# Patient Record
Sex: Female | Born: 1940 | Race: White | Hispanic: No | Marital: Married | Smoking: Former smoker
Health system: Southern US, Community
[De-identification: ages and names within clinical notes are randomized; demographics above are authoritative.]

## PROBLEM LIST (undated history)

## (undated) DIAGNOSIS — S065XAA Traumatic subdural hemorrhage with loss of consciousness status unknown, initial encounter: Secondary | ICD-10-CM

## (undated) DIAGNOSIS — E785 Hyperlipidemia, unspecified: Secondary | ICD-10-CM

## (undated) DIAGNOSIS — I4821 Permanent atrial fibrillation: Secondary | ICD-10-CM

## (undated) DIAGNOSIS — A0472 Enterocolitis due to Clostridium difficile, not specified as recurrent: Secondary | ICD-10-CM

## (undated) DIAGNOSIS — C329 Malignant neoplasm of larynx, unspecified: Secondary | ICD-10-CM

## (undated) DIAGNOSIS — C069 Malignant neoplasm of mouth, unspecified: Secondary | ICD-10-CM

## (undated) DIAGNOSIS — J189 Pneumonia, unspecified organism: Secondary | ICD-10-CM

## (undated) DIAGNOSIS — I639 Cerebral infarction, unspecified: Secondary | ICD-10-CM

## (undated) DIAGNOSIS — F1011 Alcohol abuse, in remission: Secondary | ICD-10-CM

## (undated) DIAGNOSIS — Z87440 Personal history of urinary (tract) infections: Secondary | ICD-10-CM

## (undated) DIAGNOSIS — I10 Essential (primary) hypertension: Secondary | ICD-10-CM

## (undated) DIAGNOSIS — I679 Cerebrovascular disease, unspecified: Secondary | ICD-10-CM

## (undated) DIAGNOSIS — F0390 Unspecified dementia without behavioral disturbance: Secondary | ICD-10-CM

## (undated) DIAGNOSIS — N870 Mild cervical dysplasia: Secondary | ICD-10-CM

## (undated) DIAGNOSIS — M2550 Pain in unspecified joint: Secondary | ICD-10-CM

## (undated) DIAGNOSIS — C109 Malignant neoplasm of oropharynx, unspecified: Secondary | ICD-10-CM

## (undated) DIAGNOSIS — Q438 Other specified congenital malformations of intestine: Secondary | ICD-10-CM

## (undated) DIAGNOSIS — N390 Urinary tract infection, site not specified: Secondary | ICD-10-CM

## (undated) DIAGNOSIS — C50919 Malignant neoplasm of unspecified site of unspecified female breast: Secondary | ICD-10-CM

## (undated) DIAGNOSIS — L0390 Cellulitis, unspecified: Secondary | ICD-10-CM

## (undated) DIAGNOSIS — R269 Unspecified abnormalities of gait and mobility: Secondary | ICD-10-CM

## (undated) DIAGNOSIS — F419 Anxiety disorder, unspecified: Secondary | ICD-10-CM

## (undated) DIAGNOSIS — K5730 Diverticulosis of large intestine without perforation or abscess without bleeding: Secondary | ICD-10-CM

## (undated) DIAGNOSIS — E039 Hypothyroidism, unspecified: Secondary | ICD-10-CM

## (undated) DIAGNOSIS — Z860100 Personal history of colon polyps, unspecified: Secondary | ICD-10-CM

## (undated) DIAGNOSIS — S065X9A Traumatic subdural hemorrhage with loss of consciousness of unspecified duration, initial encounter: Secondary | ICD-10-CM

## (undated) DIAGNOSIS — R569 Unspecified convulsions: Secondary | ICD-10-CM

## (undated) DIAGNOSIS — M2540 Effusion, unspecified joint: Secondary | ICD-10-CM

## (undated) DIAGNOSIS — M1990 Unspecified osteoarthritis, unspecified site: Secondary | ICD-10-CM

## (undated) DIAGNOSIS — I739 Peripheral vascular disease, unspecified: Secondary | ICD-10-CM

## (undated) HISTORY — DX: Essential (primary) hypertension: I10

## (undated) HISTORY — DX: Personal history of colon polyps, unspecified: Z86.0100

## (undated) HISTORY — DX: Diverticulosis of large intestine without perforation or abscess without bleeding: K57.30

## (undated) HISTORY — PX: SUBDURAL HEMATOMA EVACUATION VIA CRANIOTOMY: SUR319

## (undated) HISTORY — PX: BREAST LUMPECTOMY: SHX2

## (undated) HISTORY — PX: OTHER SURGICAL HISTORY: SHX169

## (undated) HISTORY — PX: MULTIPLE TOOTH EXTRACTIONS: SHX2053

## (undated) HISTORY — DX: Unspecified abnormalities of gait and mobility: R26.9

## (undated) HISTORY — DX: Mild cervical dysplasia: N87.0

## (undated) HISTORY — DX: Urinary tract infection, site not specified: N39.0

## (undated) HISTORY — DX: Traumatic subdural hemorrhage with loss of consciousness of unspecified duration, initial encounter: S06.5X9A

## (undated) HISTORY — DX: Hyperlipidemia, unspecified: E78.5

## (undated) HISTORY — DX: Hypothyroidism, unspecified: E03.9

## (undated) HISTORY — DX: Alcohol abuse, in remission: F10.11

## (undated) HISTORY — DX: Malignant neoplasm of unspecified site of unspecified female breast: C50.919

## (undated) HISTORY — PX: COLONOSCOPY: SHX174

## (undated) HISTORY — DX: Personal history of colonic polyps: Z86.010

## (undated) HISTORY — DX: Cerebrovascular disease, unspecified: I67.9

## (undated) HISTORY — DX: Other specified congenital malformations of intestine: Q43.8

## (undated) HISTORY — PX: CAROTID ENDARTERECTOMY: SUR193

## (undated) HISTORY — DX: Permanent atrial fibrillation: I48.21

## (undated) HISTORY — DX: Cerebral infarction, unspecified: I63.9

## (undated) HISTORY — DX: Malignant neoplasm of oropharynx, unspecified: C10.9

## (undated) HISTORY — PX: TUBAL LIGATION: SHX77

## (undated) HISTORY — DX: Traumatic subdural hemorrhage with loss of consciousness status unknown, initial encounter: S06.5XAA

## (undated) HISTORY — DX: Cellulitis, unspecified: L03.90

## (undated) HISTORY — DX: Malignant neoplasm of mouth, unspecified: C06.9

## (undated) HISTORY — PX: MASTECTOMY: SHX3

## (undated) HISTORY — DX: Pneumonia, unspecified organism: J18.9

## (undated) HISTORY — DX: Enterocolitis due to Clostridium difficile, not specified as recurrent: A04.72

## (undated) HISTORY — PX: CATARACT EXTRACTION, BILATERAL: SHX1313

## (undated) HISTORY — DX: Unspecified convulsions: R56.9

## (undated) HISTORY — DX: Peripheral vascular disease, unspecified: I73.9

---

## 1998-02-05 ENCOUNTER — Other Ambulatory Visit: Admission: RE | Admit: 1998-02-05 | Discharge: 1998-02-05 | Payer: Self-pay | Admitting: General Surgery

## 1998-02-25 ENCOUNTER — Encounter: Payer: Self-pay | Admitting: General Surgery

## 1998-02-27 ENCOUNTER — Ambulatory Visit (HOSPITAL_COMMUNITY): Admission: RE | Admit: 1998-02-27 | Discharge: 1998-02-28 | Payer: Self-pay | Admitting: General Surgery

## 1998-04-08 ENCOUNTER — Inpatient Hospital Stay (HOSPITAL_COMMUNITY): Admission: RE | Admit: 1998-04-08 | Discharge: 1998-04-09 | Payer: Self-pay | Admitting: General Surgery

## 1998-07-04 DIAGNOSIS — I679 Cerebrovascular disease, unspecified: Secondary | ICD-10-CM

## 1998-07-04 DIAGNOSIS — I639 Cerebral infarction, unspecified: Secondary | ICD-10-CM

## 1998-07-04 HISTORY — DX: Cerebrovascular disease, unspecified: I67.9

## 1998-07-04 HISTORY — DX: Cerebral infarction, unspecified: I63.9

## 1998-12-09 ENCOUNTER — Other Ambulatory Visit: Admission: RE | Admit: 1998-12-09 | Discharge: 1998-12-09 | Payer: Self-pay | Admitting: Specialist

## 1999-01-09 ENCOUNTER — Inpatient Hospital Stay (HOSPITAL_COMMUNITY): Admission: EM | Admit: 1999-01-09 | Discharge: 1999-01-25 | Payer: Self-pay | Admitting: Emergency Medicine

## 1999-01-09 ENCOUNTER — Encounter: Payer: Self-pay | Admitting: Emergency Medicine

## 1999-01-10 ENCOUNTER — Encounter: Payer: Self-pay | Admitting: Neurology

## 1999-01-19 ENCOUNTER — Encounter: Payer: Self-pay | Admitting: *Deleted

## 1999-05-21 ENCOUNTER — Encounter: Admission: RE | Admit: 1999-05-21 | Discharge: 1999-05-21 | Payer: Self-pay | Admitting: Hematology and Oncology

## 1999-05-21 ENCOUNTER — Encounter: Payer: Self-pay | Admitting: Hematology and Oncology

## 1999-07-06 ENCOUNTER — Other Ambulatory Visit: Admission: RE | Admit: 1999-07-06 | Discharge: 1999-07-06 | Payer: Self-pay | Admitting: *Deleted

## 1999-08-06 ENCOUNTER — Other Ambulatory Visit: Admission: RE | Admit: 1999-08-06 | Discharge: 1999-08-06 | Payer: Self-pay | Admitting: *Deleted

## 2000-07-13 ENCOUNTER — Other Ambulatory Visit: Admission: RE | Admit: 2000-07-13 | Discharge: 2000-07-13 | Payer: Self-pay | Admitting: *Deleted

## 2001-07-18 ENCOUNTER — Other Ambulatory Visit: Admission: RE | Admit: 2001-07-18 | Discharge: 2001-07-18 | Payer: Self-pay | Admitting: *Deleted

## 2001-12-19 ENCOUNTER — Encounter: Admission: RE | Admit: 2001-12-19 | Discharge: 2002-02-04 | Payer: Self-pay | Admitting: *Deleted

## 2002-07-24 ENCOUNTER — Other Ambulatory Visit: Admission: RE | Admit: 2002-07-24 | Discharge: 2002-07-24 | Payer: Self-pay | Admitting: *Deleted

## 2002-09-09 ENCOUNTER — Other Ambulatory Visit: Admission: RE | Admit: 2002-09-09 | Discharge: 2002-09-09 | Payer: Self-pay | Admitting: *Deleted

## 2003-01-20 ENCOUNTER — Other Ambulatory Visit: Admission: RE | Admit: 2003-01-20 | Discharge: 2003-01-20 | Payer: Self-pay | Admitting: *Deleted

## 2003-06-11 ENCOUNTER — Other Ambulatory Visit: Admission: RE | Admit: 2003-06-11 | Discharge: 2003-06-11 | Payer: Self-pay | Admitting: *Deleted

## 2003-08-20 ENCOUNTER — Other Ambulatory Visit: Admission: RE | Admit: 2003-08-20 | Discharge: 2003-08-20 | Payer: Self-pay | Admitting: *Deleted

## 2003-11-26 ENCOUNTER — Other Ambulatory Visit: Admission: RE | Admit: 2003-11-26 | Discharge: 2003-11-26 | Payer: Self-pay | Admitting: *Deleted

## 2004-03-10 ENCOUNTER — Other Ambulatory Visit: Admission: RE | Admit: 2004-03-10 | Discharge: 2004-03-10 | Payer: Self-pay | Admitting: *Deleted

## 2004-06-30 ENCOUNTER — Other Ambulatory Visit: Admission: RE | Admit: 2004-06-30 | Discharge: 2004-06-30 | Payer: Self-pay | Admitting: *Deleted

## 2004-07-07 ENCOUNTER — Encounter: Admission: RE | Admit: 2004-07-07 | Discharge: 2004-07-07 | Payer: Self-pay | Admitting: Internal Medicine

## 2004-11-09 ENCOUNTER — Ambulatory Visit: Admission: RE | Admit: 2004-11-09 | Discharge: 2005-02-07 | Payer: Self-pay | Admitting: Radiation Oncology

## 2004-11-17 ENCOUNTER — Encounter: Admission: AD | Admit: 2004-11-17 | Discharge: 2004-11-17 | Payer: Self-pay | Admitting: Dentistry

## 2004-11-17 ENCOUNTER — Ambulatory Visit: Payer: Self-pay | Admitting: Dentistry

## 2004-11-22 ENCOUNTER — Ambulatory Visit: Payer: Self-pay | Admitting: Dentistry

## 2004-12-03 ENCOUNTER — Ambulatory Visit: Payer: Self-pay | Admitting: Dentistry

## 2005-01-05 ENCOUNTER — Other Ambulatory Visit: Admission: RE | Admit: 2005-01-05 | Discharge: 2005-01-05 | Payer: Self-pay | Admitting: *Deleted

## 2005-01-06 ENCOUNTER — Ambulatory Visit: Payer: Self-pay | Admitting: Dentistry

## 2005-03-02 ENCOUNTER — Ambulatory Visit: Payer: Self-pay | Admitting: Dentistry

## 2005-03-02 ENCOUNTER — Ambulatory Visit: Admission: RE | Admit: 2005-03-02 | Discharge: 2005-04-05 | Payer: Self-pay | Admitting: Radiation Oncology

## 2005-03-02 ENCOUNTER — Ambulatory Visit: Admission: RE | Admit: 2005-03-02 | Discharge: 2005-03-08 | Payer: Self-pay | Admitting: Radiation Oncology

## 2005-07-06 ENCOUNTER — Encounter: Admission: RE | Admit: 2005-07-06 | Discharge: 2005-07-06 | Payer: Self-pay | Admitting: General Surgery

## 2005-07-26 ENCOUNTER — Other Ambulatory Visit: Admission: RE | Admit: 2005-07-26 | Discharge: 2005-07-26 | Payer: Self-pay | Admitting: *Deleted

## 2005-07-29 ENCOUNTER — Ambulatory Visit: Payer: Self-pay | Admitting: Oncology

## 2005-09-06 ENCOUNTER — Ambulatory Visit (HOSPITAL_COMMUNITY): Admission: RE | Admit: 2005-09-06 | Discharge: 2005-09-06 | Payer: Self-pay | Admitting: General Surgery

## 2005-09-06 ENCOUNTER — Encounter (INDEPENDENT_AMBULATORY_CARE_PROVIDER_SITE_OTHER): Payer: Self-pay | Admitting: Specialist

## 2005-09-14 ENCOUNTER — Ambulatory Visit: Payer: Self-pay | Admitting: Oncology

## 2005-09-22 ENCOUNTER — Ambulatory Visit: Admission: RE | Admit: 2005-09-22 | Discharge: 2005-12-08 | Payer: Self-pay | Admitting: Radiation Oncology

## 2005-11-02 ENCOUNTER — Encounter: Payer: Self-pay | Admitting: General Surgery

## 2005-12-15 ENCOUNTER — Ambulatory Visit: Payer: Self-pay | Admitting: Oncology

## 2006-06-19 ENCOUNTER — Ambulatory Visit: Payer: Self-pay | Admitting: Oncology

## 2006-07-31 ENCOUNTER — Other Ambulatory Visit: Admission: RE | Admit: 2006-07-31 | Discharge: 2006-07-31 | Payer: Self-pay | Admitting: *Deleted

## 2006-09-27 ENCOUNTER — Ambulatory Visit: Payer: Self-pay | Admitting: Internal Medicine

## 2006-10-16 ENCOUNTER — Ambulatory Visit: Payer: Self-pay | Admitting: Internal Medicine

## 2006-10-16 LAB — CONVERTED CEMR LAB
Free T4: 1.1 ng/dL (ref 0.6–1.6)
Phenytoin Lvl: 9.2 ug/mL — ABNORMAL LOW (ref 10.0–20.0)
TSH: 1.42 microintl units/mL (ref 0.35–5.50)

## 2006-10-23 ENCOUNTER — Ambulatory Visit: Payer: Self-pay | Admitting: Gastroenterology

## 2006-10-30 ENCOUNTER — Ambulatory Visit: Payer: Self-pay | Admitting: Gastroenterology

## 2006-10-30 ENCOUNTER — Encounter (INDEPENDENT_AMBULATORY_CARE_PROVIDER_SITE_OTHER): Payer: Self-pay | Admitting: *Deleted

## 2006-11-07 ENCOUNTER — Ambulatory Visit: Payer: Self-pay | Admitting: Vascular Surgery

## 2007-01-31 ENCOUNTER — Other Ambulatory Visit: Admission: RE | Admit: 2007-01-31 | Discharge: 2007-01-31 | Payer: Self-pay | Admitting: *Deleted

## 2007-03-20 ENCOUNTER — Ambulatory Visit: Payer: Self-pay | Admitting: Oncology

## 2007-04-04 ENCOUNTER — Ambulatory Visit: Payer: Self-pay | Admitting: Internal Medicine

## 2007-05-15 ENCOUNTER — Encounter: Payer: Self-pay | Admitting: Internal Medicine

## 2007-06-23 ENCOUNTER — Encounter: Payer: Self-pay | Admitting: *Deleted

## 2007-06-23 DIAGNOSIS — Z8679 Personal history of other diseases of the circulatory system: Secondary | ICD-10-CM | POA: Insufficient documentation

## 2007-06-23 DIAGNOSIS — I10 Essential (primary) hypertension: Secondary | ICD-10-CM

## 2007-06-23 DIAGNOSIS — E785 Hyperlipidemia, unspecified: Secondary | ICD-10-CM

## 2007-06-23 DIAGNOSIS — Z8669 Personal history of other diseases of the nervous system and sense organs: Secondary | ICD-10-CM | POA: Insufficient documentation

## 2007-06-23 DIAGNOSIS — F1021 Alcohol dependence, in remission: Secondary | ICD-10-CM | POA: Insufficient documentation

## 2007-06-23 DIAGNOSIS — Z9849 Cataract extraction status, unspecified eye: Secondary | ICD-10-CM

## 2007-06-23 DIAGNOSIS — R279 Unspecified lack of coordination: Secondary | ICD-10-CM | POA: Insufficient documentation

## 2007-06-23 DIAGNOSIS — E039 Hypothyroidism, unspecified: Secondary | ICD-10-CM

## 2007-06-23 DIAGNOSIS — Z9889 Other specified postprocedural states: Secondary | ICD-10-CM | POA: Insufficient documentation

## 2007-06-23 DIAGNOSIS — R32 Unspecified urinary incontinence: Secondary | ICD-10-CM

## 2007-06-23 DIAGNOSIS — Z860100 Personal history of colon polyps, unspecified: Secondary | ICD-10-CM | POA: Insufficient documentation

## 2007-06-23 DIAGNOSIS — Z853 Personal history of malignant neoplasm of breast: Secondary | ICD-10-CM

## 2007-06-23 DIAGNOSIS — N870 Mild cervical dysplasia: Secondary | ICD-10-CM | POA: Insufficient documentation

## 2007-06-23 DIAGNOSIS — I739 Peripheral vascular disease, unspecified: Secondary | ICD-10-CM

## 2007-06-23 DIAGNOSIS — Z87448 Personal history of other diseases of urinary system: Secondary | ICD-10-CM | POA: Insufficient documentation

## 2007-07-11 ENCOUNTER — Telehealth: Payer: Self-pay | Admitting: Internal Medicine

## 2007-07-18 ENCOUNTER — Encounter: Payer: Self-pay | Admitting: Internal Medicine

## 2007-07-19 ENCOUNTER — Encounter: Payer: Self-pay | Admitting: Internal Medicine

## 2007-07-23 ENCOUNTER — Encounter: Payer: Self-pay | Admitting: Internal Medicine

## 2007-07-24 ENCOUNTER — Telehealth: Payer: Self-pay | Admitting: Internal Medicine

## 2007-08-01 ENCOUNTER — Other Ambulatory Visit: Admission: RE | Admit: 2007-08-01 | Discharge: 2007-08-01 | Payer: Self-pay | Admitting: *Deleted

## 2007-09-12 ENCOUNTER — Encounter: Payer: Self-pay | Admitting: Internal Medicine

## 2007-10-19 ENCOUNTER — Telehealth: Payer: Self-pay | Admitting: Internal Medicine

## 2007-10-19 ENCOUNTER — Ambulatory Visit: Payer: Self-pay | Admitting: Internal Medicine

## 2007-10-19 DIAGNOSIS — Z8719 Personal history of other diseases of the digestive system: Secondary | ICD-10-CM

## 2007-10-19 LAB — CONVERTED CEMR LAB
Basophils Absolute: 0 10*3/uL (ref 0.0–0.1)
Basophils Relative: 0.1 % (ref 0.0–1.0)
CO2: 33 meq/L — ABNORMAL HIGH (ref 19–32)
Chloride: 96 meq/L (ref 96–112)
Cholesterol: 156 mg/dL (ref 0–200)
Creatinine, Ser: 0.7 mg/dL (ref 0.4–1.2)
Glucose, Bld: 98 mg/dL (ref 70–99)
Hemoglobin: 14.2 g/dL (ref 12.0–15.0)
LDL Cholesterol: 56 mg/dL (ref 0–99)
Lymphocytes Relative: 12.3 % (ref 12.0–46.0)
MCHC: 33.2 g/dL (ref 30.0–36.0)
Monocytes Relative: 8.7 % (ref 3.0–12.0)
Neutro Abs: 6.7 10*3/uL (ref 1.4–7.7)
Neutrophils Relative %: 78.1 % — ABNORMAL HIGH (ref 43.0–77.0)
Phenytoin Lvl: 25.5 ug/mL — ABNORMAL HIGH (ref 10.0–20.0)
RBC: 4.41 M/uL (ref 3.87–5.11)
Sodium: 134 meq/L — ABNORMAL LOW (ref 135–145)
Total CHOL/HDL Ratio: 1.7
VLDL: 8 mg/dL (ref 0–40)
Valproic Acid Lvl: 6.7 ug/mL — ABNORMAL LOW (ref 50.0–100.0)
WBC: 8.5 10*3/uL (ref 4.5–10.5)

## 2007-10-20 ENCOUNTER — Encounter: Payer: Self-pay | Admitting: Internal Medicine

## 2007-10-20 DIAGNOSIS — Z85819 Personal history of malignant neoplasm of unspecified site of lip, oral cavity, and pharynx: Secondary | ICD-10-CM | POA: Insufficient documentation

## 2007-11-14 ENCOUNTER — Encounter: Payer: Self-pay | Admitting: Internal Medicine

## 2007-11-27 ENCOUNTER — Ambulatory Visit: Payer: Self-pay | Admitting: Oncology

## 2008-01-21 ENCOUNTER — Encounter: Payer: Self-pay | Admitting: Internal Medicine

## 2008-03-28 ENCOUNTER — Ambulatory Visit: Payer: Self-pay | Admitting: Internal Medicine

## 2008-04-02 ENCOUNTER — Other Ambulatory Visit: Admission: RE | Admit: 2008-04-02 | Discharge: 2008-04-02 | Payer: Self-pay | Admitting: Gynecology

## 2008-04-30 ENCOUNTER — Encounter: Payer: Self-pay | Admitting: Internal Medicine

## 2008-06-18 ENCOUNTER — Telehealth: Payer: Self-pay | Admitting: Internal Medicine

## 2008-06-26 ENCOUNTER — Encounter: Payer: Self-pay | Admitting: Internal Medicine

## 2008-07-21 ENCOUNTER — Encounter: Payer: Self-pay | Admitting: Internal Medicine

## 2008-07-25 ENCOUNTER — Ambulatory Visit: Payer: Self-pay | Admitting: Oncology

## 2008-09-05 ENCOUNTER — Encounter: Payer: Self-pay | Admitting: Internal Medicine

## 2008-09-12 ENCOUNTER — Encounter: Payer: Self-pay | Admitting: Internal Medicine

## 2008-10-27 ENCOUNTER — Ambulatory Visit: Payer: Self-pay | Admitting: Internal Medicine

## 2008-10-27 LAB — CONVERTED CEMR LAB
ALT: 20 units/L (ref 0–35)
Albumin: 4.2 g/dL (ref 3.5–5.2)
Alkaline Phosphatase: 84 units/L (ref 39–117)
BUN: 6 mg/dL (ref 6–23)
Bilirubin, Direct: 0.1 mg/dL (ref 0.0–0.3)
Calcium: 9.6 mg/dL (ref 8.4–10.5)
Cholesterol: 198 mg/dL (ref 0–200)
Creatinine, Ser: 0.7 mg/dL (ref 0.4–1.2)
GFR calc non Af Amer: 87.85 mL/min (ref >60)
Phenobarbital: 44.9 ug/mL — ABNORMAL HIGH (ref 15.0–40.0)
TSH: 1.18 microintl units/mL (ref 0.35–5.50)
Total Protein: 7.8 g/dL (ref 6.0–8.3)
Triglycerides: 63 mg/dL (ref 0.0–149.0)

## 2008-11-12 ENCOUNTER — Encounter: Payer: Self-pay | Admitting: Internal Medicine

## 2009-02-27 ENCOUNTER — Telehealth: Payer: Self-pay | Admitting: Internal Medicine

## 2009-03-02 ENCOUNTER — Telehealth: Payer: Self-pay | Admitting: Internal Medicine

## 2009-04-14 ENCOUNTER — Ambulatory Visit: Payer: Self-pay | Admitting: Internal Medicine

## 2009-04-16 ENCOUNTER — Ambulatory Visit: Payer: Self-pay | Admitting: Internal Medicine

## 2009-04-23 ENCOUNTER — Ambulatory Visit: Payer: Self-pay | Admitting: Oncology

## 2009-04-30 ENCOUNTER — Telehealth: Payer: Self-pay | Admitting: Internal Medicine

## 2009-05-13 ENCOUNTER — Encounter: Payer: Self-pay | Admitting: Internal Medicine

## 2009-06-22 ENCOUNTER — Telehealth: Payer: Self-pay | Admitting: Internal Medicine

## 2009-06-22 DIAGNOSIS — J309 Allergic rhinitis, unspecified: Secondary | ICD-10-CM | POA: Insufficient documentation

## 2009-07-22 ENCOUNTER — Encounter: Payer: Self-pay | Admitting: Internal Medicine

## 2009-09-10 ENCOUNTER — Encounter (INDEPENDENT_AMBULATORY_CARE_PROVIDER_SITE_OTHER): Payer: Self-pay | Admitting: *Deleted

## 2009-09-14 ENCOUNTER — Telehealth: Payer: Self-pay | Admitting: Internal Medicine

## 2009-09-22 ENCOUNTER — Telehealth: Payer: Self-pay | Admitting: Internal Medicine

## 2009-09-29 ENCOUNTER — Telehealth: Payer: Self-pay | Admitting: Internal Medicine

## 2009-10-26 ENCOUNTER — Ambulatory Visit: Payer: Self-pay | Admitting: Oncology

## 2009-10-28 ENCOUNTER — Encounter (INDEPENDENT_AMBULATORY_CARE_PROVIDER_SITE_OTHER): Payer: Self-pay | Admitting: *Deleted

## 2009-10-30 ENCOUNTER — Ambulatory Visit: Payer: Self-pay | Admitting: Gastroenterology

## 2009-10-30 ENCOUNTER — Ambulatory Visit: Payer: Self-pay | Admitting: Internal Medicine

## 2009-11-09 ENCOUNTER — Ambulatory Visit: Payer: Self-pay | Admitting: Gastroenterology

## 2009-11-10 ENCOUNTER — Telehealth: Payer: Self-pay | Admitting: Internal Medicine

## 2009-11-13 ENCOUNTER — Telehealth: Payer: Self-pay | Admitting: Internal Medicine

## 2009-11-14 ENCOUNTER — Encounter: Payer: Self-pay | Admitting: Internal Medicine

## 2009-11-16 ENCOUNTER — Encounter: Payer: Self-pay | Admitting: Internal Medicine

## 2009-12-02 ENCOUNTER — Encounter: Payer: Self-pay | Admitting: Internal Medicine

## 2009-12-23 ENCOUNTER — Encounter: Payer: Self-pay | Admitting: Internal Medicine

## 2010-03-26 ENCOUNTER — Ambulatory Visit: Payer: Self-pay | Admitting: Internal Medicine

## 2010-04-05 ENCOUNTER — Encounter: Payer: Self-pay | Admitting: Internal Medicine

## 2010-04-07 ENCOUNTER — Telehealth: Payer: Self-pay | Admitting: Internal Medicine

## 2010-04-07 ENCOUNTER — Encounter: Payer: Self-pay | Admitting: Internal Medicine

## 2010-04-22 ENCOUNTER — Ambulatory Visit: Payer: Self-pay | Admitting: Oncology

## 2010-06-15 ENCOUNTER — Telehealth: Payer: Self-pay | Admitting: Internal Medicine

## 2010-06-23 ENCOUNTER — Encounter: Payer: Self-pay | Admitting: Internal Medicine

## 2010-07-20 ENCOUNTER — Telehealth: Payer: Self-pay | Admitting: Internal Medicine

## 2010-07-22 ENCOUNTER — Telehealth: Payer: Self-pay | Admitting: Internal Medicine

## 2010-07-28 ENCOUNTER — Encounter: Payer: Self-pay | Admitting: Internal Medicine

## 2010-07-30 ENCOUNTER — Telehealth: Payer: Self-pay | Admitting: Internal Medicine

## 2010-08-03 NOTE — Progress Notes (Signed)
Summary: Phenobarb refill  Phone Note Refill Request Message from:  Fax from Pharmacy on Nov 13, 2009 4:43 PM  Refills Requested: Medication #1:  PHENOBARBITAL 97.2 MG  TABS Take 1 tablet by mouth two times a day   Notes: [facility] Initial call taken by: Lucious Groves,  Nov 13, 2009 4:44 PM  Follow-up for Phone Call        ok for refill - 90 day 3 refills Follow-up by: Jacques Navy MD,  Nov 16, 2009 5:02 AM    Prescriptions: PHENOBARBITAL 97.2 MG  TABS (PHENOBARBITAL) Take 1 tablet by mouth two times a day  #180 x 1   Entered by:   Lamar Sprinkles, CMA   Authorized by:   Jacques Navy MD   Signed by:   Lamar Sprinkles, CMA on 11/16/2009   Method used:   Printed then faxed to ...       [facility] (mail-order)             , [place]         Ph: [phone]       Fax: [phone]   RxID:   [id number]

## 2010-08-03 NOTE — Letter (Signed)
Summary: Otolaryngology/[facility]  Otolaryngology/[facility]   Imported By: Sherian Rein 04/09/2010 08:15:19  _____________________________________________________________________  External Attachment:    Type:   Image     Comment:   External Document

## 2010-08-03 NOTE — Medication Information (Signed)
Summary: Refill for Phenobarb/[facility]  Refill for Phenobarb/[facility]   Imported By: Sherian Rein 04/13/2010 11:14:15  _____________________________________________________________________  External Attachment:    Type:   Image     Comment:   External Document

## 2010-08-03 NOTE — Procedures (Signed)
Summary: Colonoscopy  Patient: [name] Note: All result statuses are Final unless otherwise noted.  Tests: (1) Colonoscopy (COL)   COL Colonoscopy           DONE     [facility]     [address]     [place], [place]  [address]           COLONOSCOPY PROCEDURE REPORT           PATIENT:  [name], [name]  MR#:  [id number]     BIRTHDATE:  1940-12-10, 69 yrs. old  GENDER:  female     ENDOSCOPIST:  Vania Rea. Jarold Motto, MD, [facility]     REF. BY:     PROCEDURE DATE:  11/09/2009     PROCEDURE:  Surveillance Colonoscopy     ASA CLASS:  Class III     INDICATIONS:  history of pre-cancerous (adenomatous) colon polyps           MEDICATIONS:   Fentanyl 50 mcg IV, Versed 5 mg IV, Atropine 0.5 mg     IV           DESCRIPTION OF PROCEDURE:   After the risks benefits and     alternatives of the procedure were thoroughly explained, informed     consent was obtained.  Digital rectal exam was performed and     revealed no abnormalities.   The LB PCF-H180AL [id number] endoscope     was introduced through the anus and advanced to the cecum, which     was identified by both the appendix and ileocecal valve, limited     by a redundant colon.  INITIAL EXAM.BRADYCARDIA AND TRANSIENT     HYPOTENSION RESPONDED TO.5MG  IV ATROPINE.IMMEDIATE RETURN TO     NORMAL BP AND PULSE.EXTUBATED AND RESCOPPED WITH PEDIATRIC SCOPE     WITHOUT DIFFICULTY TO THE CECUM.  The quality of the prep was     excellent, using MoviPrep.  The instrument was then slowly     withdrawn as the colon was fully examined.     <<PROCEDUREIMAGES>>           FINDINGS:  No polyps or cancers were seen.  This was otherwise a     normal examination of the colon.   Retroflexed views in the rectum     revealed no abnormalities.    The scope was then withdrawn from     the patient and the procedure completed.           COMPLICATIONS:  None     ENDOSCOPIC IMPRESSION:     1) No polyps or cancers     2) Otherwise normal examination     RECOMMENDATIONS:     1) Follow up colonoscopy in 5 years     REPEAT EXAM:  No           ______________________________     Vania Rea. Jarold Motto, MD, [facility]           CC:  Jacques Navy, MD           n.     Rosalie DoctorMarland Kitchen   Vania Rea. Kida Digiulio at 11/09/2009 11:00 AM           Johnnye Lana, [phone]  Note: An exclamation mark (!) indicates a result that was not dispersed into the flowsheet. Document Creation Date: 11/09/2009 11:01 AM _______________________________________________________________________  (1) Order result status: Final Collection or observation date-time: 11/09/2009 10:54 Requested date-time:  Receipt date-time:  Reported date-time:  Referring Physician:   Ordering Physician: Sheryn Bison [phone]) Specimen Source:  Source: Launa Grill Order Number: [id number] Lab site:   Appended Document: Colonoscopy    Clinical Lists Changes  Observations: Added new observation of COLONNXTDUE: 11/2014 (11/09/2009 13:44)

## 2010-08-03 NOTE — Progress Notes (Signed)
  Phone Note Refill Request Message from:  Fax from Pharmacy on April 07, 2010 1:30 PM  Refills Requested: Medication #1:  PHENOBARBITAL 97.2 MG  TABS Take 1 tablet by mouth two times a day Please Advise refill, [facility]   Initial call taken by: Ami Bullins CMA,  April 07, 2010 1:31 PM  Follow-up for Phone Call        ok for refill prn Follow-up by: Jacques Navy MD,  April 07, 2010 2:43 PM  Additional Follow-up for Phone Call Additional follow up Details #1::        prescription signed and [facility]ed [facility] Additional Follow-up by: Ami Bullins CMA,  April 08, 2010 4:18 PM    Prescriptions: PHENOBARBITAL 97.2 MG  TABS (PHENOBARBITAL) Take 1 tablet by mouth two times a day  #180 x 1   Entered by:   Ami Bullins CMA   Authorized by:   Jacques Navy MD   Signed by:   Bill Salinas CMA on 04/07/2010   Method used:   Printed then faxed to ...       [facility] (mail-order)             , [place]         Ph: [phone]       Fax: [phone]   RxID:   [id number]

## 2010-08-03 NOTE — Progress Notes (Signed)
  Phone Note Refill Request Message from:  Fax from Pharmacy on September 22, 2009 10:14 AM  Refills Requested: Medication #1:  ALENDRONATE SODIUM 70 MG  TABS 1 weekly Initial call taken by: Ami Bullins CMA,  September 22, 2009 10:14 AM    Prescriptions: ALENDRONATE SODIUM 70 MG  TABS (ALENDRONATE SODIUM) 1 weekly  #12 x 3   Entered by:   Ami Bullins CMA   Authorized by:   Jacques Navy MD   Signed by:   Bill Salinas CMA on 09/22/2009   Method used:   Electronically to        [facility]* (retail)       [address] [place], [place]  [address]       Ph: [phone]       Fax: [phone]   RxID:   [id number]

## 2010-08-03 NOTE — Assessment & Plan Note (Signed)
Summary: YEARLY FU/ MAY NEED RX TO DO LABS ELSEWHERE/NWS  #   Vital Signs:  Patient profile:   70 year old female Height:      63 inches Weight:      127 pounds BMI:     22.58 O2 Sat:      95 % on Room air Temp:     97.7 degrees F oral Pulse rate:   73 / minute BP sitting:   136 / 68  (left arm) Cuff size:   large  Vitals Entered By: Bill Salinas CMA (October 30, 2009 1:41 PM)  O2 Flow:  Room air CC: pt here for cpx/ ab  Vision Screening:      Vision Comments: Pt had eye exam April 2011 with Dr Jethro Bolus, no change in her eyes   Primary Care Provider:  [facility]  CC:  pt here for cpx/ ab.  History of Present Illness: Patient presents for rouitne medical care. She has just had an eye exam- normal exam (Dr. Nile Riggs). She has had a mammogram. In the interval no new illness, no surgeries no injuries. She is due for routine lab and prefers to use an off-site lab due to insurance.   Current Medications (verified): 1)  Phenobarbital 97.2 Mg  Tabs (Phenobarbital) .... Take 1 Tablet By Mouth Two Times A Day 2)  Tamoxifen Citrate 20 Mg  Tabs (Tamoxifen Citrate) .... Take One Tablet Once Daily 3)  Levothroid 50 Mcg  Tabs (Levothyroxine Sodium) .... Take One Tablet Once Daily 4)  Folic Acid 1 Mg  Tabs (Folic Acid) .... Take One Tablet Once Daily 5)  Simvastatin 80 Mg  Tabs (Simvastatin) .... Take One Tablet Once Daily 6)  Phenytoin Sodium Extended 100 Mg  Caps (Phenytoin Sodium Extended) .... Take 3 Tablets At Bedtime 7)  Alendronate Sodium 70 Mg  Tabs (Alendronate Sodium) .Marland Kitchen.. 1 Weekly 8)  Darvocet-N 100 100-650 Mg Tabs (Propoxyphene N-Apap) .Marland Kitchen.. 1 Q 6 As Needed Pain 9)  Moviprep 100 Gm  Solr (Peg-Kcl-Nacl-Nasulf-Na Asc-C) .... As Per Prep Instructions.  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) ALCOHOL ABUSE, HX OF (ICD-V11.3) SCC LEFT FLOOR OF THE MOUTH  Past Surgical History: Last updated: Nov 17, 2007 * CRANIOTOMY FOR EVACUATION OF SUBDURAL HEMATOMA * 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 TEETH EXTRACTION, HX OF FULL FOR ORAL CANCER (ICD-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) DILATION AND CURETTAGE, HX OF (ICD-V45.89) CATARACT EXTRACTIONS, BILATERAL, HX OF (ICD-V45.61) * LEFT BREAST LUMPECTOMY WITH RADIATION THERAPY CAROTID ENDARTERECTOMY, LEFT, HX OF (ICD-V15.1) MASTECTOMY, RIGHT, HX OF WITH NODE DISSECTION (ICD-V45.71)   p2g2    Family History: Last updated: 11/17/07 father-deceased @78  -  mother - deceased @ 46 CAD, SSS w/ pacemaker  aunt - breast cancer sister - deceased CVA, DM Neg - colon cancer  Social History: Last updated: 10/27/2008 HSG Married x 7 years divorced; married 2067/12/12 1 son - died as neonate, 1 son - 12-12-58 retired-worked as a Sales executive end-of-life: has a living will - does not want futile/heroic care; no CPR Marriage - reports marriage is in good health (4/10)  Will be working out at [employer].  Risk Factors: Alcohol Use: 1 (10/27/2008) Caffeine Use: 3 (10/27/2008) Exercise: no (10/19/2007)  Risk Factors: Smoking Status: quit (10/27/2008)  Review of Systems       The patient complains of weight loss.  The patient denies anorexia, fever, vision loss, decreased hearing, hoarseness, chest pain, syncope, dyspnea on exertion, peripheral edema, prolonged cough, abdominal pain, melena, hematochezia, severe indigestion/heartburn, incontinence, muscle weakness,  suspicious skin lesions, difficulty walking, depression, abnormal bleeding, and breast masses.    Physical Exam  General:  WNWD white female in no distress Head:  normocephalic and atraumatic.   Eyes:  vision grossly intact, pupils equal, pupils round, and corneas and lenses clear.  Fundi exam deferred to opthal Ears:  Cerumen bilaterally without obstruction Nose:  no external deformity.   Mouth:  lower denture, upper partial, no buccal lesions, posterior pharynx clear Neck:  supple.  Well healed surgical scar right-anterior neck. Radiation changes on the left- with skin changes Chest Wall:  no deformities and no tenderness.   Breasts:  deferred to Dr. Alcide Evener Lungs:  normal respiratory effort, no intercostal retractions, no accessory muscle use, normal breath sounds, no crackles, and no wheezes.   Heart:  normal rate, regular rhythm, no murmur, and no JVD.   Abdomen:  soft, non-tender, normal bowel sounds, no distention, no guarding, no rigidity, and no hepatomegaly.   Genitalia:  deferred due to age Msk:  normal ROM, no joint tenderness, no joint swelling, no joint warmth, and no redness over joints.  Subluxation of the left thumb noted; minimal subluxation right thumb Pulses:  2+ radial, 1+ DP  Extremities:  1+ pedal edema right foot.  Neurologic:  alert & oriented X3, cranial nerves II-XII intact, and DTRs symmetrical and normal.  Broad based stance a gait, short-stepped gait.  Skin:  turgor normal, color normal, and no suspicious lesions.  Spider veins noted at both ankles.  Cervical Nodes:  no anterior cervical adenopathy and no posterior cervical adenopathy.  Skin changes left neck inhibit exam.  Psych:  Oriented X3, good eye contact, and not anxious appearing.     Impression & Recommendations:  Problem # 1:  ALLERGIC RHINITIS CAUSE UNSPECIFIED (ICD-477.9) stable with symptoms under control  Problem # 2:  ATAXIA (ICD-781.3) noted to have a stable gait at today's  visit.  Problem # 3:  SEIZURES, HX OF (ICD-V12.49) No events reported. She is continuing to take anti-seizure medications without ill effects.  Problem # 4:  HYPOTHYROIDISM (ICD-244.9) For routine lab follow-up with recommendations to follow. She prefers to use an outside lab - results will be delayed.   Her updated medication list for this problem includes:    Levothroid 50 Mcg Tabs (Levothyroxine sodium) .Marland Kitchen... Take one tablet once daily  Problem # 5:  HYPERLIPIDEMIA (ICD-272.4) Due for follow-up labs with recommendations to follow. Outside lab is being used at her request.  Her updated medication list for this problem includes:    Simvastatin 80 Mg Tabs (Simvastatin) .Marland Kitchen... Take one tablet once daily  Problem # 6:  HYPERTENSION (ICD-401.9)  BP today: 136/68 Prior BP: 160/100 (10/27/2008)  Labs Reviewed: K+: 4.4 (10/27/2008) Creat: : 0.7 (10/27/2008)   Chol: 198 (10/27/2008)   HDL: 116.00 (10/27/2008)   LDL: 69 (10/27/2008)   TG: 63.0 (10/27/2008)  Adequate control on present regimen.  Problem # 7:  Preventive Health Care (ICD-V70.0) patinet's exam is unremarkable. She seems to being doing well. She by report has some paranoid ideation but seems  very cogent at today exam. Physical exam is stable without abnormality. Labs are ordered and pending. She is current with mammography and colorectal cancer screening vi colonoscopy. She is current with tetnus and pneumonia immunizaton.   In summary -a pleasant woman who appears medically stable descite a very complex medical history. She will be notified of lab results when they are received. She will return as needed or 1 year.   Complete Medication List: 1)  Phenobarbital 97.2 Mg Tabs (Phenobarbital) .... Take 1 tablet by mouth two times a day 2)  Tamoxifen Citrate 20 Mg Tabs (Tamoxifen citrate) .... Take one tablet once daily 3)  Levothroid 50 Mcg Tabs (Levothyroxine sodium) .... Take one tablet once daily 4)  Folic Acid 1 Mg Tabs  (Folic acid) .... Take one tablet once daily 5)  Simvastatin 80 Mg Tabs (Simvastatin) .... Take one tablet once daily 6)  Phenytoin Sodium Extended 100 Mg Caps (Phenytoin sodium extended) .... Take 3 tablets at bedtime 7)  Alendronate Sodium 70 Mg Tabs (Alendronate sodium) .Marland KitchenMarland KitchenMarland Kitchen 1 weekly 8)  Darvocet-n 100 100-650 Mg Tabs (Propoxyphene n-apap) .Marland Kitchen.. 1 q 6 as needed pain 9)  Moviprep 100 Gm Solr (Peg-kcl-nacl-nasulf-na asc-c) .... As per prep instructions.  Other Orders: Tdap => 51yrs IM (62952) Pneumococcal Vaccine (84132) Admin 1st Vaccine (44010) Admin of Any Addtl Vaccine (27253)   Preventive Care Screening  Mammogram:    Date:  07/22/2009    Results:  normal     Immunizations Administered:  Tetanus Vaccine:    Vaccine Type: Tdap    Site: left deltoid    Mfr: Sanofi Pasteur    Dose: 0.5 ml    Route: IM    Given by: Ami Bullins CMA    Exp. Date: 05/19/2011    Lot #: [id number]    VIS given: 05/22/07 version given October 30, 2009.  Pneumonia Vaccine:    Vaccine Type: Pneumovax    Site: left deltoid    Mfr: Merck    Dose: 0.5 ml    Route: IM    Given by: Ami Bullins CMA    Exp. Date: 02/13/2011    Lot #: [id number]    VIS given: 01/30/96 version given October 30, 2009.

## 2010-08-03 NOTE — Letter (Signed)
Summary: [facility]   Imported By: Lester [place] 11/26/2009 09:02:48  _____________________________________________________________________  External Attachment:    Type:   Image     Comment:   External Document

## 2010-08-03 NOTE — Progress Notes (Signed)
  Phone Note Refill Request   Refills Requested: Medication #1:  FOLIC ACID 1 MG  TABS Take one tablet once daily Initial call taken by: Rock Nephew CMA,  Nov 10, 2009 11:31 AM    Prescriptions: FOLIC ACID 1 MG  TABS (FOLIC ACID) Take one tablet once daily  #90 x 3   Entered by:   Rock Nephew CMA   Authorized by:   Jacques Navy MD   Signed by:   Rock Nephew CMA on 11/10/2009   Method used:   Faxed to ...       [facility] (mail-order)             , [place]         Ph: [phone]       Fax: [phone]   RxID:   [id number]

## 2010-08-03 NOTE — Assessment & Plan Note (Signed)
Summary: FLU / MEN /NWS   Nurse Visit   Allergies: No Known Drug Allergies  Orders Added: 1)  Flu Vaccine 68yrs + MEDICARE PATIENTS [Q2039] 2)  Administration Flu vaccine - MCR [G0008]      Flu Vaccine Consent Questions     Do you have a history of severe allergic reactions to this vaccine? no    Any prior history of allergic reactions to egg and/or gelatin? no    Do you have a sensitivity to the preservative Thimersol? no    Do you have a past history of Guillan-Barre Syndrome? no    Do you currently have an acute febrile illness? no    Have you ever had a severe reaction to latex? no    Vaccine information given and explained to patient? yes    Are you currently pregnant? no    Lot Number:[id number]   Exp Date:01/01/2011   Site Given  Left Deltoid IMu

## 2010-08-03 NOTE — Letter (Signed)
Summary: [facility] Instructions  [facility]  [address] [place], [place] [address]   Phone: [phone]  Fax: [phone]       [name]    72-13-1942    MRN: [id number]        Procedure Day /Date:  Monday 11/09/2009     Arrival Time: 9:30 am      Procedure Time: 10:30 am     Location of Procedure:                    _x _   Endoscopy Center (4th Floor)                        PREPARATION FOR COLONOSCOPY WITH MOVIPREP   Starting 5 days prior to your procedure Wednesday 5/4 do not eat nuts, seeds, popcorn, corn, beans, peas,  salads, or any raw vegetables.  Do not take any fiber supplements (e.g. Metamucil, Citrucel, and Benefiber).  THE DAY BEFORE YOUR PROCEDURE         DATE: Sunday 5/8 1.  Drink clear liquids the entire day-NO SOLID FOOD  2.  Do not drink anything colored red or purple.  Avoid juices with pulp.  No orange juice.  3.  Drink at least 64 oz. (8 glasses) of fluid/clear liquids during the day to prevent dehydration and help the prep work efficiently.  CLEAR LIQUIDS INCLUDE: Water Jello Ice Popsicles Tea (sugar ok, no milk/cream) Powdered fruit flavored drinks Coffee (sugar ok, no milk/cream) Gatorade Juice: apple, white grape, white cranberry  Lemonade Clear bullion, consomm, broth Carbonated beverages (any kind) Strained chicken noodle soup Hard Candy                             4.  In the morning, mix first dose of MoviPrep solution:    Empty 1 Pouch A and 1 Pouch B into the disposable container    Add lukewarm drinking water to the top line of the container. Mix to dissolve    Refrigerate (mixed solution should be used within 24 hrs)  5.  Begin drinking the prep at 5:00 p.m. The MoviPrep container is divided by 4 marks.   Every 15 minutes drink the solution down to the next mark (approximately 8 oz) until the full liter is complete.   6.  Follow completed prep with 16 oz of clear liquid of your choice (Nothing  red or purple).  Continue to drink clear liquids until bedtime.  7.  Before going to bed, mix second dose of MoviPrep solution:    Empty 1 Pouch A and 1 Pouch B into the disposable container    Add lukewarm drinking water to the top line of the container. Mix to dissolve    Refrigerate  THE DAY OF YOUR PROCEDURE      DATE: Monday 5/9  Beginning at 5:30 a.m. (5 hours before procedure):         1. Every 15 minutes, drink the solution down to the next mark (approx 8 oz) until the full liter is complete.  2. Follow completed prep with 16 oz. of clear liquid of your choice.    3. You may drink clear liquids until 8:30 am  (2 HOURS BEFORE PROCEDURE).   MEDICATION INSTRUCTIONS  Unless otherwise instructed, you should take regular prescription medications with a small sip of water   as early as possible the morning of  your procedure.          OTHER INSTRUCTIONS  You will need a responsible adult at least 72 years of age to accompany you and drive you home.   This person must remain in the waiting room during your procedure.  Wear loose fitting clothing that is easily removed.  Leave jewelry and other valuables at home.  However, you may wish to bring a book to read or  an iPod/MP3 player to listen to music as you wait for your procedure to start.  Remove all body piercing jewelry and leave at home.  Total time from sign-in until discharge is approximately 2-3 hours.  You should go home directly after your procedure and rest.  You can resume normal activities the  day after your procedure.  The day of your procedure you should not:   Drive   Make legal decisions   Operate machinery   Drink alcohol   Return to work  You will receive specific instructions about eating, activities and medications before you leave.    The above instructions have been reviewed and explained to me by   Ezra Sites RN  October 30, 2009 1:13 PM    I fully understand and can verbalize  these instructions _____________________________ Date _________

## 2010-08-03 NOTE — Letter (Signed)
Summary: Colonoscopy Letter  [facility]  [address] [place], [place] [address]   Phone: [phone]  Fax: [phone]      September 10, 2009 MRN: [id number]   [name] [address] [place], [place]  [address]   Dear [name],   According to your medical record, it is time for you to schedule a Colonoscopy. The American Cancer Society recommends this procedure as a method to detect early colon cancer. Patients with a family history of colon cancer, or a personal history of colon polyps or inflammatory bowel disease are at increased risk.  This letter has beeen generated based on the recommendations made at the time of your procedure. If you feel that in your particular situation this may no longer apply, please contact our office.  Please call our office at [phone] to schedule this appointment or to update your records at your earliest convenience.  Thank you for cooperating with [place] to provide you with the very best care possible.   Sincerely,   Vania Rea. Jarold Motto, M.D.  [facility] [phone]

## 2010-08-03 NOTE — Progress Notes (Signed)
Summary: rx correction  Phone Note From Pharmacy   Caller: [facility] Summary of Call: Need Phenobarb prescription with correct qty. Initial call taken by: Lucious Groves,  September 29, 2009 3:21 PM  Follow-up for Phone Call        signed and faxed. Follow-up by: Daphane Shepherd,  September 30, 2009 9:32 AM    Prescriptions: PHENOBARBITAL 97.2 MG  TABS (PHENOBARBITAL) Take 1 tablet by mouth two times a day  #180 x 0   Entered by:   Lucious Groves   Authorized by:   Jacques Navy MD   Signed by:   Lucious Groves on 09/29/2009   Method used:   Printed then faxed to ...       [facility] [address] [phone]* (retail)       [address]       [place], [place]  [address]       Ph: [phone] or [phone]       Fax: [phone]   RxID:   [id number]

## 2010-08-03 NOTE — Miscellaneous (Signed)
Summary: [facility]  Clinical Lists Changes  Medications: Added new medication of MOVIPREP 100 GM  SOLR (PEG-KCL-NACL-NASULF-NA ASC-C) As per prep instructions. - Signed Rx of MOVIPREP 100 GM  SOLR (PEG-KCL-NACL-NASULF-NA ASC-C) As per prep instructions.;  #1 x 0;  Signed;  Entered by: Ezra Sites RN;  Authorized by: Mardella Layman MD [facility];  Method used: Electronically to [facility] # [id number]*, [address], [place], [place]  [address], Ph: [phone], Fax: [phone] Observations: Added new observation of NKA: T (10/30/2009 12:41)    Prescriptions: MOVIPREP 100 GM  SOLR (PEG-KCL-NACL-NASULF-NA ASC-C) As per prep instructions.  #1 x 0   Entered by:   Ezra Sites RN   Authorized by:   Mardella Layman MD [facility]   Signed by:   Ezra Sites RN on 10/30/2009   Method used:   Electronically to        [facility] # [phone]* (retail)       [address]       [place], [place]  [address]       Ph: [phone]       Fax: [phone]   RxID:   [id number]

## 2010-08-03 NOTE — Progress Notes (Signed)
  Phone Note Refill Request Message from:  Fax from Pharmacy on September 14, 2009 1:49 PM  Refills Requested: Medication #1:  PHENOBARBITAL 97.2 MG  TABS Take 1 tablet by mouth two times a day received refill request from [facility], please advise refill   Follow-up for Phone Call        done hardcopy to LIM side B - dahlia   please ask pt to make yearly OV april 2011 with dr Debby Bud Follow-up by: Corwin Levins MD,  September 14, 2009 3:35 PM  Additional Follow-up for Phone Call Additional follow up Details #1::        mailed prescription to pt per her request Additional Follow-up by: Ami Bullins CMA,  September 14, 2009 4:06 PM    Prescriptions: PHENOBARBITAL 97.2 MG  TABS (PHENOBARBITAL) Take 1 tablet by mouth two times a day  #1180 x 0   Entered and Authorized by:   Corwin Levins MD   Signed by:   Corwin Levins MD on 09/14/2009   Method used:   Print then Give to Patient   RxID:   [id number]

## 2010-08-03 NOTE — Letter (Signed)
Summary: [facility]   Imported By: Lester [place] 12/15/2009 12:37:56  _____________________________________________________________________  External Attachment:    Type:   Image     Comment:   External Document

## 2010-08-03 NOTE — Letter (Signed)
Summary: Otolaryngology/[facility]  Otolaryngology/[facility]   Imported By: Sherian Rein 12/30/2009 09:39:35  _____________________________________________________________________  External Attachment:    Type:   Image     Comment:   External Document

## 2010-08-04 HISTORY — PX: LARYNX SURGERY: SHX692

## 2010-08-05 NOTE — Letter (Signed)
Summary: [facility]  [facility]   Imported By: Lennie Odor 07/06/2010 14:10:45  _____________________________________________________________________  External Attachment:    Type:   Image     Comment:   External Document

## 2010-08-05 NOTE — Progress Notes (Signed)
    Preventive Care Screening  Mammogram:    Date:  07/28/2010    Results:  normal bilateral  

## 2010-08-05 NOTE — Progress Notes (Signed)
  Phone Note Refill Request Message from:  Fax from Pharmacy on June 15, 2010 9:08 AM  Refills Requested: Medication #1:  ALENDRONATE SODIUM 70 MG  TABS 1 weekly fax from [facility]     Prescriptions: ALENDRONATE SODIUM 70 MG  TABS (ALENDRONATE SODIUM) 1 weekly  #12 x 3   Entered by:   Ami Bullins CMA   Authorized by:   Jacques Navy MD   Signed by:   Bill Salinas CMA on 06/15/2010   Method used:   Faxed to ...       [facility] (mail-order)             , [place]         Ph: [phone]       Fax: [phone]   RxID:   [id number]

## 2010-08-05 NOTE — Progress Notes (Signed)
  Phone Note Refill Request Message from:  Fax from Pharmacy     Prescriptions: SIMVASTATIN 80 MG  TABS (SIMVASTATIN) Take one tablet once daily  #90 x 3   Entered by:   Ami Bullins CMA   Authorized by:   Jacques Navy MD   Signed by:   Bill Salinas CMA on 07/22/2010   Method used:   Electronically to        [facility] (retail)       [address] [place], [place]  [address]       Ph: [phone]       Fax: [phone]   RxID:   [id number]

## 2010-08-05 NOTE — Progress Notes (Signed)
  Phone Note Refill Request Message from:  Fax from Pharmacy  Refills Requested: Medication #1:  LEVOTHROID 50 MCG  TABS Take one tablet once daily Initial call taken by: Ami Bullins CMA,  July 20, 2010 8:56 AM    Prescriptions: LEVOTHROID 50 MCG  TABS (LEVOTHYROXINE SODIUM) Take one tablet once daily  #90 x 3   Entered by:   Ami Bullins CMA   Authorized by:   Jacques Navy MD   Signed by:   Bill Salinas CMA on 07/20/2010   Method used:   Faxed to ...       [facility] (retail)       [address] [place], [place]  [address]       Ph: [phone]       Fax: [phone]   RxID:   [id number]

## 2010-08-11 NOTE — Letter (Signed)
Summary: [facility] [employer]  [facility] [employer]   Imported By: Sherian Rein 08/02/2010 11:55:36  _____________________________________________________________________  External Attachment:    Type:   Image     Comment:   External Document

## 2010-08-19 ENCOUNTER — Telehealth: Payer: Self-pay | Admitting: Internal Medicine

## 2010-08-25 NOTE — Progress Notes (Signed)
Summary: Rx Refill request   Phone Note From Pharmacy   Caller: [facility] Fax [phone] Call For: Phenytoin EX Cap 100mg   Reason for Call: Needs renewal Summary of Call: [facility] requesting Rx refill for Phenytoin EX Cap 100mg   Disp: 270 Initial call taken by: Burnard Leigh [facility]),  August 19, 2010 9:13 AM  Follow-up for Phone Call        ok for as needed refills.  Follow-up by: Jacques Navy MD,  August 19, 2010 1:23 PM    Prescriptions: PHENYTOIN SODIUM EXTENDED 100 MG  CAPS (PHENYTOIN SODIUM EXTENDED) Take 3 tablets at bedtime  #270 x 11   Entered by:   Rock Nephew CMA   Authorized by:   Jacques Navy MD   Signed by:   Rock Nephew CMA on 08/19/2010   Method used:   Faxed to ...       [facility] (mail-order)             , [place]         Ph: [phone]       Fax: [phone]   RxID:   [id number]

## 2010-08-25 NOTE — Progress Notes (Signed)
Summary: RX refill request  Phone Note From Pharmacy   Caller: [facility] Fax [phone] Reason for Call: Needs renewal Summary of Call: [facility] requesting Rx refill for Phenobarbital TAB 97.2mg  Disp:180 Initial call taken by: Burnard Leigh [facility]),  August 19, 2010 8:50 AM  Follow-up for Phone Call        ok for refill as needed  Follow-up by: Jacques Navy MD,  August 19, 2010 1:22 PM    Prescriptions: PHENOBARBITAL 97.2 MG  TABS (PHENOBARBITAL) Take 1 tablet by mouth two times a day  #180 x 3   Entered by:   Rock Nephew CMA   Authorized by:   Jacques Navy MD   Signed by:   Rock Nephew CMA on 08/19/2010   Method used:   Printed then faxed to ...       [facility] (mail-order)             , [place]         Ph: [phone]       Fax: [phone]   RxID:   [id number]

## 2010-08-30 ENCOUNTER — Other Ambulatory Visit: Payer: Self-pay | Admitting: Dermatology

## 2010-09-13 ENCOUNTER — Telehealth: Payer: Self-pay | Admitting: Internal Medicine

## 2010-09-16 ENCOUNTER — Inpatient Hospital Stay (HOSPITAL_COMMUNITY)
Admission: EM | Admit: 2010-09-16 | Discharge: 2010-09-21 | DRG: 536 | Disposition: A | Discharge status: Skilled Nursing Facility | Payer: Medicare HMO | Attending: Internal Medicine | Admitting: Internal Medicine

## 2010-09-16 ENCOUNTER — Emergency Department (HOSPITAL_COMMUNITY): Payer: Medicare HMO

## 2010-09-16 DIAGNOSIS — E785 Hyperlipidemia, unspecified: Secondary | ICD-10-CM | POA: Diagnosis present

## 2010-09-16 DIAGNOSIS — S32509A Unspecified fracture of unspecified pubis, initial encounter for closed fracture: Principal | ICD-10-CM | POA: Diagnosis present

## 2010-09-16 DIAGNOSIS — E039 Hypothyroidism, unspecified: Secondary | ICD-10-CM | POA: Diagnosis present

## 2010-09-16 DIAGNOSIS — A498 Other bacterial infections of unspecified site: Secondary | ICD-10-CM | POA: Diagnosis present

## 2010-09-16 DIAGNOSIS — N390 Urinary tract infection, site not specified: Secondary | ICD-10-CM | POA: Diagnosis present

## 2010-09-16 DIAGNOSIS — W010XXA Fall on same level from slipping, tripping and stumbling without subsequent striking against object, initial encounter: Secondary | ICD-10-CM | POA: Diagnosis present

## 2010-09-16 DIAGNOSIS — I4891 Unspecified atrial fibrillation: Secondary | ICD-10-CM | POA: Diagnosis present

## 2010-09-16 DIAGNOSIS — I10 Essential (primary) hypertension: Secondary | ICD-10-CM | POA: Diagnosis present

## 2010-09-16 DIAGNOSIS — Y92009 Unspecified place in unspecified non-institutional (private) residence as the place of occurrence of the external cause: Secondary | ICD-10-CM

## 2010-09-16 LAB — URINE MICROSCOPIC-ADD ON

## 2010-09-16 LAB — URINALYSIS, ROUTINE W REFLEX MICROSCOPIC
Bilirubin Urine: NEGATIVE
Glucose, UA: NEGATIVE mg/dL
Ketones, ur: NEGATIVE mg/dL
Nitrite: NEGATIVE
Protein, ur: NEGATIVE mg/dL
Specific Gravity, Urine: 1.008 (ref 1.005–1.030)
Urobilinogen, UA: 0.2 mg/dL (ref 0.0–1.0)

## 2010-09-16 LAB — CBC
Hemoglobin: 12.8 g/dL (ref 12.0–15.0)
MCH: 32.2 pg (ref 26.0–34.0)
Platelets: 135 10*3/uL — ABNORMAL LOW (ref 150–400)
RBC: 3.98 MIL/uL (ref 3.87–5.11)
WBC: 14.5 10*3/uL — ABNORMAL HIGH (ref 4.0–10.5)

## 2010-09-16 LAB — COMPREHENSIVE METABOLIC PANEL
ALT: 18 U/L (ref 0–35)
AST: 23 U/L (ref 0–37)
Albumin: 3.6 g/dL (ref 3.5–5.2)
Alkaline Phosphatase: 69 U/L (ref 39–117)
CO2: 30 mEq/L (ref 19–32)
Chloride: 100 mEq/L (ref 96–112)
GFR calc Af Amer: >60 mL/min (ref >60)
GFR calc non Af Amer: >60 mL/min (ref >60)
Potassium: 3.8 mEq/L (ref 3.5–5.1)
Sodium: 138 mEq/L (ref 135–145)
Total Bilirubin: 0.6 mg/dL (ref 0.3–1.2)

## 2010-09-16 LAB — MAGNESIUM: Magnesium: 2.2 mg/dL (ref 1.5–2.5)

## 2010-09-16 LAB — DIFFERENTIAL
Basophils Absolute: 0 10*3/uL (ref 0.0–0.1)
Basophils Relative: 0 % (ref 0–1)
Eosinophils Relative: 0 % (ref 0–5)
Monocytes Absolute: 1.2 10*3/uL — ABNORMAL HIGH (ref 0.1–1.0)
Monocytes Relative: 9 % (ref 3–12)

## 2010-09-16 LAB — PROTIME-INR
INR: 1.18 (ref 0.00–1.49)
Prothrombin Time: 15.2 seconds (ref 11.6–15.2)

## 2010-09-16 LAB — PHENYTOIN LEVEL, TOTAL: Phenytoin Lvl: 23.5 ug/mL — ABNORMAL HIGH (ref 10.0–20.0)

## 2010-09-16 LAB — PHENOBARBITAL LEVEL: Phenobarbital: 38.5 ug/mL (ref 15.0–40.0)

## 2010-09-17 ENCOUNTER — Inpatient Hospital Stay (HOSPITAL_COMMUNITY)
Admit: 2010-09-17 | Discharge: 2010-09-17 | Disposition: A | Discharge status: Home / Self Care | Payer: Medicare HMO | Attending: Internal Medicine | Admitting: Internal Medicine

## 2010-09-17 LAB — LIPID PANEL
LDL Cholesterol: 39 mg/dL (ref 0–99)
Triglycerides: 50 mg/dL (ref <150)

## 2010-09-17 LAB — RAPID URINE DRUG SCREEN, HOSP PERFORMED
Amphetamines: NOT DETECTED
Benzodiazepines: NOT DETECTED
Opiates: NOT DETECTED
Tetrahydrocannabinol: NOT DETECTED

## 2010-09-17 NOTE — H&P (Signed)
[name], [name]            ACCOUNT NO.:  [id number]  MEDICAL RECORD NO.:  [id number]           PATIENT TYPE:  E  LOCATION:  WLED                         FACILITY:  [facility]  PHYSICIAN:  Kathlen Mody, MD       DATE OF BIRTH:  Oct 22, 1940  DATE OF ADMISSION:  09/16/2010 DATE OF DISCHARGE:                             HISTORY & PHYSICAL   PRIMARY CARE PHYSICIAN:  Almedia Balls. Ranell Patrick, M.D.  CHIEF COMPLAINT:  Left hip pain.  HISTORY OF PRESENT ILLNESS:  This is a 70 year old lady with past medical history of CVA, seizures, peripheral vascular disease, hypertension, hypothyroidism, came into ER complaining of left-sided hip pain.  On Tuesday, 2 days ago, patient went to mailbox to get mails, she tripped and fell on the left side, since then she has been complaining of left hip pain and pain on ambulating.  There is no history of dizziness prior to the fall.  Patient denies any loss of consciousness. Patient denies hitting her head.  She reports falling on the left side and on her bottom.  History of low back pain since Tuesday, not associated with any urinary complaints or bowel incontinence.  No history of fevers or chills.  No chest pain, shortness of breath, cough, syncope, palpitations.  No nausea, vomiting, abdominal pain.  No diarrhea.  No urinary complaints.  No history of any sensory abnormalities like tingling or numbness.  No history of any weakness.  REVIEW OF SYSTEMS:  See HPI, otherwise negative.  PAST MEDICAL HISTORY: 1. Prior history of CVA. 2. Hypertension. 3. Hyperlipidemia. 4. Peripheral vascular disease. 5. Hypothyroidism. 6. 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 7. Right breast cancer. 8. Oral squamous cell cancer.  PAST SURGICAL HISTORY: 1. Craniotomy from evacuation of a subdural hematoma in 1992. 2. Left breast lumpectomy followed by radiation therapy in 2007. 3. Right breast mastectomy with node dissection in 1999. 4. Left  carotid endarterectomy in 2000. 5. 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 6. Bilateral cataract extraction.  SOCIAL HISTORY:  Lives at home with husband, occasional EtOH, ex-smoker, no indication of drug abuse.  Patient is retired.  FAMILY HISTORY:  Breast cancer and diabetes, sisters and heart disease in mother.  ALLERGIES:  No known drug allergies.  HOME MEDICATIONS: 1. Fosamax 70 mg 1 tablet weekly. 2. Tylenol 325 one to two tablets every 6 hours as needed. 3. Multivitamin 1 tablet daily. 4. Calcium carbonate/vitamin D one tab daily. 5. Magnesium 1 tablet daily. 6. Simvastatin 80 mg 1 tablet daily. 7. Folic acid 1 mg 1 tablet daily. 8. Levothyroxine 50 mcg 1 tablet daily. 9. Phenobarbital 97.2 one tablets twice daily. 10.Dilantin 100 mg p.o. 3 tablets daily at bedtime.  PHYSICAL EXAMINATION:  VITAL SIGNS:  Latest vitals, temperature of 97.3, blood pressure of 118/50, pulse rate of 80, respiratory rate of 16, saturating about 93%-94% on room air. GENERAL:  On exam, she is alert, afebrile.  She is comfortable lying in bed, in no acute distress. HEENT EXAMINATION:  Pupils equal and  reacting to light.  Appears slightly dehydrated.  No JVD. CARDIOVASCULAR EXAMINATION:  S1, S2 heard.  No rubs, murmurs or gallops. Regular rate and rhythm. RESPIRATORY EXAMINATION:  Poor inspiratory effort.  Decreased at bases. No wheezes or rhonchi. ABDOMEN:  Soft, nontender, nondistended.  Good bowel sounds. EXTREMITIES:  No pedal edema.  Right hip tenderness over the front.  DIAGNOSTIC STUDIES:  EKG, normal sinus rhythm at 80 per minute.  PERTINENT LABORATORY DATA:  CBC significant for WBC count of 14.5, hemoglobin of 12.8, hematocrit of 37.7, platelets of 135.  Comprehensive metabolic panel within normal limits.  Dilantin level 23.5. Phenobarbital level 38.5.  Urinalysis shows large leukocytes,  negative for nitrites, small blood.  RADIOLOGY:  Has x-ray of the spine, no acute compression deformity.  X- ray of the hip, hip is intact, but there is a nondisplaced fracture of the left inferior pelvic ramus, otherwise negative.  ASSESSMENT AND PLAN:  This 70 year old lady admitted status post fall with a nondisplaced inferior pelvic ramus fracture and with elevated Dilantin level.  As per the history, the fall is secondary to trip, but I will also rule out any seizure activity.  We will continue the home doses of Dilantin and phenobarbital at this time.  We will get an electroencephalogram done while she is being in the hospital.  Inferior pelvic ramus fracture:  Bedrest for now.  Ortho consult called. Physical therapy/occupational therapy evaluation with social worker and consult for possible short term skilled nursing facility on discharge.  Leukocytosis:  Afebrile at this time, most likely reactive.  Urinalysis shows large leukocytes.  We will get a chest x-ray to see if there is any focus of infection.  Hypertension:  Controlled, not on any medications at this time.  Hyperlipidemia:  We will get a lipid profile, fasting lipid profile, continue with her home dose of simvastatin daily.  Hypothyroidism:  Get a TSH level.  We will continue with home medication of levothyroxine at 50 mcg daily.  Deep venous thrombosis prophylaxis:  Subcutaneous Lovenox.  Patient is full code.          ______________________________ Kathlen Mody, MD     VA/MEDQ  D:  09/16/2010  T:  09/16/2010  Job:  [id number]  Electronically Signed by Kathlen Mody MD on 09/17/2010 06:00:39 PM

## 2010-09-18 DIAGNOSIS — I369 Nonrheumatic tricuspid valve disorder, unspecified: Secondary | ICD-10-CM

## 2010-09-18 LAB — CARDIAC PANEL(CRET KIN+CKTOT+MB+TROPI)
CK, MB: 2.5 ng/mL (ref 0.3–4.0)
CK, MB: 2 ng/mL (ref 0.3–4.0)
Relative Index: INVALID (ref 0.0–2.5)
Total CK: 97 U/L (ref 7–177)
Total CK: 50 U/L (ref 7–177)
Troponin I: 0.01 ng/mL (ref 0.00–0.06)
Troponin I: 0.01 ng/mL (ref 0.00–0.06)

## 2010-09-18 LAB — BASIC METABOLIC PANEL
BUN: 7 mg/dL (ref 6–23)
Calcium: 8 mg/dL — ABNORMAL LOW (ref 8.4–10.5)
Creatinine, Ser: 0.63 mg/dL (ref 0.4–1.2)
GFR calc non Af Amer: >60 mL/min (ref >60)
Glucose, Bld: 121 mg/dL — ABNORMAL HIGH (ref 70–99)
Potassium: 4.1 mEq/L (ref 3.5–5.1)

## 2010-09-18 LAB — URINE CULTURE
Colony Count: 25000
Culture  Setup Time: 201203152225

## 2010-09-18 LAB — CBC
HCT: 34.2 % — ABNORMAL LOW (ref 36.0–46.0)
MCHC: 33.3 g/dL (ref 30.0–36.0)
MCV: 94.2 fL (ref 78.0–100.0)
Platelets: 123 10*3/uL — ABNORMAL LOW (ref 150–400)
RDW: 13.6 % (ref 11.5–15.5)
WBC: 9 10*3/uL (ref 4.0–10.5)

## 2010-09-20 DIAGNOSIS — E785 Hyperlipidemia, unspecified: Secondary | ICD-10-CM

## 2010-09-20 DIAGNOSIS — A498 Other bacterial infections of unspecified site: Secondary | ICD-10-CM

## 2010-09-20 DIAGNOSIS — I4891 Unspecified atrial fibrillation: Secondary | ICD-10-CM

## 2010-09-20 DIAGNOSIS — N390 Urinary tract infection, site not specified: Secondary | ICD-10-CM

## 2010-09-20 DIAGNOSIS — S32509A Unspecified fracture of unspecified pubis, initial encounter for closed fracture: Secondary | ICD-10-CM

## 2010-09-20 DIAGNOSIS — I10 Essential (primary) hypertension: Secondary | ICD-10-CM

## 2010-09-20 NOTE — Consult Note (Signed)
NAMECAMBRE, [name]            ACCOUNT NO.:  [id number]  MEDICAL RECORD NO.:  [id number]           PATIENT TYPE:  I  LOCATION:  1412                         FACILITY:  [facility]  PHYSICIAN:  Noralyn Pick. Eden Emms, MD, FACCDATE OF BIRTH:  1940/08/12  DATE OF CONSULTATION: DATE OF DISCHARGE:                                CONSULTATION   A 70 year old patient admitted to the hospital by primary service and Dr. Debby Bud for left hip pain and a fall.  The patient has a nondisplaced fracture of the left inferior pelvic ramus.  We are asked to see her for paroxysmal atrial fibrillation.  Review of her EKGs revealed that she appeared to be in sinus rhythm on the 15th.  EKG on the 16th showed atrial fibrillation and Dr. Debby Bud ordered an echo and changed her metoprolol to Cardizem CD 180 mg a day.  I have reviewed her 2-D echocardiogram from the 17th.  It was normal with an EF of 60-65% and no significant LVH.  The patient currently has no cardiac symptoms.  She denies chest pain, dyspnea, palpitations, or syncope.  She appears to have tripped on a porch and tried to break her fall.  There did not appear to be any significant cardiac etiology to her fall.  She has gotten up and out of the bed today and ambulated down the hall with a walker.  Plans are for skilled nursing facility and rehab.  She does not need surgery.  Her 10-point review of systems otherwise negative.  Her past medical history is remarkable for history of CVA with left carotid endarterectomy, hypertension, hyperlipidemia, peripheral vascular disease, hypothyroidism, history of seizures with previous subdural hematoma and craniotomy, history of breast cancer with right mastectomy.  The patient lives at home with her husband.  She drinks occasionally. She is a previous smoker.  She is retired.  She was ambulatory and living independently prior to her fall.  Family history is remarkable for breast cancer and diabetes  in her sisters and heart disease in her mother.  She has no known allergies.  Medications prior to admission included Fosamax 70 mg a week, Tylenol, multivitamins, calcium, magnesium, simvastatin 80 mg a day, folic acid 1 mg a day, Synthroid 50 mcg a day, phenobarbital 97.2 mg 1 tablet twice a day, Dilantin 100 mg 3 tablets daily at bedtime.  PHYSICAL EXAMINATION:  GENERAL:  Her exam is remarkable for somewhat disheveled female who is in a jovial mood. VITAL SIGNS:  She is in A fib at a rate of 81, blood pressure is 128/74, respirations 14, afebrile.  Room air sats 91%. HEENT:  Unremarkable.  Left carotid bruit, previous left CEA scar. LUNGS:  Clear.  Good diaphragmatic motion. HEART:  S1, S2, normal heart sounds.  PMI normal. ABDOMEN:  Benign.  Bowel sounds positive.  No AAA.  No tenderness.  No bruit.  No hepatosplenomegaly or hepatojugular reflux tenderness.  Her pulse is intact.  No edema. NEUROLOGIC:  Nonfocal. SKIN:  Warm and dry, status post right mastectomy.  LABORATORY DATA:  Lab work is remarkable for a negative cardiac panel x3, potassium 4.1, creatinine 0.63, hematocrit 34, platelet  123.  TSH 1.7.  Alcohol less than 5 on admission.  IMPRESSION: 1. PAF in the setting of a fall with previous history of subdural.     She does not strike me as an ideal Coumadin candidate.  I will     leave decisions regarding anticoagulation up to Dr. Debby Bud who     knows her much better.  Cardizem CD is reasonable for rate control.     She is asymptomatic.  Echo was normal without significant LV     dysfunction, although she has vascular disease with a previous left     CEA.  There is no chest pain or ischemic EKG changes and her CPKs     were negative.  I do not think an ischemic workup is needed at this     time.  Continued rate control and anticoagulation as per primary     would be an order. 2. Left inferior pubic ramus fracture.  Weightbearing with walker at     this time.  Plan  is to transfer to skilled nursing facility. 3. History of left carotid endarterectomy, no recent transient     ischemic attack.  I am assuming she gets surveillance carotid     duplex for [place] protocol at our office. 4. Hypertension, currently well controlled. 5. Hyperlipidemia.  Continue statin.  She is on high-dose simvastatin     at 80 mg.  Consider switching to generic Lipitor at 40 mg.  Given     FDA warnings regarding high-dose simvastatin and also interaction     with Cardizem.     Noralyn Pick. Eden Emms, MD, [facility]     PCN/MEDQ  D:  09/20/2010  T:  09/20/2010  Job:  [id number]  Electronically Signed by Charlton Haws MD [facility] on 09/20/2010 03:10:22 PM

## 2010-09-21 NOTE — Progress Notes (Signed)
  Phone Note Refill Request   Refills Requested: Medication #1:  TAMOXIFEN CITRATE 20 MG  TABS Take one tablet once daily Initial call taken by: Ami Bullins CMA,  September 13, 2010 2:32 PM    Prescriptions: TAMOXIFEN CITRATE 20 MG  TABS (TAMOXIFEN CITRATE) Take one tablet once daily  #90 x 3   Entered by:   Ami Bullins CMA   Authorized by:   Jacques Navy MD   Signed by:   Bill Salinas CMA on 09/13/2010   Method used:   Electronically to        [facility] (retail)       [address] [place], [place]  [address]       Ph: [phone]       Fax: [phone]   RxID:   [id number]

## 2010-09-24 NOTE — Procedures (Signed)
HISTORY:  This is a 70 year old female with a history of seizures from a left craniotomy, now status post fall.  MEDICATIONS:  Multivitamin, Mag-Ox, Crestor, folic acid, levothyroxine, phenobarbital, and Dilantin.  CONDITION OF RECORDING:  This is a 16-channel EEG carried out with the patient in the awake and drowsy states.  DESCRIPTION:  The waking background activity consisted with low-voltage, symmetrical fairly well-organized 6-7 Hz theta activity seen from the parietooccipital and posterior temporal regions.  Low-voltage fast activity poorly organized and seen anteriorly and at times superimposed on more posterior rhythms.  A mixture of theta and alpha rhythms seen from the central and temporal regions.  Over the left hemisphere was noted higher voltage activity that had an underlying slow rhythm at times and embedded sharp activity.  This was persistent throughout the tracing.  Hyperventilation and intermittent photic stimulation were not performed.  The patient has some further background slowing with drowsy, but does not exhibit stage II sleep.  IMPRESSION:  This is an abnormal EEG secondary to irritative activity over the left hemisphere with some embedded sharp activity which is consistent with the patient's history of craniotomy.  This may be seen in the clinical setting of seizures associated with craniotomy as well.          ______________________________ Thana Farr, MD    EA:VWUJ D:  09/17/2010 19:38:59  T:  09/18/2010 00:49:09  Job #:  [id number]

## 2010-10-04 NOTE — Discharge Summary (Signed)
[name], [name]            ACCOUNT NO.:  [id number]  MEDICAL RECORD NO.:  [id number]           PATIENT TYPE:  I  LOCATION:  1412                         FACILITY:  [facility]  PHYSICIAN:  Rosalyn Gess. Norins, MD  DATE OF BIRTH:  Dec 04, 1940  DATE OF ADMISSION:  09/16/2010 DATE OF DISCHARGE:                              DISCHARGE SUMMARY   Anticipated transfer to skilled care facility September 21, 2010 or September 22, 2010.  ADMITTING DIAGNOSES: 1. Fracture of inferior pubic ramus, right. 2. Leukocytosis. 3. Hypertension. 4. Hyperlipidemia. 5. Hypothyroid disease.  DISCHARGE DIAGNOSES: 1. Fracture of inferior pelvic ramus, needs for skilled care. 2. Leukocytosis, resolved. 3. Hypertension. 4. Hyperlipidemia. 5. Hypothyroid disease. 6. Atrial fibrillation, rate controlled. 7. Urinary tract infection with Escherichia coli, on Cipro.  CONSULTANTS:  Dr. Charlton Haws in Dr. Patty Sermons for Cardiology.  PROCEDURES: 1. L-spine complete on the day of admission which showed normal     alignment with no acute compression deformity. 2. Diagnostic hip left which showed nondisplaced fracture of the left     inferior pelvic ramus, negative left hip. 3. 2D echo which showed an EF of 60%-65% with no LVH and no dilation     of the left atrium and otherwise a normal study.  HISTORY OF PRESENT ILLNESS:  Ms. Poinsett is a 70 year old woman with history of CVA, seizures in the past, peripheral vascular disease, hypertension, hypothyroid disease, who had a fall 2 days prior to admission.  Her mailbox which is on the side of the house and while there she tripped and fell on her left side grabbing step rails but she went down.  Since the time of her fall, she has been complaining of left hip pain and difficulty with ambulating due to pain.  The patient had no dizziness or loss of consciousness with her fall.  It was merely a slip. She denied hitting her head.  Because of her persistent pain, she  came to the emergency department for evaluation.  Please see the H and P for detailed past medical history, past surgical history, social history and family history.  PHYSICAL EXAMINATION ON ADMISSION:  Physical exam at admission was remarkable only for the patient's inability to bear weight.  The rest for her exam was unremarkable.  LABORATORY DATA:  Pertinent laboratory revealed an EKG with sinus rhythm at 80.  CBC with a white count of 14,500, hemoglobin 12.8 grams. Metabolic panel was normal.  Dilantin level was supratherapeutic at 23.5.  Phenobarbital level was 38.5.  UA was positive for leukocytes and small blood.  X-rays as noted.  HOSPITAL COURSE: 1. Orthopedics.  The patient was seen in consultation by Dr. Norlene Campbell and his PA.  It was felt she did not have any need for     surgical intervention.  She had long-term skilled care for gradual     increase in mobility and weightbearing as her pubic ramus fracture     healed.  The patient was managed with pain medication.  She was     started on PT while in hospital.  She is a good candidate for short-  term skilled care to regain her mobility as her pain decreases as     her fractured ramus heals. 2. Atrial fibrillation.  The patient had new-onset atrial fibrillation     on telemetry.  She did have cardiac enzymes cycled that were     negative.  She was initially started on a beta blocker.  The     patient was switched on the 19th from a beta-blocker to the calcium     channel blocker with diltiazem CD 180.  Cardiology consult was     obtained.  They felt this was a good plan and had no additional     suggestions.  The patient will be continued on aspirin for stroke     prevention given her risk for falls and therefore contraindication     for Coumadin. 3. UTI.  The patient had UTI at admission.  She was started on Cipro     which she tolerated well..  She has been afebrile.  Final CBC from     the 17th showed a  white count that was normal at 9000, hemoglobin     11.4 grams, hematocrit 34.2%, platelet count 123,000.  The patient being medically stable with her UTI treated with atrial fibrillation rate controlled with diltiazem.  She is ready for transfer to skilled care for short-term rehab.  DISCHARGE EXAMINATION:  VITAL SIGNS:  Temperature was 98.9, blood pressure 129/71, pulse was 86, respirations 18, O2 sats 91% on room air. GENERAL APPEARANCE:  A well-nourished, well-developed woman in no acute distress. HEENT:  Unremarkable with no signs of trauma. NECK:  Supple. CHEST:  Patient is moving air well with no rales, wheezes, or rhonchi. CARDIOVASCULAR:  The patient had 2+ peripheral pulse, 2+ dorsalis pedis pulse.  Heart rate was regular on exam. ABDOMEN:  Soft. EXTREMITIES:  Were not examined at this time. SKIN:  Clear with no signs of skin breakdown.  FINAL LABORATORY REPORT:  CBC as noted above.  Final metabolic panel March 17, sodium of 136, potassium 4.1, chloride of 101, CO2 of 29, BUN 7, creatinine 0.63, glucose was 121, calcium was 8.  The patient's cardiac enzymes were cycled and negative x3.  Lipid profile from 16th with a cholesterol of 137, HDL was 88, LDL was 39, triglycerides were 50.  TSH on admission was 1.714.  DISPOSITION:  The patient is ready for transfer to skilled care.  MEDICATIONS:  Please see the discharge medication manager which she will continue on her home medications with the addition of diltiazem CD 180. She will continue ciprofloxacin for additional 3 days.  FOLLOWUP:  The patient will be seen in the office in followup on an as- needed basis.  The patient's condition at time of discharge dictation is stable and ready for transfer to skilled care.     Rosalyn Gess Norins, MD     MEN/MEDQ  D:  09/21/2010  T:  09/21/2010  Job:  [id number]  Electronically Signed by Illene Regulus MD on 10/04/2010 09:02:01 AM

## 2010-10-05 ENCOUNTER — Telehealth: Payer: Self-pay | Admitting: *Deleted

## 2010-10-05 MED ORDER — DILTIAZEM HCL ER COATED BEADS 180 MG PO CP24: 180.0000 mg | ORAL_CAPSULE | Freq: Every day | ORAL | Status: DC

## 2010-10-05 NOTE — Telephone Encounter (Signed)
She should continue until seen for follow-up a which time all meds will be reviewed.

## 2010-10-05 NOTE — Telephone Encounter (Signed)
Pt's husband informed, rf sent in

## 2010-10-05 NOTE — Telephone Encounter (Signed)
Pt was discharged from rehab on diltiazem, Should she continue this new med?

## 2010-10-13 ENCOUNTER — Telehealth: Payer: Self-pay | Admitting: *Deleted

## 2010-10-13 NOTE — Telephone Encounter (Signed)
Ok for rx for rolling walker 4" wheels

## 2010-10-13 NOTE — Telephone Encounter (Signed)
Therapist is req rx for rolling walker. Need to call pt when ready for pick up.

## 2010-10-14 NOTE — Telephone Encounter (Signed)
Pending signature

## 2010-10-14 NOTE — Telephone Encounter (Signed)
Rx ready, pt's husband informed to pick up

## 2010-10-15 ENCOUNTER — Telehealth: Payer: Self-pay | Admitting: *Deleted

## 2010-10-15 NOTE — Telephone Encounter (Signed)
Pt's husband left vm - We need to call insurance regarding getting walker covered. Need to call [phone]. Husband would also like a call back to discuss first.

## 2010-10-18 ENCOUNTER — Other Ambulatory Visit (INDEPENDENT_AMBULATORY_CARE_PROVIDER_SITE_OTHER): Payer: Medicare HMO

## 2010-10-18 ENCOUNTER — Ambulatory Visit (INDEPENDENT_AMBULATORY_CARE_PROVIDER_SITE_OTHER): Payer: Medicare HMO | Admitting: Internal Medicine

## 2010-10-18 VITALS — BP 138/60 | HR 78 | Temp 97.8°F | Wt 131.0 lb

## 2010-10-18 DIAGNOSIS — R300 Dysuria: Secondary | ICD-10-CM

## 2010-10-18 DIAGNOSIS — S32509A Unspecified fracture of unspecified pubis, initial encounter for closed fracture: Secondary | ICD-10-CM

## 2010-10-18 DIAGNOSIS — I10 Essential (primary) hypertension: Secondary | ICD-10-CM

## 2010-10-18 DIAGNOSIS — S32599A Other specified fracture of unspecified pubis, initial encounter for closed fracture: Secondary | ICD-10-CM

## 2010-10-18 LAB — URINALYSIS, ROUTINE W REFLEX MICROSCOPIC
Bilirubin Urine: NEGATIVE
Nitrite: NEGATIVE
Total Protein, Urine: NEGATIVE
Urine Glucose: NEGATIVE
pH: 7.5 (ref 5.0–8.0)

## 2010-10-18 MED ORDER — ASPIRIN EC 325 MG PO TBEC: 325.0000 mg | DELAYED_RELEASE_TABLET | Freq: Every day | ORAL | Status: DC

## 2010-10-18 NOTE — Patient Instructions (Signed)
Fracture of inferior pubic ramus - continue Physical Therapy. Once you have been recovered with healing it will be ok to resume yoga.   Atrial fibrillation - heart rate seems normal today - at least rate controlled.  Plan - continue diltiazem, start full-strength aspirin 325mg  once a day.  Continue all your other medication.  Will check a urinalysis today for infection with recommendations to follow.   Return office visit in 1 moth.

## 2010-10-18 NOTE — Progress Notes (Signed)
Subjective:    Patient ID: [name], female    DOB: 1941/05/05, 70 y.o.   MRN: [id number]  HPI Mrs. Mulvaney had a fall and sustained a fracture of the left inferior pubic ramus March 13th and was transferred to [facility] March 20th She was discharged from [facility] April 3rd. Since being home she is getting PT 3 x week. She has been having burning pain with urination. She has also had some loose stools. She is otherwise doing OK and seems to be getting back to full mobility. This visit does confirm her need for on-going home health PT to complete her recovery from her injury.   Past Medical History  Diagnosis Date  . Peripheral vascular disease   . Subdural hematoma   . Ataxia   . Mild dysplasia of cervix   . UTI (lower urinary tract infection)   . Urinary incontinence   . Seizures   . Cerebrovascular disease   . Hypothyroidism   . Hx of colonic polyp   . Hyperlipidemia   . Adenocarcinoma, breast     bilateral  . H/O alcohol abuse    Past Surgical History  Procedure Date  . Subdural hematoma evacuation via craniotomy   . 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 Multiple tooth extractions     due to oral cancer  . Dilation and curettage of uterus   . Cataract extraction, bilateral   . Breast lumpectomy     left breast with radiation therapy  . Carotid endarterectomy     left  . Mastectomy     Right, history with nodule dissection   Family History  Problem Relation Age of Onset  . Coronary artery disease Mother   . Diabetes Sister    History   Social History  . Marital Status: Married    Spouse Name: N/A    Number of Children: N/A  . Years of Education: N/A   Occupational History  . Not on file.   Social History Main Topics  . Smoking status: Not on file  . Smokeless tobacco: Not on file  . Alcohol Use:   . Drug Use:   . Sexually Active:    Other Topics Concern  . Not on file   Social History Narrative   HSGMarried x 7 years  divorced; married '691 son- died as a neonate, 1son- '60Retired- worked as a Public relations account executive of life: has a living will- does not want futile/ heroic care; no cprMarriage- reports marriage is in good health (4/10)Will be working out at [employer].      Review of Systems  Constitutional: Negative for fever, chills, activity change and fatigue.  HENT: Negative.   Eyes: Negative.   Respiratory: Negative.   Cardiovascular: Negative for chest pain and leg swelling.  Gastrointestinal: Negative.   Genitourinary: Positive for dysuria and frequency.  Musculoskeletal:       Pain in the left groin/hip area due to fracture  Neurological: Positive for weakness.       Slightly affect speech from old stroke.  Hematological: Negative.   Psychiatric/Behavioral: Negative.        Objective:   Physical Exam  Vitals reviewed. Constitutional: She is oriented to person, place, and time. She appears well-developed and well-nourished. No distress.  HENT:  Head: Normocephalic and atraumatic.  Eyes: Conjunctivae and EOM are normal.  Neck: Neck supple.  Cardiovascular: Normal rate and regular rhythm.   Pulmonary/Chest: Effort normal and breath sounds normal.  Musculoskeletal:       Moves pretty well using rolling walker. Weight bearing is less than 100%  Neurological: She is alert and oriented to person, place, and time.  Skin: Skin is warm and dry.  Psychiatric: She has a normal mood and affect. Her behavior is normal.          Assessment & Plan:  1. Fractured inferior pubic ramus - making good progress and is now at home with HOME PT which is very helpful. Transportation is a trial.  2. Hypertension - good control \ 3. H/o CVA - stable with no change  4. Dysuria - for U/a with recommendations to follow

## 2010-10-19 ENCOUNTER — Ambulatory Visit: Payer: Medicare HMO | Admitting: Internal Medicine

## 2010-10-19 DIAGNOSIS — S32599A Other specified fracture of unspecified pubis, initial encounter for closed fracture: Secondary | ICD-10-CM | POA: Insufficient documentation

## 2010-10-19 NOTE — Telephone Encounter (Signed)
Ami spend 45 min on the phone with insurance company and was advised that "someone" would be calling her back.

## 2010-10-20 ENCOUNTER — Telehealth: Payer: Self-pay | Admitting: *Deleted

## 2010-10-20 NOTE — Telephone Encounter (Signed)
Patient requesting results of u/a.

## 2010-10-21 MED ORDER — SULFAMETHOXAZOLE-TRIMETHOPRIM 800-160 MG PO TABS: 1.0000 | ORAL_TABLET | Freq: Two times a day (BID) | ORAL | Status: AC

## 2010-10-21 NOTE — Telephone Encounter (Signed)
Pt's husband will call ins and see if he can get info needed

## 2010-10-21 NOTE — Telephone Encounter (Signed)
Very postive U/A - plan generic septra DS bid x 5 days. # 10

## 2010-10-21 NOTE — Telephone Encounter (Signed)
Patient informed, Rx sent in

## 2010-10-25 ENCOUNTER — Encounter (HOSPITAL_BASED_OUTPATIENT_CLINIC_OR_DEPARTMENT_OTHER): Payer: Medicare HMO | Admitting: Oncology

## 2010-10-25 DIAGNOSIS — R197 Diarrhea, unspecified: Secondary | ICD-10-CM

## 2010-10-25 DIAGNOSIS — C049 Malignant neoplasm of floor of mouth, unspecified: Secondary | ICD-10-CM

## 2010-10-25 DIAGNOSIS — N390 Urinary tract infection, site not specified: Secondary | ICD-10-CM

## 2010-10-25 DIAGNOSIS — Z853 Personal history of malignant neoplasm of breast: Secondary | ICD-10-CM

## 2010-10-27 ENCOUNTER — Telehealth: Payer: Self-pay | Admitting: *Deleted

## 2010-10-27 DIAGNOSIS — Z8679 Personal history of other diseases of the circulatory system: Secondary | ICD-10-CM

## 2010-10-27 NOTE — Telephone Encounter (Signed)
Rehab ordered

## 2010-10-27 NOTE — Telephone Encounter (Signed)
Spoke with pts husband and he had mentioned that pt will finish in home therapy tomorrow. The therapist that is working with [name] advised ongoing therapy for pt in an out pt setting. Can we put in order for pt to have ongoing therapy at an out pt setting. Please Advise

## 2010-10-29 ENCOUNTER — Telehealth: Payer: Self-pay | Admitting: *Deleted

## 2010-10-29 DIAGNOSIS — S32599A Other specified fracture of unspecified pubis, initial encounter for closed fracture: Secondary | ICD-10-CM

## 2010-10-29 NOTE — Telephone Encounter (Signed)
Home PT called - pt is being d/c'd today but they recommend outpt PT to continue to work on balance issues. Please put in new referral for this if you agree.

## 2010-10-29 NOTE — Telephone Encounter (Signed)
Pt c/o diarrhea since starting abx on 4/19. Says she has been taking med bid since it was picked up. I checked w/pharm and they dispensed #10 (5days, should have been out of med on 24th at latest) Pt says she has 7 pills left. I advised her that if she had taken med bid x 5 days she could stop. Also advised for her to call office if diarrhea did not get better or became worse, fever or any other symptoms.  No c/o fever, abd pain, blood or mucus. Urinary symptoms are gone now also. SHe will stop abx and call office w/any further problems.

## 2010-10-29 NOTE — Telephone Encounter (Signed)
KI. Order entered

## 2010-11-01 ENCOUNTER — Telehealth: Payer: Self-pay | Admitting: *Deleted

## 2010-11-01 ENCOUNTER — Ambulatory Visit: Payer: Self-pay | Admitting: Internal Medicine

## 2010-11-01 NOTE — Telephone Encounter (Signed)
Pt's continues to c/o loose stools every time she needs to urinate or after eating any food. Imodium has not helped. No blood or mucus seen by pt - symptoms x 2 wks and she was on abx recently. Advised to continue bland diet and drink clear liquids.

## 2010-11-02 MED ORDER — DILTIAZEM HCL ER COATED BEADS 180 MG PO CP24: 180.0000 mg | ORAL_CAPSULE | Freq: Every day | ORAL | Status: DC

## 2010-11-02 NOTE — Telephone Encounter (Signed)
Scheduled for tomorrow.

## 2010-11-02 NOTE — Telephone Encounter (Signed)
For repeat episodes of hematochezia with BM can see in the office for anoscopy.

## 2010-11-02 NOTE — Telephone Encounter (Signed)
Pt called again today - She has now has bright red rectal bleeding during bm's. She thinks she may have had some hemorrhoids in the past. Please advise.

## 2010-11-03 ENCOUNTER — Other Ambulatory Visit: Payer: Self-pay | Admitting: Endocrinology

## 2010-11-03 ENCOUNTER — Ambulatory Visit (INDEPENDENT_AMBULATORY_CARE_PROVIDER_SITE_OTHER): Payer: Medicare HMO | Admitting: Internal Medicine

## 2010-11-03 VITALS — BP 118/60 | HR 66 | Temp 97.0°F | Wt 131.0 lb

## 2010-11-03 DIAGNOSIS — R197 Diarrhea, unspecified: Secondary | ICD-10-CM

## 2010-11-03 MED ORDER — METRONIDAZOLE 500 MG PO TABS: 500.0000 mg | ORAL_TABLET | Freq: Three times a day (TID) | ORAL | Status: AC

## 2010-11-03 NOTE — Patient Instructions (Signed)
1. Diarrhea - worried about antibiotic related diarrhea vs. Infectious diarrhea. Plan - stool for culture to help rule out infection. Empiric trial of flagyl 500mg  tid for 10 days to treat a potential infectious diarrhea. Hydration - 1 6-8 oz glass of fluid for each loose stool.

## 2010-11-04 ENCOUNTER — Other Ambulatory Visit: Payer: Medicare HMO

## 2010-11-04 ENCOUNTER — Ambulatory Visit: Payer: Medicare HMO | Admitting: Internal Medicine

## 2010-11-04 DIAGNOSIS — R197 Diarrhea, unspecified: Secondary | ICD-10-CM

## 2010-11-04 NOTE — Progress Notes (Signed)
  Subjective:    Patient ID: [name], female    DOB: [birth year], 70 y.o.   MRN: [id number]  HPI [name] present for evaluation of persistent diarrhea for approx 3 weeks. She did have fracture pubic ramus and was hospitalized and the transferred to SNF. She did have a UTI in the SNF and was subsequently seen for recurrent/persistent UTI such that she had two courses of antibtiotics. Her symptoms are temporally related. By report the stools are liquid, without blood or mucus. She has not had fever, abdominal pain, rigors, N/V. She has had a good appetite. She has been drinking some fluids.  PMH, FamHx and SocHx reviewed for any changes and relevance.    Review of Systems    Review of Systems  Constitutional:  Negative for fever, chills, activity change and unexpected weight change.  HENT:  Negative for hearing loss, ear pain, congestion, neck stiffness and postnasal drip.   Eyes: Negative for pain, discharge and visual disturbance.  Respiratory: Negative for chest tightness and wheezing.   Cardiovascular: Negative for chest pain and palpitations.       [No decreased exercise tolerance Gastrointestinal: per the HPI Genitourinary: Negative for urgency, frequency, flank pain and difficulty urinating.  Musculoskeletal: Negative for myalgias, back pain, arthralgias. She conrtinues to use a walker and does have some discomfort left pelvis with full weight bearing.   Neurological: Negative for dizziness, tremors, weakness and headaches. Mild residual dysarthria from CVA Hematological: Negative for adenopathy.  Psychiatric/Behavioral: Negative for behavioral problems and dysphoric mood.    Objective:   Physical Exam WNWD white woman in no distress HEENT- nl Chest - clear Abd- soft, BS +       Assessment & Plan:  1. Diarrhea - patient with persistent diarrhea: her symptoms are not classic for c.diff but she has had exposure to antibiotics. She was also in a SNF raising the  possibility of infectioius diarrhea - norwalk virus or bacterial diarrhea  Plan - stool for c.diff toxin, shigella, salmonella, campylobacter, WBCs/leukoferrin            Flagyl 500mg  tid x 10 days            Hydration - 6-8 oz after each loose stool

## 2010-11-05 LAB — FECAL LACTOFERRIN, QUANT: Lactoferrin: POSITIVE

## 2010-11-09 ENCOUNTER — Telehealth: Payer: Self-pay | Admitting: *Deleted

## 2010-11-09 NOTE — Telephone Encounter (Signed)
Called patient: having mucus and blood with BM, small volume. She is keeping down fluids. She has mild to moderate LQ pain, no documented fever. She denies feeling light-headed. Stool + for lactoferrin, neg routine bacteria. C. Diff toxin test was cancelled.  Plan - continue with flagyl. Report back. Low threshold to admit if dehydration symptoms occur.

## 2010-11-09 NOTE — Telephone Encounter (Signed)
Patient req advisement, she now has bleeding and puss from rectum.

## 2010-11-12 LAB — CLOSTRIDIUM DIFFICILE TOXIN: C diff Toxin A: NOT DETECTED

## 2010-11-14 DIAGNOSIS — I10 Essential (primary) hypertension: Secondary | ICD-10-CM

## 2010-11-14 DIAGNOSIS — T148XXA Other injury of unspecified body region, initial encounter: Secondary | ICD-10-CM

## 2010-11-14 DIAGNOSIS — R269 Unspecified abnormalities of gait and mobility: Secondary | ICD-10-CM

## 2010-11-15 ENCOUNTER — Telehealth: Payer: Self-pay | Admitting: *Deleted

## 2010-11-15 DIAGNOSIS — R197 Diarrhea, unspecified: Secondary | ICD-10-CM

## 2010-11-15 NOTE — Telephone Encounter (Signed)
NO answer, NO VM x 3 attempts.

## 2010-11-15 NOTE — Telephone Encounter (Signed)
1. Needs repeat stool for c.diff 2. If no slowing of diarrhea with flagyl will not prescribe more antibiotic until culture back 3. May at this time start using metamucil twice a day to slow diarrhea 4. Keep up fluid replacement

## 2010-11-15 NOTE — Telephone Encounter (Signed)
Still c/o watery diarrhea and abd cramping. Pt denies blood or puss in stool and fever. She completed Rx Friday am.  He states Dr. Debby Bud mentioned collecting stool studies if symptoms persisted. Also, he is asking whether she needs another round of meds. Please advise.

## 2010-11-16 NOTE — Telephone Encounter (Signed)
Patient informed, Stool order placed

## 2010-11-17 ENCOUNTER — Other Ambulatory Visit: Payer: Medicare HMO

## 2010-11-17 DIAGNOSIS — R197 Diarrhea, unspecified: Secondary | ICD-10-CM

## 2010-11-18 ENCOUNTER — Encounter: Payer: Self-pay | Admitting: *Deleted

## 2010-11-18 ENCOUNTER — Telehealth: Payer: Self-pay | Admitting: *Deleted

## 2010-11-18 ENCOUNTER — Telehealth: Payer: Self-pay | Admitting: Gastroenterology

## 2010-11-18 NOTE — Telephone Encounter (Signed)
Spoke w/Ms Paschal Dopp to inform her that we are waiting on results of C. Diff labs and that she should continue w/Dr Norins instructions until then. Continue: metamucil bid, fluid intake [low in sugar], clear liquids/broth

## 2010-11-18 NOTE — Telephone Encounter (Signed)
Pt saw Dr Debby Bud on 11/05/10 for diarrhea x 3 weeks. Cdiff, Stool cultures neg, Lactoferrin pos.  Pt hospitalized in March, 2012 for FX Pubic Ramos and has been in Nursing Home or OP rehab since. Hx of adenomatous colon polyps; last COLON 11/09/2009 by Dr Jarold Motto, normal. Pt instructed to use Metamucil by Dr Debby Bud, but she thinks her diarrhea is worse. Now pt reports she's passing blood and pus, Pt instructed to follow BRAT diet, given NP appt tomorrow at 0900 with Dr Jarold Motto.

## 2010-11-19 ENCOUNTER — Ambulatory Visit: Payer: Medicare HMO | Admitting: Gastroenterology

## 2010-11-19 ENCOUNTER — Telehealth: Payer: Self-pay | Admitting: *Deleted

## 2010-11-19 NOTE — Telephone Encounter (Signed)
Spoke with pt to ask why she missed her appt. Pt misunderstood and thought she was to call me if she wanted the appt. Explained to pt she would be charged a late fee of $50; pt verbalized understanding.

## 2010-11-19 NOTE — Telephone Encounter (Addendum)
Pt's husband Gala Romney called to report pt has Dementia and he makes all her appointments. Yesterday she was at her wit's end with 3 weeks of diarrhea, and reacted and called [place]. Pt thought I wanted her to call if she wanted the appt. Husband doesn't think the late fee is necessary.   Spoke with Dr Jarold Motto and informed Gala Romney he will waive the late fee. Pt may call is she needs f/u with [place]. Gala Romney stated understanding.

## 2010-11-19 NOTE — Assessment & Plan Note (Signed)
[facility]                           PRIMARY CARE OFFICE NOTE   NAME:[name], [name] CURRIN                   MRN:          [id number]  DATE:10/16/2006                            DOB:          [birth year]    Ms. Lindenberger was seen as a new patient on September 27, 2006, please see that  complete detailed dictation.  This was reviewed with the patient.  Two  corrections; One, her GYN doctor is Dr. Randell Patient, her GI doctor is Dr.  Jarold Motto.  No other additions, corrections were needed or made.   In the interval since the patient was last seen, she seems to have been  doing well.  I am concerned in regards to reports from her husband that  she does seem to have some emotional changes since her stroke, including  some paranoid thinking in regards to his fidelity, as well as being  somewhat irritable.   CURRENT MEDICATIONS:  Unchanged from her previous visit.  Please see  that list.   REVIEW OF SYSTEMS:  Negative for any constitutional, cardiovascular,  respiratory, GI, or GU complaints.   PHYSICAL EXAMINATION:  Temperature was 97.2, blood pressure 155/84,  pulse 91, weight 137.  GENERAL APPEARANCE:  A well-nourished, well-groomed woman, she is in no  acute distress.  She is able to get to the exam table without  difficulty, although she does have some mild ataxia.  HEENT EXAM:  Normocephalic, atraumatic.  EACs and TMs were normal.  Oropharynx revealed the patient to have a full lower denture which was  in place.  Native dentition on the maxilla.  There were no buccal  lesions.  Posterior pharynx was clear.  I saw no obvious radiation  changes, although denture was not removed.  Conjunctivae and sclerae  were clear.  Pupils were equal, round, and reactive to light and  accommodation.  Funduscopic exam with a handheld instrument revealed  normal disc margins with no abnormalities noted.  NECK:  Patient had significant scarring on the left side of her neck,  and a  loss of tissue mass from a radical neck dissection.  Patient has a  well-healed carotid endarterectomy scar on the right.  NODES:  No adenopathy was noted in the submandibular, cervical,  supraclavicular, or axillary regions.  CHEST:  No CVA tenderness.  LUNGS:  Clear to auscultation and percussion.  BREAST EXAM:  Deferred to her oncologist and surgeon.  CARDIOVASCULAR:  Patient had a 2/6 bruit over the left carotid artery.  She had 2+ radial pulses.  She had a quiet precordium.  Her heart rate  was regular with no murmurs, rubs, or gallops.  ABDOMEN:  Soft, no guarding, no rebound.  No organosplenomegaly was  noted.  PELVIC/RECTAL EXAM:  Deferred.  EXTREMITIES:  Patient has subluxation of the left thumb.  No other  significant joint enlargement or deformity is noted.  There is no  synovial thickening, no erythema.  She has good range of motion about  all of her minor and major joints.  NEURO:  Patient has very slightly slurred speech.  She has a mild  ataxia, favoring her  right leg.  SKIN:  Clear with some radiation changes to the left.   ASSESSMENT AND PLAN:  1. Neuro.  Patient with history of cerebrovascular disease with a CVA.      She has minimal sequelae with some mild thickening of her speech      and ataxia.  She does have a known seizure disorder following her      craniectomy for subdural hematoma evacuation.  She has had no      seizure activity in the recent past.  Plan is to send the patient for phenobarbital and dilantin level.  1. Peripheral vascular disease.  Patient with a bruit on the left.      She cannot recall when she last had a carotid Doppler, but thinks      it is over a year.  Plan is to refer the patient back to [facility] who      did her surgery for followup carotid Dopplers.  I did contact the      office and they will contact the patient with an appointment time.  2. Hypothyroid disease.  The patient's last laboratory was      approximately four months ago.   Plan is to have the thyroid      function rechecked, TSH and free T4, adjust her medications as      indicated.  The patient's other medical problems as outlined in my      initial note are stable including, hypercholesterolemia,      osteoporosis, and urinary incontinence.  The patient has been asked      to return to see me in 4 months, or on a p.r.n. basis.     Rosalyn Gess Norins, MD  Electronically Signed    MEN/MedQ  DD: 10/17/2006  DT: 10/17/2006  Job #: [id number]   cc:   Ms. Johnnye Lana

## 2010-11-19 NOTE — Op Note (Signed)
[name], [name]            ACCOUNT NO.:  [id number]   MEDICAL RECORD NO.:  [id number]          PATIENT TYPE:  AMB   LOCATION:  SDS                          FACILITY:  [facility]   PHYSICIAN:  Anselm Pancoast. Weatherly, M.D.DATE OF BIRTH:  31-Mar-1941   DATE OF PROCEDURE:  09/06/2005  DATE OF DISCHARGE:  09/06/2005                                 OPERATIVE REPORT   PREOPERATIVE DIAGNOSES:  1.  Carcinoma of the left breast upper outer quadrant.  2.  History of recent carcinoma of the tongue, with bilateral neck      dissection.  3.  Distant history of cancer of the right breast.   POSTOPERATIVE DIAGNOSES:  1.  Carcinoma of the left breast upper outer quadrant.  2.  History of recent carcinoma of the tongue, with bilateral neck      dissection.  3.  Distant history of cancer of the right breast.   OPERATION:  Left partial mastectomy and sentinel node needle localization  partial mastectomy.   ANESTHESIA:  General.   HISTORY:  [name] is a 70 year old female, who is an old patient of  mine, who approximately in 1999 had a partial mastectomy for papillary  carcinoma, originally a lumpectomy, but because of the path in question, she  ultimately underwent a total mastectomy on the right and axillary  dissection. Dr. Catha Gosselin was her oncologist and she received Arimidex for  approximately 5 years, and this was stopped approximately a year and a half  ago. The patient did have a reconstruction, but because of complications,  the reconstruction was taken down and she has had no evidence of any  recurrence on the right. She recently has had a carcinoma of the tongue and  underwent a partial glossectomy and a left radical neck dissection. She had  a reoccurrence and was reoperated on, I think, Rowan Blase in December. She  had had a screening mammogram by Dr. Isaiah Serge, I think, in November and this  showed a questionable area that was needle localized to confirm that this  was a  carcinoma, and at that time she was proceeded on with a right neck  dissection and re-excision of the primary in her tongue, and we recommended  that she proceed on with that, which she did and has done well. She has seen  Dr. Mancel Bale. Originally, she was kind of oscillating on whether to go  ahead and have a mastectomy, but because of this tumor being small and  breast conservation, she has elected, if possible, to have a lumpectomy,  sentinel node and radiation therapy. As far as the present tumor, it is an  invasive carcinoma that is estrogen, I think, and progesterone sensitive. 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 The patient has had really no  change on the breast exam in this approximately 2 months, while she was  completing her carcinoma of the neck surgery, etc. The tumor itself is still  really not palpable on exam. It is fairly superficial in the left breast  upper outer quadrant, but as far as actually feeling a definite obvious  mass, it has never been an obvious palpable mass. This morning, she had a  needle localization by Dr. Isaiah Serge first. The lesion is approximately at the  1:30 to 2 o'clock position, fairly superficial, and then the injection of  the nuclear meds for the sentinel node study.   DESCRIPTION OF PROCEDURE:  Surgery was about 30 minutes late getting  started, and she was positioned on the OR table. Induction of general  anesthesia. The endotracheal tube was placed under endoscopic guidance and  worked very well. She did nicely from the general surgery, from the  anesthesia standpoint. In the area first, I had used the [employer] and  kind of identified where the sentinel node should be. It was right under the  lateral aspect of the pectoralis major. After induction of general  anesthesia and prepping the breast, I did inject the methylene blue,  approximately 2 cc, right under the areola. We then draped her in a sterile  manner,  prepping up under the axilla. First, I elected to go ahead and do  the sentinel node. I made a little transverse incision, dissected down,  getting into the lower aspect of the axilla. The Clarisa Kindred counter was very  specific that the blue node, that was not an extremely large node, was the  hot area. In these 2 little areas - one was picked up and then there was  another little hot area in the surrounding tissue, and I removed or pulled  all the various areas up, and removed probably about a pecan size of  axillary contents, that actually had 2 and possibly a third node too that  were radiologically hot. One was a little bluer than the other, but both  picked up the blue dye. The surrounding background count now was 0 in the  axilla. The hemostasis was with 4-0 Vicryl's and Hemoclips. I placed a  little wet sponge in the operative area. I did not close it, while we  proceeded on with the partial mastectomy. The Guidewire had been placed  pretty close to the areolar edge and I made a little incision obliquely  across  where Dr. Isaiah Serge had been marked with an X the central tumor  location. I then elevated the flaps superiorly and inferiorly, going down so  the little Guidewire could be brought into the wound. I then used  predominantly cautery and also scissors dissection, freeing up the  surrounding areas so about a walnut size mass of breast tissue was removed.  The little wire had gone through the mass and the hook was up towards the  axilla. I actually snipped off just the end of the wire and removed it from  the field. Hemostasis was obtained with either sutures of 4-0 Vicryl or  clips. Really, we removed most of the breast tissue right on down to the  pectoral fascia, because she has very small breast on this area. Next, the  defect after good hemostasis was closed in 2 layers of 4-0 Vicryl, and then  I elected to close the skin with 4-0 nylon simple sutures, because really the  subareolar tissue right under the skin had been removed. Next, the  axilla was thoroughly irrigated with good hemostasis. I closed this also in  2 layers with 4-0 Vicryl's, and then 4-0 nylon simple sutures.   The patient stated that she desired to be discharged if she was not having  any problems with her breathing, because of the head and neck previous  tumors, and we will make that decision in an hour or so after she is in the  recovery room. Dr. Clelia Croft looked at the sentinel nodes and said that there  were some histiocytes, but she thought that these were all negative from  metastatic tumor. Dr. Isaiah Serge  looked at the specimen and said it certainly  was in the specimen and appeared with good margins, but the pathologist will  ultimately determine that.           ______________________________  Anselm Pancoast. Zachery Dakins, M.D.     WJW/MEDQ  D:  09/06/2005  T:  09/07/2005  Job:  [id number]   cc:   Jasmine Pang  Fax: [phone]   Leighton Roach. Truett Perna, M.D.  Fax: [phone]

## 2010-11-25 ENCOUNTER — Encounter: Payer: Self-pay | Admitting: Internal Medicine

## 2010-11-26 ENCOUNTER — Encounter: Payer: Self-pay | Admitting: Gastroenterology

## 2010-11-26 ENCOUNTER — Other Ambulatory Visit (INDEPENDENT_AMBULATORY_CARE_PROVIDER_SITE_OTHER): Payer: Medicare HMO

## 2010-11-26 ENCOUNTER — Ambulatory Visit (INDEPENDENT_AMBULATORY_CARE_PROVIDER_SITE_OTHER): Payer: Medicare HMO | Admitting: Internal Medicine

## 2010-11-26 ENCOUNTER — Telehealth: Payer: Self-pay | Admitting: Gastroenterology

## 2010-11-26 VITALS — BP 146/78 | HR 75 | Temp 97.9°F | Wt 130.0 lb

## 2010-11-26 DIAGNOSIS — Z8679 Personal history of other diseases of the circulatory system: Secondary | ICD-10-CM

## 2010-11-26 DIAGNOSIS — R5383 Other fatigue: Secondary | ICD-10-CM

## 2010-11-26 DIAGNOSIS — Z0000 Encounter for general adult medical examination without abnormal findings: Secondary | ICD-10-CM

## 2010-11-26 DIAGNOSIS — R197 Diarrhea, unspecified: Secondary | ICD-10-CM

## 2010-11-26 DIAGNOSIS — E039 Hypothyroidism, unspecified: Secondary | ICD-10-CM

## 2010-11-26 DIAGNOSIS — R5381 Other malaise: Secondary | ICD-10-CM

## 2010-11-26 LAB — CBC WITH DIFFERENTIAL/PLATELET
Basophils Absolute: 0 10*3/uL (ref 0.0–0.1)
Eosinophils Absolute: 0.3 10*3/uL (ref 0.0–0.7)
Lymphocytes Relative: 18.1 % (ref 12.0–46.0)
MCHC: 34.6 g/dL (ref 30.0–36.0)
MCV: 95.8 fl (ref 78.0–100.0)
Monocytes Absolute: 0.9 10*3/uL (ref 0.1–1.0)
Neutrophils Relative %: 63.9 % (ref 43.0–77.0)
Platelets: 225 10*3/uL (ref 150.0–400.0)
RDW: 14.1 % (ref 11.5–14.6)

## 2010-11-26 LAB — PHENOBARBITAL LEVEL: Phenobarbital: 32.3 ug/mL (ref 15.0–40.0)

## 2010-11-26 LAB — PHENYTOIN LEVEL, TOTAL: Phenytoin Lvl: 14.8 ug/mL (ref 10.0–20.0)

## 2010-11-26 MED ORDER — ROSUVASTATIN CALCIUM 20 MG PO TABS: 20.0000 mg | ORAL_TABLET | Freq: Every day | ORAL | Status: DC

## 2010-11-26 NOTE — Progress Notes (Signed)
Subjective:    Patient ID: [name], female    DOB: 1941/05/27, 70 y.o.   MRN: [id number]  HPI  The patient is here for annual Medicare wellness examination and management of other chronic and acute problems. She continues to have diarrhea with blood. She has had c. Diff specimens that were negative x 3, routine stool cultures were negative, lactoferrin was positive. She continues to take metamucil to no avail. She has seen oral-oncologist at [facility] and she is doing well, no signs of recurrent cancer.    The risk factors are reflected in the social history.  The roster of all physicians providing medical care to patient - is listed in the Snapshot section of the chart.  Activities of daily living:  The patient is 100% inedpendent in all ADLs: dressing, toileting, feeding as well as independent mobility. She does not drive. She has not had any fall but is at increased risk and she does use a rolling walker while she recovers from a pelvic ramus fracture.   Home safety : The patient has smoke detectors in the home. They wear seatbelts. firearms are present in the home, kept in a safe fashion. There is no violence in the home.   There is no risks for hepatitis, STDs or HIV. There is no   history of blood transfusion. They have no travel history to infectious disease endemic areas of the world.  The patient has not seen their dentist in the last six month. They have seen their eye doctor in the last year. They deny any hearing difficulty and have not had audiologic testing in the last year.  They do not  have excessive sun exposure. Discussed the need for sun protection: hats, long sleeves and use of sunscreen if there is significant sun exposure.   Diet: the importance of a healthy diet is discussed. They do have a healthy (unhealthy-high fat/fast food) diet.  Exercise - she is doing PT for recovery.   Counseling provided re: sun exposure, fall risk, healthy diet.   No signs of  depression: no sadness, no anhedonia., she has been sleeping more, good appetite.    Past Medical History  Diagnosis Date  . Peripheral vascular disease   . Subdural hematoma   . Ataxia   . Mild dysplasia of cervix   . UTI (lower urinary tract infection)   . Urinary incontinence   . Seizures   . Cerebrovascular disease   . Hypothyroidism   . Personal history of colonic polyps     adenomatous 1997 & tubular adenoma and hyplastic  2008  . Hyperlipidemia   . Adenocarcinoma, breast     bilateral  . H/O alcohol abuse   . Diverticulosis of colon (without mention of hemorrhage)   . Redundant colon   . Squamous cell carcinoma of mouth    Past Surgical History  Procedure Date  . Subdural hematoma evacuation via craniotomy   . 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 Multiple tooth extractions     due to oral cancer  . Dilation and curettage of uterus   . Cataract extraction, bilateral   . Breast lumpectomy     left breast with radiation therapy  . Carotid endarterectomy     left  . Mastectomy     Right, history with nodule dissection   Family History  Problem Relation Age of Onset  . Coronary artery disease Mother     w/ pacemaker  . Diabetes  Sister   . Cancer Other     breast   History   Social History  . Marital Status: Married    Spouse Name: N/A    Number of Children: 2  . Years of Education: N/A   Occupational History  . retired     Sales executive   Social History Main Topics  . Smoking status: Not on file  . Smokeless tobacco: Not on file  . Alcohol Use:   . Drug Use:   . Sexually Active:    Other Topics Concern  . 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          Review of Systems Review of Systems  Constitutional:  Negative for fever, chills, activity change and unexpected weight change.  HENT:  Negative for hearing loss, ear pain, congestion, neck stiffness and postnasal drip.   Eyes: Negative for pain, discharge and visual disturbance.  Respiratory: Negative for chest tightness and wheezing.   Cardiovascular: Negative for chest pain and palpitations.       [No decreased exercise tolerance Gastrointestinal: [No change in bowel habit. No bloating or gas. No reflux or indigestion Genitourinary: Negative for urgency, frequency, flank pain and difficulty urinating.  Musculoskeletal: Negative for myalgias, back pain, arthralgias and gait problem.  Neurological: Negative for dizziness, tremors, weakness and headaches.  Hematological: Negative for adenopathy.  Psychiatric/Behavioral: Negative for behavioral problems and dysphoric mood.       Objective:   Physical Exam        Assessment & Plan:

## 2010-11-26 NOTE — Progress Notes (Signed)
Subjective:    Patient ID: [name], female    DOB: [birth year], 70 y.o.   MRN: [id number]  HPI The patient is here for annual Medicare wellness examination and management of other chronic and acute problems. She is making a good recovery from a fractured public rami. She has no other medical complaints.   The risk factors are reflected in the social history.  The roster of all physicians providing medical care to patient - is listed in the Snapshot section of the chart.  Activities of daily living:  The patient is 100% inedpendent in all ADLs: dressing, toileting, feeding. For mobility she uses a roShe has had alling walker but hopes to be cleared for independent ambulation once her fracture heals.   Home safety : The patient has smoke detectors in the home. They wear seatbelts.No firearms at home. There is no violence in the home.   There is no risks for hepatitis, STDs or HIV. There is no   history of blood transfusion. They have no travel history to infectious disease endemic areas of the world.  The patient has seen their dentist in the last six month. They have seen their eye doctor in the last year. They deny any hearing difficulty and have not had audiologic testing in the last year.  They do not  have excessive sun exposure. Discussed the need for sun protection: hats, long sleeves and use of sunscreen if there is significant sun exposure.   Diet: the importance of a healthy diet is discussed. They do have a healthy  diet.  The patient has a no regular exercise program except for PT related to her fracture.  The benefits of regular aerobic exercise were discussed.  Depression screen: there are no signs or vegative symptoms of depression- irritability, change in appetite, anhedonia, sadness/tearfullness.  Cognitive assessment: the patient manages all their financial and personal affairs and is actively engaged. They could relate day,date,year and events; recalled 3/3 objects  at 3 minutes; performed clock-face test normally.  The following portions of the patient's history were reviewed and updated as appropriate: allergies, current medications, past family history, past medical history,  past surgical history, past social history  and problem list.  Vision, hearing, body mass index were assessed and reviewed.   During the course of the visit the patient was educated and counseled about appropriate screening and preventive services including : fall prevention , diabetes screening, nutrition counseling, colorectal cancer screening, and recommended immunizations.  Past Medical History  Diagnosis Date  . Peripheral vascular disease   . Subdural hematoma   . Ataxia   . Mild dysplasia of cervix   . UTI (lower urinary tract infection)   . Urinary incontinence   . Seizures   . Cerebrovascular disease   . Hypothyroidism   . Personal history of colonic polyps     adenomatous 1997 & tubular adenoma and hyplastic  2008  . Hyperlipidemia   . Adenocarcinoma, breast     bilateral  . H/O alcohol abuse   . Diverticulosis of colon (without mention of hemorrhage)   . Redundant colon   . Squamous cell carcinoma of mouth    Past Surgical History  Procedure Date  . Subdural hematoma evacuation via craniotomy   . 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 Multiple tooth extractions     due to oral cancer  . Dilation and curettage of uterus   . Cataract extraction, bilateral   . Breast  lumpectomy     left breast with radiation therapy  . Carotid endarterectomy     left  . Mastectomy     Right, history with nodule dissection   Family History  Problem Relation Age of Onset  . Coronary artery disease Mother     w/ pacemaker  . Diabetes Sister   . Cancer Other     breast   History   Social History  . Marital Status: Married    Spouse Name: N/A    Number of Children: 2  . Years of Education: N/A   Occupational History  . retired     Neurosurgeon   Social History Main Topics  . Smoking status: Former Games developer  . Smokeless tobacco: Never Used  . Alcohol Use: No  . Drug Use: No  . Sexually Active: Not Currently   Other Topics Concern  . Not on file   Social History Narrative   HSG. Married x 7 years divorced; married 2067-11-28. 1 son- died as a neonate, 1 son- 11-28-2058. Retired- worked as a Sales executive.   End of life: has a living will- does not want futile/ heroic care; no cpr.  Marriage- reports marriage is in good health (4/10)      Review of Systems Review of Systems  Constitutional:  Negative for fever, chills, activity change and unexpected weight change.  HENT:  Negative for hearing loss, ear pain, congestion, neck stiffness and postnasal drip.   Eyes: Negative for pain, discharge and visual disturbance.  Respiratory: Negative for chest tightness and wheezing.   Cardiovascular: Negative for chest pain and palpitations.       No decreased exercise tolerance Gastrointestinal: No change in bowel habit. No bloating or gas. No reflux or indigestion Genitourinary: Negative for urgency, frequency, flank pain and difficulty urinating.  Musculoskeletal: Negative for myalgias, back pain, arthralgias and gait problem.  Neurological: Negative for dizziness, tremors, weakness and headaches.  Hematological: Negative for adenopathy.  Psychiatric/Behavioral: Negative for behavioral problems and dysphoric mood.       Objective:   Physical Exam Vitals noted Gen'l - WNWD older white woman in no acute distress. HEENT- scar necklace distribution both sides, well healed; oropharynx without lesions, tongue normal, buccal membranes normal. Cerumen impaction left EAC. Right EAC and TM normal Node- negative submandibular, cervical, supra-clavicular Chest - no cvat, no deformity Lungs - clear to auscultation Cor - RRR withoiut murmur Abdomen - soft Breast exam - deferred to normal mammogram Pelvic/rectal - deferred Extremity -  displace left 1st MCP joint. Mild stiffness digits without synovial thickening. Able to ambulate with the use of the walker Neuro - A&O to person, place. She is oriented to day, date and year.   Lab Results  Component Value Date   WBC 6.9 11/26/2010   HGB 13.3 11/26/2010   HCT 38.5 11/26/2010   PLT 225.0 11/26/2010   CHOL  Value: 137        ATP III CLASSIFICATION:  <200     mg/dL   Desirable  811-914  mg/dL   Borderline High  >=782    mg/dL   High        9/56/2130   TRIG 50 09/17/2010   HDL 88 09/17/2010   ALT 18 8/65/7846   AST 23 09/16/2010   NA 136 09/18/2010   K 4.1 09/18/2010   CL 101 09/18/2010   CREATININE 0.63 09/18/2010   BUN 7 09/18/2010   CO2 29 09/18/2010   TSH 1.36 11/26/2010  INR 1.18 09/16/2010          Assessment & Plan:  1. Pubic Rami fracture - she is making progress. She is able to do weight bearing with ambulation but still requires the use of rolling walker.   Plan - per orthopedics  2. Hyperlipidemia - labs done in March reveal excellent control.  Plan - continue present regimen  3. Hypothyroidism - TSH in normal range on present regimen. Will continue the same.  4. Hypertension - adequate control on present medication.  5. Oral cancer - she has been seen recently at [facility] and has no evidence of recurrent cancer.   6. Health maintenance - interval history as noted with fall and fracture but medically very stable. Limited physical exam very stable. Lab results as noted indicate good control of chronic medical problems. She is current with mammography with last study Jul 15, 2010. She is current with colorectal cancer screening with last study May '11. Immunizations: Tdap April '11; Pnuemonia vaccine April '11. She has had shingles - not a candidate for vaccine.   In summary - a nice woman who is medically stable, recovering from a pelvic fracture. She is oncologically stable and doing well. She will return on an as needed basis.

## 2010-11-26 NOTE — Telephone Encounter (Signed)
[name] will notify pt's husband with the appt on 11/30/10 at 10:00am.

## 2010-11-28 ENCOUNTER — Encounter: Payer: Self-pay | Admitting: Internal Medicine

## 2010-11-29 ENCOUNTER — Encounter: Payer: Self-pay | Admitting: Internal Medicine

## 2010-11-30 ENCOUNTER — Encounter: Payer: Self-pay | Admitting: Physician Assistant

## 2010-11-30 ENCOUNTER — Ambulatory Visit (INDEPENDENT_AMBULATORY_CARE_PROVIDER_SITE_OTHER): Payer: Medicare HMO | Admitting: Physician Assistant

## 2010-11-30 ENCOUNTER — Other Ambulatory Visit: Payer: Medicare HMO

## 2010-11-30 VITALS — BP 142/64 | HR 80 | Ht 65.0 in | Wt 128.2 lb

## 2010-11-30 DIAGNOSIS — R197 Diarrhea, unspecified: Secondary | ICD-10-CM

## 2010-11-30 DIAGNOSIS — K625 Hemorrhage of anus and rectum: Secondary | ICD-10-CM

## 2010-11-30 MED ORDER — SACCHAROMYCES BOULARDII 250 MG PO CAPS: 250.0000 mg | ORAL_CAPSULE | Freq: Two times a day (BID) | ORAL | Status: DC

## 2010-11-30 NOTE — Progress Notes (Signed)
Subjective:    Patient ID: [name], female    DOB: [birth year], 70 y.o.   MRN: [id number]  HPI [name] is a 70 year old female known to Dr. Jarold Motto who underwent screening colonoscopy approximately one year ago. This was a negative exam. She does have history of adenomatous polyps previously. At this time she is referred by Dr. Debby Bud   for evaluation of 5-6 week history of persistent diarrhea. Patient had sustained a pelvic fracture in March of 2012 and required hospitalization in any rehabilitation stay [facility] the [facility]. Her husband reports that around that same time she also had a UTI and was started on antibiotics. Her husband also believes that she had another course of antibiotics while she was at the nursing home. Her diarrhea started in mid April and has persisted ever since. At this time she is having about 10 bowel movements per day- loose to watery, urgent and with occasional incontinence. She is also having nocturnal episodes of diarrhea. She complains of nausea intermittently without vomiting. She has not had any fever or sweats and no significant weight loss. Initially she was seeing some blood on the tissue from hemorrhoids but over the past couple weeks has been seeing some bright red blood mixed in with the diarrhea at times.  Patient underwent stool cultures stool for WBC and C. difficile per Dr. Debby Bud  and is.  Stool for lactoferrin was positive, stool for C. difficile toxin  A -negative. She was treated with an empiric 10 day regimen of Flagyl without any significant change in her diarrhea and completed that about 2 weeks ago. She has not been on any other new medications. Most recent labs 11/26/2010 WBC was 6.9 hemoglobin 13.3 hematocrit of 38.5 TSH of 1.36 phenobarbital level normal at 32.3.    Review of Systems  Constitutional: Positive for appetite change and fatigue.  HENT: Negative.   Eyes: Negative.   Respiratory: Negative.   Gastrointestinal: Positive  for abdominal pain, diarrhea and blood in stool.  Genitourinary: Negative.   Musculoskeletal: Positive for gait problem.  Skin: Negative.   Neurological: Positive for weakness.  Hematological: Negative.   Psychiatric/Behavioral: Negative.    Outpatient Prescriptions Prior to Visit  Medication Sig Dispense Refill  . alendronate (FOSAMAX) 70 MG tablet Take 70 mg by mouth every 7 (seven) days. Take with a full glass of water on an empty stomach.       Marland Kitchen aspirin EC 325 MG EC tablet Take 1 tablet (325 mg total) by mouth daily.  100 tablet  3  . CARTIA XT 180 MG 24 hr capsule TAKE ONE CAPSULE BY MOUTH DAILY  30 capsule  1  . diltiazem (CARDIZEM CD) 180 MG 24 hr capsule Take 1 capsule (180 mg total) by mouth daily.  90 capsule  2  . diltiazem (CARDIZEM CD) 180 MG 24 hr capsule Take 1 capsule (180 mg total) by mouth daily.  30 capsule  1  . folic acid (FOLVITE) 1 MG tablet Take 1 mg by mouth daily.        Marland Kitchen levothyroxine (LEVOTHROID) 50 MCG tablet Take 50 mcg by mouth daily.        Marland Kitchen PHENobarbital (LUMINAL) 97.2 MG tablet Take 97.2 mg by mouth 2 (two) times daily.        . phenytoin (DILANTIN) 100 MG ER capsule Take by mouth 3 (three) times daily.        . rosuvastatin (CRESTOR) 20 MG tablet Take 1 tablet (20 mg total)  by mouth at bedtime.  90 tablet  3  . tamoxifen (NOLVADEX) 20 MG tablet Take 20 mg by mouth 2 (two) times daily.             Objective:   Physical Exam Well-developed elderly white female in no acute distress, pleasant, ambulates with a walker, accompanied by her husband. HEENT; nontraumatic normocephalic EOMI PERRLA sclera anicteric  Neck; supple no JVD  Cardiovascular; regular rate and rhythm with S1-S2, soft murmur  Pulmonary; clear bilaterally  Abdomen; soft nondistended basically nontender, no palpable mass or hepatosplenomegaly, bowel sounds active  Rectal; not done Extremities/skin no clubbing cyanosis or edema Psych; mood and affect normal and appropriate         Assessment & Plan:  #106 70 year old female with 5-6 week history of persistent diarrhea intermittent hematochezia and nausea. Despite negative C. difficile toxin  A; her illness is still very concerning for C. difficile induced colitis.  Plan Check stool for C. difficile by PCR today If this is negative, will schedule for flexible sigmoidoscopy within the next couple of days for diagnosis. If PCR is positive she will need to be treated with oral vancomycin for at least 14 days Start florastor  one by mouth twice daily x30 days Continued avoidance of antidiarrheals

## 2010-11-30 NOTE — Patient Instructions (Signed)
We sent a prescription for Florastor. Please go to the basement level to have your labs drawn.

## 2010-12-01 LAB — CLOSTRIDIUM DIFFICILE BY PCR: Toxigenic C. Difficile by PCR: NOT DETECTED

## 2010-12-01 NOTE — Progress Notes (Signed)
I agree with Ms. Esterwood's assessment and plan. 

## 2010-12-03 ENCOUNTER — Ambulatory Visit (AMBULATORY_SURGERY_CENTER): Payer: Medicare HMO | Admitting: Gastroenterology

## 2010-12-03 ENCOUNTER — Other Ambulatory Visit: Payer: Self-pay | Admitting: *Deleted

## 2010-12-03 ENCOUNTER — Encounter: Payer: Self-pay | Admitting: Gastroenterology

## 2010-12-03 ENCOUNTER — Telehealth: Payer: Self-pay | Admitting: Physician Assistant

## 2010-12-03 DIAGNOSIS — K625 Hemorrhage of anus and rectum: Secondary | ICD-10-CM | POA: Insufficient documentation

## 2010-12-03 DIAGNOSIS — K5289 Other specified noninfective gastroenteritis and colitis: Secondary | ICD-10-CM

## 2010-12-03 DIAGNOSIS — R197 Diarrhea, unspecified: Secondary | ICD-10-CM

## 2010-12-03 DIAGNOSIS — R195 Other fecal abnormalities: Secondary | ICD-10-CM

## 2010-12-03 DIAGNOSIS — K529 Noninfective gastroenteritis and colitis, unspecified: Secondary | ICD-10-CM

## 2010-12-03 MED ORDER — DISPOSABLE ENEMA 19-7 GM/118ML RE ENEM
1.0000 | ENEMA | Freq: Once | RECTAL | Status: AC
Start: 2010-12-03 — End: 2010-12-03

## 2010-12-03 MED ORDER — PREDNISONE 20 MG PO TABS: ORAL_TABLET | ORAL | Status: DC

## 2010-12-03 MED ORDER — MESALAMINE 1000 MG RE SUPP: 1000.0000 mg | Freq: Every day | RECTAL | Status: DC

## 2010-12-03 MED ORDER — SODIUM CHLORIDE 0.9 % IV SOLN: 500.0000 mL | INTRAVENOUS | Status: DC

## 2010-12-03 NOTE — Telephone Encounter (Signed)
Pt called me back and I advised his wife would like to come this afternoon for the Flex Sig with Dr. Jarold Motto.  He will bring her at 3:00 PM and thanked [place] for fitting her in. I advised him they will give her a Fle[employer] Enema when she gets here.

## 2010-12-03 NOTE — Telephone Encounter (Signed)
Pt's husband called wanting results of the CDiff by PCR.. I advised the results were negative.  I asked how she was doing and he said she is not much better.  She has been taking the [employer].  She is still having 5-6 diarrhea, water, soft stools with blood.   Amy noted in her progress notes when she saw the patient on 5-29 that if the test results were negative we will schedule the Flexible Sigmoidoscopy.

## 2010-12-03 NOTE — Patient Instructions (Signed)
Follow-up visit scheduled for June 21st at 9:45am.  See blue and green discharge instruction sheets.

## 2010-12-06 ENCOUNTER — Telehealth: Payer: Self-pay | Admitting: *Deleted

## 2010-12-06 ENCOUNTER — Encounter: Payer: Self-pay | Admitting: Gastroenterology

## 2010-12-06 MED ORDER — VANCOMYCIN HCL 125 MG PO CAPS: ORAL_CAPSULE | ORAL | Status: DC

## 2010-12-06 MED ORDER — MESALAMINE 1.2 G PO TBEC: DELAYED_RELEASE_TABLET | ORAL | Status: DC

## 2010-12-06 NOTE — Telephone Encounter (Signed)

## 2010-12-06 NOTE — Telephone Encounter (Signed)
Addended by: Harlow Mares D on: 12/06/2010 08:18 AM   Modules accepted: Orders

## 2010-12-06 NOTE — Telephone Encounter (Signed)
Husband aware and rx sent to pharmacy. We will see pt in office visit 12/24/2010

## 2010-12-06 NOTE — Telephone Encounter (Signed)
Per Dr Jarold Motto she needs to stop prednisone and Lialda and start Vancomycin 125 one tablet four times a day for 2 weeks. Then make an office visit for 2 week. I have LMOM

## 2010-12-07 ENCOUNTER — Telehealth: Payer: Self-pay | Admitting: Gastroenterology

## 2010-12-08 ENCOUNTER — Telehealth: Payer: Self-pay | Admitting: Gastroenterology

## 2010-12-08 NOTE — Telephone Encounter (Signed)
rx called to [facility]

## 2010-12-16 ENCOUNTER — Encounter: Payer: Self-pay | Admitting: *Deleted

## 2010-12-24 ENCOUNTER — Other Ambulatory Visit (INDEPENDENT_AMBULATORY_CARE_PROVIDER_SITE_OTHER): Payer: Medicare HMO

## 2010-12-24 ENCOUNTER — Encounter: Payer: Self-pay | Admitting: Gastroenterology

## 2010-12-24 ENCOUNTER — Ambulatory Visit (INDEPENDENT_AMBULATORY_CARE_PROVIDER_SITE_OTHER): Payer: Medicare HMO | Admitting: Gastroenterology

## 2010-12-24 VITALS — BP 124/60 | HR 88 | Ht 66.0 in | Wt 127.0 lb

## 2010-12-24 DIAGNOSIS — K625 Hemorrhage of anus and rectum: Secondary | ICD-10-CM | POA: Insufficient documentation

## 2010-12-24 DIAGNOSIS — Z79899 Other long term (current) drug therapy: Secondary | ICD-10-CM

## 2010-12-24 DIAGNOSIS — R197 Diarrhea, unspecified: Secondary | ICD-10-CM

## 2010-12-24 DIAGNOSIS — E079 Disorder of thyroid, unspecified: Secondary | ICD-10-CM | POA: Insufficient documentation

## 2010-12-24 DIAGNOSIS — Z8679 Personal history of other diseases of the circulatory system: Secondary | ICD-10-CM | POA: Insufficient documentation

## 2010-12-24 DIAGNOSIS — Z86010 Personal history of colonic polyps: Secondary | ICD-10-CM

## 2010-12-24 LAB — CBC WITH DIFFERENTIAL/PLATELET
Basophils Relative: 0.3 % (ref 0.0–3.0)
Eosinophils Absolute: 0.5 10*3/uL (ref 0.0–0.7)
Eosinophils Relative: 5.4 % — ABNORMAL HIGH (ref 0.0–5.0)
Lymphocytes Relative: 18.3 % (ref 12.0–46.0)
MCHC: 34.9 g/dL (ref 30.0–36.0)
Neutrophils Relative %: 67.7 % (ref 43.0–77.0)
RBC: 3.56 Mil/uL — ABNORMAL LOW (ref 3.87–5.11)
WBC: 9.6 10*3/uL (ref 4.5–10.5)

## 2010-12-24 LAB — HEPATIC FUNCTION PANEL
ALT: 21 U/L (ref 0–35)
Albumin: 3.3 g/dL — ABNORMAL LOW (ref 3.5–5.2)
Total Protein: 6.2 g/dL (ref 6.0–8.3)

## 2010-12-24 LAB — IBC PANEL: Saturation Ratios: 43.1 % (ref 20.0–50.0)

## 2010-12-24 LAB — BASIC METABOLIC PANEL
BUN: 6 mg/dL (ref 6–23)
CO2: 31 mEq/L (ref 19–32)
Calcium: 8.5 mg/dL (ref 8.4–10.5)
Creatinine, Ser: 0.7 mg/dL (ref 0.4–1.2)
Glucose, Bld: 77 mg/dL (ref 70–99)

## 2010-12-24 LAB — FOLATE: Folate: >24.8 ng/mL (ref >5.9)

## 2010-12-24 LAB — VITAMIN B12: Vitamin B-12: 457 pg/mL (ref 211–911)

## 2010-12-24 NOTE — Progress Notes (Signed)
This is a 71 year old Caucasian female with recurrent bloody diarrhea. She initially was felt to have C. difficile colitis and has completed a course of metronidazole and vancomycin with probiotic therapy without any improvement. She continues with 5-6 loose bowel movements a day with some crampy lower abdominal pain but no systemic complaints such as fever or chills. Colon biopsies initially were felt to be consistent with C. difficile colitis. Her flexible sigmoidoscopy did show a pretty severe proctosigmoiditis of unexplained etiology. I began reviewing all of her labs and stool exams and there's been no evidence of C. difficile or infectious colitis. She's had multiple colonoscopies in the past because of colon polyps with multiple polypectomies, with her last exam was negative for any recurrent colon polyps or carcinoma. Current Medications, Allergies, Past Medical History, Past Surgical History, Family History and Social History were reviewed in [employer] record.  Pertinent Review of Systems Negative   Physical Exam: Awake alert no acute distress. Exam shows no organomegaly, masses or tenderness. Rectal exam shows bloody stool present without any rectal masses or tenderness. Mental status clear.    Asessment and Plan: This patient has had rather aggressive treatment for possible C. difficile colitis despite negative toxin exams on several stools. Also there've been no evidence of infectious colitis. Actual review her record shows watery diarrhea  a problem for this patient all in all since 1997. He does have atherosclerosis and peripheral vascular disease, but problems do not seem consistent with ischemic colitis. She has had previous carotid endarterectomy. Her past medical history also is remarkable for alcohol abuse, mastectomy for breast cancer, and previously treated hyperthyroidism. Labs drawn today for complete lab review, and started prednisone 30 mg a day for  presumed left-sided ulcerative colitis. I have also advised her that she can use Imodium every 12 hours as needed, and described a low fiber diet as tolerated. Office followup in 2 weeks' time ,and she may need full diagnostic colonoscopy with sedation. Encounter Diagnosis  Name Primary?  . Diarrhea Yes       Encounter Diagnosis  Name Primary?  . Diarrhea Yes

## 2010-12-24 NOTE — Patient Instructions (Signed)
Please go to the basement today for your labs.  Restart your Prednisone 20mg  take 1.5 tablet by mouth once a day in the morning.  Take Imoddium as needed.  Make an appt to come back and see Dr Jarold Motto in 3 weeks.  Follow the low fiber diet given today.

## 2011-01-10 ENCOUNTER — Ambulatory Visit (INDEPENDENT_AMBULATORY_CARE_PROVIDER_SITE_OTHER): Payer: Medicare HMO | Admitting: Gastroenterology

## 2011-01-10 ENCOUNTER — Other Ambulatory Visit: Payer: Medicare HMO

## 2011-01-10 ENCOUNTER — Ambulatory Visit: Payer: Medicare HMO | Admitting: Gastroenterology

## 2011-01-10 ENCOUNTER — Encounter: Payer: Self-pay | Admitting: Gastroenterology

## 2011-01-10 VITALS — BP 126/58 | HR 88 | Ht 66.5 in | Wt 122.4 lb

## 2011-01-10 DIAGNOSIS — R197 Diarrhea, unspecified: Secondary | ICD-10-CM

## 2011-01-10 DIAGNOSIS — K5190 Ulcerative colitis, unspecified, without complications: Secondary | ICD-10-CM | POA: Insufficient documentation

## 2011-01-10 MED ORDER — MESALAMINE 1.2 G PO TBEC: DELAYED_RELEASE_TABLET | ORAL | Status: DC

## 2011-01-10 NOTE — Progress Notes (Signed)
This is a very pleasant 70 year old Caucasian female with recurrent diarrhea for many years. Colonoscopy one month ago showed a severe left-sided colitis. Biopsies suggested C. difficile colitis despite negative stool toxin exam for C. difficile. She was treated with 10 days of metronidazole and probiotics without improvement. Her last clinic visit she was started on prednisone 30 mg a day and is approximately 50% better. Continue the stool frequency but no abdominal or rectal pain, bleeding, or systemic complaints.  Current Medications, Allergies, Past Medical History, Past Surgical History, Family History and Social History were reviewed in [employer] record.  Pertinent Review of Systems Negative   Physical Exam: Chronically ill-appearing Caucasian female appearing older than her stated age. She is in no acute distress. Her abdomen shows no significant distention, organomegaly, masses or tenderness. Rectal exam shows no masses or tenderness with good tone squeeze pressure. There is liquidy stool which is trace quite positive. Mental status is normal.    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 Our repeat her stool exam for C. difficile, also routine culture and sensitivity. We will continue prednisone 30 mg a day and I have added Lialda 2.4 g a day while continuing a low fiber diet. She is to see me again in 2 weeks' time for followup. It is noted that the patient also has taken Imodium 3 times a day. Lab review is otherwise unremarkable. CRP was elevated to 9.9. Also serum albumin was decreased at 3.3 g percent, but otherwise normal liver function tests and anemia profile.   Please copy her primary care physician, referring physician, and pertinent subspecialists. Encounter Diagnosis  Name Primary?  . Diarrhea Yes

## 2011-01-10 NOTE — Patient Instructions (Signed)
Take the Lialda two tablets by mouth once a day, samples given.  Please go to the basement today for your labs.  Please make an office visit to come back and see Dr Jarold Motto in 2 weeks.  Continue all other medications.

## 2011-01-12 ENCOUNTER — Telehealth: Payer: Self-pay | Admitting: *Deleted

## 2011-01-12 NOTE — Telephone Encounter (Signed)
Pt tested pos for d diff needs to stop aupp and prednisone. She needs vanco 250mg  qid for 2 weeks, 250mg  bid for 2 weeks, 250mg  qd for 2 weeks and ov in 6 weeks.

## 2011-01-13 MED ORDER — VANCOMYCIN HCL 250 MG PO CAPS: ORAL_CAPSULE | ORAL | Status: DC

## 2011-01-13 NOTE — Telephone Encounter (Signed)
Pt aware she will have her husband call back to reschedule her office visit for around 02/21/2011

## 2011-01-14 ENCOUNTER — Telehealth: Payer: Self-pay | Admitting: Gastroenterology

## 2011-01-14 ENCOUNTER — Encounter: Payer: Self-pay | Admitting: *Deleted

## 2011-01-14 LAB — STOOL CULTURE

## 2011-01-14 NOTE — Telephone Encounter (Signed)
Pt aware rx is to be filled it was my error i faxed dc order to wrong pharmacy. I have called [employer] and waiting on prior auth.

## 2011-01-17 ENCOUNTER — Telehealth: Payer: Self-pay | Admitting: Gastroenterology

## 2011-01-17 NOTE — Telephone Encounter (Signed)
Called [employer] and they state that they have resolved the issue I have faxed the approval to [facility],

## 2011-01-20 ENCOUNTER — Other Ambulatory Visit: Payer: Self-pay | Admitting: *Deleted

## 2011-01-20 NOTE — Telephone Encounter (Signed)
Faxed request for refill on phenobarb.  Pt last seen on 11/26/10, no follow up scheduled.  Is this OK to refill.  Please advise.

## 2011-01-21 MED ORDER — PHENOBARBITAL 97.2 MG PO TABS: 97.2000 mg | ORAL_TABLET | Freq: Two times a day (BID) | ORAL | Status: DC

## 2011-01-21 NOTE — Telephone Encounter (Signed)
Ok for refills prn  

## 2011-01-21 NOTE — Telephone Encounter (Signed)
RX called to [facility], Pharmacist @ [facility] for #[phone].

## 2011-02-07 ENCOUNTER — Inpatient Hospital Stay (HOSPITAL_COMMUNITY)
Admission: AD | Admit: 2011-02-07 | Discharge: 2011-02-19 | DRG: 386 | Disposition: A | Discharge status: Home / Self Care | Payer: Medicare HMO | Source: Ambulatory Visit | Attending: Internal Medicine | Admitting: Internal Medicine

## 2011-02-07 ENCOUNTER — Telehealth: Payer: Self-pay | Admitting: Gastroenterology

## 2011-02-07 DIAGNOSIS — E785 Hyperlipidemia, unspecified: Secondary | ICD-10-CM | POA: Diagnosis present

## 2011-02-07 DIAGNOSIS — Z923 Personal history of irradiation: Secondary | ICD-10-CM

## 2011-02-07 DIAGNOSIS — G40909 Epilepsy, unspecified, not intractable, without status epilepticus: Secondary | ICD-10-CM | POA: Diagnosis present

## 2011-02-07 DIAGNOSIS — R197 Diarrhea, unspecified: Secondary | ICD-10-CM

## 2011-02-07 DIAGNOSIS — A0472 Enterocolitis due to Clostridium difficile, not specified as recurrent: Secondary | ICD-10-CM

## 2011-02-07 DIAGNOSIS — IMO0002 Reserved for concepts with insufficient information to code with codable children: Secondary | ICD-10-CM

## 2011-02-07 DIAGNOSIS — I4891 Unspecified atrial fibrillation: Secondary | ICD-10-CM | POA: Diagnosis present

## 2011-02-07 DIAGNOSIS — J309 Allergic rhinitis, unspecified: Secondary | ICD-10-CM | POA: Diagnosis present

## 2011-02-07 DIAGNOSIS — Z79899 Other long term (current) drug therapy: Secondary | ICD-10-CM

## 2011-02-07 DIAGNOSIS — E039 Hypothyroidism, unspecified: Secondary | ICD-10-CM | POA: Diagnosis present

## 2011-02-07 DIAGNOSIS — Z9221 Personal history of antineoplastic chemotherapy: Secondary | ICD-10-CM

## 2011-02-07 DIAGNOSIS — Z9010 Acquired absence of unspecified breast and nipple: Secondary | ICD-10-CM

## 2011-02-07 DIAGNOSIS — R627 Adult failure to thrive: Secondary | ICD-10-CM | POA: Diagnosis present

## 2011-02-07 DIAGNOSIS — K5130 Ulcerative (chronic) rectosigmoiditis without complications: Principal | ICD-10-CM | POA: Diagnosis present

## 2011-02-07 DIAGNOSIS — D649 Anemia, unspecified: Secondary | ICD-10-CM | POA: Diagnosis present

## 2011-02-07 DIAGNOSIS — N390 Urinary tract infection, site not specified: Secondary | ICD-10-CM | POA: Diagnosis present

## 2011-02-07 DIAGNOSIS — E876 Hypokalemia: Secondary | ICD-10-CM | POA: Diagnosis not present

## 2011-02-07 DIAGNOSIS — Z853 Personal history of malignant neoplasm of breast: Secondary | ICD-10-CM

## 2011-02-07 DIAGNOSIS — F03918 Unspecified dementia, unspecified severity, with other behavioral disturbance: Secondary | ICD-10-CM | POA: Diagnosis present

## 2011-02-07 DIAGNOSIS — F0391 Unspecified dementia with behavioral disturbance: Secondary | ICD-10-CM | POA: Diagnosis present

## 2011-02-07 DIAGNOSIS — I10 Essential (primary) hypertension: Secondary | ICD-10-CM | POA: Diagnosis present

## 2011-02-07 DIAGNOSIS — E46 Unspecified protein-calorie malnutrition: Secondary | ICD-10-CM | POA: Diagnosis present

## 2011-02-07 DIAGNOSIS — Z85819 Personal history of malignant neoplasm of unspecified site of lip, oral cavity, and pharynx: Secondary | ICD-10-CM

## 2011-02-07 DIAGNOSIS — F05 Delirium due to known physiological condition: Secondary | ICD-10-CM | POA: Diagnosis present

## 2011-02-07 DIAGNOSIS — R5381 Other malaise: Secondary | ICD-10-CM | POA: Diagnosis present

## 2011-02-07 DIAGNOSIS — E871 Hypo-osmolality and hyponatremia: Secondary | ICD-10-CM | POA: Diagnosis present

## 2011-02-07 DIAGNOSIS — R269 Unspecified abnormalities of gait and mobility: Secondary | ICD-10-CM | POA: Diagnosis present

## 2011-02-07 DIAGNOSIS — Z860100 Personal history of colon polyps, unspecified: Secondary | ICD-10-CM

## 2011-02-07 DIAGNOSIS — E119 Type 2 diabetes mellitus without complications: Secondary | ICD-10-CM | POA: Diagnosis present

## 2011-02-07 DIAGNOSIS — R1310 Dysphagia, unspecified: Secondary | ICD-10-CM | POA: Diagnosis present

## 2011-02-07 DIAGNOSIS — Z7982 Long term (current) use of aspirin: Secondary | ICD-10-CM

## 2011-02-07 DIAGNOSIS — B961 Klebsiella pneumoniae [K. pneumoniae] as the cause of diseases classified elsewhere: Secondary | ICD-10-CM | POA: Diagnosis present

## 2011-02-07 LAB — COMPREHENSIVE METABOLIC PANEL
Albumin: 2.4 g/dL — ABNORMAL LOW (ref 3.5–5.2)
BUN: 4 mg/dL — ABNORMAL LOW (ref 6–23)
Calcium: 8.1 mg/dL — ABNORMAL LOW (ref 8.4–10.5)
Creatinine, Ser: 0.59 mg/dL (ref 0.50–1.10)
Total Bilirubin: 0.2 mg/dL — ABNORMAL LOW (ref 0.3–1.2)
Total Protein: 5.8 g/dL — ABNORMAL LOW (ref 6.0–8.3)

## 2011-02-07 LAB — DIFFERENTIAL
Basophils Absolute: 0 10*3/uL (ref 0.0–0.1)
Lymphocytes Relative: 17 % (ref 12–46)
Lymphs Abs: 2.5 10*3/uL (ref 0.7–4.0)
Neutro Abs: 11 10*3/uL — ABNORMAL HIGH (ref 1.7–7.7)
Neutrophils Relative %: 75 % (ref 43–77)

## 2011-02-07 LAB — CBC
HCT: 33.6 % — ABNORMAL LOW (ref 36.0–46.0)
MCV: 93.1 fL (ref 78.0–100.0)
RBC: 3.61 MIL/uL — ABNORMAL LOW (ref 3.87–5.11)
WBC: 14.7 10*3/uL — ABNORMAL HIGH (ref 4.0–10.5)

## 2011-02-07 NOTE — Telephone Encounter (Signed)
Per Dr Jarold Motto patient is to be admitted I have called patient and advised her husband that she has a bed at [facility]. I have paged [name] to advise her that patient is coming and will be admitted under GI. Sarah aware she will see pt.

## 2011-02-07 NOTE — Telephone Encounter (Signed)
patient states that she is passing a great deal of blood. And the diarrhea is worse she is still taking vanco 2 tsp per day. Her husband states that she is getting no better and even has a lot of fecal incontinence. Would like to know what to do??

## 2011-02-08 ENCOUNTER — Other Ambulatory Visit: Payer: Self-pay | Admitting: Internal Medicine

## 2011-02-08 LAB — FECAL LACTOFERRIN, QUANT

## 2011-02-08 LAB — PREALBUMIN: Prealbumin: 8.6 mg/dL — ABNORMAL LOW (ref 17.0–34.0)

## 2011-02-08 LAB — GIARDIA/CRYPTOSPORIDIUM SCREEN(EIA): Cryptosporidium Screen (EIA): NEGATIVE

## 2011-02-08 LAB — CBC
HCT: 33.3 % — ABNORMAL LOW (ref 36.0–46.0)
Hemoglobin: 11.4 g/dL — ABNORMAL LOW (ref 12.0–15.0)
MCH: 32.2 pg (ref 26.0–34.0)
MCHC: 34.2 g/dL (ref 30.0–36.0)
RDW: 14.9 % (ref 11.5–15.5)

## 2011-02-08 LAB — CLOSTRIDIUM DIFFICILE BY PCR
Toxigenic C. Difficile by PCR: NEGATIVE
Toxigenic C. Difficile by PCR: NEGATIVE

## 2011-02-09 DIAGNOSIS — K5190 Ulcerative colitis, unspecified, without complications: Secondary | ICD-10-CM

## 2011-02-09 DIAGNOSIS — E876 Hypokalemia: Secondary | ICD-10-CM

## 2011-02-09 LAB — BASIC METABOLIC PANEL
Calcium: 7.4 mg/dL — ABNORMAL LOW (ref 8.4–10.5)
Calcium: 7.5 mg/dL — ABNORMAL LOW (ref 8.4–10.5)
GFR calc Af Amer: >60 mL/min (ref >60)
GFR calc Af Amer: >60 mL/min (ref >60)
GFR calc non Af Amer: >60 mL/min (ref >60)
GFR calc non Af Amer: >60 mL/min (ref >60)
Glucose, Bld: 89 mg/dL (ref 70–99)
Potassium: 2.6 mEq/L — CL (ref 3.5–5.1)
Potassium: 3.2 mEq/L — ABNORMAL LOW (ref 3.5–5.1)
Sodium: 139 mEq/L (ref 135–145)
Sodium: 138 mEq/L (ref 135–145)

## 2011-02-09 LAB — SEDIMENTATION RATE: Sed Rate: 12 mm/hr (ref 0–22)

## 2011-02-10 DIAGNOSIS — F329 Major depressive disorder, single episode, unspecified: Secondary | ICD-10-CM

## 2011-02-10 LAB — BASIC METABOLIC PANEL
BUN: 4 mg/dL — ABNORMAL LOW (ref 6–23)
Chloride: 104 mEq/L (ref 96–112)
Potassium: 3.3 mEq/L — ABNORMAL LOW (ref 3.5–5.1)
Sodium: 138 mEq/L (ref 135–145)

## 2011-02-11 DIAGNOSIS — N3000 Acute cystitis without hematuria: Secondary | ICD-10-CM

## 2011-02-11 LAB — STOOL CULTURE

## 2011-02-11 LAB — BASIC METABOLIC PANEL
BUN: 3 mg/dL — ABNORMAL LOW (ref 6–23)
Calcium: 8.3 mg/dL — ABNORMAL LOW (ref 8.4–10.5)
Chloride: 100 mEq/L (ref 96–112)
Creatinine, Ser: <0.47 mg/dL — ABNORMAL LOW (ref 0.50–1.10)

## 2011-02-12 LAB — BASIC METABOLIC PANEL
CO2: 29 mEq/L (ref 19–32)
Chloride: 96 mEq/L (ref 96–112)
Sodium: 131 mEq/L — ABNORMAL LOW (ref 135–145)

## 2011-02-12 LAB — URINE CULTURE: Colony Count: >=100000

## 2011-02-13 DIAGNOSIS — N3000 Acute cystitis without hematuria: Secondary | ICD-10-CM

## 2011-02-13 DIAGNOSIS — K5190 Ulcerative colitis, unspecified, without complications: Secondary | ICD-10-CM

## 2011-02-14 ENCOUNTER — Telehealth: Payer: Self-pay | Admitting: *Deleted

## 2011-02-14 DIAGNOSIS — K5190 Ulcerative colitis, unspecified, without complications: Secondary | ICD-10-CM

## 2011-02-14 DIAGNOSIS — K529 Noninfective gastroenteritis and colitis, unspecified: Secondary | ICD-10-CM

## 2011-02-14 LAB — CBC
MCH: 32.1 pg (ref 26.0–34.0)
MCV: 95.3 fL (ref 78.0–100.0)
Platelets: 290 10*3/uL (ref 150–400)
RBC: 3.65 MIL/uL — ABNORMAL LOW (ref 3.87–5.11)

## 2011-02-14 LAB — GLUCOSE, CAPILLARY
Glucose-Capillary: 157 mg/dL — ABNORMAL HIGH (ref 70–99)
Glucose-Capillary: 175 mg/dL — ABNORMAL HIGH (ref 70–99)
Glucose-Capillary: 129 mg/dL — ABNORMAL HIGH (ref 70–99)
Glucose-Capillary: 258 mg/dL — ABNORMAL HIGH (ref 70–99)
Glucose-Capillary: 126 mg/dL — ABNORMAL HIGH (ref 70–99)
Glucose-Capillary: 278 mg/dL — ABNORMAL HIGH (ref 70–99)
Glucose-Capillary: 136 mg/dL — ABNORMAL HIGH (ref 70–99)
Glucose-Capillary: 129 mg/dL — ABNORMAL HIGH (ref 70–99)

## 2011-02-14 LAB — URINALYSIS, ROUTINE W REFLEX MICROSCOPIC
Bilirubin Urine: NEGATIVE
Nitrite: NEGATIVE
Specific Gravity, Urine: 1.008 (ref 1.005–1.030)
Urobilinogen, UA: 0.2 mg/dL (ref 0.0–1.0)
pH: 7 (ref 5.0–8.0)

## 2011-02-14 LAB — BASIC METABOLIC PANEL
BUN: 8 mg/dL (ref 6–23)
CO2: 29 mEq/L (ref 19–32)
Calcium: 9 mg/dL (ref 8.4–10.5)
Creatinine, Ser: 0.63 mg/dL (ref 0.50–1.10)

## 2011-02-14 LAB — URINE MICROSCOPIC-ADD ON

## 2011-02-14 NOTE — H&P (Signed)
[name], [name]            ACCOUNT NO.:  [id number]  MEDICAL RECORD NO.:  [id number]  LOCATION:  5020                         FACILITY:  [facility]  PHYSICIAN:  [name] Blinks, MD         DATE OF BIRTH:  09/21/1940  DATE OF ADMISSION:  02/07/2011 DATE OF DISCHARGE:                             HISTORY & PHYSICAL   REASON FOR THE ADMISSION:  Diarrhea with bloody bowel movements.  PRIMARY GASTROENTEROLOGY PHYSICIAN:  [name] Rea. Jarold Motto, MD, Clementeen Graham, FACP, FAGA.  PRIMARY CARE PHYSICIAN:  [name] Gess. Norins, MD  HISTORY OF PRESENT ILLNESS:  Ms.  [name] is a pleasant 70 year old white female whose diarrhea troubles began in March.  During a hospitalization for a pelvic rami hip fracture, she was treated for  UTI with ciprofloxacin.  Shortly after she developed diarrhea, initial treatment was Flagyl and probiotics and then vancomycin for presumed C. Diff; however, initial PCR test for C. diff were negative.  She had ongoing problems with not only the diarrhea but also bloody diarrhea.  She underwent a flexible sigmoidoscopy on December 03, 2010, which showed severe proctosigmoiditis.  Biopsies showed active colitis with features strongly favoring infectious colitis and not favoring inflammatory bowel disease.  On December 24, 2010, [name] prescribed 30 mg of prednisone and use of Imodium for possible inflammatory bowel disease.  On January 10, 2011, her symptoms were persistent, and he added Lialda 2.4 g a day.  On the January 10, 2011, or so, a C. diff toxin test was positive.  This is the first positive C. Diff  she had had.  At that point, Lialda and prednisone were discontinued and vancomycin was added.  The initial dose was 250 mg 4 times daily for 2 weeks and thereafter a tapering dosing pattern.  She continues to have bloody stools, some lower abdominal cramping.  The stools are watery.  She is having about five or so bowel movements a day and has fecal incontinence.  She has some nausea,  which is not severe.  She has lost at least 10 pounds in the last 6 weeks. This morning in a rush to get to the toilet she fell over.  She did not have a syncopal spell or sustained any painful injuries.  She called [name] who arranged for a direct admission to the hospital for management of refractory diarrhea, at present it is unclear whether this is inflammatory bowel disease for C. diff or other infectious etiology to the diarrhea.  PAST MEDICAL HISTORY: 1. Hypertension. 2. Hypothyroidism. 3. Right inferior pubic rami fracture in mid March 2012, which was     managed medically.  She did not require surgery. 4. Rate-controlled atrial fibrillation in March 2012.  The patient was     not started on Coumadin, but was to continue taking daily aspirin. 5. Hyperlipidemia. 6. E. coli urinary tract infection in March 2012. 7. History of subdural hematoma requiring craniotomy and hematoma     evacuation in 1992. 8. Seizure disorder. 9. Bilateral breast cancer.  She underwent right mastectomy and     chemotherapy in 1999.  She underwent lumpectomy and radiation     therapy on the left in 2007.  She has not had any breast     enhancement or reconstruction. 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 11.Status post bilateral cataract extractions and lens implantation. 12.Status post left carotid endarterectomy in 2000. 13.History of adenomatous colon polyps with most recent May 2011     colonoscopy negative for recurrent polyps.  CURRENT MEDICATIONS: 1. Vancomycin 250 mg.  The current tapered dose is twice daily through     February 10, 2011. 2. Calcium carbonate 600 mg daily. 3. Cardizem CD 180 mg p.o. in the evening. 4. Folic acid 1 mg p.o. daily. 5. Magnesium oxide 400 mg p.o. daily. 6. Levothyroxine 50 mcg daily. 7. Phenobarbital 97.2 mg p.o. twice daily. 8. Dilantin 100 mg extended release 3 p.o. in  the evening. 9. Crestor 20 mg p.o. at bedtime. 10.Vitamin C 500 mg daily. 11.Fosamax 70 mg weekly.  She takes this on Tuesdays.  REVIEW OF SYSTEMS:  CONSTITUTIONAL:  The patient is somewhat weak, but has not had any syncope.  Her weight is down about 10 pounds over 6 weeks.  EYESIGHT:  She wears reading glasses.  ENT:  No hearing aids. No epistaxis.  CARDIOVASCULAR:  No palpitations.  No angina.  The patient does have lower extremity edema, which is worse when she has been up all day and better after a good night sleep and prolonged supine positioning.  RESPIRATORY:  Denies shortness of breath and cough.  GI: Please see above.  Nausea is mild, and she has only vomited 1 time in the last few weeks.  She does have decreased p.o. intake. GENITOURINARY:  No dysuria.  No hematuria.  MUSCULOSKELETAL:  Denies significant arthralgias or muscle soreness.  DERMATOLOGIC:  No rash.  No itching.  NEUROLOGIC:  She does tend to get dizzy with positional changes and needs to slowly get up from a sitting to a standing position.  The patient fell this morning because she was a bit dizzy and also was a little unsteady on her feet.  She did not lose consciousness. The patient says she has been having some headaches in the front of her forehead area when she is getting ready to and having bowel movements, but they resolve quickly.  PSYCHOLOGICAL:  No depression. Sleep has been interrupted secondary to having to get up to move her bowels during the night time.  ENDOCRINE:  No history of diabetes mellitus.  No history of glucose intolerance.  HEMATOLOGIC:  She recalls having had a blood transfusion in 1992 or thereabouts.  ALLERGIES AND IMMUNIZATION STATUS:  She has had pneumococcal vaccine in the past, and she had a 2011 flu shot.  PRIOR ENDOSCOPY AND COLONOSCOPY:  In May 2011, she had the colonoscopy. I do not have the report, but she did not have recurrence of any polyps according to Dr. Norval [name]  subsequent notes, and she had the flexible sigmoidoscopy as described above on December 03, 2010.  LABORATORY DATA:  Sodium 134, potassium 3.8, chloride 98, CO2 31, glucose 127, BUN 4, creatinine 0.5, total bilirubin 0.2, alkaline phosphatase 130, AST 35, ALT 20, albumin 3.4, calcium 8.1, total protein 5.8, prealbumin 8.6, TSH level 0.595.  Hemoglobin 11.6, hematocrit 33.6, MCV 93.1, white blood cell count 14.7, platelet count 234,000.  Stool studies are pending for C. diff, stool cultures, Giardia Cryptosporidium O&P, fecal lactoferrin.  X-RAY IMAGING STUDIES:  None pending and none performed.  PHYSICAL EXAMINATION:  VITAL SIGNS:  Temperature 97.5,  pulse 94, respirations 18, saturation on room air 93%, blood pressure 112/69.  No weight yet obtained. GENERAL:  The patient is a pale, chronically ill, and somewhat frail- appearing elderly white female who is in good spirits and retains a sense of humor. HEENT:  There is no conjunctival pallor, extraocular movements are intact.  There is no icterus.  The lower jaw and neck area are somewhat asymmetric owing to prior surgery.  The oral mucosa is slightly dry.  No sores in the mouth or oropharynx and no exudates.  She has  full dentures on the bottom, and a partial denture on the top. NECK:  There is no JVD, no masses, no goiter, and no bruits. LUNGS:  Clear to auscultation and percussion bilaterally.  She has post mastectomy appearance of the right breast area.  CARDIOVASCULAR: Regular rate and rhythm with an occasional extrasystole. ABDOMEN:  Soft with mild tenderness bilaterally in the lower quadrants. No guarding or rebound.  Bowel sounds are active.  Belly is nondistended.  There is a level 1-2 bruits bilaterally at the mid abdomen.  No pulsatile mass. EXTREMITIES:  There is 2 to 3+ pitting edema at the ankle and feet. RECTAL:  No mass lesions palpable.  There is a small amount of blood on the exam glove. NEUROLOGIC:  The patient is  alert and oriented x3.  She walks slowly, but unassisted.  There is no tremor.  Her speech is somewhat slurred, which is chronic for her because of her oral surgery.  IMPRESSION: 1. Chronic diarrhea with bloody stools. Question is this refractory     Clostridium difficile or some other infectious colitis versus     inflammatory bowel disease versus atypical presentation of ischemic     colitis. 2. History of seizures.  She has had no seizure activity recently.     She is on stable doses of phenobarbital and Dilantin. 3. History of rate-controlled atrial fibrillation stable on     diltiazem. 4. Abdominal bruit. Renal function is within normal limits. There is no      out of control hypertension associated with this bruit. 6. Protein malnutrition.  Suspect lower extremity edema is secondary     to this. 7. History of oral cancers and breast cancers.  PLAN: 1. The patient will prep and have colonoscopy on February 08, 2011.  We     will follow CBC given the chronic hematochezia/bloody stools. 2. IV fluid hydration, diet for now will be clear liquids. 3. Continue antiseizure medications along with Cardizem and     levothyroxine.     [name], PA-C   ______________________________ [name] Blinks, MD    SG/MEDQ  D:  02/08/2011  T:  02/08/2011  Job:  [id number]  Electronically Signed by [name] PA-C on 02/09/2011 03:22:41 PM Electronically Signed by [name] Blinks MD on 02/14/2011 10:47:08 AM

## 2011-02-14 NOTE — Telephone Encounter (Signed)
Per [name], PA, ordered CBC and BMET prior to pt's f/u appt on 03/01/11 with Dr Jarold Motto.

## 2011-02-16 DIAGNOSIS — F329 Major depressive disorder, single episode, unspecified: Secondary | ICD-10-CM

## 2011-02-16 LAB — BASIC METABOLIC PANEL
BUN: 6 mg/dL (ref 6–23)
Creatinine, Ser: 0.54 mg/dL (ref 0.50–1.10)
GFR calc Af Amer: >60 mL/min (ref >60)
GFR calc non Af Amer: >60 mL/min (ref >60)
Glucose, Bld: 102 mg/dL — ABNORMAL HIGH (ref 70–99)

## 2011-02-16 LAB — GLUCOSE, CAPILLARY
Glucose-Capillary: 151 mg/dL — ABNORMAL HIGH (ref 70–99)
Glucose-Capillary: 112 mg/dL — ABNORMAL HIGH (ref 70–99)
Glucose-Capillary: 119 mg/dL — ABNORMAL HIGH (ref 70–99)

## 2011-02-16 LAB — CBC
HCT: 31.7 % — ABNORMAL LOW (ref 36.0–46.0)
MCHC: 34.4 g/dL (ref 30.0–36.0)
MCV: 93.5 fL (ref 78.0–100.0)
RDW: 14.1 % (ref 11.5–15.5)

## 2011-02-16 LAB — HEMOGLOBIN A1C: Mean Plasma Glucose: 128 mg/dL — ABNORMAL HIGH (ref <117)

## 2011-02-16 LAB — MISCELLANEOUS TEST

## 2011-02-17 LAB — DIFFERENTIAL
Basophils Absolute: 0 10*3/uL (ref 0.0–0.1)
Basophils Relative: 0 % (ref 0–1)
Eosinophils Relative: 0 % (ref 0–5)
Lymphocytes Relative: 23 % (ref 12–46)
Neutro Abs: 9.8 10*3/uL — ABNORMAL HIGH (ref 1.7–7.7)

## 2011-02-17 LAB — CBC
HCT: 34.2 % — ABNORMAL LOW (ref 36.0–46.0)
MCHC: 33.9 g/dL (ref 30.0–36.0)
RDW: 14 % (ref 11.5–15.5)
WBC: 14.2 10*3/uL — ABNORMAL HIGH (ref 4.0–10.5)

## 2011-02-17 LAB — GLUCOSE, CAPILLARY: Glucose-Capillary: 139 mg/dL — ABNORMAL HIGH (ref 70–99)

## 2011-02-18 ENCOUNTER — Inpatient Hospital Stay (HOSPITAL_COMMUNITY): Payer: Medicare HMO

## 2011-02-18 DIAGNOSIS — K5190 Ulcerative colitis, unspecified, without complications: Secondary | ICD-10-CM

## 2011-02-18 DIAGNOSIS — F329 Major depressive disorder, single episode, unspecified: Secondary | ICD-10-CM

## 2011-02-18 LAB — GLUCOSE, CAPILLARY
Glucose-Capillary: 112 mg/dL — ABNORMAL HIGH (ref 70–99)
Glucose-Capillary: 147 mg/dL — ABNORMAL HIGH (ref 70–99)

## 2011-02-21 ENCOUNTER — Telehealth: Payer: Self-pay | Admitting: Gastroenterology

## 2011-02-21 LAB — GLUCOSE, CAPILLARY

## 2011-02-21 MED ORDER — ESOMEPRAZOLE MAGNESIUM 40 MG PO CPDR: DELAYED_RELEASE_CAPSULE | ORAL | Status: DC

## 2011-02-21 MED ORDER — MESALAMINE 400 MG PO TBEC: DELAYED_RELEASE_TABLET | ORAL | Status: DC

## 2011-02-21 NOTE — Telephone Encounter (Signed)
Patients husband says that pt was placed Asacol 400mg  4 capsules by mouth tid, but they have Lilada at home and wants to know if she should take Lialda or asacol and at what dose does she take these drugs. She is also having epigastric pain and increased reflux since starting these new meds, wonders if she should be on a PPI. She is also on a low fiber diet wants to know if she should continue that. She is also on Prednisone 40mg  a day. She has a follow up on 03/01/2011

## 2011-02-21 NOTE — Telephone Encounter (Signed)
asacol and nexium

## 2011-02-21 NOTE — Telephone Encounter (Signed)
Notified pt's husband that Dr Jarold Motto would like pt to be on Asacol 400mg , 4 capsules 3 times daily as well as Nexium 40 mg daily. I ordered from a local pharmacy and pt can discuss long term ordering on OV 03/01/11. Husband stated understanding.

## 2011-02-22 ENCOUNTER — Ambulatory Visit: Payer: Medicare HMO | Admitting: Gastroenterology

## 2011-02-22 ENCOUNTER — Encounter: Payer: Self-pay | Admitting: Internal Medicine

## 2011-03-01 ENCOUNTER — Ambulatory Visit (INDEPENDENT_AMBULATORY_CARE_PROVIDER_SITE_OTHER): Payer: Medicare HMO | Admitting: Gastroenterology

## 2011-03-01 ENCOUNTER — Encounter: Payer: Self-pay | Admitting: Gastroenterology

## 2011-03-01 VITALS — BP 136/62 | HR 64 | Ht 66.0 in | Wt 126.8 lb

## 2011-03-01 DIAGNOSIS — R197 Diarrhea, unspecified: Secondary | ICD-10-CM

## 2011-03-01 DIAGNOSIS — R609 Edema, unspecified: Secondary | ICD-10-CM | POA: Insufficient documentation

## 2011-03-01 DIAGNOSIS — K529 Noninfective gastroenteritis and colitis, unspecified: Secondary | ICD-10-CM

## 2011-03-01 DIAGNOSIS — K5190 Ulcerative colitis, unspecified, without complications: Secondary | ICD-10-CM

## 2011-03-01 MED ORDER — HYDROCHLOROTHIAZIDE 25 MG PO TABS: 25.0000 mg | ORAL_TABLET | Freq: Every day | ORAL | Status: DC

## 2011-03-01 MED ORDER — PREDNISONE 20 MG PO TABS: ORAL_TABLET | ORAL | Status: DC

## 2011-03-01 NOTE — Progress Notes (Signed)
This is a very pleasant 70 year old Caucasian female recently hospitalized for flare of her ulcerative colitis after she was treated for C. difficile colitis. Repeat colonoscopy was performed by Dr. [name] affirming inflammatory bowel disease, and she was started on prednisone 40 mg a day along with high dose by mouth aminosalicylate therapy. She currently is asymptomatic in terms of her gastrointestinal issues and is down to 2 semi-formed bowel movements a day without blood or abdominal pain. She has had some peripheral edema but no other steroid side effects. Her appetite is good and she was to liberalize her diet. She denies systemic complaints, psychiatric or neurological problems. She does have chronic GERD and has recently been placed on Nexium 40 mg a day with good response. Patient also is on aspirin 81 mg a day and thyroid replacement, and antihypertensive medications.  Current Medications, Allergies, Past Medical History, Past Surgical History, Family History and Social History were reviewed in [employer] record.  Pertinent Review of Systems Negative Past Medical History  Diagnosis Date  . Peripheral vascular disease   . Subdural hematoma   . Ataxia   . Mild dysplasia of cervix   . UTI (lower urinary tract infection)   . Urinary incontinence   . Seizures   . Cerebrovascular disease   . Hypothyroidism   . Personal history of colonic polyps     adenomatous 1997 & tubular adenoma and hyplastic  2008  . Hyperlipidemia   . Adenocarcinoma, breast     bilateral  . H/O alcohol abuse   . Diverticulosis of colon (without mention of hemorrhage)   . Redundant colon   . Squamous cell carcinoma of mouth   . Stroke   . C. difficile colitis    Past Surgical History  Procedure Date  . Subdural hematoma evacuation via craniotomy   . 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 Multiple tooth extractions     due to oral cancer  . Cataract extraction,  bilateral   . Breast lumpectomy     left breast with radiation therapy  . Carotid endarterectomy     left  . Mastectomy     Right, history with nodule dissection    reports that she has quit smoking. She has never used smokeless tobacco. She reports that she drinks about 4.2 ounces of alcohol per week. She reports that she does not use illicit drugs. family history includes Cancer in an unspecified family member; Coronary artery disease in her mother; Diabetes in her sister; and Heart disease in her mother. No Known Allergies     Physical Exam: Early moon facies noted but no thyromegaly or lymphadenopathy. She does have +1 edema of the lower [place] he is without evidence of phlebitis or cellulitis. Her chest is clear cardiac exam shows regular rhythm without murmurs gallops or rubs. There is no abdominal distention, organomegaly, masses or tenderness. Bowel sounds are normal. Rectal exam is deferred. Her mental status is entirely normal without gross neurological deficits. Blood pressure today is normal at 136/62.    Assessment and Plan: Ulcerative colitis in remission on high-dose corticosteroid and high-dose aminosalicylate therapy. I have decreased her prednisone to 30 mg a day for 2 weeks, then 20 mg a day for 2 weeks, decreased her Asacol to 1.2 g 3 times a day, had prescribed HCTZ 25 mg a day her edema with by mouth potassium foods in her diet, will also liberalize her diet to a regular diet as tolerated  and continue other medications as reviewed including Nexium 40 mg a day. Have reviewed side effects of steroids, have asked her to continue daily calcium with vitamin D supplements. See her in one month's time or when necessary as needed   .Please copy her primary care physician, referring physician, and pertinent subspecialists. Encounter Diagnoses  Name Primary?  . UC (ulcerative colitis) Yes  . Chronic diarrhea   . Edema

## 2011-03-01 NOTE — Patient Instructions (Addendum)
Decrease your Prednisone to 30mg  every morning for 2 weeks, then Prednisone 20mg  every morning for 2 weeks a new rx has been sent to your pharmacy.  Start HCTZ 25mg  one tablet by mouth once a day for edema, we have sent a rx to your pharmacy.  Finish Asacol and then start Lialda 1.2g 2 tablets by mouth every morning.  Make a follow up office visit to come back and see Dr Jarold Motto in 1 month.

## 2011-03-04 ENCOUNTER — Ambulatory Visit: Payer: Medicare HMO | Admitting: Internal Medicine

## 2011-03-04 ENCOUNTER — Ambulatory Visit (INDEPENDENT_AMBULATORY_CARE_PROVIDER_SITE_OTHER): Payer: Medicare HMO | Admitting: Internal Medicine

## 2011-03-04 DIAGNOSIS — Z87448 Personal history of other diseases of urinary system: Secondary | ICD-10-CM

## 2011-03-04 DIAGNOSIS — K5190 Ulcerative colitis, unspecified, without complications: Secondary | ICD-10-CM

## 2011-03-04 DIAGNOSIS — R1310 Dysphagia, unspecified: Secondary | ICD-10-CM

## 2011-03-04 DIAGNOSIS — S32599A Other specified fracture of unspecified pubis, initial encounter for closed fracture: Secondary | ICD-10-CM

## 2011-03-04 DIAGNOSIS — R739 Hyperglycemia, unspecified: Secondary | ICD-10-CM

## 2011-03-04 DIAGNOSIS — I10 Essential (primary) hypertension: Secondary | ICD-10-CM

## 2011-03-04 DIAGNOSIS — S32509A Unspecified fracture of unspecified pubis, initial encounter for closed fracture: Secondary | ICD-10-CM

## 2011-03-04 DIAGNOSIS — R7309 Other abnormal glucose: Secondary | ICD-10-CM

## 2011-03-04 DIAGNOSIS — E785 Hyperlipidemia, unspecified: Secondary | ICD-10-CM

## 2011-03-04 MED ORDER — PRAVASTATIN SODIUM 40 MG PO TABS: 40.0000 mg | ORAL_TABLET | Freq: Every evening | ORAL | Status: DC

## 2011-03-05 HISTORY — PX: ORIF HIP FRACTURE: SHX2125

## 2011-03-07 DIAGNOSIS — R1310 Dysphagia, unspecified: Secondary | ICD-10-CM | POA: Insufficient documentation

## 2011-03-07 NOTE — Assessment & Plan Note (Signed)
Patient with a recent flare of IBD. She has just been started on oral prednisone and is having some fluid accumulation, i.e. Moon facies. Duration of therapy per Dr. Jarold Motto

## 2011-03-07 NOTE — Progress Notes (Signed)
  Subjective:    Patient ID: [name], female    DOB: 11-14-1940, 70 y.o.   MRN: [id number]  HPI Mr.s Pickert was recently hospitalized on the GI surface for hematochezia with a suspicion for c. Diff. She ruled out for C.Diff. She did have repeat colonoscopy - see report.   She has had a recent MBSS eval with silent aspiration and changes secondary to fibrotic changes due to cancer treatment. She has been compensating. Final recommendations were for thin liquids, head turn to left with swallow.  During her hospital stay she was diagnosed with a UTI and she has completed antibiotic treatment for this.   A question of diabetes was raised during her hospitalization due to elevated serum glucose. A1C was ordered = 6.1%. Not diabetic by this criteria but she is advised to follow a no sugar low carb diet.   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 Her husband has been well aware and offers a supportive environment even though he is an object ofher deliusions. During her hospital stay psychiatry started abilify for mgt of delusional thinking. This was not well tolerated and the patient has stopped the medication. Mr. Gashi states that now that she is at home she is at her baseline and he is OK with her not taking any additional psychiatric medications.  I have reviewed the patient's medical history in detail and updated the computerized patient record.    Review of Systems System review is negative for any constitutional, cardiac, pulmonary, GI or neuro symptoms or complaints at today's visit     Objective:   Physical Exam Vitals reviewed - mild elevation in BP - will require home monitoring Gen'l - WNWD white woman in no distress, she is calm and cooperative HEENT C&S clear Chest - good breath sounds, no wheezing Cor - IRIR but rate controlled. Neuro - A&O x 3, asks reasonable questions       Assessment & Plan:  Hyperglycemia -  Patient had elevated serum glucose during recent hospitalization but A1C is at the top end of normal.  Plan - routine dietary precautions           No indication for medication at this time.           Repeat A1C in 6 months.

## 2011-03-07 NOTE — Assessment & Plan Note (Signed)
Completed treatment for recent UTI. Asymptomatic at this time.

## 2011-03-07 NOTE — Assessment & Plan Note (Signed)
Stable with no evidence of pulmonary involvement. She and her husband have been educated about precautions.

## 2011-03-07 NOTE — Assessment & Plan Note (Signed)
Injury from a fall. She is now recovered able to ambulate without assistance and to bear weight without pain. Consider this problem resolved.

## 2011-03-07 NOTE — Assessment & Plan Note (Signed)
BP Readings from Last 3 Encounters:  03/04/11 150/80  03/01/11 136/62  01/10/11 126/58   Mild elevation at today's visit but previously well controlled.  Plan - no change in medications at this time.

## 2011-03-15 ENCOUNTER — Inpatient Hospital Stay (HOSPITAL_COMMUNITY)
Admission: EM | Admit: 2011-03-15 | Discharge: 2011-03-24 | DRG: 481 | Disposition: A | Discharge status: Skilled Nursing Facility | Payer: Medicare HMO | Attending: Internal Medicine | Admitting: Internal Medicine

## 2011-03-15 ENCOUNTER — Encounter (HOSPITAL_COMMUNITY): Payer: Self-pay

## 2011-03-15 ENCOUNTER — Emergency Department (HOSPITAL_COMMUNITY): Payer: Medicare HMO

## 2011-03-15 ENCOUNTER — Telehealth: Payer: Self-pay | Admitting: *Deleted

## 2011-03-15 DIAGNOSIS — E785 Hyperlipidemia, unspecified: Secondary | ICD-10-CM | POA: Diagnosis present

## 2011-03-15 DIAGNOSIS — S72143A Displaced intertrochanteric fracture of unspecified femur, initial encounter for closed fracture: Principal | ICD-10-CM | POA: Diagnosis present

## 2011-03-15 DIAGNOSIS — I4891 Unspecified atrial fibrillation: Secondary | ICD-10-CM | POA: Diagnosis present

## 2011-03-15 DIAGNOSIS — Z7982 Long term (current) use of aspirin: Secondary | ICD-10-CM

## 2011-03-15 DIAGNOSIS — Z853 Personal history of malignant neoplasm of breast: Secondary | ICD-10-CM

## 2011-03-15 DIAGNOSIS — N390 Urinary tract infection, site not specified: Secondary | ICD-10-CM | POA: Diagnosis present

## 2011-03-15 DIAGNOSIS — I10 Essential (primary) hypertension: Secondary | ICD-10-CM | POA: Diagnosis present

## 2011-03-15 DIAGNOSIS — A498 Other bacterial infections of unspecified site: Secondary | ICD-10-CM | POA: Diagnosis present

## 2011-03-15 DIAGNOSIS — Z7901 Long term (current) use of anticoagulants: Secondary | ICD-10-CM

## 2011-03-15 DIAGNOSIS — K5190 Ulcerative colitis, unspecified, without complications: Secondary | ICD-10-CM | POA: Diagnosis present

## 2011-03-15 DIAGNOSIS — W19XXXA Unspecified fall, initial encounter: Secondary | ICD-10-CM | POA: Diagnosis present

## 2011-03-15 DIAGNOSIS — Z9010 Acquired absence of unspecified breast and nipple: Secondary | ICD-10-CM

## 2011-03-15 DIAGNOSIS — E538 Deficiency of other specified B group vitamins: Secondary | ICD-10-CM | POA: Diagnosis present

## 2011-03-15 DIAGNOSIS — G40909 Epilepsy, unspecified, not intractable, without status epilepticus: Secondary | ICD-10-CM | POA: Diagnosis present

## 2011-03-15 DIAGNOSIS — E876 Hypokalemia: Secondary | ICD-10-CM | POA: Diagnosis not present

## 2011-03-15 DIAGNOSIS — F0390 Unspecified dementia without behavioral disturbance: Secondary | ICD-10-CM | POA: Diagnosis present

## 2011-03-15 DIAGNOSIS — E039 Hypothyroidism, unspecified: Secondary | ICD-10-CM | POA: Diagnosis present

## 2011-03-15 DIAGNOSIS — E871 Hypo-osmolality and hyponatremia: Secondary | ICD-10-CM | POA: Diagnosis present

## 2011-03-15 DIAGNOSIS — Z803 Family history of malignant neoplasm of breast: Secondary | ICD-10-CM

## 2011-03-15 HISTORY — DX: Unspecified dementia, unspecified severity, without behavioral disturbance, psychotic disturbance, mood disturbance, and anxiety: F03.90

## 2011-03-15 LAB — TSH: TSH: 0.833 u[IU]/mL (ref 0.350–4.500)

## 2011-03-15 LAB — URINALYSIS, ROUTINE W REFLEX MICROSCOPIC
Bilirubin Urine: NEGATIVE
Glucose, UA: NEGATIVE mg/dL
Hgb urine dipstick: NEGATIVE
Ketones, ur: NEGATIVE mg/dL
Protein, ur: NEGATIVE mg/dL

## 2011-03-15 LAB — BASIC METABOLIC PANEL
BUN: 7 mg/dL (ref 6–23)
CO2: 36 mEq/L — ABNORMAL HIGH (ref 19–32)
Calcium: 9 mg/dL (ref 8.4–10.5)
Calcium: 8.7 mg/dL (ref 8.4–10.5)
Creatinine, Ser: 0.64 mg/dL (ref 0.50–1.10)
Creatinine, Ser: 0.53 mg/dL (ref 0.50–1.10)
GFR calc non Af Amer: >60 mL/min (ref >60)
Glucose, Bld: 128 mg/dL — ABNORMAL HIGH (ref 70–99)
Sodium: 120 mEq/L — ABNORMAL LOW (ref 135–145)

## 2011-03-15 LAB — PROTIME-INR
INR: 1.9 — ABNORMAL HIGH (ref 0.00–1.49)
Prothrombin Time: 22.1 seconds — ABNORMAL HIGH (ref 11.6–15.2)

## 2011-03-15 LAB — DIFFERENTIAL
Basophils Relative: 0 % (ref 0–1)
Monocytes Absolute: 0.9 10*3/uL (ref 0.1–1.0)
Monocytes Relative: 11 % (ref 3–12)
Neutro Abs: 5.2 10*3/uL (ref 1.7–7.7)

## 2011-03-15 LAB — CK TOTAL AND CKMB (NOT AT ARMC)
CK, MB: 2.4 ng/mL (ref 0.3–4.0)
Relative Index: INVALID (ref 0.0–2.5)
Total CK: 19 U/L (ref 7–177)

## 2011-03-15 LAB — CBC
Hemoglobin: 11.4 g/dL — ABNORMAL LOW (ref 12.0–15.0)
MCH: 29.3 pg (ref 26.0–34.0)
MCHC: 34.9 g/dL (ref 30.0–36.0)
MCV: 84.1 fL (ref 78.0–100.0)

## 2011-03-15 LAB — CARDIAC PANEL(CRET KIN+CKTOT+MB+TROPI)
Relative Index: INVALID (ref 0.0–2.5)
Relative Index: INVALID (ref 0.0–2.5)
Total CK: 31 U/L (ref 7–177)
Troponin I: <0.3 ng/mL (ref <0.30)

## 2011-03-15 LAB — OSMOLALITY: Osmolality: 248 mOsm/kg — ABNORMAL LOW (ref 275–300)

## 2011-03-15 LAB — CREATININE, URINE, RANDOM: Creatinine, Urine: 59.67 mg/dL

## 2011-03-15 NOTE — Discharge Summary (Signed)
NAMESRESHTA, CRESSLER            ACCOUNT NO.:  [id number]  MEDICAL RECORD NO.:  [id number]  LOCATION:  5024                         FACILITY:  [facility]  PHYSICIAN:  Barbette Hair. Arlyce Dice, MD,FACGDATE OF BIRTH:  09/08/1940  DATE OF ADMISSION:  02/07/2011 DATE OF DISCHARGE:  02/18/2011                              DISCHARGE SUMMARY   ADMITTING DIAGNOSES: 1. Chronic diarrhea with bloody stools.  Rule out Clostridium     difficile infection refractory to previous antibiotic therapy, rule     out ulcerative colitis, rule out ischemic colitis. 2. History of seizure disorders, quiescent on stable doses of     phenobarbital and Dilantin. 3. History of rate-controlled atrial fibrillation stable on diltiazem. 4. Abdominal bruit. 5. Protein malnutrition. 6. 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 7. Hypertension. 8. Nondisplaced inferior pubic rami fracture March 2012, managed     medically. 9. Hyperlipidemia. 10.Escherichia coli urinary tract infection March 2012. 11.History of subdural hematoma, status post craniotomy and evacuation     of hematoma in 1992. 12.Bilateral breast cancer.  Status post right mastectomy and     chemotherapy in 1999.  Status post left breast lumpectomy and     radiation therapy in 2007. 13.History of oral squamous cell cancer on three separate occasions     treated with surgery and radiation. 14.Status post bilateral cataract extractions with lens implants. 15.Status post left carotid endarterectomy in 2000. 16.History of adenomatous colon polyps.  Most recent colonoscopy in     May 2011 was negative for recurrent polyps.  DISCHARGE DIAGNOSES: 1. Ulcerative colitis slowly improving.  Bloody stool has resolved and     stool frequency has diminished; however, stools are still loose. 2. Dysphagia, probably chronic, but now being worked up for the first     time, multifactorial.  The patient's history of oral squamous cell     and  surgeries and radiation has negatively impacted her swallowing. 3. Dementia with delirium during inpatient stay.  She has been started     on Abilify.  She was started on Abilify under the guidance of Dr.     Rogers Blocker [facility]. 4. Glucose intolerance, new diagnosis of diabetes mellitus type 2.     This has been unmasked by use of steroids.  She was not started on     any oral medications or insulin. 5. Failure to thrive and protein malnutrition. 6. Ongoing balance and gait disorders.  Has benefited from physical     therapy during this hospitalization and will continue this as an     outpatient. 7. Hyponatremia. 8. Hypokalemia. 9. History of rate-controlled atrial fibrillation.  For the most part,     the patient remained in sinus rhythm during this admission. 10.Klebsiella urinary tract infection.  She completed a 7-day course     of Septra.  CONSULTATION:  Psychiatric with Dr. Rogers Blocker.  PROCEDURES:  Incomplete colonoscopy converted to flexible sigmoidoscopy on February 08, 2011, by Dr. Erick Blinks.  He encountered moderately severe colitis in the rectum and sigmoid with friability, erythema, ulceration, and granularity.  Procedure was stopped at the sigmoid because of severe inflammation and patient discomfort.  Biopsy, pathology findings that  of idiopathic inflammatory bowel disease with chronic mucosal injury and active colitis, which were diffuse and findings consistent with ulcerative colitis.  BRIEF HISTORY:  Ms.  Cooperwood is a 70 year old white female who started having diarrhea in March shortly after completing treatment for an UTI with ciprofloxacin.  She had been treated under the care of Dr. Jarold Motto with Flagyl, vancomycin, probiotic for presumed C. Diff. However, initial PCR testing for C. diff were negative.  The patient developed bloody diarrhea and underwent flexible sigmoidoscopy on December 03, 2010, showing severe proctosigmoiditis.  Biopsies showed  active colitis with features strongly favoring infectious and not favoring inflammatory bowel disease.  Dr. Jarold Motto started treating the patient with prednisone and shortly thereafter added Lialda.  Her symptoms of bloody diarrhea continued following the flexible sigmoidoscopy.  On January 10, 2011, C. diff toxin repeat test was positive, this was the first time the C. diff test was actually positive.  The Lialda and prednisone were discontinued and vancomycin was added.  However, the patient's symptoms continued, and she had taken a fall in the morning in an attempt to rush to the bathroom on February 07, 2011.  She was having five or so bloody bowel movements a day.  She had lost about 10 pounds.  She was not having any nausea or vomiting, but she was having abdominal distress. Dr. Jarold Motto arranged for her to have a direct admission to the hospital for further evaluation of refractory bloody diarrhea.  LABORATORY DATA:  At discharge, hemoglobin 11.6, hematocrit 34.2, MCV 92.7, platelet count 316, and white blood cell count 14.2.  On admission, the hemoglobin was 11.6, white cell count was 14.7, hematocrit 33.6.  Stool cultures negative.  Fecal lactoferrin positive. Stool Giardia and Cryptosporidium negative.  Stools C. diff negative on February 07, 2011 as well as on February 15, 2011.  Sodium 133, potassium 4.0, chloride 98, CO2 of 29, glucose 122, BUN 8, creatinine 0.63.  TPMT activity 22.  This is within normal range for TPMT activity.  Test performed by [facility].  Erythrocyte sedimentation rate 12. Microscopic urinalysis showed many bacteria, white blood cells too numerous to count and a few squamous epithelials.  The urinalysis showed a large amount of leukocytes.  Urine culture grew out Klebsiella pneumoniae.  Prealbumin level 8.6.  TSH level 0.595.  IMAGING STUDIES:  X-ray was used for a swallowing function test.  The MODIFIED BARIUM SWALLOW test showed moderate dysphagia,  likely chronic. This has probably been present for many years and is consistent with post radiation esophageal fibrosis, which prevents full mobility of swallowing musculature.  Limited propulsion of bolus through the impaired airway closure.  Consistent aspiration of thin and nectar thick liquids.  Strong spontaneous cough, generally protective in nature and able to eject aspirate from the anterior trachea.  Head turning to the left also facilitated airway protection.  Dietary recommendations were to continue solid foods.  There were no consistency restrictions warranted.  The patient is advised to turn head towards the left shoulder prior to swallowing thin liquids.  Also advised to clear her throat immediately after swallowing for airway protection.  Crushing meds and administering with applesauce consistency foods will be beneficial.  Xerostomia would also benefit from mouth rinse, such as  Biotene, to aid in saliva production, .  She was allowed thin liquids.  HOSPITAL COURSE:  The patient was admitted to a non-telemetry bed.  The initial diet was clear liquids.  It was slowly advanced to a solid diet.  Bloody  stool persisted for several days, but ultimately the blood in the stools was no longer grossly visible.  Stools had declined to about two or three per day.  They were still loose in nature.  She was having better ability to maintain continence.  The patient received a workup for inappropriate behavior.  She is impulsive about getting out of bed, although she has been advised to call for staff help because she is very unsteady on her feet. Sometimes, her thoughts were a bit delusional, although she was never disoriented as to where she was or why she was hospitalized. Apparently, she has been exhibiting some suspicious, paranoid-type thoughts at home thinking that her husband is having an affair an trying to have her placed in a nursing home.  Psychiatry saw the  patient and  her with dementia.  She was started her on Abilify 2 mg at  bedtime. Later he recommended a dose increase to 5 mg.  Because of risk of extrapyramidal symptoms, we delayed the increase until she is  seen by Dr. Debby Bud, her internist. Her paranoid behavior had decreased  along with her mania.  The patient has poor balance.  A Berg balance test was performed twice. The first test on February 14, 2011, she scored 26/56.  On the second test on February 17, 2011, she scored a 29/56.  Anything less than 36 on this balance test represents a close to 100% risk for falls.  The patient  worked on balance and strengthening activities with physical therapy.  She will continue outpatient physical therapy at [facility]. She is familiar with and has great confidence with PT staff there  Klebsiella urinary tract infection.  The patient had some dysuria, urinalysis and culture confirmed the urinary tract infection and she was treated with 7 days of Septra.  Dysphagia.  It was noted by staff during her hospital stay.  She tends to have a wet sounding vocal quality.  She complains discomfort in the left neck.  She is status post radiation as well as surgeries for oral squamous cell cancers.  Bedside swallow and then a modified barium swallow study were performed, which confirmed a likely chronic dysphagia.  Dietary recommendations were outlined by the Speech-Language pathologist and will be followed as an outpatient.  The patient underwent a flexible sigmoidoscopy, which confirmed ongoing rectosigmoid colitis.  Biopsies returned and confirmed ulcerative colitis.  She was started on maximum dose Asacol.  First, she was treated with Solu-Medrol, but ultimately was converted to oral prednisone at 40 mg a day.  She also was receiving Rowasa enemas at bedtime.  Plan is to keep her on 40 mg of prednisone daily with taper not to begin until she is seen back in the office by Dr. Jarold Motto.  She also  continues on the 2.4 g of Asacol daily.  Rowasa enemas will not be continued when she goes home as compliance would likely be difficult  The patient's blood sugars were elevated with max serum glucose of 164.  We monitored Accu-Cheks, and she never had blood sugar readings beyond 175.  The sugars are certainly elevated because of the use of steroids.  Because she is eventually going to be tapering off prednisone and her sugars are not that high, we delayed starting any oral diabetic agents.  The patient's hemoglobin A1c is 6.1, which correlates with a mean plasma glucose of 128.  DIET AT DISCHARGE:  Regular with thin liquids with aspiration and swallowing precautions as outlined above under barium  swallow study, also  to avoid concentrated sweets and high fiber foods.  RETURN OFFICE VISITS:  Dr. Sheryn Bison March 01, 2011, at 1:45 p.m. The patient's husband should arrange for a followup appointment with Dr. Debby Bud to follow up on the patient's blood sugars and also to possibly increase the Abilify from 2 mg to 5 mg daily.  MEDICATIONS AT DISCHARGE: 1. Biotene oral rinse twice daily. 2. Abilify 2 mg p.o. at bedtime. 3. Lidocaine 2% viscous solution, apply to oral ulcers p.r.n. 4. Loratadine 10 mg tablet once daily. 5. Mesalamine/Asacol 400 mg tablet 4 capsules 3 times daily. 6. Prednisone 20 mg tablet 2 p.o. daily. 7. Aspirin 81 mg daily.  Note, this is a reduction in the dose from     325 mg daily. 8. Calcium carbonate 600 mg with vitamin D 400 units 1 tablet daily. 9. Diltiazem CD/XT 180 mg 1 tablet daily. 10.Folic acid 1 mg daily. 11.Levothyroxine 50 mcg daily. 12.Phenobarbital 97.2 mg 1 tablet twice daily. 13.Phenytoin 100 mg 3 tablets daily at bedtime. 14.Rosuvastatin 20 mg 1 tablet daily at bedtime.  List of medications which were discontinued during this admission: 1. Magnesium oxide 400 mg.  This often causes diarrhea. 2. Vitamin C 500 mg.  This also causes  diarrhea. 3. Alendronate 70 mg once weekly was discontinued because of her     ongoing problems with dysphagia.     [name], PA-C   ______________________________ Barbette Hair. Arlyce Dice, MD,FACG    SG/MEDQ  D:  02/18/2011  T:  02/19/2011  Job:  [id number]  Electronically Signed by [name] PA-C on 02/22/2011 12:10:18 PM Electronically Signed by Melvia Heaps MDFACG on 03/15/2011 08:39:10 AM

## 2011-03-15 NOTE — Telephone Encounter (Signed)
Husband called - pt is in ICU at [facility].

## 2011-03-16 ENCOUNTER — Inpatient Hospital Stay (HOSPITAL_COMMUNITY): Payer: Medicare HMO

## 2011-03-16 DIAGNOSIS — N390 Urinary tract infection, site not specified: Secondary | ICD-10-CM

## 2011-03-16 DIAGNOSIS — I4891 Unspecified atrial fibrillation: Secondary | ICD-10-CM

## 2011-03-16 DIAGNOSIS — F068 Other specified mental disorders due to known physiological condition: Secondary | ICD-10-CM

## 2011-03-16 DIAGNOSIS — S72109A Unspecified trochanteric fracture of unspecified femur, initial encounter for closed fracture: Secondary | ICD-10-CM

## 2011-03-16 DIAGNOSIS — I10 Essential (primary) hypertension: Secondary | ICD-10-CM

## 2011-03-16 DIAGNOSIS — E871 Hypo-osmolality and hyponatremia: Secondary | ICD-10-CM

## 2011-03-16 DIAGNOSIS — Z9181 History of falling: Secondary | ICD-10-CM

## 2011-03-16 DIAGNOSIS — A498 Other bacterial infections of unspecified site: Secondary | ICD-10-CM

## 2011-03-16 LAB — BASIC METABOLIC PANEL
CO2: 35 mEq/L — ABNORMAL HIGH (ref 19–32)
Calcium: 8.7 mg/dL (ref 8.4–10.5)
GFR calc Af Amer: >60 mL/min (ref >60)
GFR calc non Af Amer: >60 mL/min (ref >60)
Sodium: 127 mEq/L — ABNORMAL LOW (ref 135–145)

## 2011-03-16 LAB — COMPREHENSIVE METABOLIC PANEL
ALT: 17 U/L (ref 0–35)
AST: 14 U/L (ref 0–37)
Alkaline Phosphatase: 82 U/L (ref 39–117)
CO2: 31 mEq/L (ref 19–32)
Chloride: 92 mEq/L — ABNORMAL LOW (ref 96–112)
GFR calc non Af Amer: >60 mL/min (ref >60)
Potassium: 4.4 mEq/L (ref 3.5–5.1)
Sodium: 128 mEq/L — ABNORMAL LOW (ref 135–145)
Total Bilirubin: 0.2 mg/dL — ABNORMAL LOW (ref 0.3–1.2)

## 2011-03-16 LAB — CBC
MCV: 85.4 fL (ref 78.0–100.0)
Platelets: 146 10*3/uL — ABNORMAL LOW (ref 150–400)
RBC: 2.94 MIL/uL — ABNORMAL LOW (ref 3.87–5.11)
WBC: 8.4 10*3/uL (ref 4.0–10.5)

## 2011-03-16 LAB — CORTISOL: Cortisol, Plasma: 23 ug/dL

## 2011-03-17 LAB — URINE CULTURE: Culture  Setup Time: 201209111305

## 2011-03-17 LAB — COMPREHENSIVE METABOLIC PANEL
ALT: 19 U/L (ref 0–35)
Albumin: 2.9 g/dL — ABNORMAL LOW (ref 3.5–5.2)
Alkaline Phosphatase: 77 U/L (ref 39–117)
BUN: <3 mg/dL — ABNORMAL LOW (ref 6–23)
Calcium: 8.1 mg/dL — ABNORMAL LOW (ref 8.4–10.5)
Glucose, Bld: 168 mg/dL — ABNORMAL HIGH (ref 70–99)
Potassium: 3.6 mEq/L (ref 3.5–5.1)
Sodium: 127 mEq/L — ABNORMAL LOW (ref 135–145)
Total Protein: 5.8 g/dL — ABNORMAL LOW (ref 6.0–8.3)

## 2011-03-17 LAB — CBC
MCV: 86.5 fL (ref 78.0–100.0)
Platelets: 149 10*3/uL — ABNORMAL LOW (ref 150–400)
RBC: 3.11 MIL/uL — ABNORMAL LOW (ref 3.87–5.11)
RDW: 13.7 % (ref 11.5–15.5)
WBC: 14.3 10*3/uL — ABNORMAL HIGH (ref 4.0–10.5)

## 2011-03-17 LAB — PROTIME-INR: Prothrombin Time: 15 seconds (ref 11.6–15.2)

## 2011-03-18 LAB — BASIC METABOLIC PANEL
BUN: 4 mg/dL — ABNORMAL LOW (ref 6–23)
Calcium: 8.8 mg/dL (ref 8.4–10.5)
Creatinine, Ser: <0.47 mg/dL — ABNORMAL LOW (ref 0.50–1.10)

## 2011-03-18 LAB — CBC
HCT: 27 % — ABNORMAL LOW (ref 36.0–46.0)
Hemoglobin: 9.3 g/dL — ABNORMAL LOW (ref 12.0–15.0)
MCH: 29.6 pg (ref 26.0–34.0)
MCHC: 34.4 g/dL (ref 30.0–36.0)
Platelets: 178 10*3/uL (ref 150–400)
RDW: 13.8 % (ref 11.5–15.5)
WBC: 14 10*3/uL — ABNORMAL HIGH (ref 4.0–10.5)

## 2011-03-18 LAB — PROTIME-INR: Prothrombin Time: 14.7 seconds (ref 11.6–15.2)

## 2011-03-19 LAB — CBC
MCH: 28.8 pg (ref 26.0–34.0)
MCHC: 34 g/dL (ref 30.0–36.0)
Platelets: 171 10*3/uL (ref 150–400)

## 2011-03-19 LAB — IRON AND TIBC: UIBC: 278 ug/dL (ref 125–400)

## 2011-03-19 LAB — BASIC METABOLIC PANEL
Calcium: 8.4 mg/dL (ref 8.4–10.5)
Creatinine, Ser: <0.47 mg/dL — ABNORMAL LOW (ref 0.50–1.10)
Sodium: 126 mEq/L — ABNORMAL LOW (ref 135–145)

## 2011-03-19 LAB — FERRITIN: Ferritin: 18 ng/mL (ref 10–291)

## 2011-03-19 LAB — PROTIME-INR
INR: 1.2 (ref 0.00–1.49)
Prothrombin Time: 15.5 seconds — ABNORMAL HIGH (ref 11.6–15.2)

## 2011-03-19 LAB — VITAMIN B12: Vitamin B-12: 184 pg/mL — ABNORMAL LOW (ref 211–911)

## 2011-03-19 NOTE — Op Note (Signed)
NAMEANGELIKA, [name]            ACCOUNT NO.:  [id number]  MEDICAL RECORD NO.:  [id number]  LOCATION:  3307                         FACILITY:  [facility]  PHYSICIAN:  Feliberto Gottron. Turner Daniels, M.D.   DATE OF BIRTH:  08-16-1940  DATE OF PROCEDURE:  03/16/2011 DATE OF DISCHARGE:                              OPERATIVE REPORT   PREOPERATIVE DIAGNOSIS:  Three-part right hip intertrochanteric fracture displaced.  POSTOPERATIVE DIAGNOSIS:  Three-part right hip intertrochanteric fracture displaced.  PROCEDURE:  Open reduction internal fixation using a four-hole 140- degree side plate with an 85 mm lag screw, TK II model from [employer].  SURGEON:  Feliberto Gottron. Turner Daniels, MD  FIRST ASSISTANT:  Shirl Harris PA-C  ANESTHETIC:  Endotracheal.  ESTIMATED BLOOD LOSS:  100 mL.  FLUID REPLACEMENT:  A liter of crystalloid.  DRAINS PLACED:  None.  TOURNIQUET TIME:  None.  INDICATIONS FOR PROCEDURE:  [name] slipped and fell at home early in the morning on March 15, 2011, and sustained a three-part intertrochanteric fracture of the left hip.  She did have weakness and dizziness during that evening and when she was brought to the emergency room she had a sodium of 120 and a K of 2.4.  She was admitted to the step-down unit by Internal Medicine for open reduction and internal fixation and it should be noted she had been in the hospital last month for bloody diarrhea.  She also has a history of dementia, hypothyroidism, hypertension, as well as multiple other medical problems.  In any event, they were able to get her K up to 4.4 today, sodium up to 129 and with these better electrolyte, she is taken for open reduction and internal fixation of her hip to decrease pain and increase function.  Risks and benefits of surgery have been discussed, questions answered.  DESCRIPTION OF PROCEDURE:  The patient was identified by armband and taken to the holding area at [facility].  She  received preoperative Ancef and was taken to the operating room 15.  Appropriate anesthetic monitors were attached and endotracheal anesthesia induced with the patient in the gurney, and she was then transferred to a radiolucent operating table.  She was rolled into the left lateral decubitus position and fixed there with a silver Mark II pelvic clamp and under C-arm imaging control, a closed reduction of the intertrochanteric fracture was accomplished with traction, internal and external rotation of the hip under again C-arm imaging.  Satisfied, reduction pillows were placed to hold the limb in a correct level of rotation.  We then prepped and draped the lateral aspect of the hip and thigh in the usual sterile fashion.  Time-out procedure was preformed. We began the operation by making an 8-cm incision beginning over the flare of the greater trochanter going distally for 8 cm.  We cut through the skin and subcutaneous tissue to the IT band and split the muscles of the vastus lateralis down to bone and under C-arm imaging control in theAP and lateral planes placed a guide pin using the 135 guide up into the femoral head from the lateral aspect of the flare of the greater trochanter.  The pin went right through the center of  the neck and head. Because the hip was noted to be in just slight amount of varus, we then selected.  We elected to use a four-hole 140-degree side plate as opposed to a 135.  We measured the guide pin for an 85-mm lag screw, tapped the drill hole, and inserted an 85 mm lag screws so it came within about a centimeter of the subchondral bone in the center of the head.  We actually had excellent bone purchase when the screw was placed.  At this point, we then placed the four-hole 140-degree side plate over the guide pin and clamped it down to the shaft of the femur with a [place] claw and then inserted four bicortical screws obtaining good firm fixation.  C-arm images were  taken to the AP and lateral planes confirming good position of the screws and the lag screw.  The wound was then irrigated out with normal saline solution.  We closed in layers of 0 Vicryl suture in the vastus lateralis, running #1 Vicryl suture in the IT band, 2-0 undyed Vicryl suture in the subcutaneous tissue because she was relatively slender and then running interlocking 3-0 nylon suture.  A dressing of Xeroform and Mepilex was applied.  The patient unclamped, rolled supine, awakened, extubated, and taken to the recovery room without difficulty.     Feliberto Gottron. Turner Daniels, M.D.     Ovid Curd  D:  03/16/2011  T:  03/17/2011  Job:  [id number]  Electronically Signed by Gean Birchwood M.D. on 03/19/2011 09:48:35 PM

## 2011-03-20 LAB — CROSSMATCH: Unit division: 0

## 2011-03-20 LAB — BASIC METABOLIC PANEL
BUN: 9 mg/dL (ref 6–23)
Calcium: 8.4 mg/dL (ref 8.4–10.5)
Potassium: 3.5 mEq/L (ref 3.5–5.1)
Sodium: 127 mEq/L — ABNORMAL LOW (ref 135–145)

## 2011-03-20 LAB — PROTIME-INR: Prothrombin Time: 18 seconds — ABNORMAL HIGH (ref 11.6–15.2)

## 2011-03-21 LAB — BASIC METABOLIC PANEL
Calcium: 8.4 mg/dL (ref 8.4–10.5)
Creatinine, Ser: <0.47 mg/dL — ABNORMAL LOW (ref 0.50–1.10)
Glucose, Bld: 168 mg/dL — ABNORMAL HIGH (ref 70–99)
Sodium: 125 mEq/L — ABNORMAL LOW (ref 135–145)

## 2011-03-21 LAB — CULTURE, BLOOD (ROUTINE X 2): Culture: NO GROWTH

## 2011-03-22 LAB — BASIC METABOLIC PANEL
BUN: 8 mg/dL (ref 6–23)
BUN: 8 mg/dL (ref 6–23)
Calcium: 8.2 mg/dL — ABNORMAL LOW (ref 8.4–10.5)
Calcium: 8.8 mg/dL (ref 8.4–10.5)
Creatinine, Ser: <0.47 mg/dL — ABNORMAL LOW (ref 0.50–1.10)
Creatinine, Ser: 0.5 mg/dL (ref 0.50–1.10)
GFR calc Af Amer: >60 mL/min (ref >60)
Glucose, Bld: 163 mg/dL — ABNORMAL HIGH (ref 70–99)
Potassium: 3.8 mEq/L (ref 3.5–5.1)

## 2011-03-22 LAB — CBC
HCT: 24.7 % — ABNORMAL LOW (ref 36.0–46.0)
MCH: 28.2 pg (ref 26.0–34.0)
MCHC: 33.6 g/dL (ref 30.0–36.0)
MCV: 84 fL (ref 78.0–100.0)
Platelets: ADEQUATE 10*3/uL (ref 150–400)
RDW: 14.2 % (ref 11.5–15.5)
WBC: 8.9 10*3/uL (ref 4.0–10.5)

## 2011-03-23 LAB — BASIC METABOLIC PANEL
CO2: 30 mEq/L (ref 19–32)
Calcium: 8.5 mg/dL (ref 8.4–10.5)
Chloride: 91 mEq/L — ABNORMAL LOW (ref 96–112)
Creatinine, Ser: <0.47 mg/dL — ABNORMAL LOW (ref 0.50–1.10)
Glucose, Bld: 156 mg/dL — ABNORMAL HIGH (ref 70–99)
Sodium: 128 mEq/L — ABNORMAL LOW (ref 135–145)

## 2011-03-23 LAB — CBC
HCT: 22.8 % — ABNORMAL LOW (ref 36.0–46.0)
Hemoglobin: 7.3 g/dL — ABNORMAL LOW (ref 12.0–15.0)
MCH: 29 pg (ref 26.0–34.0)
MCH: 29.2 pg (ref 26.0–34.0)
MCV: 84.5 fL (ref 78.0–100.0)
MCV: 86.4 fL (ref 78.0–100.0)
Platelets: 296 10*3/uL (ref 150–400)
RBC: 2.52 MIL/uL — ABNORMAL LOW (ref 3.87–5.11)
RBC: 2.64 MIL/uL — ABNORMAL LOW (ref 3.87–5.11)
WBC: 10 10*3/uL (ref 4.0–10.5)

## 2011-03-24 LAB — CBC
HCT: 25.9 % — ABNORMAL LOW (ref 36.0–46.0)
MCH: 28.9 pg (ref 26.0–34.0)
MCHC: 33.2 g/dL (ref 30.0–36.0)
RDW: 14.7 % (ref 11.5–15.5)

## 2011-03-24 LAB — BASIC METABOLIC PANEL
BUN: 9 mg/dL (ref 6–23)
CO2: 32 mEq/L (ref 19–32)
Calcium: 9.1 mg/dL (ref 8.4–10.5)
Chloride: 92 mEq/L — ABNORMAL LOW (ref 96–112)
Creatinine, Ser: <0.47 mg/dL — ABNORMAL LOW (ref 0.50–1.10)
Glucose, Bld: 134 mg/dL — ABNORMAL HIGH (ref 70–99)

## 2011-03-24 LAB — PROTIME-INR: INR: 1.48 (ref 0.00–1.49)

## 2011-04-01 ENCOUNTER — Ambulatory Visit: Payer: Medicare HMO | Admitting: Gastroenterology

## 2011-04-07 ENCOUNTER — Telehealth: Payer: Self-pay | Admitting: *Deleted

## 2011-04-07 NOTE — Telephone Encounter (Signed)
Spoke w/husband - He says she was supposed to stop prednisone a week ago. Pt is in a Rehab facility now and needs order from MD to stop this medication. OK for this order?   (Per husband, GI will not see patient in the office b/c she is in a facility.)

## 2011-04-07 NOTE — Discharge Summary (Signed)
**Note [name] via Obfuscation** [name], [name] NO.:  [id number]  MEDICAL RECORD NO.:  [id number]  LOCATION:  5004                         FACILITY:  [facility]  PHYSICIAN:  Theressa Millard, M.D.    DATE OF BIRTH:  August 21, 1940  DATE OF ADMISSION:  03/15/2011 DATE OF DISCHARGE:  03/23/2011                              DISCHARGE SUMMARY   ADMITTING DIAGNOSIS:  Hip fracture.  DISCHARGE DIAGNOSES: 1. Right hip fracture, status post operative reduction and internal     fixation. 2. Escherichia coli urinary tract infection, status post treatment     during this hospitalization. 3. Dementia. 4. Hypertension. 5. Paroxysmal atrial fibrillation with high fall risk, not a long-term     warfarin candidate, use aspirin when current Coumadin is     discontinued once ambulatory. 6. Hyponatremia. 7. Ulcerative colitis. 8. Hypothyroidism. 9. Vitamin B12 deficiency, diagnosed in this admission. 10.History of Clostridium difficile colitis.11.History of seizure disorder.  The patient is a 70 year old white female who had a mechanical fall and fractured her right hip.  She presented to the emergency department and was admitted to Medicine because of her complex medical problems.  She was seen by Dr. Debby Bud and noted to be in atrial fibrillation intermittently which is a long-time problem.  She was started initial on Rocephin and then when cultures came back she was switched to Cipro. She was maintained on her home medicines and actually did quite well during the hospitalization.  She was slow to recover at first from the stress of surgery, but then did very well and has been working with Physical Therapy.  She is now transferred to a skilled nursing facility for further care until she is ambulatory enough to return to her home. She has a very involved husband who has been active in her care as well.  She has history of ulcerative colitis and this has apparently been flaring recently and she is on  mesalamine and tapering dose of prednisone.  The prednisone should be continued at 20 mg daily for 1 week and then decrease to 10 mg daily until she has followup with Dr. Debby Bud.  Also, during this hospitalization, she was found to have a vitamin B12 deficiency and she is being started on parenteral vitamin B12.  She is given warfarin for DVT prophylaxis during this time, but in the long-term she is not a long-term Coumadin candidate and this should not be continued beyond the time that she has limited ambulation.  She is discharged in improved condition.  DISCHARGE MEDICATIONS: 1. Prednisone 20 mg daily for 1 week and 10 mg daily until followup     with Dr. Debby Bud. 2. Vitamin B12 1 mg IM every 2 weeks for 1 month, then monthly. 3. Warfarin adjusted dose Coumadin to keep an INR of 2.2-3.  Most     recently, she has been getting Coumadin at 5 mg, but on March 23, 2011, Coumadin was actually held. 4. Calcium carbonate with vitamin D 600/400 once daily. 5. Diltiazem CD 180 mg daily. 6. Folic acid 1 mg daily. 7. Hydrochlorothiazide 25 mg daily. 8. Levothyroxine 50 mcg daily. 9. Asacol 400 mg 3 capsules 3 times a day. 10.Phenobarbital 97.2  mg twice daily. 11.Dilantin 100 mg 3 tablets daily at bedtime. 12.Crestor 20 mg daily.  ACTIVITY:  Per Physical Therapy.  DIET:  No added salt.     Theressa Millard, M.D.     JO/MEDQ  D:  03/23/2011  T:  03/23/2011  Job:  [id number]  Electronically Signed by Theressa Millard M.D. on 04/07/2011 01:40:07 PM

## 2011-04-07 NOTE — Telephone Encounter (Signed)
1. OK for d/c prednisone  2. If needed the patient can be transported from SNF to see GI if she is having probloems with her colitis.

## 2011-04-08 NOTE — Telephone Encounter (Signed)
Order faxed, Left VM for pt's husband

## 2011-04-18 NOTE — H&P (Signed)
NAMEWILEEN, DUNCANSON            ACCOUNT NO.:  [id number]  MEDICAL RECORD NO.:  [id number]  LOCATION:  [facility]                         FACILITY:  [facility]  PHYSICIAN:  Clydia Llano, MD       DATE OF BIRTH:  05/27/1941  DATE OF ADMISSION:  03/15/2011 DATE OF DISCHARGE:                             HISTORY & PHYSICAL   PRIMARY CARE PHYSICIAN:  Rosalyn Gess. Norins, MD  REASON FOR ADMISSION: 1. Right hip fracture. 2. Urinary tract infection. 3. Hypokalemia. 4. Hyponatremia.  HISTORY OF PRESENT ILLNESS:  Ms. Soza is a 70 year old female with past medical history of hypertension, dementia, and dyslipidemia.  The patient brought to the hospital after she sustained a fall this morning. The patient did have some urinary urgency this morning at about 4:30 and she was trying to reach the bathroom.  As per the patient, she has tried to hurry and when she was making a turn, she fell and she did not get up.  Then, husband heard the fall, he came in, he found her a little bit lethargic.  Called EMS and the patient brought to the hospital.  The patient denies any fever, any chills.  Denies any shortness of breath, any chest pain.  Denies any prodrome symptoms.  Per husband, the patient's balance was off since the night before admission.  Upon initial evaluation in the emergency department, the patient was drowsy, but not confused.  CT scan of the head has been negative.  X-ray of the right hip showed right hip fracture.  The patient will be admitted for further evaluation.  PAST MEDICAL HISTORY: 1. Hypertension. 2. Hyperlipidemia. 3. Hypothyroidism. 4. History of atrial fibrillation, rate controlled. 5. History of bloody diarrhea, likely secondary to ulcerative colitis     versus C. diff. 6. History of seizure disorder. 7. History of breast cancer. 8. History of nondisplaced inferior pubic rami fracture in March 2012.  PAST SURGICAL HISTORY: 1. Craniotomy for evacuation of subdural  hematoma in 1992. 2. Left breast lumpectomy followed by radiation in 2007. 3. Right breast mastectomy with node dissection in 1999. 4. Left carotid endarterectomy in 2000. 5. 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 6. Bilateral cataracts extraction.  SOCIAL HISTORY:  Lives at home with husband.  Occasional alcohol, ex- smoker.  No history of drug abuse.  The patient is retired.  FAMILY HISTORY:  Breast cancer and diabetes in her sisters.  Heart disease in mother.  ALLERGIES:  NKDA.  REVIEW OF SYSTEMS:  GENERAL:  The patient denies fever, chills, sweats. HEENT:  Denies headaches, nasal discharge, bleed.  SKIN:  Denies rash, lesions.  CARDIOLOGIC:  Denies chest pain, palpitations.  PULMONARY: Denies shortness of breath, wheezing.  GU:  The patient has urgency, but denies burning while passing urine.  NEUROLOGIC:  Denies weakness, numbness, or disturbance.  MUSCULOSKELETAL:  Denies arthralgia, joint swelling.  Right hip pain.  GI:  Denies nausea, vomiting, diarrhea. ENDOCRINE:  Denies polyuria, polydipsia.  PHYSICAL EXAMINATION:  VITALS:  Temperature 97.7, respirations 13, pulse is 61, blood pressure is 121/53, O2 sats is 100%. GENERAL:  The patient well-developed, elderly Caucasian female,  looks very uncomfortable in no acute distress. HEENT:  Pupils equal, reactive to light and accommodation.  Mouth without oral thrush or lesions.  ENT:  Normal. NECK:  Without meningeal signs.  No JVD appreciated. LUNGS:  Clear to auscultation bilaterally. CARDIOVASCULAR:  Regular rate and rhythm.  No murmurs, rubs, or gallops. S1, S2 heard clearly. GU:  No CVA tenderness. ABDOMEN:  Bowel sounds heard.  Soft, nontender, distended. EXTREMITIES:  There is lateral rotation of the right foot consistent with the history of acute right hip fracture. SKIN:  No rash or lesions. NEUROLOGIC:  Alert, drowsy, easy to arouse,  oriented x3.  Cranial nerves II through XII grossly intact.  Sensation normal.  The patient's strength and gait was not tested because of the patient in pain.  RADIOLOGY:  A CT C-spine showed multiple cervical spondylosis without fracture or dislocation, degenerative anterolisthesis associated with facet arthrosis, postsurgical changes of the neck.  CT scan of the head showed no acute intracranial abnormality.  There is bilateral left greater than right encephalomalacia with ex vacuo dilatation of the lateral ventricles, also left greater than right.  Left craniotomy with calvarial reconstruction.  Right hip x-ray showed a comminuted and significantly displaced intertrochanteric fracture through the proximal right femur with medial displaced lesser trochanteric fragment and mild medial angulation of the fracture site.  There is widening of the left sacroiliac joint, thought to be chronic in nature given healed left- sided pubic rami fracture.  Please suggest clinical correlation for evidence of pelvic instability.  Chest x-ray, one view, no displaced rib fractures seen.  No acute cardiopulmonary processes are identified.  LABORATORY DATA: 1. BMP; sodium 120, potassium 2.4, chloride 76, bicarb 38, glucose     128, BUN 7, creatinine 0.6. 2. CBC; WBCs 8.2, hemoglobin 11.4, hematocrit 32.7, platelets 176. 3. Urinalysis showed large leukocyte esterase and too-numerous-to-     count pus cells.  ASSESSMENT AND PLAN: 1. Right intertrochanteric hip fracture.  The patient had acute     fracture secondary to fall.  The Orthopedic Service on call was     contacted by primary care physician to evaluate the patient.  The     patient's PT/INR and platelets will be obtained before surgery. 2. Hyponatremia.  The BMP showed hyponatremia, hypokalemia, and high     bicarb.  These are consistent with use of diuretic causes sodium     and potassium loss as well as contracture alkalosis.  The patient      has been recently started on hydrochlorothiazide.  I will stop that     and hydrate the patient carefully, the target is about 10-12 mEq     change in sodium in over 24 hours.  The sodium supplementation     through normal saline will be at 80 mL.  We will check the BMP     every 8 hours to make sure it is not rapidly corrected. 3. Hypokalemia.  This is likely secondary to the diuretic.  We will     replete the potassium aggressively. 4. Urinary tract infection.  The patient will be started on Rocephin.     Please note that the patient has history of Clostridium difficile     and ulcerative colitis.  The patient will need the least effective     period of antibiotic.  Whenever the cultures come back, the patient     can be switched to Bactrim if it is sensitive, causes related to     lower C.  diff rates. 5. History of ulcerative colitis, stable.  We will continue her home     medications. 6. History of prednisone use according to the husband, not too     prolong, probably over the past 2 months.  If the patient's blood     pressure goes down, she might need a stress dose of his steroids.  I will admit the patient to step-down unit because of the drowsiness, which might be secondary to the neurological sequela of the low sodium.     Clydia Llano, MD     ME/MEDQ  D:  03/15/2011  T:  03/15/2011  Job:  [id number]  cc:   Rosalyn Gess. Norins, MD  Electronically Signed by Clydia Llano  on 04/18/2011 01:49:37 PM

## 2011-04-21 NOTE — Telephone Encounter (Signed)
See other note

## 2011-04-22 ENCOUNTER — Other Ambulatory Visit: Payer: Self-pay | Admitting: Internal Medicine

## 2011-04-22 ENCOUNTER — Telehealth: Payer: Self-pay | Admitting: Gastroenterology

## 2011-04-25 NOTE — Telephone Encounter (Signed)
Pt's last OV 03/01/11 following Hospitalization for UC flare following tx for CDIFF. COLON by Dr [name] 02/20/11 confirming severe Colitis most consistent with IBD; path revealed Chronic Colitis. Per OV notes on 03/01/11, pt was to decrease prednisone to 30mg /day x 2 weeks then 20mg /day for 2 weeks and F/U with Dr Jarold Motto in 1 month. She was also to decrease Asacol 1.2g to tid, finish what Asacol she had, then start Lialda 1.2g ,2 tabs qam.  Pt fell and broke her hip about the time she was due for her 4 week f/u. She was in rehab for 1 month and came home last week. Pt's husband wants to know if she is to continue Asacol or Lialda? Pt is off the Prednisone. Dr [name], Dr Jarold Motto is on vacation, can you advise since you did pt's COLON? Thanks.

## 2011-04-25 NOTE — Telephone Encounter (Signed)
Notified pt's husband Dr [name] instructed pt to remain on Lialda 2 tabs qam for remission mtce for UC; f/u for Dr Jarold Motto 05/13/11 at 11:30am.

## 2011-04-25 NOTE — Telephone Encounter (Signed)
Continue Lialda.  This will be used for maintenance of remission for her UC. Thanks

## 2011-04-27 ENCOUNTER — Telehealth: Payer: Self-pay | Admitting: *Deleted

## 2011-04-27 NOTE — Telephone Encounter (Signed)
Pt's husband left VM - Wants to know when pt needs hospital f/u?

## 2011-04-27 NOTE — Telephone Encounter (Signed)
Please call husband and set up hospital f/u next week with Dr Debby Bud, [facility]!

## 2011-04-27 NOTE — Telephone Encounter (Signed)
Now that she is home from SNF an appointment next week

## 2011-04-28 ENCOUNTER — Telehealth: Payer: Self-pay | Admitting: Gastroenterology

## 2011-04-29 MED ORDER — ESOMEPRAZOLE MAGNESIUM 40 MG PO CPDR: DELAYED_RELEASE_CAPSULE | ORAL | Status: DC

## 2011-04-29 NOTE — Telephone Encounter (Signed)
Needs office visit to sort this out this week-extender or Dr. [name] have any appts?

## 2011-04-29 NOTE — Telephone Encounter (Signed)
Notified husband we probably need to see the pt per Dr Russella Dar. Scheduled pt to see Dr Jarold Motto next Tuesday, 05/03/11. Also, she probably needs to be on Nexium since she is and has been more sedentary. Left samples at the front desk in case Dr Jarold Motto doesn't want to renew the drug; husband stated understanding.

## 2011-04-29 NOTE — Telephone Encounter (Signed)
SPOKE WITH PATIENT AND SET UP AN APPT FOR FRI NOV 2 AT 3:30.  SHE WILL HAVE HER HUSBAND CALL TO CONFIRM

## 2011-04-29 NOTE — Telephone Encounter (Signed)
Pt's last OV 03/01/11 following Hospitalization for UC flare following tx for CDIFF. COLON by Dr [name] 02/20/11 confirming severe Colitis most consistent with IBD; path revealed Chronic Colitis.  Per OV notes on 03/01/11, pt was to decrease prednisone to 30mg /day x 2 weeks then 20mg /day for 2 weeks and F/U with Dr Jarold Motto in 1 month. She was also to decrease Asacol 1.2g to tid, finish what Asacol she had, then start Lialda 1.2g ,2 tabs qam.  Pt fell and broke her hip about the time she was due for her 4 week f/u. She was in rehab for 1 month and came home last week. Pt's husband called on 04/25/11 about continuing the Asacol or Lialda and Dr [name] stated yes. Pt is off the Prednisone. Today, husband called to report pt has had BRB in her stools the past 3 days. The rehab center, [facility] had also reported blood in her stool on occasion but thought it was hemorrhoids. Her stools are soft and semi formed and she has no diarrhea or pain per husband; her appetite is good. During the hospitalization to rehab to home, pt's Nexium was stopped. She takes Lialda 3 tabs tid. Pt has a f/u with Dr Jarold Motto on 05/13/11. Please advise about the bleeding. Thanks.

## 2011-05-03 ENCOUNTER — Encounter: Payer: Self-pay | Admitting: Gastroenterology

## 2011-05-03 ENCOUNTER — Ambulatory Visit (INDEPENDENT_AMBULATORY_CARE_PROVIDER_SITE_OTHER): Payer: Medicare HMO | Admitting: Gastroenterology

## 2011-05-03 VITALS — BP 118/64 | HR 80 | Ht 66.0 in | Wt 132.0 lb

## 2011-05-03 DIAGNOSIS — K625 Hemorrhage of anus and rectum: Secondary | ICD-10-CM

## 2011-05-03 DIAGNOSIS — A498 Other bacterial infections of unspecified site: Secondary | ICD-10-CM

## 2011-05-03 MED ORDER — MESALAMINE 400 MG PO TBEC: 1200.0000 mg | DELAYED_RELEASE_TABLET | Freq: Three times a day (TID) | ORAL | Status: DC

## 2011-05-03 NOTE — Progress Notes (Signed)
This is a very complex 70 year old Caucasian female who has had Multiple hospitalizations over the last several months mostly on the care of Dr. Shela Commons. [name]. I cannot decipher from reviewing her multiple records what actually has occurred over the last several months in terms of her chronic diarrhea, whether or not she has had mostly C. difficile infection or inflammatory bowel disease. She has been on a variety of medications including corticosteroids, vancomycin, amino salicylates, etc., etc., she apparently had massive dehydration and electrolyte imbalance during her hospitalization, and is supposedly under the primary care of Dr. Debby Bud .. she apparently has been in a extended care facility for the last month, and was referred to me today per Dr. Russella Dar because per an episode of bright red rectal bleeding last week. She currently is on large doses of Asacol and denies any significant GI complaints otherwise. Her medications are listed and reviewed.   Current Medications, Allergies, Past Medical History, Past Surgical History, Family History and Social History were reviewed in [employer] record.    Physical Exam: Chronically ill-appearing white female appearing older than her stated age in no acute distress. She actually is fairly mobile but is wheelchair-bound. Abdominal exam is unremarkable. Suspect the rectum is unremarkable as is rectal exam. There is soft normal color stool which is guaiac positive. Mental status is normal.    Assessment and Plan: I am completely confused as to the etiology of this patient's gastrointestinal illness over the last several months. Would be most prudent for her to followup with Dr. Illene Regulus per her multiple electrolyte imbalances, multifactorial anemia, and have someone to review all of her problems, labs, meds, and come up with a plan of action. I am going to change her medical care GI wise to Dr. [name] who has been her recurrent GI  physician over the last several months. Repeat stool exam for C. difficile toxin has been requested. For now we will continue Asacol at current doses. Please copy this note to Dr. Debby Bud and Dr. [name]. No diagnosis found.

## 2011-05-03 NOTE — Patient Instructions (Addendum)
You will need to keep your follow up appointment with Dr Debby Bud Cc. Dr Debby Bud You will need to go to the basement today for your C-diff test We have sent a 90 days supple of Asacol to [facility]

## 2011-05-04 ENCOUNTER — Encounter: Payer: Self-pay | Admitting: Gastroenterology

## 2011-05-04 ENCOUNTER — Other Ambulatory Visit: Payer: Medicare HMO

## 2011-05-04 DIAGNOSIS — A498 Other bacterial infections of unspecified site: Secondary | ICD-10-CM

## 2011-05-06 ENCOUNTER — Ambulatory Visit (INDEPENDENT_AMBULATORY_CARE_PROVIDER_SITE_OTHER): Payer: Medicare HMO | Admitting: Internal Medicine

## 2011-05-06 ENCOUNTER — Other Ambulatory Visit (INDEPENDENT_AMBULATORY_CARE_PROVIDER_SITE_OTHER): Payer: Medicare HMO

## 2011-05-06 ENCOUNTER — Encounter: Payer: Self-pay | Admitting: Internal Medicine

## 2011-05-06 DIAGNOSIS — D509 Iron deficiency anemia, unspecified: Secondary | ICD-10-CM

## 2011-05-06 DIAGNOSIS — R739 Hyperglycemia, unspecified: Secondary | ICD-10-CM

## 2011-05-06 DIAGNOSIS — I10 Essential (primary) hypertension: Secondary | ICD-10-CM

## 2011-05-06 DIAGNOSIS — Z79899 Other long term (current) drug therapy: Secondary | ICD-10-CM

## 2011-05-06 DIAGNOSIS — K5190 Ulcerative colitis, unspecified, without complications: Secondary | ICD-10-CM

## 2011-05-06 DIAGNOSIS — K529 Noninfective gastroenteritis and colitis, unspecified: Secondary | ICD-10-CM

## 2011-05-06 DIAGNOSIS — R7309 Other abnormal glucose: Secondary | ICD-10-CM

## 2011-05-06 DIAGNOSIS — E785 Hyperlipidemia, unspecified: Secondary | ICD-10-CM

## 2011-05-06 DIAGNOSIS — R197 Diarrhea, unspecified: Secondary | ICD-10-CM

## 2011-05-06 DIAGNOSIS — E039 Hypothyroidism, unspecified: Secondary | ICD-10-CM

## 2011-05-06 DIAGNOSIS — K5289 Other specified noninfective gastroenteritis and colitis: Secondary | ICD-10-CM

## 2011-05-06 LAB — CBC WITH DIFFERENTIAL/PLATELET
Basophils Absolute: 0 10*3/uL (ref 0.0–0.1)
HCT: 39 % (ref 36.0–46.0)
Hemoglobin: 12.9 g/dL (ref 12.0–15.0)
Lymphs Abs: 1.2 10*3/uL (ref 0.7–4.0)
MCHC: 33.2 g/dL (ref 30.0–36.0)
MCV: 94.1 fl (ref 78.0–100.0)
Monocytes Absolute: 0.7 10*3/uL (ref 0.1–1.0)
Neutro Abs: 3.9 10*3/uL (ref 1.4–7.7)
Platelets: 198 10*3/uL (ref 150.0–400.0)
RDW: 20.2 % — ABNORMAL HIGH (ref 11.5–14.6)

## 2011-05-06 LAB — IBC PANEL: Iron: 43 ug/dL (ref 42–145)

## 2011-05-06 LAB — COMPREHENSIVE METABOLIC PANEL
ALT: 24 U/L (ref 0–35)
AST: 24 U/L (ref 0–37)
Albumin: 4.1 g/dL (ref 3.5–5.2)
Alkaline Phosphatase: 205 U/L — ABNORMAL HIGH (ref 39–117)
BUN: 10 mg/dL (ref 6–23)
Calcium: 9.4 mg/dL (ref 8.4–10.5)
Chloride: 94 mEq/L — ABNORMAL LOW (ref 96–112)
Creatinine, Ser: 0.7 mg/dL (ref 0.4–1.2)
Potassium: 4.7 mEq/L (ref 3.5–5.1)

## 2011-05-06 LAB — LIPID PANEL
Cholesterol: 173 mg/dL (ref 0–200)
LDL Cholesterol: 70 mg/dL (ref 0–99)
Total CHOL/HDL Ratio: 2
VLDL: 6.6 mg/dL (ref 0.0–40.0)

## 2011-05-06 LAB — HEMOGLOBIN A1C: Hgb A1c MFr Bld: 5.5 % (ref 4.6–6.5)

## 2011-05-06 LAB — CLOSTRIDIUM DIFFICILE EIA: CDIFTX: NEGATIVE

## 2011-05-06 NOTE — Progress Notes (Signed)
Subjective:    Patient ID: [name], female    DOB: 1941/02/25, 70 y.o.   MRN: [id number]  HPI Mrs. Stillman had a hip fracture right that required ORIF (screw and plate) September 11th. During that stay she had c.diff colitits, hypokalemia, hyponatremia and iron deficiency anemia (reviewed D/C summary and labs). She was discharged to [facility] Sept 19th for a month of rehab. She did well and has returned home two weeks ago. She is continuing to have in -home PT and is for two more weeks. She is ambulating with the use of a rolling walker.   She has seen Dr. Jarold Motto for her colitis, note reviewed, and he is transferring her care to Dr. [name]. A stool specimen for C. Diff is pending. She has continued problem with blood in the stool. She continues on asacol. She did receive IV iron while in hospital and has been getting B12 replacement IM.  She recently saw Dr. Turner Daniels, orthopedics, for follow-up who is satisfied with her progress.   Past Medical History  Diagnosis Date  . Peripheral vascular disease   . Subdural hematoma   . Ataxia   . Mild dysplasia of cervix   . UTI (lower urinary tract infection)   . Urinary incontinence   . Seizures   . Cerebrovascular disease   . Hypothyroidism   . Personal history of colonic polyps     adenomatous 20-Nov-1995 & tubular adenoma and hyplastic  20-Nov-2006  . Hyperlipidemia   . Adenocarcinoma, breast     bilateral  . H/O alcohol abuse   . Diverticulosis of colon (without mention of hemorrhage)   . Redundant colon   . Squamous cell carcinoma of mouth   . Stroke   . C. difficile colitis   . Dementia t  . Ulcerative colitis    Past Surgical History  Procedure Date  . Subdural hematoma evacuation via craniotomy   . 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 Multiple tooth extractions     due to oral cancer  . Cataract extraction, bilateral   . Breast lumpectomy     left breast with radiation therapy  . Carotid endarterectomy       left  . Mastectomy     Right, history with nodule dissection   Family History  Problem Relation Age of Onset  . Coronary artery disease Mother   . Heart disease Mother   . Diabetes Sister   . Cancer     History   Social History  . Marital Status: Married    Spouse Name: N/A    Number of Children: 2  . Years of Education: N/A   Occupational History  . retired     Sales executive   Social History Main Topics  . Smoking status: Former Games developer  . Smokeless tobacco: Never Used  . Alcohol Use: 4.2 oz/week    7 Glasses of wine per week  . Drug Use: No  . Sexually Active: Not Currently   Other Topics Concern  . Not on file   Social History Narrative   HSG. Married x 7 years divorced; married 2067/11/20. 1 son- died as a neonate, 1 son- 11/20/58. Retired- worked as a Sales executive.   End of life: has a living will- does not want futile/ heroic care; no cpr.  Marriage- reports marriage is in good health (4/10)       Review of Systems System review is negative for any constitutional, cardiac, pulmonary,  GI or neuro symptoms or complaints other than as described in the HPI.     Objective:   Physical Exam virtals - stable Gen'l- wnwd white woman in no distress, a bit dissheveled.  Chest- CTAP Cor - RRR       Assessment & Plan:

## 2011-05-08 NOTE — Assessment & Plan Note (Signed)
She has seen Dr. Jarold Motto and is on asacol. She has been transferred to Dr. Lauro Franklin care for GI care for her colitis.   Plan - for continued hematochezia will encourage a follow-up GI consult in the near future.

## 2011-05-08 NOTE — Assessment & Plan Note (Signed)
She has seen Dr. Patterson and is on asacol. She has been transferred to Dr. Pyrtle's care for GI care for her colitis.   Plan - for continued hematochezia will encourage a follow-up GI consult in the near future. 

## 2011-05-08 NOTE — Assessment & Plan Note (Signed)
Hgb is low but stable and improved for nadir. Last lab revealed iron deficiency. She is not symptomatic: no chest pain, SOB, tachycardia. Currently not taking an iron supplement   Plan - labs ordered: iron panel, H/H           Continue an iron rich diet.

## 2011-05-08 NOTE — Assessment & Plan Note (Signed)
BP Readings from Last 3 Encounters:  05/06/11 120/70  05/03/11 118/64  03/04/11 150/80   Good control on present medications

## 2011-05-09 LAB — VITAMIN B12: Vitamin B-12: 424 pg/mL (ref 211–911)

## 2011-05-10 ENCOUNTER — Other Ambulatory Visit: Payer: Self-pay | Admitting: Dermatology

## 2011-05-13 ENCOUNTER — Ambulatory Visit: Payer: Medicare HMO | Admitting: Gastroenterology

## 2011-05-16 ENCOUNTER — Encounter: Payer: Self-pay | Admitting: Internal Medicine

## 2011-05-17 ENCOUNTER — Telehealth: Payer: Self-pay | Admitting: Gastroenterology

## 2011-05-17 ENCOUNTER — Telehealth: Payer: Self-pay | Admitting: *Deleted

## 2011-05-17 DIAGNOSIS — K5190 Ulcerative colitis, unspecified, without complications: Secondary | ICD-10-CM

## 2011-05-17 DIAGNOSIS — K625 Hemorrhage of anus and rectum: Secondary | ICD-10-CM

## 2011-05-17 NOTE — Telephone Encounter (Signed)
Pt with hx of CDIFF x 3 times; hx of severe UC. Pt seen here on 05/03/11 for rectal bleeding

## 2011-05-17 NOTE — Telephone Encounter (Signed)
Pt's husband reports the bleeding is the main problem now.; she does have loose stools, but not diarrhea. Pt reports the problem is bleeding and lower abdominal cramping. Pt report she can go to the bathroom and is she hears a "bubble" she knows she is going to pass blood; sometimes she doesn't make it to the BR.  I did order the stool tests until after you saw this report.  Please advise.

## 2011-05-17 NOTE — Telephone Encounter (Signed)
Labs were mailed to pt today with results

## 2011-05-17 NOTE — Telephone Encounter (Signed)
Caller requesting results of labs done 11.02.12 [concern over previous rectal bleeding].

## 2011-05-18 ENCOUNTER — Telehealth: Payer: Self-pay | Admitting: *Deleted

## 2011-05-18 DIAGNOSIS — R197 Diarrhea, unspecified: Secondary | ICD-10-CM

## 2011-05-18 DIAGNOSIS — K5190 Ulcerative colitis, unspecified, without complications: Secondary | ICD-10-CM

## 2011-05-18 DIAGNOSIS — K625 Hemorrhage of anus and rectum: Secondary | ICD-10-CM

## 2011-05-18 NOTE — Telephone Encounter (Signed)
Spoke with husband to inform him Dr [name] has decided he will do the stool studies to r/o CDIFF. If not CDIFF, he would like to do a FLEX SIG to confirm UC. If UC, he may need to change meds such as Remicade, but he doesn't know if she will do well d/t her Dementia. Pt doesn't do well with prednisone, so that's not an option; husband agreed.  Pt's husband will have wife do stool sample and then we will determine if pt needs a Flex Sig Dr Lauro Franklin during Dr Lauro Franklin next hospital week. Pt's husband reports they are in the donut hole and someone gave them unopened Asacol 800 mg; can we switch her back when they run out of lialda? Husband will call.

## 2011-05-18 NOTE — Telephone Encounter (Signed)
Will recheck labs now, CBC and comp metabolic panel Will rule out infectious etiology by performing stool studies including C. difficile (given her history)  If negative for infection, we'll likely proceed to flexible sigmoidoscopy to see if her dealing with UC flare in the setting of 5-ASA meds If she is flaring, then we'll need to discuss further therapy including immunomodulators or Biologics

## 2011-05-18 NOTE — Telephone Encounter (Signed)
Notified pt's husband when he brings the stool samples back, please have pt come for a CBC and CMET.

## 2011-05-19 ENCOUNTER — Other Ambulatory Visit (INDEPENDENT_AMBULATORY_CARE_PROVIDER_SITE_OTHER): Payer: Medicare HMO

## 2011-05-19 ENCOUNTER — Other Ambulatory Visit: Payer: Medicare HMO

## 2011-05-19 DIAGNOSIS — K5190 Ulcerative colitis, unspecified, without complications: Secondary | ICD-10-CM

## 2011-05-19 DIAGNOSIS — K625 Hemorrhage of anus and rectum: Secondary | ICD-10-CM

## 2011-05-19 DIAGNOSIS — R197 Diarrhea, unspecified: Secondary | ICD-10-CM

## 2011-05-19 LAB — COMPREHENSIVE METABOLIC PANEL
ALT: 22 U/L (ref 0–35)
AST: 19 U/L (ref 0–37)
Albumin: 3.6 g/dL (ref 3.5–5.2)
CO2: 30 mEq/L (ref 19–32)
Calcium: 9 mg/dL (ref 8.4–10.5)
Chloride: 97 mEq/L (ref 96–112)
Potassium: 3.7 mEq/L (ref 3.5–5.1)
Sodium: 137 mEq/L (ref 135–145)
Total Protein: 6.6 g/dL (ref 6.0–8.3)

## 2011-05-19 LAB — CBC WITH DIFFERENTIAL/PLATELET
Basophils Absolute: 0 10*3/uL (ref 0.0–0.1)
Eosinophils Absolute: 0.3 10*3/uL (ref 0.0–0.7)
Lymphocytes Relative: 13.6 % (ref 12.0–46.0)
MCHC: 33.7 g/dL (ref 30.0–36.0)
Monocytes Relative: 8.9 % (ref 3.0–12.0)
Neutrophils Relative %: 73.6 % (ref 43.0–77.0)
RDW: 19.4 % — ABNORMAL HIGH (ref 11.5–14.6)

## 2011-05-20 ENCOUNTER — Telehealth: Payer: Self-pay | Admitting: *Deleted

## 2011-05-20 LAB — OVA AND PARASITE SCREEN: OP: NONE SEEN

## 2011-05-20 LAB — CLOSTRIDIUM DIFFICILE BY PCR: Toxigenic C. Difficile by PCR: DETECTED — CR

## 2011-05-20 MED ORDER — VANCOMYCIN HCL 250 MG PO CAPS: ORAL_CAPSULE | ORAL | Status: DC

## 2011-05-20 NOTE — Telephone Encounter (Signed)
Received a verbal from the lab that pt was positive for CDIFF. Per Dr [name], VANCOMYCIN 250mg  po 4 times daily for 14days. Called [facility] and [facility] and there was no one to compound. At [facility], pt will have to pay $264.29 and then they will file the insurance. Husband preferred to go to [facility] and they try to have it compounded tomorrow. Pt's husband informed and he reports pt is still bleeding BRB. Explained to him he will have to use his judgement as far as calling or taking her to the ER. Dr [name] aware and he states may need flex next week.

## 2011-05-23 LAB — STOOL CULTURE

## 2011-05-24 ENCOUNTER — Telehealth: Payer: Self-pay | Admitting: *Deleted

## 2011-05-24 MED ORDER — MESALAMINE 400 MG PO TBEC: DELAYED_RELEASE_TABLET | ORAL | Status: DC

## 2011-05-24 MED ORDER — PREDNISONE 5 MG PO TABS: ORAL_TABLET | ORAL | Status: DC

## 2011-05-24 NOTE — Telephone Encounter (Signed)
Spoke with husband to inform him pt will have to go on a prednisone taper for the UC flare. Pt will start:  *Asacol 1600mg  three times daily for a total of 4.8 grams daily  *Prednisone taper, 30mg  daily for 1 week if symptoms improve; if not, she needs to take 30mg  for 2 weeks  *After 1 or 2 weeks, decrease the Prednisone by 5mg  a week until completed; like 25 mg x 1 week , then 20mg  x 1 week, etc.

## 2011-05-24 NOTE — Telephone Encounter (Deleted)
Spoke with husband to inform him pt will have to go on a prednisone taper for the UC flare. Pt will start:  *Asacol 1600mg  three times daily for a total of 4.8 grams daily  *Prednisone taper, 30mg  daily for 1 week if symptoms improve; if not, she needs to take 30mg  for 2 weeks  *After 1 or 2 weeks, decrease the Prednisone by 5mg  a week until completed; like 25 mg x 1 week , then 20mg  x 1 week, etc. Mr Riga stated understanding.

## 2011-05-24 NOTE — Telephone Encounter (Signed)
Pt's husband reports pt was able to start Vanc on 05/20/11 at midnite. He reports the diarrhea is better, and the bleeding is a little better, but pt continues to pass BRB and clots and has urgency. Spoke with Dr [name] who ordered an increase in Asacol 400mg  or 1200mg  tid to 1600mg  tid. The only other option is a prednisone taper, but he is hesitant to do it.

## 2011-05-30 ENCOUNTER — Telehealth: Payer: Self-pay | Admitting: *Deleted

## 2011-05-30 NOTE — Telephone Encounter (Signed)
Called to check on pt and husband reports pt is still bleeding. She has been on Vanc for 1 week and started the Prednisone and increased Asacol as directed. Dr [name], you have openings in [facility] Friday, do you want to schedule a Flex Sig or other orders? Thanks.

## 2011-05-31 NOTE — Telephone Encounter (Signed)
Yes, flex sig this week.  2 enemas for prep. We will use this to determine activity of colitis, as I would have expected the c diff associated diarrhea to have improved/be improving at this pt.

## 2011-05-31 NOTE — Telephone Encounter (Signed)
Yes, let's keep the flex sig appt for now and recheck in with her on Thursday. If improving still we may cancel the flex sig.

## 2011-05-31 NOTE — Telephone Encounter (Signed)
Informed pt that I will call her Thursday to check on her condition; pt stated understanding.

## 2011-05-31 NOTE — Telephone Encounter (Signed)
Informed pt and her husband of Dr [name] doing a FLEX SIG on Friday, 06/03/11. Husband reports less BRB in the bedside commode this am, but BRB is on the tissue when she wipes. He also thinks her stools are not as loose and may be forming. They are willing to do whatever Dr [name] suggests. Will call them back after speaking with Dr [name]. Dr [name], ok to keep the Flex Sig appt and see how pt does up to Thursday when she would start clear liquids?

## 2011-06-02 ENCOUNTER — Telehealth: Payer: Self-pay | Admitting: Internal Medicine

## 2011-06-02 NOTE — Telephone Encounter (Signed)
Husband reports pt got worse yesterday and her bleeding and diarrhea has increased. Per Dr [name], keep the appt for Flex Sig in am. Mr Schou will come by this am for prep instructions.

## 2011-06-03 ENCOUNTER — Encounter: Payer: Self-pay | Admitting: Internal Medicine

## 2011-06-03 ENCOUNTER — Telehealth: Payer: Self-pay | Admitting: *Deleted

## 2011-06-03 ENCOUNTER — Other Ambulatory Visit: Payer: Self-pay | Admitting: Internal Medicine

## 2011-06-03 ENCOUNTER — Ambulatory Visit (AMBULATORY_SURGERY_CENTER): Payer: Medicare HMO | Admitting: Internal Medicine

## 2011-06-03 ENCOUNTER — Other Ambulatory Visit: Payer: Medicare HMO

## 2011-06-03 ENCOUNTER — Other Ambulatory Visit (INDEPENDENT_AMBULATORY_CARE_PROVIDER_SITE_OTHER): Payer: Medicare HMO

## 2011-06-03 VITALS — BP 100/59 | HR 99 | Temp 98.0°F | Resp 25 | Ht 66.0 in | Wt 132.0 lb

## 2011-06-03 DIAGNOSIS — K5289 Other specified noninfective gastroenteritis and colitis: Secondary | ICD-10-CM

## 2011-06-03 DIAGNOSIS — K529 Noninfective gastroenteritis and colitis, unspecified: Secondary | ICD-10-CM

## 2011-06-03 DIAGNOSIS — K5190 Ulcerative colitis, unspecified, without complications: Secondary | ICD-10-CM

## 2011-06-03 LAB — CBC WITH DIFFERENTIAL/PLATELET
Basophils Absolute: 0 10*3/uL (ref 0.0–0.1)
Eosinophils Relative: 0 % (ref 0.0–5.0)
HCT: 39 % (ref 36.0–46.0)
Hemoglobin: 13.1 g/dL (ref 12.0–15.0)
Lymphocytes Relative: 10.1 % — ABNORMAL LOW (ref 12.0–46.0)
Lymphs Abs: 1.6 10*3/uL (ref 0.7–4.0)
Monocytes Relative: 6.7 % (ref 3.0–12.0)
Platelets: 225 10*3/uL (ref 150.0–400.0)
WBC: 15.7 10*3/uL — ABNORMAL HIGH (ref 4.5–10.5)

## 2011-06-03 LAB — BASIC METABOLIC PANEL
CO2: 33 mEq/L — ABNORMAL HIGH (ref 19–32)
Calcium: 9.2 mg/dL (ref 8.4–10.5)
Chloride: 98 mEq/L (ref 96–112)
Glucose, Bld: 117 mg/dL — ABNORMAL HIGH (ref 70–99)
Sodium: 140 mEq/L (ref 135–145)

## 2011-06-03 MED ORDER — DISPOSABLE ENEMA 19-7 GM/118ML RE ENEM: 1.0000 | ENEMA | Freq: Once | RECTAL | Status: DC

## 2011-06-03 MED ORDER — SODIUM CHLORIDE 0.9 % IV SOLN: 500.0000 mL | INTRAVENOUS | Status: DC

## 2011-06-03 MED ORDER — PREDNISONE 10 MG PO TABS: ORAL_TABLET | ORAL | Status: DC

## 2011-06-03 NOTE — Progress Notes (Signed)
Pt states she did not do the enema at home that was prescribed with her prep. Pt has recent Right hip surgery and left rib fractures.  Informed Dr [name] and he gave instructions to go ahead and give her enema now.  Pt given enema and held it for about 15 mins.  Result from enema are brown liquid. Tolerated enema well.

## 2011-06-03 NOTE — Telephone Encounter (Signed)
Informed husband that we scheduled pt for her 1st Remicade at [facility] on 06/13/11. Orders are in the computer, but I may have to reenter the 2, 6, 8 week infusions. I will mail him a note about the regime. Dr [name], pt wants to know if pt needs to remain on ASACOL and Prednisone? Thanks.

## 2011-06-03 NOTE — Progress Notes (Signed)
Stool specimen obtained for c.diff.  Pathology specimen also to r/o ulcerative colitis. maw

## 2011-06-03 NOTE — Progress Notes (Signed)
Patient did not experience any of the following events: a burn prior to discharge; a fall within the facility; wrong site/side/patient/procedure/implant event; or a hospital transfer or hospital admission upon discharge from the facility. (G8907) Patient did not have preoperative order for IV antibiotic SSI prophylaxis. (G8918)  

## 2011-06-03 NOTE — Telephone Encounter (Signed)
Message copied by Florene Glen on Fri Jun 03, 2011  4:50 PM ------      Message from: Beverley Fiedler      Created: Fri Jun 03, 2011  4:29 PM       White count is elevated again today.  Red cell count remains okay despite her bleeding associated with her colitis.      Will await repeat c diff testing.  May need to lengthen the course.      Lytes are okay.      Please reiterate to her husband that if she worsens or he becomes more concerned with her at home, he should bring her to the ED where she can be admitted and we/GI will follow/help.      Thanks

## 2011-06-03 NOTE — Patient Instructions (Signed)
Discharge instructions given with verbal understanding. Continue mesalamine and prednisone. Labs today. CBC BMP HEP B /C serologies. Needs PPD skin testing.

## 2011-06-03 NOTE — Telephone Encounter (Signed)
White count is elevated again today. Red cell count remains okay despite her bleeding associated with her colitis. Will await repeat c diff testing. May need to lengthen the course. Lytes are okay. Please reiterate to her husband that if she worsens or he becomes more concerned with her at home, he should bring her to the ED where she can be admitted and we/GI will follow/help. Thanks  Notified husband of Dr Lauro Franklin finds and recommendations; he stated understanding.  Dr [name], neither place- [facility] or [facility] could take pt next; you can have [facility] or [facility] try next week.

## 2011-06-03 NOTE — Progress Notes (Signed)
Pt was carefully rolled to her left side by nurse and tech using the pads.  Pt tolerated it well. Maw  Pt tolerated the flex sig well. maw

## 2011-06-03 NOTE — Telephone Encounter (Signed)
Yes, they can't do remicade infusion earlier? Thanks

## 2011-06-04 LAB — HEPATITIS C ANTIBODY: HCV Ab: NEGATIVE

## 2011-06-04 LAB — HEPATITIS B SURFACE ANTIGEN: Hepatitis B Surface Ag: NEGATIVE

## 2011-06-06 ENCOUNTER — Telehealth: Payer: Self-pay | Admitting: Internal Medicine

## 2011-06-06 ENCOUNTER — Telehealth: Payer: Self-pay

## 2011-06-06 NOTE — Telephone Encounter (Signed)
Pt remicade procedure has been explained and all her questions answered

## 2011-06-06 NOTE — Telephone Encounter (Signed)

## 2011-06-08 NOTE — Progress Notes (Signed)
[name] states the results should be available later today or early tomorrow

## 2011-06-13 ENCOUNTER — Telehealth: Payer: Self-pay | Admitting: Internal Medicine

## 2011-06-13 ENCOUNTER — Encounter (HOSPITAL_COMMUNITY)
Admission: RE | Admit: 2011-06-13 | Discharge: 2011-06-13 | Disposition: A | Discharge status: Home / Self Care | Payer: Medicare HMO | Source: Ambulatory Visit | Attending: Internal Medicine | Admitting: Internal Medicine

## 2011-06-13 DIAGNOSIS — K5190 Ulcerative colitis, unspecified, without complications: Secondary | ICD-10-CM | POA: Insufficient documentation

## 2011-06-13 MED ORDER — INFLIXIMAB 100 MG IV SOLR
5.0000 mg/kg | Freq: Once | INTRAVENOUS | Status: AC
  Administered 2011-06-13: 300 mg via INTRAVENOUS
  Filled 2011-06-13: qty 30

## 2011-06-13 NOTE — Telephone Encounter (Signed)
I spoke with [facility] and advised them that the order was put in several times and was also faxed to the short stay dept.  She will research and call back if anything further is needed.

## 2011-06-13 NOTE — Telephone Encounter (Signed)
Left message on machine to call back  

## 2011-06-14 ENCOUNTER — Telehealth: Payer: Self-pay | Admitting: Internal Medicine

## 2011-06-14 NOTE — Telephone Encounter (Signed)
Husband reports pt is doing better; diarrhea is better, but she still has urgency. Pt stated the bleeding isn't as bad either. Pt did well with her 1st dose of Remicade. Pt remains on 30mg  Prednisone daily and Asacol 2- 800mg  tabs tid; should she remain on these meds? You had mentioned decreasing the Prednisone by 5 mg weekly as tolerated to taper. Please advise. Thanks.

## 2011-06-15 ENCOUNTER — Telehealth: Payer: Self-pay | Admitting: *Deleted

## 2011-06-15 MED ORDER — FLUCONAZOLE 100 MG PO TABS: 100.0000 mg | ORAL_TABLET | Freq: Every day | ORAL | Status: AC

## 2011-06-15 NOTE — Telephone Encounter (Signed)
Spoke with Mr Tiggs and he will decrease pt's Prednisone by 5mg /week until off and continue the Asacol. He has some Lialda left and will call for dosing when the Asacol is complete.

## 2011-06-15 NOTE — Telephone Encounter (Signed)
Pt reports a "vaginal infection"; she reports a white discharge and terrible itching. Pt is on prednisone decreasing to 25mg /day this week. She is also on Asacol and started Remicade on Monday. Dr Debby Bud, Dr [name] is gone and there are no other doctors or midlevels here now, can you please order something for the pt tonight? Thanks. Graciella Freer RN

## 2011-06-15 NOTE — Telephone Encounter (Signed)
Called pt. Diflucan eScribed in for her. She reports she is having a lot of bloody diarrhea....ibuprofen will leave that issue to my GI brethern

## 2011-06-15 NOTE — Telephone Encounter (Signed)
Yes, please start to taper pred by 5 mg every week until off.  Remain on mesalamine. Hopefully she will get better symptom control once Remicade has a chance. Thanks

## 2011-06-16 NOTE — Telephone Encounter (Signed)
Thanks Dr Debby Bud! We have been treating pt for UC and the bloody diarrhea for weeks! She has told me and her husband this week her s&s are better and the bleeding is better. Hopefully, the remicade is going to help the UC.

## 2011-06-29 ENCOUNTER — Other Ambulatory Visit: Payer: Self-pay | Admitting: Internal Medicine

## 2011-06-29 ENCOUNTER — Ambulatory Visit (HOSPITAL_COMMUNITY)
Admission: RE | Admit: 2011-06-29 | Discharge: 2011-06-29 | Disposition: A | Discharge status: Home / Self Care | Payer: Medicare HMO | Source: Ambulatory Visit | Attending: Internal Medicine | Admitting: Internal Medicine

## 2011-06-29 DIAGNOSIS — K5190 Ulcerative colitis, unspecified, without complications: Secondary | ICD-10-CM

## 2011-06-29 MED ORDER — SODIUM CHLORIDE 0.9 % IV SOLN
5.0000 mg/kg | Freq: Once | INTRAVENOUS | Status: AC
  Administered 2011-06-29: 300 mg via INTRAVENOUS
  Filled 2011-06-29: qty 30

## 2011-06-29 MED ORDER — DIPHENHYDRAMINE HCL 25 MG PO CAPS
25.0000 mg | ORAL_CAPSULE | Freq: Once | ORAL | Status: AC
  Administered 2011-06-29: 25 mg via ORAL
  Filled 2011-06-29: qty 1

## 2011-06-29 MED ORDER — ACETAMINOPHEN 325 MG PO TABS
650.0000 mg | ORAL_TABLET | Freq: Once | ORAL | Status: AC
  Administered 2011-06-29: 650 mg via ORAL
  Filled 2011-06-29: qty 2

## 2011-06-29 NOTE — Progress Notes (Signed)
Attempted PIV x 2 #22g, both in left anterior forearm.  Both unsuccessful.  Another RN to assess.

## 2011-07-01 ENCOUNTER — Telehealth: Payer: Self-pay | Admitting: Gastroenterology

## 2011-07-01 MED ORDER — PREDNISONE 5 MG PO TABS: ORAL_TABLET | ORAL | Status: DC

## 2011-07-01 NOTE — Telephone Encounter (Signed)
Mr Gasior stated pt is better, no blood in her stool and the urgency is better. Pt needs an appt for her 3rd dose which should be around 07/25/2011, but the appt had not been made. Informed husband I will sent the med order on Monday and then call and make the appt. We went over the Prednisone taper and pt will need extra 5mg  pills- will order. Pt is c/o itching and on 06/15/11 she called and I referred her itching problem to Dr Debby Bud who ordered Dliflucan x 10 days. Today, pt is still c/o itching in the vaginal area; denies a discharge. She has increased coughing and has episodes of choking. 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 Mary Sella at [facility] for her oral cancer check ups, but hasn't been lately d/t her problems with diarrhea. Please advise on itching, etc. Thanks.

## 2011-07-01 NOTE — Telephone Encounter (Signed)
Glad to hear colitis symptoms are better with Remicade. Hopefully this will be the answer for her colitis Regarding the vaginal itching, it is very difficult to understand what might be going on by phone. I do not know of an association as it relates to her IBD therapy This may be infectious but also could be dryness. I recommend that she be seen by her primary care who can evaluate and treat this issue further if needed

## 2011-07-01 NOTE — Telephone Encounter (Signed)
lmom for Mr [name] asking him to schedule his wife an appt with her GYN for the itching. We will send ofc notes to Dr Mary Sella next week.

## 2011-07-01 NOTE — Telephone Encounter (Signed)
lmom for husband to call back.

## 2011-07-07 ENCOUNTER — Telehealth: Payer: Self-pay | Admitting: Internal Medicine

## 2011-07-07 NOTE — Telephone Encounter (Signed)
Informed Mr [name] I will try to schedule pt tomorrow ; I have a reminder in.

## 2011-07-08 ENCOUNTER — Other Ambulatory Visit: Payer: Self-pay | Admitting: Internal Medicine

## 2011-07-08 DIAGNOSIS — K5190 Ulcerative colitis, unspecified, without complications: Secondary | ICD-10-CM

## 2011-07-08 NOTE — Telephone Encounter (Signed)
lmom for [name] that I have put the Remicade orders in and he should be able to schedule wife's 6 week Remicade appt.

## 2011-07-15 ENCOUNTER — Telehealth: Payer: Self-pay | Admitting: Internal Medicine

## 2011-07-15 NOTE — Telephone Encounter (Signed)
The pt has switched to [facility] for her rx - she needs these faxed over to them to start receiving her meds through this company.  Their phone number - [phone]  And their fax [phone]  Thanks!

## 2011-07-16 ENCOUNTER — Telehealth: Payer: Self-pay | Admitting: Oncology

## 2011-07-16 NOTE — Telephone Encounter (Signed)
S/w the pt's husband and he is aware of the march 2013 appt

## 2011-07-25 ENCOUNTER — Encounter (HOSPITAL_COMMUNITY): Payer: Self-pay

## 2011-07-25 ENCOUNTER — Telehealth: Payer: Self-pay | Admitting: *Deleted

## 2011-07-25 ENCOUNTER — Encounter (HOSPITAL_COMMUNITY)
Admission: RE | Admit: 2011-07-25 | Discharge: 2011-07-25 | Disposition: A | Discharge status: Home / Self Care | Payer: Medicare Other | Source: Ambulatory Visit | Attending: Internal Medicine | Admitting: Internal Medicine

## 2011-07-25 DIAGNOSIS — K5190 Ulcerative colitis, unspecified, without complications: Secondary | ICD-10-CM | POA: Insufficient documentation

## 2011-07-25 MED ORDER — DIPHENHYDRAMINE HCL 25 MG PO TABS
25.0000 mg | ORAL_TABLET | ORAL | Status: DC
  Administered 2011-07-25: 25 mg via ORAL

## 2011-07-25 MED ORDER — SODIUM CHLORIDE 0.9 % IV SOLN: 5.0000 mg/kg | Freq: Once | INTRAVENOUS | Status: DC

## 2011-07-25 MED ORDER — SODIUM CHLORIDE 0.9 % IV SOLN
300.0000 mg | Freq: Once | INTRAVENOUS | Status: AC
  Administered 2011-07-25: 300 mg via INTRAVENOUS
  Filled 2011-07-25: qty 30

## 2011-07-25 MED ORDER — ACETAMINOPHEN 325 MG PO TABS
650.0000 mg | ORAL_TABLET | ORAL | Status: DC
  Administered 2011-07-25: 650 mg via ORAL

## 2011-07-25 MED ORDER — SODIUM CHLORIDE 0.9 % IV SOLN
INTRAVENOUS | Status: DC
  Administered 2011-07-25: 12:00:00 via INTRAVENOUS

## 2011-07-25 NOTE — Telephone Encounter (Signed)
Graciella Freer, RN 07/25/2011 3:50 PM Signed  Pt's husband called to report pt had her 3rd Remicade tx today. There was a mix up on the location, but the tx went well. Pt reported to him and he empties the bedside commode, that she has had BRB in her stool for 6 days. She denies diarrhea or pain but has had episodes of urgency. Pt will finish her last dose of tapered Prednisone tomorrow- 5mg  daily- this about coincides with the blood when she decreased from 10mg .  Please advise. Thanks.  FYI pt has an appt next week at [facility] with Dr Mary Sella who did her surgery for oral cancer. Pt is having dysphagia; Mr Marlette reports the previous trach caused this problem.

## 2011-07-25 NOTE — Telephone Encounter (Signed)
For now let's just continue to watch her stools. How many BM's/day?  How much bleeding?  It is encouraging that this is not diarrhea. Lets see what Dr. Manson Passey thinks at follow-up before we address dysphagia. I probably need to see her in clinic before long. Would continue with pred taper for now.

## 2011-07-25 NOTE — Telephone Encounter (Signed)
Pt's husband called to report pt had her 3rd Remicade tx today. There was a mix up on the location, but the tx went well. Pt reported to him and he empties the bedside commode, that she has had BRB in her stool for 6 days. She denies diarrhea or pain but has had episodes of urgency.  Pt will finish her last dose of tapered Prednisone tomorrow- 5mg  daily- this about coincides with the blood when she decreased from 10mg . Please advise. Thanks.  FYI pt has an appt next week at [facility] with Dr Mary Sella who did her surgery for oral cancer. Pt is having dysphagia; Mr Heitman reports the previous trach caused this problem.

## 2011-07-26 NOTE — Telephone Encounter (Signed)
Spoke with pt's husband who verified the number of pt's stools; also reported there was very little bloodl in today's BM. Asked him to try to keep track of # of stools daily and the consistency. Informed him Dr [name] will see what Dr Manson Passey does about pt's dysphagia before addressing the problem and I will schedule a f/u with Dr [name] soon. Explained Dr [name] will see how pt does before ordering any prednisone. Pt completed the last prednisone tab today. Left ofc notes, scans,etc at the front desk for him to take to Dr Manson Passey tomorrow.

## 2011-07-26 NOTE — Telephone Encounter (Signed)
Spoke with pt, husband ran an errand. She reports a total of 3 bloody stools yesterday and this am, she has had 2. The stools are loose, but not to the point od diarrhea yet. I will call back later and speak with her husband d/t her dementia. Informed her we may wait until she sees Dr Manson Passey before pursuing any further action and Dr [name] doesn't want to increase the prednisone at this time. Pt stated understanding.

## 2011-08-12 ENCOUNTER — Telehealth: Payer: Self-pay

## 2011-08-12 NOTE — Telephone Encounter (Signed)
Pt's spouse called to inform MD that pt had emergency surgery 2 weeks ago at [facility] to remove a tumor on her vocal cords.

## 2011-08-18 ENCOUNTER — Encounter: Payer: Self-pay | Admitting: Radiation Oncology

## 2011-08-18 ENCOUNTER — Ambulatory Visit
Admission: RE | Admit: 2011-08-18 | Discharge: 2011-08-18 | Disposition: A | Discharge status: Home / Self Care | Payer: Medicare Other | Source: Ambulatory Visit | Attending: Radiation Oncology | Admitting: Radiation Oncology

## 2011-08-18 VITALS — BP 167/83 | HR 121 | Temp 97.5°F | Resp 18 | Ht 66.0 in | Wt 117.0 lb

## 2011-08-18 DIAGNOSIS — C321 Malignant neoplasm of supraglottis: Secondary | ICD-10-CM | POA: Insufficient documentation

## 2011-08-18 DIAGNOSIS — C329 Malignant neoplasm of larynx, unspecified: Secondary | ICD-10-CM

## 2011-08-18 DIAGNOSIS — Z85819 Personal history of malignant neoplasm of unspecified site of lip, oral cavity, and pharynx: Secondary | ICD-10-CM

## 2011-08-18 DIAGNOSIS — C060 Malignant neoplasm of cheek mucosa: Secondary | ICD-10-CM

## 2011-08-18 NOTE — Progress Notes (Signed)
71 year old female. Married. One son.  Difficulty swallowing and breathing since March. Also, patient noted changes in her voice. Transnasal fiberoptic laryngoscopy revealed a left supraglottic mass arising from the left AE fold that completely filled the supraglottis as a ball valve mass. Underwent excision of supraglottic tumor with CO2 laser. 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   NKDA No indication of a pacemaker Completed 5040 cGy in 28 sessions to the left breast with a boost of 1200 cGy in 6 sessions. Also, on 01/31/05 she completed 6120 cGy in 34 sessions to the left floor of mouth.

## 2011-08-18 NOTE — Progress Notes (Signed)
Please see the Nurse Progress Note in the MD Initial Consult Encounter for this patient. 

## 2011-08-18 NOTE — Progress Notes (Signed)
Patient presents to the clinic today accompanied by her husband for an reconsult with Dr. [place] Scrape reference supraglottis ca. Patient is alert and oriented to person, place, and time. No distress noted. Steady gait noted with assistance of cane. Patient reports eating a pureed diet without difficulty. Patient denies nausea, vomiting, headache or dizziness. Patient reports seeing blood in her stool related to the colitis. To clarify the supraglottic tumor was just found and removed three weeks ago by Dr. Manson Passey. Reported all findings to Dr. [place] Scrape.

## 2011-08-18 NOTE — Progress Notes (Signed)
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 Erroll [name] for evaluation of her biopsy proven squamous cell carcinoma of the supraglottic larynx. As mentioned above, she received postoperative radiation therapy in the management of her T2 N1 squamous cell carcinoma of the left floor of mouth, completed in July 2006. She first noted mild dysphagia and a change in her voice in the spring of 2012. She was hospitalized for a left pelvic fracture during the summer, and then a right hip fracture September. She had a lengthy hospitalization and evaluation of her dysphagia and voice change was delayed. 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 Any case, she saw Dr. Erroll [name] in late January and he noted a left supraglottic mass arising from the left AE fold that completely filled the supraglottic as a ball valve mass. 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 Dr. Manson [name] has is seen today to review her previous fields recognizing that she may not be a candidate for further radiation therapy. Her staging workup included CT scans of the neck and chest. She seen a have an ulcerated left  supraglottic laryngeal and hypopharyngeal mass with infiltration of the left paraglottic fat, felt to be either postoperative or related to tumor involvement. There also appeared to be a small right submandibular node which has enlarged slightly since 2008 he remains indeterminate. CT of the chest showed postsurgical changes related to the laryngeal mass resection with no evidence of metastatic disease.  Physical examination: Alert and oriented Nodes: No palpable adenopathy in the neck. Oral cavity is moist and surgical changes are noted along the left floor of mouth. No visible or palpable evidence for recurrent disease. Oropharynx unremarkable to inspection. On indirect mirror examination there is whitish granulating tissue along the posterior aspect of the left AE fold but no obvious mass. Both true vocal cords move well.  Impression: Probable clinical stage II (T2, N0, M0) squamous cell carcinoma of the supraglottic larynx, left AE fold. I reviewed her previous radiation therapy fields in great detail. Her entire larynx received approximately 5760 cGy in 32 sessions. Only her final reduced field to the floor of mouth was off of her larynx. I explained to the patient and her husband that she is not a candidate for any further radiation therapy to her upper neck/larynx. Surgery, which will probably require a total laryngectomy, is her only curative option. She will go back to see Dr. Erroll [name] for scheduling of her surgery. I also note that she will see Dr. Truett [name] for a followup visit in the near future.  45 minutes was spent face-to-face with the patient and her husband, primarily counseling the patient.

## 2011-08-19 NOTE — Progress Notes (Signed)
Encounter addended by: Ardell Isaacs, RN on: 08/19/2011  4:57 PM<BR>     Documentation filed: Inpatient Patient Education, Charges VN

## 2011-08-22 ENCOUNTER — Telehealth: Payer: Self-pay | Admitting: Internal Medicine

## 2011-08-22 ENCOUNTER — Other Ambulatory Visit: Payer: Self-pay | Admitting: Internal Medicine

## 2011-08-22 ENCOUNTER — Telehealth: Payer: Self-pay | Admitting: *Deleted

## 2011-08-22 MED ORDER — MESALAMINE 1.2 G PO TBEC: DELAYED_RELEASE_TABLET | ORAL | Status: DC

## 2011-08-22 NOTE — Telephone Encounter (Signed)
Spoke with husband who reports Dr Manson Passey at [facility] found a very large squamous cell tumor on her voice box. On examination, the tumor was so large if it fell over the right way, it could have blocked her entire airway. She was placed in ICU for OBS and Dr Manson Passey removed a large part of it to get her out of danger. Dr Theora Gianotti ofc is to call for surgery possibly this week. Pt saw Dr Dayton Scrape who stated he can't re-radiate the area and he can't offer anything. Pt is able to eat at the present time, but she lost a total of 16 lbs from dysphagia prior to her visit with Dr Manson Passey. Dr Manson Passey is aware pt is on Remicade and we will have to coordinate her tx with surgery; next Remicade is scheduled for 09/21/11. Informed Mr Bazar, Dr [name] will see them anytime; he will call when he hears from [facility].

## 2011-08-23 NOTE — Telephone Encounter (Signed)
MD made aware of message from [facility]

## 2011-09-05 ENCOUNTER — Telehealth: Payer: Self-pay | Admitting: *Deleted

## 2011-09-05 NOTE — Telephone Encounter (Signed)
Spoke with pt's wife for an update on her condition. Pt did have a laryngectomy at [facility] last Thursday by Dr Manson Passey and they are trying to get placement for her in a rehab facility. Mr Clegg stated there was no way he could do the stoma care and meds thru a tube; [facility]ilynne Drivers has implied they were discharging her yesterday. Pt has a f/u with Dr Truett Perna on 09/15/11 that is doubtful and is scheduled for Remicade on 09/21/11. Dr Truett Perna, would Remicade be ok for pt or will it interfere with her healing? ( question by pt's husband) Please advise. Thanks.

## 2011-09-07 NOTE — Telephone Encounter (Signed)
lmom for dr Kalman Drape nurse to call back on Remicade.

## 2011-09-08 ENCOUNTER — Telehealth: Payer: Self-pay | Admitting: *Deleted

## 2011-09-08 ENCOUNTER — Telehealth: Payer: Self-pay | Admitting: Oncology

## 2011-09-08 NOTE — Telephone Encounter (Signed)
Kem Boroughs, RN called back to state Dr Truett Perna is on vacation until 09/09/11. She will ask him to look at the encounter.

## 2011-09-08 NOTE — Telephone Encounter (Signed)
Received call from Graciella Freer nurse @ [facility]. Per Aram Beecham pt has is at [facility] with new ca and needs to r/s 3/14 appt. Aram Beecham given new appt for 4/22 @ 12:30 pm. Per Aram Beecham she will gv new appt to pt's husabnd. Also per Aram Beecham she has spoken with BS desk nurse (tanya).

## 2011-09-08 NOTE — Telephone Encounter (Signed)
Esau Grew, RN from Dr Kalman Drape of[facility] called back to defer future Remicade tx to Dr Manson Passey at [facility]. Phoned pt's husband to inform him and he had to take pt back to [facility] again late last night. Pt was discharged early yesterday and last night developed shortness of breath. Pt had " surgery " today to remove an object in the airway- don't know if it was a mucus plug or not. He will ask the attending about the Remicade. I will r/s appt with Dr Truett Perna. Mr Shores will let me know in time whether to cancel the Remicade.

## 2011-09-08 NOTE — Telephone Encounter (Signed)
Encounter opened in error

## 2011-09-13 ENCOUNTER — Telehealth: Payer: Self-pay | Admitting: Internal Medicine

## 2011-09-13 NOTE — Telephone Encounter (Signed)
Mr [name] was glad that we changed Dr [name] appt. He stated Dr [name] stated it would be OK for pt to receive Remicade on the 20th. Mr [name] states he thinks they are going to discharge the pt tomorrow; he still doesn't know if the SW has spoken to [facility]. Called [facility] [phone] and LMOM for someone to call back. He reports pt has a "Lary" tube and has to have 24 hr care to monitor for airway obstructions. He also reports pt has had bloody stools for about a week because pt wasn't given mesalamine.

## 2011-09-14 ENCOUNTER — Telehealth: Payer: Self-pay | Admitting: *Deleted

## 2011-09-14 NOTE — Telephone Encounter (Signed)
Spoke with Mr Figuero and they are now looking for placement at a SNF in [place], [place]. He reports she just had a large incontinent stool with some blood in it. He is uncertain as to whether she can come for the next Remicade infusion. He will keep [place] informed.

## 2011-09-14 NOTE — Telephone Encounter (Signed)
[facility] Admissions Coordinator with [facility] in [place] called to report since pt has Medicare Blue they cannot admit pt to a LTCA or  long term acute care HOSPITAL. She thought we might be able to appeal this by calling [employer]. Called [phone] and spoke with Victorino Dike who stated pt could stay for up to 100 days. When I called back to [facility], she stated that only applies to SNFs. They have SNF beds that are in network, but they are all full and she has no prospects on a death or discharge. She did suggest [facility] in [place]. Spoke with Mr Wiegman who will wait on Delilah Shan, Case Mgr to check on this.

## 2011-09-15 ENCOUNTER — Telehealth: Payer: Self-pay | Admitting: *Deleted

## 2011-09-15 ENCOUNTER — Ambulatory Visit: Payer: Medicare HMO | Admitting: Oncology

## 2011-09-15 NOTE — Telephone Encounter (Signed)
Spoke with Dr [name] about pt. Pt should not get Remicade if running a temp!! He would liked a GI referral, but wants to follow their protocol.  Spoke with Mr Martinek to request he ask if pt has had a referral to GI d/t her extensive GI hx; she at least needs a CDIFF done. He stated they did a CDIFF that was negative, but that was before she began antibiotics again. He will ask; he is on his way to [facility].

## 2011-09-15 NOTE — Telephone Encounter (Signed)
Pt's husband called to report they were able to get a room for pt at a SNF in [place], but pt developed a temp yesterday of 102 and it was 103 this am. Pt was placed on antibiotics and IV KCL.  As mentioned in earlier notes, pt missed almost a week of mesalamine therapy while in the hospital. Diarrhea has started up again and husband describes it as " pure water". Pt is due for her next Remicade on 09/21/11. Do you think pt needs a GI referral and possibly get Remicade early? Please advise.

## 2011-09-15 NOTE — Telephone Encounter (Signed)
Would not give remicade with fever or active infection She needs to be seen by inpatient GI consult service at her current hospital. We can fax records to help that service out if needed.

## 2011-09-16 ENCOUNTER — Telehealth: Payer: Self-pay | Admitting: Gastroenterology

## 2011-09-16 NOTE — Telephone Encounter (Signed)
Ok, thanks.

## 2011-09-16 NOTE — Telephone Encounter (Signed)
Mr Bergquist called with an update on his wife. 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 Pt is receiving mesalamine suppositories again and her urgency is better. He asked for a GI referral yesterday and was asked to wait a few days.  He will call latter for updates.

## 2011-09-19 ENCOUNTER — Telehealth: Payer: Self-pay | Admitting: *Deleted

## 2011-09-19 NOTE — Telephone Encounter (Signed)
[name] called back and we tentatively scheduled pt for 09/28/11 at 1pm.

## 2011-09-19 NOTE — Telephone Encounter (Signed)
Mr Richner called to report pt had a better weekend and her fever is under control now. The latest discharge date is 09/22/11. Pt is scheduled for Remicade on 09/21/11 and I need to r/s that. He still hopes to get pt into the nursing facility at [facility]. lmom for Marylu Lund in [place] Stay at [facility] to call me back.

## 2011-09-21 ENCOUNTER — Encounter (HOSPITAL_COMMUNITY): Payer: Medicare HMO

## 2011-09-26 ENCOUNTER — Telehealth: Payer: Self-pay | Admitting: Internal Medicine

## 2011-09-26 NOTE — Telephone Encounter (Signed)
Husband called to give [place] an update on the pt. She was moved to a rehab facility in [place] on Friday. The only doctor's orders were than pt must be seen by GI prior to her Remicade infusion; pt is scheduled 09/28/11 at 1:30pm for the infusion. Mesalamine suppositories have been increased to BID . He reports the diarrhea has slowed down and the stools remain watery, but no visible blood. OK to proceed with the Remicade, she is already a week late? Thanks.

## 2011-09-26 NOTE — Telephone Encounter (Signed)
Spoke with Dr [name] who ordered pt to be seen prior to her infusion. Informed Mr Ibe of 11am appt on 09/28/11. The only thing pt will need at Short Stay will be suction and a trach cleaning kit; H2O2 if not included in the kit. He will take her supplies for the tube feeding.

## 2011-09-27 ENCOUNTER — Other Ambulatory Visit: Payer: Self-pay | Admitting: Internal Medicine

## 2011-09-27 ENCOUNTER — Encounter: Payer: Self-pay | Admitting: Internal Medicine

## 2011-09-27 DIAGNOSIS — K529 Noninfective gastroenteritis and colitis, unspecified: Secondary | ICD-10-CM

## 2011-09-28 ENCOUNTER — Ambulatory Visit (INDEPENDENT_AMBULATORY_CARE_PROVIDER_SITE_OTHER): Payer: Medicare Other | Admitting: Internal Medicine

## 2011-09-28 ENCOUNTER — Encounter: Payer: Self-pay | Admitting: Internal Medicine

## 2011-09-28 ENCOUNTER — Encounter (HOSPITAL_COMMUNITY)
Admission: RE | Admit: 2011-09-28 | Discharge: 2011-09-28 | Disposition: A | Discharge status: Home / Self Care | Payer: Medicare Other | Source: Ambulatory Visit | Attending: Internal Medicine | Admitting: Internal Medicine

## 2011-09-28 VITALS — BP 90/30 | HR 80 | Temp 98.6°F | Ht 66.0 in | Wt 116.4 lb

## 2011-09-28 DIAGNOSIS — K5190 Ulcerative colitis, unspecified, without complications: Secondary | ICD-10-CM

## 2011-09-28 NOTE — Progress Notes (Signed)
Subjective:    Patient ID: [name], female    DOB: [birth year], 71 y.o.   MRN: [id number]  HPI [name] is a 71 year old female with a complicated past medical history including but not limited to hypertension, hyperlipidemia, peripheral vascular disease, C. difficile colitis, ulcerative colitis on Remicade for the past 3 months, and laryngeal cancer status post resection recently who is seen in followup. She is accompanied today by her husband, and she is currently living in a rehabilitation facility in [facility]. Her history of late has been complicated and includes a three-week hospitalization at [facility] for laryngectomy and tumor removal. During that time she had worsening of her colitis manifested by bloody diarrhea. At this time it was discovered that she was off of her mesalamine preparation. After her recent surgery, a G-tube was placed in all of her medicines have been delivered via tube. She is taking nothing by mouth. She is due for Remicade infusion today.  Overall she and her husband report that while on rectal mesalamine enemas her colitis symptoms are well-controlled. She's not having much in the way of abdominal pain. Her stools are somewhat loose, and this worsened after tube feeds were initiated. She was having bloody diarrhea, but they have not seen blood in her stools recently after resuming mesalamine enemas. Overall her energy level is poor as she struggles to rehabilitation from recent surgery. Her husband reports she was seen by the GI consult service during her hospitalization and the PR mesalamine enemas were resumed. She was tested for C. difficile colitis which was negative.  Review of Systems As per history of present illness  Patient Active Problem List  Diagnoses  . Personal history of colonic polyps  . HYPOTHYROIDISM  . HYPERLIPIDEMIA  . HYPERTENSION  . PERIPHERAL VASCULAR DISEASE  . ALLERGIC RHINITIS CAUSE UNSPECIFIED  . MILD DYSPLASIA OF  CERVIX  . ATAXIA  . URINARY INCONTINENCE  . ADENOCARCINOMA, BREAST, BILATERAL, HX OF  . CARCINOMA, SQUAMOUS CELL, HX OF  . ALCOHOL ABUSE, HX OF  . Other Personal History of Disorders of Nervous System and Sense Organs  . CEREBROVASCULAR DISEASE, HX OF  . HEMATOCHEZIA, HX OF  . UTI'S, HX OF  . CAROTID ENDARTERECTOMY, LEFT, HX OF  . CATARACT EXTRACTIONS, BILATERAL, HX OF  . DILATION AND CURETTAGE, HX OF  . Diarrhea  . History of ASCVD  . UC (ulcerative colitis)  . Edema  . Dysphagia  . Anemia, iron deficiency  . Larynx cancer   Past Surgical History  Procedure Date  . Subdural hematoma evacuation via craniotomy   . 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 Multiple tooth extractions     due to oral cancer  . Cataract extraction, bilateral   . Breast lumpectomy     left breast with radiation therapy  . Carotid endarterectomy     left  . Mastectomy     Right, history with nodule dissection  . Orif hip fracture Sept '12    Right hip: screw and plate repair.  . Larynx surgery 08/2010    [facility]   Current Outpatient Prescriptions  Medication Sig Dispense Refill  . acetaminophen (TYLENOL 8 HOUR) 650 MG CR tablet 650 mg as needed. Via g tube      . ascorbic acid (VITAMIN C) 500 MG/5ML syrup 500 mg daily. Via g tube      . aspirin 81 MG tablet 81 mg daily. Via g tube       .  calcium carbonate (OS-CAL) 600 MG TABS 600 mg daily. VIA Gtube      . ciprofloxacin (CIPRO) 250 MG/5ML (5%) SUSR Take every 12 hours for 5 days      . diltiazem (CARDIZEM CD) 180 MG 24 hr capsule 45 mg daily. every 6 hours VIA g tube      . folic acid (FOLVITE) 1 MG tablet 1 mg daily. VIA G Tube      . levothyroxine (LEVOTHROID) 50 MCG tablet 50 mcg daily. VIA G tube      . lidocaine (XYLOCAINE) 2 % jelly as needed. VIA G tube       . Mesalamine 4 GM/60ML SF ENEM Place 1 enema rectally 2 (two) times daily.      . Multiple Vitamin (MULTIVITAMIN) capsule 1 capsule daily. VIA G tube       . Nutritional Supplements (GLUCERNA 1.5 CAL PO) every 4 hours with 200 ml of water flush      . PHENobarbital (LUMINAL) 97.2 MG tablet       . phenytoin (DILANTIN) 100 MG ER capsule 4 mls three times a day via g tube      . pravastatin (PRAVACHOL) 40 MG tablet Take 40 mg by mouth. Via g tube      . DISCONTD: diltiazem (CARDIZEM CD) 180 MG 24 hr capsule Take 1 capsule (180 mg total) by mouth daily.  90 capsule  2  . DISCONTD: PHENobarbital (LUMINAL) 97.2 MG tablet TAKE 1 TABLET TWICE DAILY  180 tablet  0   Current Facility-Administered Medications  Medication Dose Route Frequency Provider Last Rate Last Dose  . 0.9 %  sodium chloride infusion  500 mL Intravenous Continuous Jacques Navy, MD       No Known Allergies  Family History  Problem Relation Age of Onset  . Coronary artery disease Mother   . Heart disease Mother   . Diabetes Sister   . Breast cancer Maternal Aunt   . Breast cancer Sister    History   Social History  . Marital Status: Married    Spouse Name: N/A    Number of Children: 2  . Years of Education: N/A   Occupational History  . retired     Sales executive   Social History Main Topics  . Smoking status: Former Games developer  . Smokeless tobacco: Never Used  . Alcohol Use: 4.2 oz/week    7 Glasses of wine per week  . Drug Use: No  . Sexually Active: Not Currently   Other Topics Concern  . None   Social History Narrative   HSG. Married x 7 years divorced; married 12/18/67. 1 son- died as a neonate, 1 son- 12-18-2058. Retired- worked as a Sales executive.   End of life: has a living will- does not want futile/ heroic care; no cpr.  Marriage- reports marriage is in good health (4/10)       Objective:   Physical Exam BP 90/30  Pulse 80  Temp(Src) 98.6 F (37 C) (Axillary)  Ht 5\' 6"  (1.676 m)  Wt 116 lb 6.4 oz (52.799 kg)  BMI 18.79 kg/m2 Constitutional: Chronically ill-appearing with a somewhat cushingoid face. No acute distress HEENT: Head is  wrapped with Kerlix gauze, tracheostomy in place, anicteric Neck: Tracheostomy in place Cardiovascular: Normal rate, regular rhythm and intact distal pulses. No M/R/G Pulmonary/chest: Effort normal and breath sounds normal. No wheezing, rales or rhonchi. Abdominal: Soft, mild diffuse tenderness without rebound or guarding, nondistended. Bowel sounds  active throughout. G-tube without surrounding erythema or induration  Extremities: no clubbing, cyanosis, trace pretibial edema Neurological: Alert and oriented to person place and time. Skin: Skin is warm and dry. No rashes noted.     Assessment & Plan:  71 year old female with a complicated past medical history including but not limited to hypertension, hyperlipidemia, peripheral vascular disease, C. difficile colitis, ulcerative colitis on Remicade for the past 3 months, and laryngeal cancer status post resection recently who is seen in followup  1. Ulcerative colitis -- the patient is currently due for Remicade infusion, however I am reluctant to continue this therapy given her issues with recent laryngeal cancer and surgical resection. I also am not convinced of its efficacy for her.  Her colitis symptoms seem to recur in the hospital when the mesalamine was withdrawn. For now I have recommended discontinuation of Remicade/biologic therapy given her other medical conditions which supersede her colitis at this time. I think for wound healing and recovery, the less immune suppression she has the better. Unfortunately, she is not taking any medications by mouth, and this takes away the possibility of oral mesalamine therapies, as this medication cannot be crushed for VT administration. For now I recommend continuing Rowasa enemas twice a day. I've written instructions for her to remain lying down after administration, for at least one hour in the morning, and preferably just before sleeping each bedtime. I'll plan to see her back in about 3 months time or  sooner if necessary.

## 2011-09-28 NOTE — Patient Instructions (Signed)
Discontinue Remicade Please see Dr. Jarrett Ables regarding medications

## 2011-10-13 ENCOUNTER — Telehealth: Payer: Self-pay | Admitting: Internal Medicine

## 2011-10-13 NOTE — Telephone Encounter (Signed)
Pt's husband reports they saw Dr Manson Passey at [facility]. Her fistula has healed and she's advancing her diet and should start taking po meds as soon as they get the order from Dr Manson Passey. Feeding tube was left in and they will see Dr Manson Passey again in 1 month. Urgency is horrible, but bleeding is no worse. Explained you stated the oral drugs will help the colon more than the suppositories and he remembers that. He will call for further updates, problems or questions; informed him I will send this to Dr [name].

## 2011-10-20 ENCOUNTER — Telehealth: Payer: Self-pay | Admitting: Internal Medicine

## 2011-10-20 NOTE — Telephone Encounter (Signed)
Pt's husband called to give [place] an update. Pt will leave rehab Saturday and go home for the 1st time in 7 weeks. She is on a soft diet and taking her meds orally which means the mesalamine is working better than the suppositories were. Pt's stool is forming a little now and the blood has slowed down. Mr Ellerman will call if he needs a refill call in. FYI Dr [name].

## 2011-10-21 ENCOUNTER — Telehealth: Payer: Self-pay | Admitting: Oncology

## 2011-10-21 NOTE — Telephone Encounter (Signed)
Pt's husband called and cancelled appt for 10/24/11, cancelled appt and informed Kem Boroughs RN

## 2011-10-24 ENCOUNTER — Ambulatory Visit: Payer: Medicare HMO | Admitting: Oncology

## 2011-10-24 ENCOUNTER — Telehealth: Payer: Self-pay | Admitting: *Deleted

## 2011-10-24 NOTE — Telephone Encounter (Addendum)
Melissa, nurse at [facility] reports pt started vomiting Friday and stat labs were done that were normal. No vomiting on Saturday, but a couple of episodes on Sunday. Today, pt had a large amount of BRB emesis. Pt denies pain in her abdomen and her vital signs are stable. The md for the center stated since all labs were normal, call us for suggestions, but they can do fluids and a CT if we would like.  Per Dr [name], monitor pt for bleeding and if the emesis with blood continues, repeat H&H and report to [place]. Start pt on Pantoprazole 40mg  daily. Melissa reports pt was started on Omeprazole on 10/22/11. Melissa stated understanding.

## 2011-10-25 ENCOUNTER — Telehealth: Payer: Self-pay | Admitting: Internal Medicine

## 2011-10-25 NOTE — Telephone Encounter (Signed)
Melissa called to report pt's lab results; WBC has increased from 11.8 on 10/21/11 to 19.3 today, H&H hasn't been that drastic. She has a call in to Dr Yetta Flock, but he hasn't called back. Informed Melissa Dr [name] is afraid pt may have an infection, nosocomial or perhaps CDIFF, and prefers she be brought to [facility] ER for evaluation. Melissa will wiat on Dr Yetta Flock to call back because pt may be able to be treated in house if it's an infection. Informed Mr Dowers of this and I will check back before 5pm; hr stated understanding.

## 2011-10-25 NOTE — Telephone Encounter (Signed)
Husband called to report Dr Yetta Flock at [facility] was going to order a CT scan on pt. She has been vomiting since 10/21/11. Informed mr Bohlin we were aware of the situation and I will call and speak with the nurse; he stated understanding. Call Melissa who had just come on duty. She will get report and call me.

## 2011-10-25 NOTE — Telephone Encounter (Signed)
Spoke with husband who stated they are still waiting on the doc to call back. He stated they may be waiting on prior auth for the CT and he thinks they can go at 0945am in the am. If her keeps her there, they may want to start fluids and keep an eye on her temp to monitor for sepsis. Husband will call me in the am.

## 2011-10-26 NOTE — Telephone Encounter (Signed)
Spoke with the Ward Clerk for pt at [facility] [place] [address], and she reports pt is at [facility] at present for CT Abd/pelvis. She reports pt was able to keep the contrast down and she had not heard of any bleeding during the night.

## 2011-10-26 NOTE — Telephone Encounter (Signed)
Received a copy of the CT report which only shows gall bladder distension.  Mr Grieb called back and stated the pt was sleeping; he wanted to know if he should bring her to [facility]. Informed him I had no one to admit her that Dr [name] is off. If he chooses to bring her to [facility] ER, he should tell them she has been vomiting blood since Friday. He should bring a copy of her labs and a copy of the CT scan.   After speaking with Melissa the 2nd shift nurse, pt missed labs today because she was gone for the CT. Melissa has been giving pt po Zofran prior to her meds so pt can keep them down. Mr Crocker is undecided about whether to bring pt tonight or wait until morning. Christian Mate is giving her antibiotics and apparently gave her fluids last pm. Informed Mr Reader Dr Russella Dar will be on call and a Hospitalist will have to admit the pt.  Mr Alexa called back at 4:38pm and stated he spoke with Dr Yetta Flock and they will do a CBC. He stated they will let her rest tonight.  I requested he ask for a CMET and fluids tonight and asked what antibiotic she is on- he did not know. He will probably bring her in tomorrow to the ER.

## 2011-10-27 ENCOUNTER — Emergency Department (HOSPITAL_COMMUNITY): Payer: Medicare Other

## 2011-10-27 ENCOUNTER — Inpatient Hospital Stay (HOSPITAL_COMMUNITY): Payer: Medicare Other

## 2011-10-27 ENCOUNTER — Encounter (HOSPITAL_COMMUNITY): Payer: Self-pay | Admitting: Emergency Medicine

## 2011-10-27 ENCOUNTER — Telehealth: Payer: Self-pay | Admitting: *Deleted

## 2011-10-27 ENCOUNTER — Inpatient Hospital Stay (HOSPITAL_COMMUNITY)
Admission: EM | Admit: 2011-10-27 | Discharge: 2011-10-31 | DRG: 394 | Disposition: A | Discharge status: Home Health | Payer: Medicare Other | Attending: Internal Medicine | Admitting: Internal Medicine

## 2011-10-27 DIAGNOSIS — I10 Essential (primary) hypertension: Secondary | ICD-10-CM

## 2011-10-27 DIAGNOSIS — Y833 Surgical operation with formation of external stoma as the cause of abnormal reaction of the patient, or of later complication, without mention of misadventure at the time of the procedure: Secondary | ICD-10-CM | POA: Diagnosis present

## 2011-10-27 DIAGNOSIS — K9429 Other complications of gastrostomy: Principal | ICD-10-CM | POA: Diagnosis present

## 2011-10-27 DIAGNOSIS — C329 Malignant neoplasm of larynx, unspecified: Secondary | ICD-10-CM

## 2011-10-27 DIAGNOSIS — Z923 Personal history of irradiation: Secondary | ICD-10-CM

## 2011-10-27 DIAGNOSIS — K5289 Other specified noninfective gastroenteritis and colitis: Secondary | ICD-10-CM | POA: Diagnosis present

## 2011-10-27 DIAGNOSIS — R1013 Epigastric pain: Secondary | ICD-10-CM

## 2011-10-27 DIAGNOSIS — Z853 Personal history of malignant neoplasm of breast: Secondary | ICD-10-CM

## 2011-10-27 DIAGNOSIS — E871 Hypo-osmolality and hyponatremia: Secondary | ICD-10-CM | POA: Diagnosis present

## 2011-10-27 DIAGNOSIS — K529 Noninfective gastroenteritis and colitis, unspecified: Secondary | ICD-10-CM

## 2011-10-27 DIAGNOSIS — Z8673 Personal history of transient ischemic attack (TIA), and cerebral infarction without residual deficits: Secondary | ICD-10-CM

## 2011-10-27 DIAGNOSIS — K5190 Ulcerative colitis, unspecified, without complications: Secondary | ICD-10-CM

## 2011-10-27 DIAGNOSIS — E785 Hyperlipidemia, unspecified: Secondary | ICD-10-CM | POA: Diagnosis present

## 2011-10-27 DIAGNOSIS — Z930 Tracheostomy status: Secondary | ICD-10-CM

## 2011-10-27 DIAGNOSIS — Z8521 Personal history of malignant neoplasm of larynx: Secondary | ICD-10-CM

## 2011-10-27 DIAGNOSIS — E039 Hypothyroidism, unspecified: Secondary | ICD-10-CM | POA: Diagnosis present

## 2011-10-27 DIAGNOSIS — D638 Anemia in other chronic diseases classified elsewhere: Secondary | ICD-10-CM | POA: Diagnosis present

## 2011-10-27 DIAGNOSIS — D500 Iron deficiency anemia secondary to blood loss (chronic): Secondary | ICD-10-CM | POA: Diagnosis present

## 2011-10-27 DIAGNOSIS — R112 Nausea with vomiting, unspecified: Secondary | ICD-10-CM | POA: Diagnosis present

## 2011-10-27 DIAGNOSIS — I739 Peripheral vascular disease, unspecified: Secondary | ICD-10-CM | POA: Diagnosis present

## 2011-10-27 LAB — APTT: aPTT: 33 seconds (ref 24–37)

## 2011-10-27 LAB — CBC
HCT: 29.5 % — ABNORMAL LOW (ref 36.0–46.0)
MCHC: 33.2 g/dL (ref 30.0–36.0)
MCV: 89.1 fL (ref 78.0–100.0)
Platelets: 248 10*3/uL (ref 150–400)
Platelets: 271 10*3/uL (ref 150–400)
RBC: 3.25 MIL/uL — ABNORMAL LOW (ref 3.87–5.11)
RDW: 14.9 % (ref 11.5–15.5)
WBC: 11.9 10*3/uL — ABNORMAL HIGH (ref 4.0–10.5)

## 2011-10-27 LAB — DIFFERENTIAL
Basophils Absolute: 0 10*3/uL (ref 0.0–0.1)
Basophils Relative: 0 % (ref 0–1)
Eosinophils Absolute: 0 10*3/uL (ref 0.0–0.7)
Eosinophils Relative: 0 % (ref 0–5)
Lymphocytes Relative: 15 % (ref 12–46)
Lymphs Abs: 1.8 10*3/uL (ref 0.7–4.0)
Monocytes Absolute: 0.8 10*3/uL (ref 0.1–1.0)
Neutrophils Relative %: 77 % (ref 43–77)

## 2011-10-27 LAB — COMPREHENSIVE METABOLIC PANEL
AST: 19 U/L (ref 0–37)
Albumin: 2.9 g/dL — ABNORMAL LOW (ref 3.5–5.2)
Calcium: 8.7 mg/dL (ref 8.4–10.5)
Creatinine, Ser: 0.57 mg/dL (ref 0.50–1.10)
Total Protein: 7.3 g/dL (ref 6.0–8.3)

## 2011-10-27 LAB — URINALYSIS, ROUTINE W REFLEX MICROSCOPIC
Glucose, UA: NEGATIVE mg/dL
Protein, ur: NEGATIVE mg/dL
Specific Gravity, Urine: 1.011 (ref 1.005–1.030)
pH: 6 (ref 5.0–8.0)

## 2011-10-27 LAB — PHENOBARBITAL LEVEL: Phenobarbital: 33.4 ug/mL (ref 15.0–40.0)

## 2011-10-27 LAB — PROTIME-INR: Prothrombin Time: 16.4 seconds — ABNORMAL HIGH (ref 11.6–15.2)

## 2011-10-27 LAB — LACTIC ACID, PLASMA: Lactic Acid, Venous: 0.8 mmol/L (ref 0.5–2.2)

## 2011-10-27 LAB — URINE MICROSCOPIC-ADD ON

## 2011-10-27 MED ORDER — ASPIRIN 81 MG PO CHEW
81.0000 mg | CHEWABLE_TABLET | Freq: Every day | ORAL | Status: DC
  Administered 2011-10-28 – 2011-10-31 (×4): 81 mg
  Filled 2011-10-27 (×4): qty 1

## 2011-10-27 MED ORDER — ONDANSETRON HCL 4 MG PO TABS: 4.0000 mg | ORAL_TABLET | Freq: Four times a day (QID) | ORAL | Status: DC | PRN

## 2011-10-27 MED ORDER — PHENYTOIN SODIUM EXTENDED 100 MG PO CAPS: 100.0000 mg | ORAL_CAPSULE | Freq: Every day | ORAL | Status: DC

## 2011-10-27 MED ORDER — ASPIRIN EC 81 MG PO TBEC: 81.0000 mg | DELAYED_RELEASE_TABLET | Freq: Every day | ORAL | Status: DC

## 2011-10-27 MED ORDER — SODIUM CHLORIDE 0.9 % IV SOLN
INTRAVENOUS | Status: DC
  Administered 2011-10-28: 12:00:00 via INTRAVENOUS
  Administered 2011-10-29: 75 mL/h via INTRAVENOUS
  Administered 2011-10-29: via INTRAVENOUS
  Administered 2011-10-29 – 2011-10-30 (×2): 75 mL/h via INTRAVENOUS
  Administered 2011-10-31: 03:00:00 via INTRAVENOUS

## 2011-10-27 MED ORDER — DIPHENHYDRAMINE HCL 12.5 MG/5ML PO ELIX: 25.0000 mg | ORAL_SOLUTION | ORAL | Status: DC

## 2011-10-27 MED ORDER — SODIUM CHLORIDE 0.9 % IV SOLN: 5.0000 mg/kg | INTRAVENOUS | Status: DC

## 2011-10-27 MED ORDER — ONDANSETRON HCL 4 MG/2ML IJ SOLN
INTRAMUSCULAR | Status: AC
  Administered 2011-10-28: 4 mg via INTRAVENOUS
  Filled 2011-10-27: qty 2

## 2011-10-27 MED ORDER — SODIUM CHLORIDE 0.9 % IV SOLN: INTRAVENOUS | Status: DC

## 2011-10-27 MED ORDER — PHENOBARBITAL 16.2 MG PO TABS
97.2000 mg | ORAL_TABLET | Freq: Two times a day (BID) | ORAL | Status: DC
  Administered 2011-10-27 – 2011-10-31 (×8): 97.2 mg via ORAL
  Filled 2011-10-27: qty 6
  Filled 2011-10-27: qty 1
  Filled 2011-10-27: qty 3
  Filled 2011-10-27: qty 1
  Filled 2011-10-27 (×5): qty 3
  Filled 2011-10-27: qty 2
  Filled 2011-10-27: qty 1

## 2011-10-27 MED ORDER — MESALAMINE 1.2 G PO TBEC
1.2000 g | DELAYED_RELEASE_TABLET | Freq: Four times a day (QID) | ORAL | Status: DC
  Administered 2011-10-28 – 2011-10-31 (×13): 1.2 g via ORAL
  Filled 2011-10-27 (×20): qty 1

## 2011-10-27 MED ORDER — SODIUM CHLORIDE 0.9 % IV BOLUS (SEPSIS): 1000.0000 mL | Freq: Once | INTRAVENOUS | Status: DC

## 2011-10-27 MED ORDER — SODIUM CHLORIDE 0.9 % IV SOLN: INTRAVENOUS | Status: AC

## 2011-10-27 MED ORDER — ONDANSETRON HCL 4 MG/2ML IJ SOLN
4.0000 mg | Freq: Four times a day (QID) | INTRAMUSCULAR | Status: DC | PRN
  Administered 2011-10-27 – 2011-10-28 (×2): 4 mg via INTRAVENOUS
  Filled 2011-10-27: qty 2

## 2011-10-27 MED ORDER — MESALAMINE 4 G RE ENEM
4.0000 g | ENEMA | Freq: Two times a day (BID) | RECTAL | Status: DC
  Administered 2011-10-28 – 2011-10-31 (×7): 4 g via RECTAL
  Filled 2011-10-27 (×9): qty 60

## 2011-10-27 MED ORDER — HYDRALAZINE HCL 20 MG/ML IJ SOLN: 10.0000 mg | Freq: Four times a day (QID) | INTRAMUSCULAR | Status: DC | PRN

## 2011-10-27 MED ORDER — CIPROFLOXACIN IN D5W 400 MG/200ML IV SOLN
400.0000 mg | Freq: Two times a day (BID) | INTRAVENOUS | Status: DC
  Administered 2011-10-27: 400 mg via INTRAVENOUS
  Filled 2011-10-27 (×2): qty 200

## 2011-10-27 MED ORDER — ACETAMINOPHEN 167 MG/5ML PO LIQD: 650.0000 mg | ORAL | Status: DC

## 2011-10-27 MED ORDER — MORPHINE SULFATE 2 MG/ML IJ SOLN: 1.0000 mg | INTRAMUSCULAR | Status: DC | PRN

## 2011-10-27 MED ORDER — LIDOCAINE VISCOUS 2 % MT SOLN
20.0000 mL | OROMUCOSAL | Status: DC | PRN
  Administered 2011-10-30: 20 mL via OROMUCOSAL
  Filled 2011-10-27: qty 20

## 2011-10-27 MED ORDER — ALBUTEROL SULFATE (5 MG/ML) 0.5% IN NEBU: 2.5000 mg | INHALATION_SOLUTION | RESPIRATORY_TRACT | Status: DC | PRN

## 2011-10-27 MED ORDER — SENNOSIDES-DOCUSATE SODIUM 8.6-50 MG PO TABS
1.0000 | ORAL_TABLET | Freq: Every evening | ORAL | Status: DC | PRN
  Filled 2011-10-27: qty 1

## 2011-10-27 MED ORDER — PHENYTOIN 125 MG/5ML PO SUSP
100.0000 mg | Freq: Three times a day (TID) | ORAL | Status: DC
  Administered 2011-10-27 – 2011-10-31 (×11): 100 mg
  Filled 2011-10-27 (×13): qty 4

## 2011-10-27 MED ORDER — PROMETHAZINE HCL 25 MG/ML IJ SOLN
12.5000 mg | Freq: Once | INTRAMUSCULAR | Status: AC
  Administered 2011-10-27: 12.5 mg via INTRAVENOUS
  Filled 2011-10-27: qty 1

## 2011-10-27 MED ORDER — HYDROCODONE-ACETAMINOPHEN 5-325 MG PO TABS: 1.0000 | ORAL_TABLET | ORAL | Status: DC | PRN

## 2011-10-27 NOTE — Telephone Encounter (Signed)
See later note

## 2011-10-27 NOTE — Telephone Encounter (Signed)
Pt's husband called this am and he would like pt admitted to [facility] as discussed yesterday. Spoke with Marcelino Duster who is pt's nurse today and she reports pt is receiving Levaquin 750mg  IV daily; WBC has dropped from 19 to 13 yesterday. Marcelino Duster will fax [place] labs and I will discuss admission when Dr [name] arrives this am.

## 2011-10-27 NOTE — Telephone Encounter (Signed)
Will fax CBC and CT scan to [facility] ER Charge Nurse, Diane, [phone].

## 2011-10-27 NOTE — ED Notes (Signed)
Pt has laryngeal tube placed 3 months ago s/p throat cancer.

## 2011-10-27 NOTE — ED Provider Notes (Signed)
History     CSN: [id number]  Arrival date & time 10/27/11  1408   First MD Initiated Contact with Patient 10/27/11 1454      Chief Complaint  Patient presents with  . Emesis    (Consider location/radiation/quality/duration/timing/severity/associated sxs/prior treatment) Patient is a 71 y.o. female presenting with vomiting. The history is provided by the patient and the spouse.  Emesis  This is a new problem. Episode onset: 6 days ago. The problem occurs 5 to 10 times per day. The problem has been gradually improving. The emesis has an appearance of stomach contents. There has been no fever. Associated symptoms include abdominal pain and cough. Pertinent negatives include no chills, no diarrhea and no fever. Associated symptoms comments: Pain around her G-tube site which is approximately 6 weeks postop. Risk factors: Recently restarted on solid foods after having a laryngectomy.    Past Medical History  Diagnosis Date  . Peripheral vascular disease   . Subdural hematoma   . Ataxia   . Mild dysplasia of cervix   . UTI (lower urinary tract infection)   . Urinary incontinence   . Seizures   . Cerebrovascular disease   . Hypothyroidism   . Personal history of colonic polyps     adenomatous 1997 & tubular adenoma and hyplastic  2008  . Hyperlipidemia   . Adenocarcinoma, breast     bilateral  . H/O alcohol abuse   . Diverticulosis of colon (without mention of hemorrhage)   . Redundant colon   . Squamous cell carcinoma of mouth   . Stroke   . C. difficile colitis   . Dementia t  . Ulcerative colitis     Past Surgical History  Procedure Date  . Subdural hematoma evacuation via craniotomy   . 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 Multiple tooth extractions     due to oral cancer  . Cataract extraction, bilateral   . Breast lumpectomy     left breast with radiation therapy  . Carotid endarterectomy     left  . Mastectomy     Right, history with  nodule dissection  . Orif hip fracture Sept '12    Right hip: screw and plate repair.  . [facility]arynx surgery 08/2010    [facility]    Family History  Problem Relation Age of Onset  . Coronary artery disease Mother   . Heart disease Mother   . Diabetes Sister   . Breast cancer Maternal Aunt   . Breast cancer Sister     History  Substance Use Topics  . Smoking status: Former Games developer  . Smokeless tobacco: Never Used  . Alcohol Use: 4.2 oz/week    7 Glasses of wine per week    OB History    Grav Para Term Preterm Abortions TAB SAB Ect Mult Living                  Review of Systems  Constitutional: Negative for fever and chills.  Respiratory: Positive for cough.   Gastrointestinal: Positive for vomiting and abdominal pain. Negative for diarrhea.  All other systems reviewed and are negative.    Allergies  Review of patient's allergies indicates no known allergies.  Home Medications   Current Outpatient Rx  Name Route Sig Dispense Refill  . ASPIRIN 81 MG PO TABS  81 mg daily. Via g tube     . MESALAMINE 4 GM/60ML SF RE ENEM Rectal Place 1 enema rectally 2 (two)  times daily.    Marland Kitchen GLUCERNA 1.5 CAL PO  every 4 hours with 200 ml of water flush    . PHENOBARBITAL 97.2 MG PO TABS Gastrostomy Tube 97.2 mg by Gastrostomy Tube route 2 (two) times daily.     Marland Kitchen PHENYTOIN SODIUM EXTENDED 100 MG PO CAPS  4 mls three times a day via g tube      BP 112/56  Pulse 78  Temp(Src) 98.3 F (36.8 C) (Oral)  Resp 16  SpO2 98%  Physical Exam  Nursing note and vitals reviewed. Constitutional: She is oriented to person, place, and time. She appears well-developed. She appears cachectic. No distress.  HENT:  Head: Normocephalic and atraumatic.  Eyes: EOM are normal. Pupils are equal, round, and reactive to light.  Neck:    Cardiovascular: Normal rate, regular rhythm and intact distal pulses.  Exam reveals no friction rub.   Murmur heard.  Crescendo systolic murmur is present with a  grade of 2/6  Pulmonary/Chest: Effort normal. She has decreased breath sounds. She has no wheezes. She has no rales.  Abdominal: Soft. Bowel sounds are normal. She exhibits no distension. There is tenderness. There is no rebound, no guarding, no CVA tenderness and negative Murphy's sign.    Musculoskeletal: Normal range of motion. She exhibits no tenderness.       No edema  Neurological: She is alert and oriented to person, place, and time. No cranial nerve deficit.  Skin: Skin is warm and dry. No rash noted.  Psychiatric: She has a normal mood and affect. Her behavior is normal.    ED Course  Procedures (including critical care time)  Labs Reviewed  CBC - Abnormal; Notable for the following:    WBC 11.3 (*)    RBC 3.31 (*)    Hemoglobin 9.8 (*)    HCT 29.5 (*)    All other components within normal limits  DIFFERENTIAL - Abnormal; Notable for the following:    Neutrophils Relative 80 (*)    Neutro Abs 9.1 (*)    All other components within normal limits  COMPREHENSIVE METABOLIC PANEL - Abnormal; Notable for the following:    Sodium 124 (*)    Chloride 87 (*)    Glucose, Bld 107 (*)    Albumin 2.9 (*)    Alkaline Phosphatase 169 (*)    Total Bilirubin 0.2 (*)    GFR calc non Af Amer 90 (*)    All other components within normal limits  LIPASE, BLOOD - Abnormal; Notable for the following:    Lipase 69 (*)    All other components within normal limits  URINALYSIS, ROUTINE W REFLEX MICROSCOPIC - Abnormal; Notable for the following:    Leukocytes, UA TRACE (*)    All other components within normal limits  PHENYTOIN LEVEL, TOTAL - Abnormal; Notable for the following:    Phenytoin Lvl <2.5 (*)    All other components within normal limits  URINE MICROSCOPIC-ADD ON - Abnormal; Notable for the following:    Squamous Epithelial / LPF FEW (*)    All other components within normal limits  LACTIC ACID, PLASMA  PHENOBARBITAL LEVEL   Dg Chest 2 View  10/27/2011  *RADIOLOGY REPORT*   Clinical Data: Cough  CHEST - 2 VIEW  Comparison: 10/21/2011  Findings: Heart size is normal.  There is no pleural effusion or pulmonary edema.  No airspace consolidation identified.  Review of the visualized osseous structures is unremarkable.  IMPRESSION:  1.  No active cardiopulmonary  abnormalities.  Original Report Authenticated By: Rosealee Albee, M.D.   US Abdomen Complete  10/27/2011  *RADIOLOGY REPORT*  Clinical Data:  Cholecystitis.  COMPLETE ABDOMINAL ULTRASOUND  Comparison:  10/26/2011.  Findings:  Gallbladder:  No gallstones, gallbladder wall thickening, or pericholecystic fluid.  Common bile duct:  5.3 mm, normal.  Liver:  6 mm simple hepatic cyst identified in the left hepatic lobe.  Otherwise normal.  IVC:  Appears normal.  Pancreas:  No focal abnormality seen.  Spleen:  93 mm.  Normal echotexture.  Right Kidney:  11.4 cm long axis.  Left Kidney:  10.2 cm.  Mild renal atrophy.  15 mm interpolar renal cyst.  Abdominal aorta:  19 mm, normal.  IMPRESSION:  1.  No cholelithiasis or cholecystitis. 2.  Left renal cyst and mild left renal atrophy. 3.  6 mm left hepatic lobe simple cyst.  Original Report Authenticated By: Andreas Newport, M.D.     1. Hyponatremia       MDM   The patient with a complicated next medical history with a recent laryngectomy and she started back on solid foods 6 days ago. 5 days ago she developed vomiting which worsened until yesterday. She had a CAT scan done yesterday which showed that the gallbladder was distended and the cholecystitis cannot be ruled out and had a white blood cell count of 19,000. She was started on Levaquin yesterday and states that her vomiting has improved today. She also has had increased cough with clear mucus and states she may be slightly more short of breath today than prior.  On exam patient has no right upper quadrant tenderness but is moderately tender around her G-tube site. From prior CAT scan there is no sign of abscess or other  abnormalities around the G-tube. She was able to drink some juices and eat today without vomiting and denies being nauseated currently.  Concern for possible respiratory component for patient's symptoms versus gallbladder disease. Ultrasound ordered for further evaluation, CBC, CMP, lactate, UA, chest x-ray pending.   7:00 PM Ultrasound and chest x-ray within normal limits. Labs are all normal except for her sodium which is 124. In the past she has had normal sodiums despite being on phenytoin. Unclear why she is so hyponatremic today. Will admit for further care.     Gwyneth Sprout, MD 10/27/11 1901

## 2011-10-27 NOTE — Telephone Encounter (Addendum)
Mr Baines called back to report if he takes pt from the nursing home, they need an order from [place]. Spoke with Dr [name] and informed Mr Veracruz, we cannot give an order because of liability; we would be liable to admit her. Dr [name] feels she has other problems other than GI; maybe needs a surgical referral, failure to thrive d/t inability to eat, etc.he then stated pt had eaten breakfast this am w/o problems. Pt denies nausea and was not pre medicated with Zofran. She is receiving fluids and her antibiotic as we speak. He reports good skin turgor this am as opposed to yesterday; nurses showed him how to check. Spoke with Cordelia Pen, nurse in charge today and informed her we cannot write the order. She went over what the husband reported again. She will speak with Dr Yetta Flock and we will go from there. Pt was due to be discharged today, but they extended it one more day d/t her vomiting. She did report the g tube was retracted yesterday and today, it is draining. Informed Mr Stroh we will see how she does today and go from there; he stated understanding. 11:50am Cordelia Pen called back after speaking with Dr Yetta Flock and he doesn't feel comfortable waiting, so he will send pt to ER, [facility] for U/S and evaluation; pt requests her husband bring her.

## 2011-10-27 NOTE — ED Notes (Signed)
Per pt's husband-n/v since Sunday-unable to tolerate po's-CT scan showed inflamed gallbladder

## 2011-10-27 NOTE — H&P (Addendum)
PCP:  Illene Regulus, MD, MD   DOA:  10/27/2011  2:09 PM  Chief Complaint:  Nausea and vomiting  HPI: 71 year old female with multiple co morbidities including but not limited to HTN, Peripheral Vascular disease, C.diff colitis and ulcerative colitis on remicade, laryngeal cancer status post laryngectomy who was brought to ED for intractable nausea and vomiting about 10 episodes a day, non bloody, associated with abdominal pain about 6-7/10 in intensity around upper abdomen region. Patient also reports recent productive cough associated with shortness of breath and subjective fever. No complaints of chest pain, no reports of blood in stool.  In ED her lab work up revealed mild leukocytosis and normal chest x ray.  Allergies: No Known Allergies  Prior to Admission medications   Medication Sig Start Date End Date Taking? Authorizing Provider  aspirin 81 MG tablet 81 mg daily. Via g tube    Yes Historical Provider, MD  Mesalamine 4 GM/60ML SF ENEM Place 1 enema rectally 2 (two) times daily.   Yes Historical Provider, MD  Nutritional Supplements (GLUCERNA 1.5 CAL PO) every 4 hours with 200 ml of water flush   Yes Historical Provider, MD  PHENobarbital (LUMINAL) 97.2 MG tablet 97.2 mg by Gastrostomy Tube route 2 (two) times daily.  04/22/11  Yes Jacques Navy, MD  phenytoin (DILANTIN) 100 MG ER capsule 4 mls three times a day via g tube   Yes Historical Provider, MD    Past Medical History  Diagnosis Date  . Peripheral vascular disease   . Subdural hematoma   . Ataxia   . Mild dysplasia of cervix   . UTI (lower urinary tract infection)   . Urinary incontinence   . Seizures   . Cerebrovascular disease   . Hypothyroidism   . Personal history of colonic polyps     adenomatous 1997 & tubular adenoma and hyplastic  2008  . Hyperlipidemia   . Adenocarcinoma, breast     bilateral  . H/O alcohol abuse   . Diverticulosis of colon (without mention of hemorrhage)   . Redundant  colon   . Squamous cell carcinoma of mouth   . Stroke   . C. difficile colitis   . Dementia t  . Ulcerative colitis     Past Surgical History  Procedure Date  . Subdural hematoma evacuation via craniotomy   . 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 Multiple tooth extractions     due to oral cancer  . Cataract extraction, bilateral   . Breast lumpectomy     left breast with radiation therapy  . Carotid endarterectomy     left  . Mastectomy     Right, history with nodule dissection  . Orif hip fracture Sept '12    Right hip: screw and plate repair.  . [facility] surgery 08/2010    [facility]    Social History:  reports that she quit smoking about 7 years ago. She has never used smokeless tobacco. She reports that she does not drink alcohol or use illicit drugs.  Family History  Problem Relation Age of Onset  . Coronary artery disease Mother   . Heart disease Mother   . Diabetes Sister   . Breast cancer Maternal Aunt   . Breast cancer Sister     Review of Systems:  Constitutional: subjective  fever, no chills, diaphoresis, appetite change and fatigue.  HEENT: Denies photophobia, eye pain, redness, hearing loss, ear pain, congestion, sore throat,  rhinorrhea, sneezing, mouth sores, trouble swallowing, neck pain, neck stiffness and tinnitus.   Respiratory: (+) SOB, no DOE, (+) cough, (-) chest tightness,  and wheezing.   Cardiovascular: Denies chest pain, palpitations and leg swelling.  Gastrointestinal: per HPI  Genitourinary: Denies dysuria, urgency, frequency, hematuria, flank pain and difficulty urinating.  Musculoskeletal: Denies myalgias, back pain, joint swelling, arthralgias and gait problem.  Skin: Denies pallor, rash and wound.  Neurological: Denies dizziness, seizures, syncope, weakness, light-headedness, numbness and headaches.  Hematological: Denies adenopathy. Easy bruising, personal or family bleeding history  Psychiatric/Behavioral: Denies  suicidal ideation, mood changes, confusion, nervousness, sleep disturbance and agitation   Physical Exam:  Filed Vitals:   10/27/11 1420 10/27/11 1533 10/27/11 1652 10/27/11 1856  BP: 112/56 124/44    Pulse: 78 81    Temp: 98.3 F (36.8 C) 98.6 F (37 C) 98.7 F (37.1 C) 98.9 F (37.2 C)  TempSrc: Oral Oral Oral Oral  Resp: 16 22    SpO2: 98% 94%      Constitutional: Vital signs reviewed.  Patient is in no acute distress and cooperative with exam. Alert and oriented x3.  Head: Normocephalic and atraumatic Ear: TM normal bilaterally Mouth: no erythema or exudates, MMM Eyes: PERRL, EOMI, conjunctivae normal, No scleral icterus.  Neck: Supple, Trachea midline normal ROM, No JVD, mass, thyromegaly, or carotid bruit present.  Cardiovascular: RRR, S1 normal, S2 normal, no MRG, pulses symmetric and intact bilaterally Pulmonary/Chest: CTAB, no wheezes, rales, or rhonchi Abdominal: Soft. G tube with very mild surrounding erythema but no drainage  GU: no CVA tenderness Musculoskeletal: No joint deformities, erythema, or stiffness, ROM full and no nontender Ext: no edema and no cyanosis, pulses palpable bilaterally (DP and PT) Hematology: no cervical, inginal, or axillary adenopathy.  Neurological: A&O x3, Strenght is normal and symmetric bilaterally, cranial nerve II-XII are grossly intact, no focal motor deficit, sensory intact to light touch bilaterally.  Skin: Warm, dry and intact. No rash, cyanosis, or clubbing.  Psychiatric: Normal mood and affect. speech and behavior is normal. Judgment and thought content normal. Cognition and memory are normal.   Labs on Admission:  Results for orders placed during the hospital encounter of 10/27/11 (from the past 48 hour(s))  CBC     Status: Abnormal   Collection Time   10/27/11  4:35 PM      Component Value Range Comment   WBC 11.3 (*) 4.0 - 10.5 (K/uL)    RBC 3.31 (*) 3.87 - 5.11 (MIL/uL)    Hemoglobin 9.8 (*) 12.0 - 15.0 (g/dL)    HCT  16.1 (*) 09.6 - 46.0 (%)    MCV 89.1  78.0 - 100.0 (fL)    MCH 29.6  26.0 - 34.0 (pg)    MCHC 33.2  30.0 - 36.0 (g/dL)    RDW 04.5  40.9 - 81.1 (%)    Platelets 248  150 - 400 (K/uL)   DIFFERENTIAL     Status: Abnormal   Collection Time   10/27/11  4:35 PM      Component Value Range Comment   Neutrophils Relative 80 (*) 43 - 77 (%)    Neutro Abs 9.1 (*) 1.7 - 7.7 (K/uL)    Lymphocytes Relative 12  12 - 46 (%)    Lymphs Abs 1.4  0.7 - 4.0 (K/uL)    Monocytes Relative 7  3 - 12 (%)    Monocytes Absolute 0.8  0.1 - 1.0 (K/uL)    Eosinophils Relative 0  0 -  5 (%)    Eosinophils Absolute 0.0  0.0 - 0.7 (K/uL)    Basophils Relative 0  0 - 1 (%)    Basophils Absolute 0.0  0.0 - 0.1 (K/uL)   COMPREHENSIVE METABOLIC PANEL     Status: Abnormal   Collection Time   10/27/11  4:35 PM      Component Value Range Comment   Sodium 124 (*) 135 - 145 (mEq/L)    Potassium 3.5  3.5 - 5.1 (mEq/L)    Chloride 87 (*) 96 - 112 (mEq/L)    CO2 28  19 - 32 (mEq/L)    Glucose, Bld 107 (*) 70 - 99 (mg/dL)    BUN 9  6 - 23 (mg/dL)    Creatinine, Ser 4.09  0.50 - 1.10 (mg/dL)    Calcium 8.7  8.4 - 10.5 (mg/dL)    Total Protein 7.3  6.0 - 8.3 (g/dL)    Albumin 2.9 (*) 3.5 - 5.2 (g/dL)    AST 19  0 - 37 (U/L)    ALT 10  0 - 35 (U/L)    Alkaline Phosphatase 169 (*) 39 - 117 (U/L)    Total Bilirubin 0.2 (*) 0.3 - 1.2 (mg/dL)    GFR calc non Af Amer 90 (*) >90 (mL/min)    GFR calc Af Amer >90  >90 (mL/min)   LIPASE, BLOOD     Status: Abnormal   Collection Time   10/27/11  4:35 PM      Component Value Range Comment   Lipase 69 (*) 11 - 59 (U/L)   LACTIC ACID, PLASMA     Status: Normal   Collection Time   10/27/11  4:35 PM      Component Value Range Comment   Lactic Acid, Venous 0.8  0.5 - 2.2 (mmol/L)   PHENYTOIN LEVEL, TOTAL     Status: Abnormal   Collection Time   10/27/11  4:35 PM      Component Value Range Comment   Phenytoin Lvl <2.5 (*) 10.0 - 20.0 (ug/mL)   PHENOBARBITAL LEVEL     Status: Normal     Collection Time   10/27/11  4:35 PM      Component Value Range Comment   Phenobarbital 33.4  15.0 - 40.0 (ug/mL)   URINALYSIS, ROUTINE W REFLEX MICROSCOPIC     Status: Abnormal   Collection Time   10/27/11  4:52 PM      Component Value Range Comment   Color, Urine YELLOW  YELLOW     APPearance CLEAR  CLEAR     Specific Gravity, Urine 1.011  1.005 - 1.030     pH 6.0  5.0 - 8.0     Glucose, UA NEGATIVE  NEGATIVE (mg/dL)    Hgb urine dipstick NEGATIVE  NEGATIVE     Bilirubin Urine NEGATIVE  NEGATIVE     Ketones, ur NEGATIVE  NEGATIVE (mg/dL)    Protein, ur NEGATIVE  NEGATIVE (mg/dL)    Urobilinogen, UA 0.2  0.0 - 1.0 (mg/dL)    Nitrite NEGATIVE  NEGATIVE     Leukocytes, UA TRACE (*) NEGATIVE    URINE MICROSCOPIC-ADD ON     Status: Abnormal   Collection Time   10/27/11  4:52 PM      Component Value Range Comment   Squamous Epithelial / LPF FEW (*) RARE     WBC, UA 0-2  <3 (WBC/hpf)     Radiological Exams on Admission: No results found.  Assessment/Plan  Active Problems:  Nausea and vomiting - perhaps viral gastroenteritis versus possible cholecystitis - ultrasound done on admission is negative but since she does have some pain around the upper abdomen and with leukocytosis and subjective fever perhaps we can start cipro IV - patient's lipase is slightly elevated at 64; obtain CT abdomen/pelvis - keep NPO and advance diet as tolerated - follow up CBC in am - provide IV fluids - provide antiemetics and analgesia with morphine PRN - vomiting compromises breathing given the fact pt has laryngeal tube - will need perhaps ENT consult in AM, pt is currently hemodynamically stable and I called   Hyponatremia - unclear etiology, possible etiology includes malignancy/SIADH - provide IV fluids with NS - obtain TSH level and pro BNP level - follow up BMP in am - no acute changes in mental status  Anemia - likely secondary to chronic disease - hemoglobin stable on admission -  follow up CBC in am   DVT Prophylaxis - SCD bilaterally  Code Status - full code  Education  - test results and diagnostic studies were discussed with patient and pt's family who was present at the bedside - patient and family have verbalized the understanding - questions were answered at the bedside and contact information was provided for additional questions or concerns  Time Spent on Admission: Over 30 minutes  Sutter Ahlgren 10/27/2011, 7:22 PM  [facility] Pager # [phone] Main Office # [phone]

## 2011-10-27 NOTE — H&P (Deleted)
PCP:  Illene Regulus, MD, MD   DOA:  10/27/2011  2:09 PM  Chief Complaint:  Nausea and vomiting  HPI: 71 year old female with multiple co morbidities including but not limited to HTN, Peripheral Vascular disease, C.diff colitis and ulcerative colitis on remicade, laryngeal cancer status post laryngectomy who was brought to ED for intractable nausea and vomiting  71 y.o. female presenting with vomiting. The history is provided by the patient and the spouse.  Emesis  This is a new problem. Episode onset: 6 days ago. The problem occurs 5 to 10 times per day. The problem has been gradually improving. The emesis has an appearance of stomach contents. There has been no fever. Associated symptoms include abdominal pain and cough. Pertinent negatives include no chills, no diarrhea and no fever. Associated symptoms comments: Pain around her G-tube site which is approximately 6 weeks postop. Risk factors: Recently restarted on solid foods after having a laryngectomy.   started back on solid foods 6 days ago. 5 days ago she developed vomiting which worsened until yesterday. She had a CAT scan done yesterday which showed that the gallbladder was distended and the cholecystitis cannot be ruled out and had a white blood cell count of 19,000. She was started on Levaquin yesterday and states that her vomiting has improved today. She also has had increased cough with clear mucus and states she may be slightly more short of breath today than prior.  71 year old female with a complicated past medical history including but not limited to hypertension, hyperlipidemia, peripheral vascular disease, C. difficile colitis, ulcerative colitis on Remicade for the past 3 months, and laryngeal cancer status post resection recently who is seen in followup. She is accompanied today by her husband, and she is currently living in a rehabilitation facility in [facility]. Her history of late has been complicated and includes a  three-week hospitalization at [facility] for laryngectomy and tumor removal. During that time she had worsening of her colitis manifested by bloody diarrhea. At this time it was discovered that she was off of her mesalamine preparation. After her recent surgery, a G-tube was placed in all of her medicines have been delivered via tube. She is taking nothing by mouth. She is due for Remicade infusion today.    Allergies: No Known Allergies  Prior to Admission medications   Medication Sig Start Date End Date Taking? Authorizing Provider  aspirin 81 MG tablet 81 mg daily. Via g tube    Yes Historical Provider, MD  Mesalamine 4 GM/60ML SF ENEM Place 1 enema rectally 2 (two) times daily.   Yes Historical Provider, MD  Nutritional Supplements (GLUCERNA 1.5 CAL PO) every 4 hours with 200 ml of water flush   Yes Historical Provider, MD  PHENobarbital (LUMINAL) 97.2 MG tablet 97.2 mg by Gastrostomy Tube route 2 (two) times daily.  04/22/11  Yes Jacques Navy, MD  phenytoin (DILANTIN) 100 MG ER capsule 4 mls three times a day via g tube   Yes Historical Provider, MD    Past Medical History  Diagnosis Date  . Peripheral vascular disease   . Subdural hematoma   . Ataxia   . Mild dysplasia of cervix   . UTI (lower urinary tract infection)   . Urinary incontinence   . Seizures   . Cerebrovascular disease   . Hypothyroidism   . Personal history of colonic polyps     adenomatous 1997 & tubular adenoma and hyplastic  2008  . Hyperlipidemia   .  Adenocarcinoma, breast     bilateral  . H/O alcohol abuse   . Diverticulosis of colon (without mention of hemorrhage)   . Redundant colon   . Squamous cell carcinoma of mouth   . Stroke   . C. difficile colitis   . Dementia t  . Ulcerative colitis     Past Surgical History  Procedure Date  . Subdural hematoma evacuation via craniotomy   . 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 Multiple tooth extractions     due to  oral cancer  . Cataract extraction, bilateral   . Breast lumpectomy     left breast with radiation therapy  . Carotid endarterectomy     left  . Mastectomy     Right, history with nodule dissection  . Orif hip fracture Sept '12    Right hip: screw and plate repair.  . [facility] surgery 08/2010    [facility]    Social History:  reports that she quit smoking about 7 years ago. She has never used smokeless tobacco. She reports that she does not drink alcohol or use illicit drugs.  Family History  Problem Relation Age of Onset  . Coronary artery disease Mother   . Heart disease Mother   . Diabetes Sister   . Breast cancer Maternal Aunt   . Breast cancer Sister     Review of Systems:  Constitutional: Denies fever, chills, diaphoresis, appetite change and fatigue.  HEENT: Denies photophobia, eye pain, redness, hearing loss, ear pain, congestion, sore throat, rhinorrhea, sneezing, mouth sores, trouble swallowing, neck pain, neck stiffness and tinnitus.   Respiratory: Denies SOB, DOE, cough, chest tightness,  and wheezing.   Cardiovascular: Denies chest pain, palpitations and leg swelling.  Gastrointestinal: Denies nausea, vomiting, abdominal pain, diarrhea, constipation, blood in stool and abdominal distention.  Genitourinary: Denies dysuria, urgency, frequency, hematuria, flank pain and difficulty urinating.  Musculoskeletal: Denies myalgias, back pain, joint swelling, arthralgias and gait problem.  Skin: Denies pallor, rash and wound.  Neurological: Denies dizziness, seizures, syncope, weakness, light-headedness, numbness and headaches.  Hematological: Denies adenopathy. Easy bruising, personal or family bleeding history  Psychiatric/Behavioral: Denies suicidal ideation, mood changes, confusion, nervousness, sleep disturbance and agitation   Physical Exam:  Filed Vitals:   10/27/11 1533 10/27/11 1652 10/27/11 1856 10/27/11 1932  BP: 124/44   162/68  Pulse: 81   80  Temp: 98.6 F (37  C) 98.7 F (37.1 C) 98.9 F (37.2 C) 98.7 F (37.1 C)  TempSrc: Oral Oral Oral Oral  Resp: 22   16  SpO2: 94%   93%    Constitutional: Vital signs reviewed.  Patient is in no acute distress and cooperative with exam. Alert and oriented x3.  Head: Normocephalic and atraumatic Ear: TM normal bilaterally Mouth: no erythema or exudates, MMM Eyes: PERRL, EOMI, conjunctivae normal, No scleral icterus.  Neck: Supple, Trachea midline normal ROM, No JVD, mass, thyromegaly, or carotid bruit present.  Cardiovascular: RRR, S1 normal, S2 normal, no MRG, pulses symmetric and intact bilaterally Pulmonary/Chest: CTAB, no wheezes, rales, or rhonchi Abdominal: Soft. Non-tender, non-distended, bowel sounds are normal, no masses, organomegaly, or guarding present.  GU: no CVA tenderness Musculoskeletal: No joint deformities, erythema, or stiffness, ROM full and no nontender Ext: no edema and no cyanosis, pulses palpable bilaterally (DP and PT) Hematology: no cervical, inginal, or axillary adenopathy.  Neurological: A&O x3, Strenght is normal and symmetric bilaterally, cranial nerve II-XII are grossly intact, no focal motor deficit, sensory intact  to light touch bilaterally.  Skin: Warm, dry and intact. No rash, cyanosis, or clubbing.  Psychiatric: Normal mood and affect. speech and behavior is normal. Judgment and thought content normal. Cognition and memory are normal.   Labs on Admission:  Results for orders placed during the hospital encounter of 10/27/11 (from the past 48 hour(s))  CBC     Status: Abnormal   Collection Time   10/27/11  4:35 PM      Component Value Range Comment   WBC 11.3 (*) 4.0 - 10.5 (K/uL)    RBC 3.31 (*) 3.87 - 5.11 (MIL/uL)    Hemoglobin 9.8 (*) 12.0 - 15.0 (g/dL)    HCT 16.1 (*) 09.6 - 46.0 (%)    MCV 89.1  78.0 - 100.0 (fL)    MCH 29.6  26.0 - 34.0 (pg)    MCHC 33.2  30.0 - 36.0 (g/dL)    RDW 04.5  40.9 - 81.1 (%)    Platelets 248  150 - 400 (K/uL)   DIFFERENTIAL      Status: Abnormal   Collection Time   10/27/11  4:35 PM      Component Value Range Comment   Neutrophils Relative 80 (*) 43 - 77 (%)    Neutro Abs 9.1 (*) 1.7 - 7.7 (K/uL)    Lymphocytes Relative 12  12 - 46 (%)    Lymphs Abs 1.4  0.7 - 4.0 (K/uL)    Monocytes Relative 7  3 - 12 (%)    Monocytes Absolute 0.8  0.1 - 1.0 (K/uL)    Eosinophils Relative 0  0 - 5 (%)    Eosinophils Absolute 0.0  0.0 - 0.7 (K/uL)    Basophils Relative 0  0 - 1 (%)    Basophils Absolute 0.0  0.0 - 0.1 (K/uL)   COMPREHENSIVE METABOLIC PANEL     Status: Abnormal   Collection Time   10/27/11  4:35 PM      Component Value Range Comment   Sodium 124 (*) 135 - 145 (mEq/L)    Potassium 3.5  3.5 - 5.1 (mEq/L)    Chloride 87 (*) 96 - 112 (mEq/L)    CO2 28  19 - 32 (mEq/L)    Glucose, Bld 107 (*) 70 - 99 (mg/dL)    BUN 9  6 - 23 (mg/dL)    Creatinine, Ser 9.14  0.50 - 1.10 (mg/dL)    Calcium 8.7  8.4 - 10.5 (mg/dL)    Total Protein 7.3  6.0 - 8.3 (g/dL)    Albumin 2.9 (*) 3.5 - 5.2 (g/dL)    AST 19  0 - 37 (U/L)    ALT 10  0 - 35 (U/L)    Alkaline Phosphatase 169 (*) 39 - 117 (U/L)    Total Bilirubin 0.2 (*) 0.3 - 1.2 (mg/dL)    GFR calc non Af Amer 90 (*) >90 (mL/min)    GFR calc Af Amer >90  >90 (mL/min)   LIPASE, BLOOD     Status: Abnormal   Collection Time   10/27/11  4:35 PM      Component Value Range Comment   Lipase 69 (*) 11 - 59 (U/L)   LACTIC ACID, PLASMA     Status: Normal   Collection Time   10/27/11  4:35 PM      Component Value Range Comment   Lactic Acid, Venous 0.8  0.5 - 2.2 (mmol/L)   PHENYTOIN LEVEL, TOTAL     Status: Abnormal  Collection Time   10/27/11  4:35 PM      Component Value Range Comment   Phenytoin Lvl <2.5 (*) 10.0 - 20.0 (ug/mL)   PHENOBARBITAL LEVEL     Status: Normal   Collection Time   10/27/11  4:35 PM      Component Value Range Comment   Phenobarbital 33.4  15.0 - 40.0 (ug/mL)   URINALYSIS, ROUTINE W REFLEX MICROSCOPIC     Status: Abnormal   Collection Time    10/27/11  4:52 PM      Component Value Range Comment   Color, Urine YELLOW  YELLOW     APPearance CLEAR  CLEAR     Specific Gravity, Urine 1.011  1.005 - 1.030     pH 6.0  5.0 - 8.0     Glucose, UA NEGATIVE  NEGATIVE (mg/dL)    Hgb urine dipstick NEGATIVE  NEGATIVE     Bilirubin Urine NEGATIVE  NEGATIVE     Ketones, ur NEGATIVE  NEGATIVE (mg/dL)    Protein, ur NEGATIVE  NEGATIVE (mg/dL)    Urobilinogen, UA 0.2  0.0 - 1.0 (mg/dL)    Nitrite NEGATIVE  NEGATIVE     Leukocytes, UA TRACE (*) NEGATIVE    URINE MICROSCOPIC-ADD ON     Status: Abnormal   Collection Time   10/27/11  4:52 PM      Component Value Range Comment   Squamous Epithelial / LPF FEW (*) RARE     WBC, UA 0-2  <3 (WBC/hpf)     Radiological Exams on Admission: No results found.  Assessment/Plan  Active Problems:    DVT Prophylaxis - SCD bilaterally  Code Status - full code  Education  - test results and diagnostic studies were discussed with patient and pt's family who was present at the bedside - patient and family have verbalized the understanding - questions were answered at the bedside and contact information was provided for additional questions or concerns  Time Spent on Admission: Over 30 minutes  MAGICK-Riggin Cuttino 10/27/2011, 7:53 PM  [facility] Pager # [phone] Main Office # [phone]

## 2011-10-28 ENCOUNTER — Inpatient Hospital Stay (HOSPITAL_COMMUNITY): Payer: Medicare Other

## 2011-10-28 DIAGNOSIS — R112 Nausea with vomiting, unspecified: Secondary | ICD-10-CM

## 2011-10-28 DIAGNOSIS — K5190 Ulcerative colitis, unspecified, without complications: Secondary | ICD-10-CM

## 2011-10-28 DIAGNOSIS — R109 Unspecified abdominal pain: Secondary | ICD-10-CM

## 2011-10-28 DIAGNOSIS — R1013 Epigastric pain: Secondary | ICD-10-CM

## 2011-10-28 DIAGNOSIS — E871 Hypo-osmolality and hyponatremia: Secondary | ICD-10-CM

## 2011-10-28 LAB — CBC
Platelets: 271 10*3/uL (ref 150–400)
RBC: 3.57 MIL/uL — ABNORMAL LOW (ref 3.87–5.11)
RDW: 14.9 % (ref 11.5–15.5)
WBC: 10.2 10*3/uL (ref 4.0–10.5)

## 2011-10-28 LAB — URINALYSIS, ROUTINE W REFLEX MICROSCOPIC
Bilirubin Urine: NEGATIVE
Glucose, UA: NEGATIVE mg/dL
Ketones, ur: NEGATIVE mg/dL
Leukocytes, UA: NEGATIVE
Protein, ur: NEGATIVE mg/dL

## 2011-10-28 LAB — TSH: TSH: 2.594 u[IU]/mL (ref 0.350–4.500)

## 2011-10-28 LAB — BLOOD GAS, ARTERIAL
Acid-Base Excess: 3.8 mmol/L — ABNORMAL HIGH (ref 0.0–2.0)
Drawn by: 24513
FIO2: 0.21 %
O2 Saturation: 87.3 %
TCO2: 25.6 mmol/L (ref 0–100)

## 2011-10-28 LAB — COMPREHENSIVE METABOLIC PANEL
Albumin: 2.9 g/dL — ABNORMAL LOW (ref 3.5–5.2)
BUN: 6 mg/dL (ref 6–23)
BUN: 5 mg/dL — ABNORMAL LOW (ref 6–23)
Calcium: 8.3 mg/dL — ABNORMAL LOW (ref 8.4–10.5)
Chloride: 95 mEq/L — ABNORMAL LOW (ref 96–112)
Creatinine, Ser: 0.52 mg/dL (ref 0.50–1.10)
GFR calc Af Amer: >90 mL/min (ref >90)
GFR calc Af Amer: >90 mL/min (ref >90)
GFR calc non Af Amer: >90 mL/min (ref >90)
Glucose, Bld: 122 mg/dL — ABNORMAL HIGH (ref 70–99)
Glucose, Bld: 100 mg/dL — ABNORMAL HIGH (ref 70–99)
Total Bilirubin: 0.2 mg/dL — ABNORMAL LOW (ref 0.3–1.2)
Total Protein: 6.9 g/dL (ref 6.0–8.3)

## 2011-10-28 LAB — MRSA PCR SCREENING: MRSA by PCR: NEGATIVE

## 2011-10-28 LAB — MAGNESIUM: Magnesium: 1.9 mg/dL (ref 1.5–2.5)

## 2011-10-28 LAB — PHENYTOIN LEVEL, TOTAL: Phenytoin Lvl: <2.5 ug/mL — ABNORMAL LOW (ref 10.0–20.0)

## 2011-10-28 NOTE — Procedures (Signed)
Gastrotomy tube was removed percutanously and replaced with a #24Fr "button" gastrostomy.

## 2011-10-28 NOTE — Progress Notes (Signed)
CARE MANAGEMENT NOTE 10/28/2011  Patient:  [name], [name]   Account Number:  [id number]  Date Initiated:  10/28/2011  Documentation initiated by:  Leibish Mcgregor  Subjective/Objective Assessment:   pt with complicated medical history presents with nausea and vomoting, unable to control , admitted to sdu overnight for airway -larnygectomy protection.     Action/Plan:   lives at home   Anticipated DC Date:  10/31/2011   Anticipated DC Plan:  HOME/SELF CARE  In-house referral  NA      DC Planning Services  NA      [facility] Choice  NA   Choice offered to / List presented to:  NA   DME arranged  NA      DME agency  NA     HH arranged  NA      HH agency  NA   Status of service:  In process, will continue to follow Medicare Important Message given?   (If response is "NO", the following Medicare IM given date fields will be blank) Date Medicare IM given:   Date Additional Medicare IM given:    Discharge Disposition:    Per UR Regulation:  Reviewed for med. necessity/level of care/duration of stay  If discussed at Long Length of Stay Meetings, dates discussed:    Comments:  [id number]/Laini Urick Lorrin Mais Case Management [phone]

## 2011-10-28 NOTE — Progress Notes (Signed)
I called Dr. Arthur Holms office and was made aware Hospitalist is on call for his patients.  I sent Dr. Ardyth Harps a page to make her aware patient is in atrial fibrillation. Awaiting call back.

## 2011-10-28 NOTE — Progress Notes (Signed)
INITIAL ADULT NUTRITION ASSESSMENT Date: 10/28/2011   Time: 1:35 PM Reason for Assessment: Nutrition Risk for Weight Loss, dysphagia, feeding tube but does not use.  ASSESSMENT: Female 71 y.o.  Dx: Nausea & vomiting, ulcerative colitis, anemia, hypokalemia, hyponatremia  Hx:  Past Medical History  Diagnosis Date  . Peripheral vascular disease   . Subdural hematoma   . Ataxia   . Mild dysplasia of cervix   . UTI (lower urinary tract infection)   . Urinary incontinence   . Seizures   . Cerebrovascular disease   . Hypothyroidism   . Personal history of colonic polyps     adenomatous 1997 & tubular adenoma and hyplastic  2008  . Hyperlipidemia   . Adenocarcinoma, breast     bilateral  . H/O alcohol abuse   . Diverticulosis of colon (without mention of hemorrhage)   . Redundant colon   . Squamous cell carcinoma of mouth   . Stroke   . C. difficile colitis   . Dementia t  . Ulcerative colitis    Past Surgical History  Procedure Date  . Subdural hematoma evacuation via craniotomy   . 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 Multiple tooth extractions     due to oral cancer  . Cataract extraction, bilateral   . Breast lumpectomy     left breast with radiation therapy  . Carotid endarterectomy     left  . Mastectomy     Right, history with nodule dissection  . Orif hip fracture Sept '12    Right hip: screw and plate repair.  . Larynx surgery 08/2010    [facility]   Laryngectomy March 2013 at [facility] PEG tube-pt not using at this time considering d/c soon  Related Meds: lialda, rowasa, lumnal, dilantin, asparin  Ht: 5\' 6"  (167.6 cm)  Wt: 111 lb 1.8 oz (50.4 kg)  Ideal Wt: 59.3 kg  % Ideal Wt: 85  Usual Wt: 120+ per pt Wt Readings from Last 3 Encounters:  10/28/11 111 lb 1.8 oz (50.4 kg)  09/28/11 116 lb 6.4 oz (52.799 kg)  08/18/11 117 lb (53.071 kg)     % Usual Wt: 92  Body mass index is 17.93 kg/(m^2).  Food/Nutrition Related Hx: Pt  reports eating well.  Improving.  Does not use PEG tube.  Got Ensure once at rehab.  5% weight loss in the last month.  8% weight los in the last 3 months.  Soft diet.  Labs:  CMP     Component Value Date/Time   NA 133* 10/28/2011 0440   K 3.2* 10/28/2011 0440   CL 95* 10/28/2011 0440   CO2 29 10/28/2011 0440   GLUCOSE 100* 10/28/2011 0440   BUN 5* 10/28/2011 0440   CREATININE 0.52 10/28/2011 0440   CALCIUM 8.4 10/28/2011 0440   PROT 7.2 10/28/2011 0440   ALBUMIN 2.9* 10/28/2011 0440   AST 16 10/28/2011 0440   ALT 9 10/28/2011 0440   ALKPHOS 175* 10/28/2011 0440   BILITOT 0.2* 10/28/2011 0440   GFRNONAA >90 10/28/2011 0440   GFRAA >90 10/28/2011 0440    I/O last 3 completed shifts: In: 710 [I.V.:450; Other:60; IV Piggyback:200] Out: 901 [Urine:900; Stool:1] Total I/O In: -  Out: 175 [Urine:175]   Diet Order: Dysphagia 3 thin-  Currently eating lunch with very good intake.  Tolerating well.  Supplements/Tube Feeding:none  IVF:    sodium chloride Last Rate: 75 mL/hr at 10/28/11 1140    Estimated Nutritional Needs:  Kcal: 1300-1400 Protein: 65-75 Fluid: >1.3L  NUTRITION DIAGNOSIS: -Malnutrition (NI-5.2).  Status: Ongoing -Swallowing difficulty (NI-1.1).  Status: Ongoing- doing well with soft foods. -Underweight (NI-3.1).  Status: Ongoing  RELATED TO: Mouth Cancer  AS EVIDENCE BY: Weight loss of 5 % in the last month, 8% in the last 3 months.  MONITORING/EVALUATION(Goals): Monitor:  Intake, weight , labs Goal:  Meet 100% estimated needs with po intake.  EDUCATION NEEDS: -No education needs identified at this time  INTERVENTION: Continue current soft diet and provide preferences. Encouraged intake. Monitor for supplement needs.  Dietitian [phone]  DOCUMENTATION CODES Per approved criteria  -Non-severe (moderate) malnutrition in the context of chronic illness -Underweight  * weight loss of 5% in the past month, decreased fat and muscle mass.  Jeoffrey Massed 10/28/2011, 1:35 PM

## 2011-10-28 NOTE — Progress Notes (Signed)
Late Entry  Pt. was admitted to floor around 2140pm on 10/27/11.  Patient was vomiting upon arrival, which continued violently until transfer to stepdown.  Pt. Has laryngeal tube that was placed several months ago (we do not carry supplies here and per respiratory we need a ENT consult in order to clean and care for this type of airway).  Pt. Continued to violently vomit in addition to become very short of breath.  She became dyspneic and unstable, mucous began to build up around the stoma and Pt. Had difficulty clearing airway.  MD arrived and decided to transfer patient to stepdown unit.  ICU was notified and patient immediately rushed to the unit.  Patient's husband was called and I left a number on his answering machine to call me back so that I could inform him of the transfer.  Thanks, Will continue to monitor.Kenton Kingfisher [place]

## 2011-10-28 NOTE — Consult Note (Signed)
Chart was reviewed and patient was examined. X-rays were reviewed, including the CT from [place] 2 days ago.  This demonstrates creation of her gastrostomy tube into the duodenum. The common bile duct is mildly  dilated to 7 mm.  Impression #1 ulcerative colitis. It does appear that she is terribly symptomatic from this. #2 abdominal pain - etiology is not certain. There is no acute process in the abdomen as turned by CT. #3 nausea and vomiting-this could be related to migration of the gastrostomy tube into the duodenum which could cause a functional obstruction #4 LFTs are cholestatic and there is mild dilatation of the common bile duct these 2 findings may be unrelated but require followup  Recommendations #1 resume lialda and Rowasa enemas. I would hold Remicade until infection possibly is sorted out #2 reposition gastrostomy tube into the stomach  #3 followup LFTs; if cholestasis worsens I would consider MRCP    Barbette Hair. Arlyce Dice, M.D., [facility]

## 2011-10-28 NOTE — Progress Notes (Signed)
PT Cancellation Note  Treatment cancelled today due to medical issues with patient which prohibited therapy RN reports pt has had some agitation this morning.  Will check back another time.  Ebony Hail [facility] 10/28/2011, 10:00 AM

## 2011-10-28 NOTE — Consult Note (Signed)
Referring ProviderDr. Debby Bud Primary Care Physician:  Illene Regulus, MD, MD Primary Gastroenterologist:  Dr.Pyrtle  Reason for Consultation:  Abdominal pain, nausea vomiting and history of ulcerative colitis  HPI: [name] is a 71 y.o. 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 She is known recently to Dr. [name] from a GI standpoint with history of ulcerative colitis and also prior C. difficile. She had been managed with a combination of mesalamines and had been started on Remicade in January of 2013, however this was stopped in March of 2013 because of her recent surgery and multiple other issues.  She has been recuperating at a rehabilitation facility and has been doing fairly well until this past week. She still has a PEG in place but has not been using it and has been able to swallow regular soft foods without difficulty. Apparently she has had complaints of abdominal pain some urgency and blood in her stools and over the past few days developed nausea and vomiting which had become intractable. She was admitted here yesterday and is feeling much better today.  Chest x-ray on admission the was unremarkable, and upper abdominal ultrasound is negative. Patient has had a CT scan of the abdomen and pelvis this morning with results pending. Interestingly she has not had any vomiting since admission and complains only of mild abdominal soreness at this time  She was hyponatremic on admission with a sodium of 124, this has improved to 133 today there has mild hypokalemia. She is anemic but has had no overt evidence of GI bleeding. We are asked to assist with management of her colitis.   Past Medical History    Diagnosis Date  . Peripheral vascular disease   . Subdural hematoma   . Ataxia   . Mild dysplasia of cervix   . UTI (lower urinary tract infection)   . Urinary incontinence   . Seizures   . Cerebrovascular disease   . Hypothyroidism   . Personal history of colonic polyps     adenomatous 1997 & tubular adenoma and hyplastic  2008  . Hyperlipidemia   . Adenocarcinoma, breast     bilateral  . H/O alcohol abuse   . Diverticulosis of colon (without mention of hemorrhage)   . Redundant colon   . Squamous cell carcinoma of mouth   . Stroke   . C. difficile colitis   . Dementia t  . Ulcerative colitis     Past Surgical History  Procedure Date  . Subdural hematoma evacuation via craniotomy   . 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 Multiple tooth extractions     due to oral cancer  . Cataract extraction, bilateral   . Breast lumpectomy     left breast with radiation therapy  . Carotid endarterectomy     left  . Mastectomy     Right, history with nodule dissection  . Orif hip fracture Sept '12    Right hip: screw and plate repair.  . Larynx surgery 08/2010    [facility]    Prior to Admission medications   Medication Sig Start Date End Date Taking? Authorizing Provider  aspirin 81 MG tablet 81 mg daily. Via g tube    Yes Historical Provider, MD  Mesalamine 4 GM/60ML SF ENEM Place 1 enema rectally 2 (two) times daily.   Yes Historical Provider, MD  Nutritional Supplements (GLUCERNA 1.5 CAL PO) every 4 hours with 200 ml of water flush   Yes Historical Provider, MD  PHENobarbital (LUMINAL) 97.2 MG tablet 97.2 mg by Gastrostomy Tube route 2 (two) times daily.  04/22/11  Yes Jacques Navy, MD  phenytoin (DILANTIN) 100 MG ER capsule 4 mls three times a day via g tube   Yes Historical Provider, MD    Current Facility-Administered Medications  Medication Dose Route Frequency Provider Last Rate Last Dose  . 0.9 %  sodium chloride infusion   Intravenous  STAT Gwyneth Sprout, MD      . 0.9 %  sodium chloride infusion   Intravenous Continuous Alison Murray, MD 75 mL/hr at 10/28/11 1140    . albuterol (PROVENTIL) (5 MG/ML) 0.5% nebulizer solution 2.5 mg  2.5 mg Nebulization Q2H PRN Alison Murray, MD      . aspirin chewable tablet 81 mg  81 mg Per Tube Daily Henderson Cloud, MD   81 mg at 10/28/11 1030  . hydrALAZINE (APRESOLINE) injection 10 mg  10 mg Intravenous Q6H PRN Alison Murray, MD      . HYDROcodone-acetaminophen [facility]) 5-325 MG per tablet 1-2 tablet  1-2 tablet Oral Q4H PRN Alison Murray, MD      . lidocaine (XYLOCAINE) 2 % viscous mouth solution 20 mL  20 mL Mouth/Throat PRN Roma Kayser Schorr, NP      . mesalamine (LIALDA) EC tablet 1.2 g  1.2 g Oral QID Roma Kayser Schorr, NP      . mesalamine (ROWASA) enema 4 g  4 g Rectal BID Alison Murray, MD   4 g at 10/28/11 1031  . morphine 2 MG/ML injection 1 mg  1 mg Intravenous Q4H PRN Alison Murray, MD      . ondansetron [facility]) tablet 4 mg  4 mg Oral Q6H PRN Alison Murray, MD       Or  . ondansetron [facility]) injection 4 mg  4 mg Intravenous Q6H PRN Alison Murray, MD   4 mg at 10/27/11 2059  . PHENobarbital (LUMINAL) tablet 97.2 mg  97.2 mg Oral BID Alison Murray, MD   97.2 mg at 10/28/11 1030  . phenytoin (DILANTIN) 125 MG/5ML suspension 100 mg  100 mg Per Tube TID Henderson Cloud, MD   100 mg at 10/28/11 1030  . promethazine (PHENERGAN) injection 12.5 mg  12.5 mg Intravenous Once Leanne Chang, NP   12.5 mg at 10/27/11 2345  . senna-docusate (Senokot-S) tablet 1 tablet  1 tablet Per Tube QHS PRN Alison Murray, MD      . DISCONTD: 0.9 %  sodium chloride infusion   Intravenous Q8 Weeks Beverley Fiedler, MD      . DISCONTD: Acetaminophen LIQD 650 mg  650 mg Per Tube Q8 Weeks Beverley Fiedler, MD      . DISCONTD: aspirin EC tablet 81 mg  81 mg Oral Daily Alison Murray, MD      . DISCONTD: ciprofloxacin (CIPRO) IVPB 400 mg  400 mg Intravenous BID Alison Murray, MD   400 mg at  10/27/11 2300  . DISCONTD: diphenhydrAMINE (BENADRYL) 12.5 MG/5ML elixir 25 mg  25 mg Oral Q8 Weeks Beverley Fiedler, MD      . DISCONTD: inFLIXimab (REMICADE) 5 mg/kg = 300 mg in  sodium chloride 0.9 % 250 mL infusion  5 mg/kg Intravenous Q8 weeks Beverley Fiedler, MD      . DISCONTD: phenytoin (DILANTIN) ER capsule 100 mg  100 mg Oral Daily Alison Murray, MD      . DISCONTD: sodium chloride 0.9 % bolus 1,000 mL  1,000 mL Intravenous Once Gwyneth Sprout, MD        Allergies as of 10/27/2011  . (No Known Allergies)    Family History  Problem Relation Age of Onset  . Coronary artery disease Mother   . Heart disease Mother   . Diabetes Sister   . Breast cancer Maternal Aunt   . Breast cancer Sister     History   Social History  . Marital Status: Married    Spouse Name: N/A    Number of Children: 2  . Years of Education: N/A   Occupational History  . retired     Sales executive   Social History Main Topics  . Smoking status: Former Smoker    Quit date: 10/26/2004  . Smokeless tobacco: Never Used  . Alcohol Use: No     normally 1 glass wine after dinner - none x 2.5 months  . Drug Use: No  . Sexually Active: Not Currently   Other Topics Concern  . Not on file   Social History Narrative   HSG. Married x 7 years divorced; married 18-Dec-2067. 1 son- died as a neonate, 1 son- 12/18/2058. Retired- worked as a Sales executive.   End of life: has a living will- does not want futile/ heroic care; no cpr.  Marriage- reports marriage is in good health (4/10)    Review of Systems: Pertinent positive and negative review of systems were noted in the above HPI section.  All other review of systems was otherwise negative. Physical Exam: Vital signs in last 24 hours: Temp:  [98 F (36.7 C)-98.9 F (37.2 C)] 98.6 F (37 C) (04/26 0800) Pulse Rate:  [78-117] 96  (04/26 0810) Resp:  [15-22] 15  (04/26 0810) BP: (112-162)/(44-68) 151/59 mmHg (04/26 0810) SpO2:  [88 %-98 %] 96 % (04/26 0810) FiO2  (%):  [30 %] 30 % (04/26 0529) Weight:  [111 lb 1.8 oz (50.4 kg)] 111 lb 1.8 oz (50.4 kg) (04/26 0113) Last BM Date: 10/27/11 General:   Alert,  Well-developed, thin chronically ill appearing, pleasant and cooperative in NAD  Head:  Normocephalic and atraumatic.trach in pace  Eyes:  Sclera clear, no icterus.   Conjunctiva pink. Ears:  Normal auditory acuity. Nose:  No deformity, discharge,  or lesions.    Neck:  Supple; no masses or thyromegaly.trached Lungs:  Clear throughout to auscultation.   No wheezes, crackles, or rhonchi. Heart:  Regular rate and rhythm; no murmurs, clicks, rubs,  or gallops. Abdomen:  Soft mildly tender in upper abdomen, no guarding, no rebound, Bs+ ,Peg in place LUQ Rectal:  Deferred  Msk:  Symmetrical without gross deformities, thin . Pulses:  Normal pulses noted. Extremities:  Without clubbing or edema. Neurologic:  Alert and  oriented x4;  grossly normal neurologically.somewhat anxious Skin:  Intact without significant lesions or rashes.. Psych:  Alert and cooperative. Normal mood and affect.  Intake/Output from previous day: 04/25 0701 - 04/26 0700 In: 710 [I.V.:450; IV Piggyback:200] Out: 901 [Urine:900; Stool:1] Intake/Output this shift: Total I/O In: -  Out: 175 [Urine:175]  Lab Results:  Basename 10/28/11 0440 10/27/11 2315 10/27/11 1635  WBC 10.2 11.9* 11.3*  HGB 10.6* 9.7* 9.8*  HCT 32.0* 29.0* 29.5*  PLT 271 271 248   BMET  Basename 10/28/11 0440 10/27/11 2315 10/27/11 1635  NA 133* 129* 124*  K 3.2* 3.5 3.5  CL 95* 94* 87*  CO2 29 28 28   GLUCOSE 100* 122* 107*  BUN 5* 6 9  CREATININE 0.52 0.42* 0.57  CALCIUM 8.4 8.3* 8.7   LFT  Basename 10/28/11 0440  PROT 7.2  ALBUMIN 2.9*  AST 16  ALT 9  ALKPHOS 175*  BILITOT 0.2*  BILIDIR --  IBILI --   PT/INR  Basename 10/27/11 2315  LABPROT 16.4*  INR 1.30      Studies/Results: Dg Chest 2 View  10/27/2011  *RADIOLOGY REPORT*  Clinical Data: Cough  CHEST - 2 VIEW   Comparison: 10/21/2011  Findings: Heart size is normal.  There is no pleural effusion or pulmonary edema.  No airspace consolidation identified.  Review of the visualized osseous structures is unremarkable.  IMPRESSION:  1.  No active cardiopulmonary abnormalities.  Original Report Authenticated By: Rosealee Albee, M.D.   US Abdomen Complete  10/27/2011  *RADIOLOGY REPORT*  Clinical Data:  Cholecystitis.  COMPLETE ABDOMINAL ULTRASOUND  Comparison:  10/26/2011.  Findings:  Gallbladder:  No gallstones, gallbladder wall thickening, or pericholecystic fluid.  Common bile duct:  5.3 mm, normal.  Liver:  6 mm simple hepatic cyst identified in the left hepatic lobe.  Otherwise normal.  IVC:  Appears normal.  Pancreas:  No focal abnormality seen.  Spleen:  93 mm.  Normal echotexture.  Right Kidney:  11.4 cm long axis.  Left Kidney:  10.2 cm.  Mild renal atrophy.  15 mm interpolar renal cyst.  Abdominal aorta:  19 mm, normal.  IMPRESSION:  1.  No cholelithiasis or cholecystitis. 2.  Left renal cyst and mild left renal atrophy. 3.  6 mm left hepatic lobe simple cyst.  Original Report Authenticated By: Andreas Newport, M.D.    IMPRESSION:  #52 71 year old white female with history of ulcerative colitis, probable mild to moderate exacerbation. It is not clear that the patient has been receiving her we'll and Rowasa on a regular basis. We will resume both. In order was placed for Remicade and this will be discontinued. Is not felt that she is appropriate for resumption of Remicade at this point and that will be decided in the future after she is further out from her laryngectomy and we are sure she has no underlying infection. #2 anemia, multifactorial stable #3 hypokalemia correcting #4 hyponatremia, correcting #5 history of laryngeal cancer status post recent laryngectomy March 2013 done at [facility] #6 PEG tube, patient is not using at this time will consider D/C soon   PLAN: Dysphagia 3 diet Resume Rowasa  enemas twice daily Lialda 1.2- 4 tablets by mouth daily Will followup on CT later today   Endora Teresi  10/28/2011, 12:01 PM

## 2011-10-28 NOTE — Progress Notes (Signed)
Subjective: [name] is a very complex patient. 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 Her course was complicated by a wound fistula but per [facility] note 4/16 this had healed and she was doing ok. She has been in rehab in [facility]. For the past several days she has had refractory N/V, hematochezia. Dr. [name] had been consulted by phone and she did come to CT abdomen and labs. CT was unremarkable, lab revealed a leukocytosis for which she was started on levaquin. She continued to have N/v and there was concern for risk of occluding her tracheostomy tube. Per RN notes Hospitalization was recommended by Dr. [name] but he preferred the patient to have ED evaluation and admission.  She was seen in ED and did appear to be relatively stable: Abd u/s w/o acute gall bladder disease, labs with improved leukocytosis. Due to her unstable condition she was admitted, intially to a med surg bed but when her airway was threatened she was moved to step-down.  Dr. Lauro Franklin office records reviewed: he had recommended discontinuation of remicade. She was to have resumed asacol enemas for mgt of her IBD, a primary source for hemtochezia. Per Rn the patient refused this today preferring PO treatment.  Patient is mute but lip reading works and she indicates that when she gets lower abdominal pain this leads to N/V. Currently she is not having pain. Objective: Lab: Lab Results  Component Value Date   WBC 10.2 10/28/2011   HGB 10.6* 10/28/2011   HCT 32.0* 10/28/2011   MCV 89.6 10/28/2011   PLT 271 10/28/2011   BMET    Component Value Date/Time   NA 133* 10/28/2011 0440   K 3.2* 10/28/2011 0440   CL 95* 10/28/2011 0440   CO2 29 10/28/2011 0440   GLUCOSE 100* 10/28/2011 0440   BUN 5* 10/28/2011 0440   CREATININE 0.52 10/28/2011 0440   CALCIUM 8.4 10/28/2011 0440   GFRNONAA >90 10/28/2011 0440   GFRAA >90 10/28/2011 0440   LFT's normal ABG RA: 7.46/39.5/52.9   Imaging: CXR  4/25 IMPRESSION:  1. No active cardiopulmonary abnormalities.  Abed U/S IMPRESSION:  1. No cholelithiasis or cholecystitis.  2. Left renal cyst and mild left renal atrophy.  3. 6 mm left hepatic lobe simple cyst.    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 Cor - RRR Pulm - trach in place. No respiratory distress. No rales or wheeze Abd - soft, no guarding, no rebound, no severe tenderness. Neuro - A&O, able to communicate mouthing words and writing on a tablet. No focal deficits    Assessment/Plan: 1. N/V - etiology undeclared. Patient reports that this is related to lower abdominal pain. Plan - continue antiemetics            Treat colitis            Advance to full -liquid diet  2. Hyponatremia - 133 today. Plan - continue IV-NS  3. Anemia - baseline is 13g. Suspect loss of blood due to flare of colitis  4. Colitis/GI - patient is off Remicade. Mesalamine enemas have been reinstituted Plan - GI consult   Illene Regulus 10/28/2011, 11:03 AM

## 2011-10-28 NOTE — Clinical Documentation Improvement (Signed)
MALNUTRITION DOCUMENTATION CLARIFICATION  THIS DOCUMENT IS NOT A PERMANENT PART OF THE MEDICAL RECORD  TO RESPOND TO THE THIS QUERY, FOLLOW THE INSTRUCTIONS BELOW:  1. If needed, update documentation for the patient's encounter via the notes activity.  2. Access this query again and click edit on the In [employer].  3. After updating, or not, click F2 to complete all highlighted (required) fields concerning your review. Select "additional documentation in the medical record" OR "no additional documentation provided".  4. Click Sign note button.  5. The deficiency will fall out of your In Basket *Please let us know if you are not able to complete this workflow by phone or e-mail (listed below).  Please update your documentation within the medical record to reflect your response to this query.                                                                                        10/28/11   Dear Dr.NORINS, [facility],  In a better effort to capture your patient's severity of illness, reflect appropriate length of stay and utilization of resources, a review of the patient medical record has revealed the following indicators.    Based on your clinical judgment, please clarify and document in a progress note and/or discharge summary the clinical condition associated with the following supporting information:  In responding to this query please exercise your independent judgment.  The fact that a query is asked, does not imply that any particular answer is desired or expected.   Pt admitted with hyponatremia and r/o viral gastroenteritis  Pt's BMI=  18 in setting of continued emesis and r/o gastroenteritis.   Please clarify whether or not BMI can be linked to one of he diagnoses listed below and document in pn  and d/c. Thank You!  [facility]: When linking BMI to a diagnosis please document both BMI and diagnosis together in pn for accuracy of SOI and ROM.    Possible Clinical  Conditions?  _______Severe Malnutrition   _______Protein Calorie Malnutrition _______Severe Protein Calorie Malnutrition  _______Other Condition________________ _______Cannot clinically determine     Supporting Information: Risk Factors: R/O viral gastroenteritis/ hyponatremia/ H/O CVA, HLD, laryngeal ca, anemia, trache, G-Tube  Signs & Symptoms: Emesis  BMI-18 5'6"/111lbs  Diagnostics: Component Sodium  Latest Ref Rng 135 - 145 mEq/L  10/27/2011 129 (L)  10/28/2011 133 (L)    Treatment  Diet Dys 3 Daily weights I & O's CGB monitoring   You may use possible, probable, or suspect with inpatient documentation. possible, probable, suspected diagnoses MUST be documented at the time of discharge  Reviewed:  no additional documentation provided ljh  Thank You,  Enis Slipper  RN, BSN, CCDS Clinical Documentation Specialist [facility] Dept Pager: [phone] / E-mail: [email or web address]  Health Information Management [place]

## 2011-10-28 NOTE — Progress Notes (Signed)
Patient has had hard time with vomiting that plugs up laryngeal tube so we will transfer to stepdown.

## 2011-10-28 NOTE — Progress Notes (Signed)
10/28/11 0300- Late entry: Pt arrived from 3rd floor with RT at bedside reporting that pt was in resp distress and staff was worried she was going to plug her airway. Upon assessment, pt was not in any distress, O2 sat 95% RA. Laryngectomy tube in place. Pt irritable with staff. Explained reason for transfer and she expressed discontent with moving rooms and having monitors on. Pt is able to communicate through writing and mouthing words. ABG drawn per MD order- pO2 low, tried to place pt on O2 and she refused. Will reattempt later in shift- O2 sat reading 96%. Pt showing no signs of distress, will continue to monitor.

## 2011-10-29 ENCOUNTER — Inpatient Hospital Stay (HOSPITAL_COMMUNITY): Payer: Medicare Other

## 2011-10-29 DIAGNOSIS — D649 Anemia, unspecified: Secondary | ICD-10-CM

## 2011-10-29 DIAGNOSIS — R112 Nausea with vomiting, unspecified: Secondary | ICD-10-CM

## 2011-10-29 LAB — GLUCOSE, CAPILLARY

## 2011-10-29 LAB — BASIC METABOLIC PANEL
Chloride: 100 mEq/L (ref 96–112)
GFR calc Af Amer: >90 mL/min (ref >90)
GFR calc non Af Amer: >90 mL/min (ref >90)
Glucose, Bld: 114 mg/dL — ABNORMAL HIGH (ref 70–99)
Potassium: 3.8 mEq/L (ref 3.5–5.1)
Sodium: 134 mEq/L — ABNORMAL LOW (ref 135–145)

## 2011-10-29 LAB — CBC
Hemoglobin: 9.1 g/dL — ABNORMAL LOW (ref 12.0–15.0)
MCHC: 32.3 g/dL (ref 30.0–36.0)
RDW: 15.2 % (ref 11.5–15.5)
WBC: 7 10*3/uL (ref 4.0–10.5)

## 2011-10-29 NOTE — Progress Notes (Signed)
Subjective: *Abdominal pain is gone.  No nausea, vomiting**  Objective: Vital signs in last 24 hours: Temp:  [97.5 F (36.4 C)-99.2 F (37.3 C)] 98.6 F (37 C) (04/27 0800) Pulse Rate:  [70-105] 70  (04/27 0800) Resp:  [14-23] 14  (04/27 0800) BP: (106-158)/(51-79) 132/55 mmHg (04/27 0800) SpO2:  [90 %-100 %] 100 % (04/27 0800) FiO2 (%):  [28 %] 28 % (04/27 0800) Weight:  [117 lb 1 oz (53.1 kg)] 117 lb 1 oz (53.1 kg) (04/27 0000) Last BM Date: 10/28/11 General:   Alert,  Well-developed, well-nourished, pleasant and cooperative in NAD Head:  Normocephalic and atraumatic. Eyes:  Sclera clear, no icterus.   Conjunctiva pink. Mouth:  No deformity or lesions, dentition normal. Neck:  Supple; no masses or thyromegaly. Heart:  Regular rate and rhythm; no murmurs, clicks, rubs,  or gallops. Abdomen:  Soft, nontender and nondistended. No masses, hepatosplenomegaly or hernias noted. Normal bowel sounds, without guarding, and without rebound.   Msk:  Symmetrical without gross deformities. Normal posture. Pulses:  Normal pulses noted. Extremities:  Without clubbing or edema. Neurologic:  Alert and  oriented x4;  grossly normal neurologically. Skin:  Intact without significant lesions or rashes. Cervical Nodes:  No significant cervical adenopathy. Psych:  Alert and cooperative. Normal mood and affect.  Intake/Output from previous day: 04/26 0701 - 04/27 0700 In: 2375 [I.V.:2375] Out: 1181 [Urine:1155; Emesis/NG output:25; Stool:1] Intake/Output this shift:    Lab Results:  Basename 10/29/11 0414 10/28/11 0440 10/27/11 2315  WBC 7.0 10.2 11.9*  HGB 9.1* 10.6* 9.7*  HCT 28.2* 32.0* 29.0*  PLT 232 271 271   BMET  Basename 10/29/11 0414 10/28/11 0440 10/27/11 2315  NA 134* 133* 129*  K 3.8 3.2* 3.5  CL 100 95* 94*  CO2 29 29 28   GLUCOSE 114* 100* 122*  BUN 6 5* 6  CREATININE 0.52 0.52 0.42*  CALCIUM 8.1* 8.4 8.3*   LFT  Basename 10/28/11 0440  PROT 7.2  ALBUMIN 2.9*  AST  16  ALT 9  ALKPHOS 175*  BILITOT 0.2*  BILIDIR --  IBILI --   PT/INR  Basename 10/27/11 2315  LABPROT 16.4*  INR 1.30   Hepatitis Panel No results found for this basename: HEPBSAG,HCVAB,HEPAIGM,HEPBIGM in the last 72 hours   Studies/Results: Dg Chest 2 View  10/27/2011  *RADIOLOGY REPORT*  Clinical Data: Cough  CHEST - 2 VIEW  Comparison: 10/21/2011  Findings: Heart size is normal.  There is no pleural effusion or pulmonary edema.  No airspace consolidation identified.  Review of the visualized osseous structures is unremarkable.  IMPRESSION:  1.  No active cardiopulmonary abnormalities.  Original Report Authenticated By: Rosealee Albee, M.D.   US Abdomen Complete  10/27/2011  *RADIOLOGY REPORT*  Clinical Data:  Cholecystitis.  COMPLETE ABDOMINAL ULTRASOUND  Comparison:  10/26/2011.  Findings:  Gallbladder:  No gallstones, gallbladder wall thickening, or pericholecystic fluid.  Common bile duct:  5.3 mm, normal.  Liver:  6 mm simple hepatic cyst identified in the left hepatic lobe.  Otherwise normal.  IVC:  Appears normal.  Pancreas:  No focal abnormality seen.  Spleen:  93 mm.  Normal echotexture.  Right Kidney:  11.4 cm long axis.  Left Kidney:  10.2 cm.  Mild renal atrophy.  15 mm interpolar renal cyst.  Abdominal aorta:  19 mm, normal.  IMPRESSION:  1.  No cholelithiasis or cholecystitis. 2.  Left renal cyst and mild left renal atrophy. 3.  6 mm left hepatic lobe simple cyst.  Original Report Authenticated By: Andreas Newport, M.D.    Assessment: *1) ulcerative colitis - stable  2) nausea/vomiting, abdominal pain - secondary to migrated gastrostomy tube - s/p replacement  Recommendations 1) awaiting g tube study to ensure placement in the stomach.    Signing off.**         Barbette Hair. Arlyce Dice, MD, [facility] [phone]   Melvia Heaps  10/29/2011, 10:54 AM

## 2011-10-29 NOTE — Progress Notes (Signed)
Order placed by Mike Gip PA for UGI through peg. Radiology states this test complete (kub with injection) and results in chart. Dr Arlyce Dice notified of above. Radiology to cancel duplicate test.

## 2011-10-29 NOTE — Progress Notes (Signed)
Subjective: Awake and alert. Communicates that she has not had any further nausea or vomiting. She has not had any blood stools and has been accepting her Rowasa enemas. She is glad G-tube is out. She is determined to return home after this admission.  Objective: Lab: Lab Results  Component Value Date   WBC 7.0 10/29/2011   HGB 9.1* 10/29/2011   HCT 28.2* 10/29/2011   MCV 91.3 10/29/2011   PLT 232 10/29/2011   BMET    Component Value Date/Time   NA 134* 10/29/2011 0414   K 3.8 10/29/2011 0414   CL 100 10/29/2011 0414   CO2 29 10/29/2011 0414   GLUCOSE 114* 10/29/2011 0414   BUN 6 10/29/2011 0414   CREATININE 0.52 10/29/2011 0414   CALCIUM 8.1* 10/29/2011 0414   GFRNONAA >90 10/29/2011 0414   GFRAA >90 10/29/2011 0414      Imaging: No new imaging   Physical Exam: Filed Vitals:   10/29/11 0520  BP: 106/79  Pulse: 81  Temp:   Resp: 21   A&O and actively communicating HEENT - normal Neck- trach looks OK Pulm - good BS, w/o rales or wheezes Cor - RRR, 2+ radial pulse Abd- G-tube out. BS+, abd soft Neuro - non-focal     Assessment/Plan: 1. N/V - doing much better. Dr. Arlyce Dice had opined that G-tube position in duodenum may have been a precipitating factor and since removal she has done much better.  2. Hypnatremia - Na=134 Plan  Continue NS  3. Anemia - Hgb remains low. Plan- f/u H/H -  Anemia panel with next blood draw  4. Colitis - no recurrent bloody stool. She is taking rowasa enemas and oral mesalamine Plan -  Con't present regimen  5. Nutrition - patient is malnourished due to her complex recent h/o surgery with complication of fistula, need for tube feeding until the last several weeks and limited PO due to N/V Plan- Nutritional consult reviewed and appreicated.  Dispo - hopefully home with [facility] Monday or Tuesday.  Will transfer to med/surg bed.     Illene Regulus 10/29/2011, 8:27 AM

## 2011-10-30 LAB — BASIC METABOLIC PANEL
BUN: 6 mg/dL (ref 6–23)
Calcium: 8 mg/dL — ABNORMAL LOW (ref 8.4–10.5)
GFR calc non Af Amer: >90 mL/min (ref >90)
Glucose, Bld: 123 mg/dL — ABNORMAL HIGH (ref 70–99)

## 2011-10-30 LAB — RETICULOCYTES
RBC.: 2.91 MIL/uL — ABNORMAL LOW (ref 3.87–5.11)
Retic Count, Absolute: 87.3 10*3/uL (ref 19.0–186.0)
Retic Ct Pct: 3 % (ref 0.4–3.1)

## 2011-10-30 LAB — FERRITIN: Ferritin: 10 ng/mL (ref 10–291)

## 2011-10-30 NOTE — Evaluation (Addendum)
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  She will benefit from skilled PT in the acute setting to maximize independence and safety for moilbility prior to d/c home with spouse assist and HHPT.  She would also benefit from home speech therapy if able to assist with training to use electrolarynx.  Also educated patient and spouse in needing to use walker and have assist initially with mobility at home due to multiple falls recently with injury and due to posterior bias when first standing with fall risk.  Discussed gradual return to activity even though patient eager to get to planting garden, etc once home.    PT Assessment  Patient needs continued PT services    Follow Up Recommendations  Home health PT;Other (comment) [facility] Speech therapy if able to assist with electrolarynx)    Equipment Recommendations  None recommended by PT    Frequency Min 3X/week    Precautions / Restrictions Precautions Precautions: Fall Precaution Comments: h/o of falls x 2 in past 13 months with pelvic then hip fracture   Pertinent Vitals/Pain Denies pain      Mobility  Bed Mobility Details for Bed Mobility Assistance: patient up in chair Transfers Transfers: Sit to Stand;Stand to Sit Sit to Stand: 5: Supervision;From chair/3-in-1;With armrests Stand to Sit: 5: Supervision;To chair/3-in-1;With armrests Details for Transfer Assistance: supervision for safety, noted initially weight back on heels Ambulation/Gait Ambulation/Gait Assistance: 5: Supervision Ambulation Distance (Feet): 325 Feet Assistive device: Rolling  walker Ambulation/Gait Assistance Details: demonstrates step through gait with wide base of support Gait velocity: demonstrates speed [facility] for community mobility Stairs: Yes Stairs Assistance: 4: Min guard Stair Management Technique: Step to pattern;Sideways;One rail Left Number of Stairs: 4     Exercises     PT Goals Acute Rehab PT Goals PT Goal Formulation: With patient Time For Goal Achievement: 11/06/11 Potential to Achieve Goals: Good Pt will go Sit to Stand: with modified independence PT Goal: Sit to Stand - Progress: Goal set today Pt will go Stand to Sit: with modified independence PT Goal: Stand to Sit - Progress: Goal set today Pt will Ambulate: >150 feet;with least restrictive assistive device;with modified independence PT Goal: Ambulate - Progress: Goal set today Pt will Go Up / Down Stairs: 3-5 stairs;with least restrictive assistive device;with rail(s);with supervision PT Goal: Up/Down Stairs - Progress: Goal set today  Visit Information  Last PT Received On: 10/30/11 Assistance Needed: +1    Subjective Data  Subjective: I  am so happy to be up and active. Patient Stated Goal: To return home tomorrow.   Prior Functioning  Home Living Lives With: Spouse Available Help at Discharge: Family Type of Home: House Home Access: Stairs to enter Secretary/administrator of Steps: 3 Entrance Stairs-Rails: Right Home Layout: One level Bathroom Shower/Tub: Banker: Shower chair with back;Walker - rolling;Straight cane Prior Function Level of Independence: Needs assistance Comments: has been in hospital or rehab over past 2 months Communication Communication: Other (comment) (s/p laryngectomy 2 months ago, using written communication)    Cognition  Overall Cognitive Status: Appears within functional limits for tasks assessed/performed Arousal/Alertness: Awake/alert Orientation Level: Appears intact for tasks assessed Behavior During  Session:  WFL for tasks performed    Extremity/Trunk Assessment Right Lower Extremity Assessment RLE ROM/Strength/Tone: [facility] for tasks assessed Left Lower Extremity Assessment LLE ROM/Strength/Tone: WFL for tasks assessed   Balance Balance Balance Assessed: Yes Static Standing Balance Static Standing - Balance Support: No upper extremity supported Static Standing - Level of Assistance: 5: Stand by assistance Static Standing - Comment/# of Minutes: standing to pull up briefs, needed supervision and support from behind from chair on backs of legs due to weight back on heels.  End of Session PT - End of Session Equipment Utilized During Treatment: Gait belt Activity Tolerance: Patient tolerated treatment well Patient left: in chair;with call bell/phone within reach;with family/visitor present   [facility] 10/30/2011, 3:56 PM

## 2011-10-30 NOTE — Progress Notes (Signed)
Addendum- SO reports that her doc at [facility] want her to take oral feeds. The G-tube is for back up and hoepfully will not need to be used.

## 2011-10-30 NOTE — Progress Notes (Signed)
Subjective: Patinet is sitting on the side of the bed. She has just had enema done. Thre is no nausea or vomiting. She is comfortable.she wants to go home  Objective: Lab: Lab Results  Component Value Date   WBC 7.0 10/29/2011   HGB 8.6* 10/30/2011   HCT 26.0* 10/30/2011   MCV 91.3 10/29/2011   PLT 232 10/29/2011   BMET    Component Value Date/Time   NA 131* 10/30/2011 0502   K 3.1* 10/30/2011 0502   CL 99 10/30/2011 0502   CO2 26 10/30/2011 0502   GLUCOSE 123* 10/30/2011 0502   BUN 6 10/30/2011 0502   CREATININE 0.46* 10/30/2011 0502   CALCIUM 8.0* 10/30/2011 0502   GFRNONAA >90 10/30/2011 0502   GFRAA >90 10/30/2011 0502     Imaging: KUB for placement of G-tube 10/29/11 IMPRESSION:  1. Gastrostomy tube is within the lumen of the stomach.   Physical Exam: Filed Vitals:   10/30/11 0649  BP: 99/63  Pulse: 80  Temp: 97.5 F (36.4 C)  Resp: 16   Chronically ill appearing white woman who is in no distress HEENT - normal Neck- trach site ok Pulm - normal respirations. No rales or wheezes Cor- RRR Abd - not examined.    Assessment/Plan: 1. GI- N/v resolved with new placement of G-tube. No report of bloody stools, although Hgb has dropped. Plan - resume G-tube feedings  Continue rowasa and lialda for colitis  2. Low Na - stable  3. Anemia - continued slow drop. No source other than colitis. Not at transfusion threshold. Plan  Recheck Hgb in AM  4. Nutrition - Due to poor intake and colitis Plan - PEG tube placement confirmed - should be able to tolerate supplements via PEG.  Dispo - need to talk w/ SO about plans for return to home. Called  - he is ready for her to come home.   Casimiro Needle Norins 10/30/2011, 10:08 AM

## 2011-10-31 ENCOUNTER — Telehealth: Payer: Self-pay | Admitting: Internal Medicine

## 2011-10-31 ENCOUNTER — Telehealth: Payer: Self-pay

## 2011-10-31 LAB — IRON AND TIBC: UIBC: 252 ug/dL (ref 125–400)

## 2011-10-31 MED ORDER — ASPIRIN 81 MG PO CHEW: 81.0000 mg | CHEWABLE_TABLET | Freq: Every day | ORAL | Status: DC

## 2011-10-31 MED ORDER — MESALAMINE 1.2 G PO TBEC: 1.2000 g | DELAYED_RELEASE_TABLET | Freq: Four times a day (QID) | ORAL | Status: DC

## 2011-10-31 MED ORDER — SODIUM CHLORIDE 0.9 % IV SOLN
125.0000 mg | Freq: Once | INTRAVENOUS | Status: AC
  Administered 2011-10-31: 125 mg via INTRAVENOUS
  Filled 2011-10-31: qty 10

## 2011-10-31 MED ORDER — MESALAMINE (SULFITE-FREE) 4 GM/60ML RE ENEM: ENEMA | RECTAL | Status: DC

## 2011-10-31 MED ORDER — ONDANSETRON HCL 4 MG PO TABS: 4.0000 mg | ORAL_TABLET | Freq: Four times a day (QID) | ORAL | Status: AC | PRN

## 2011-10-31 NOTE — Progress Notes (Signed)
Spoke with patient at bedside. She indicated that she was very anxious and excited to go home. States her husband would know who her [facility] agency is. When husband arrived he indicated that they use [facility]. He was agreeable to RN, PT, ST but declined OT. States has all DME that he needs.

## 2011-10-31 NOTE — Telephone Encounter (Signed)
Received a call from patient's husband that she will be discharged from hospital today. He wants to know if she is to continue on the Lialda 1.2 G 1 tab QID and the Mesalamine enema BID. Please, advise.

## 2011-10-31 NOTE — Discharge Summary (Signed)
NAMEADAMARIS, [name]            ACCOUNT NO.:  [id number]  MEDICAL RECORD NO.:  [id number]  LOCATION:  1530                         FACILITY:  [facility]  PHYSICIAN:  Rosalyn Gess. Lisset Ketchem, MD  DATE OF BIRTH:  [birth year]  DATE OF ADMISSION:  10/27/2011 DATE OF DISCHARGE:  10/31/2011                              DISCHARGE SUMMARY   ADMITTING DIAGNOSES: 1. Refractory nausea and vomiting. 2. Hyponatremia. 3. Anemia of chronic disease.  DISCHARGE DIAGNOSES: 1. Nausea and vomiting resolved with replacement and relocation of     gastrostomy tube. 2. Hyponatremia, stable. 3. Anemia of chronic disease, stable.  CONSULTANTS:  Dr. Arlyce Dice for GI.  PROCEDURES: 1. Two-view chest on day of admission, which showed no active     cardiopulmonary abnormalities. 2. Ultrasound of the abdomen on day of admission, which showed no     cholelithiasis or cholecystitis.  Left renal cyst and mild left     renal atrophy.  A 6-mm left hepatic lobe simple cyst. 3. Two-view of the abdomen on October 28, 1929, which showed gastrostomy     tube is within the lumen of the stomach. 4. Removal of gastrostomy tube percutaneously and replacement with a     24-French button gastrostomy.  HISTORY OF PRESENT ILLNESS:  [name] is an unfortunate 71 year old woman who has had recurrent oral cancer with laryngeal cancer.  She had already had previous maximum dose radiation therapy and also was not a candidate for chemotherapy.  She underwent a total laryngectomy at [facility].  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  This did resolve.  The patient had very poor nutrition with difficulty eating and she was placed to a PEG tube at [facility].  The patient also has a permanent trach.  The patient was at [facility] when she started to have increasing problems with hematochezia.  She also developed refractory nausea and vomiting, and there was concern that this  could endanger her trach and airway.  Dr. [name] was consulted by phone in regards to the management of her hematochezia.  CT scan of the abdomen and pelvis was performed that showed no significant abnormalities. Because of her symptoms, the patient was referred to [facility] Emergency Department where she was evaluated and subsequently admitted.  Please see the H and P as well as prior Epic records for past medical history, family history, and social history.  MEDICATIONS PRIOR TO ADMISSION: 1. Aspirin 81 mg daily. 2. Rowasa 4 g enema b.i.d. 3. Glucerna daily. 4. Phenobarbital 97.2 mg b.i.d. 5. Phenytoin 4 mL 3 times a day via G-tube.  ADMISSION PHYSICAL EXAMINATION:  VITAL SIGNS:  Stable. HEENT:  Exam revealed the patient __________ status post surgery. NECK:  The patient has a permanent trach.  No JVD was noted.  No carotid bruit was present. CARDIOVASCULAR:  Unremarkable. LUNGS:  Clear with no rales, wheezes, or rhonchi. ABDOMEN:  Soft.  G-tube with mild surrounding erythema, but no drainage. MUSCULOSKELETAL:  Exam was unremarkable. SKIN:  Clear.  HOSPITAL COURSE: 1. GI.  The patient with refractory nausea and vomiting, which     endangered her airway.  Because of threatened airway, she must move  to the step-down unit for better management of her trach if needed.     The patient was seen by Dr. Arlyce Dice in consultation.  He felt that     the origin of her nausea and vomiting was that her G-tube had been     placed or migrated to the duodenum.  He proceeded to remove her PEG     tube and replaced it with a button gastrostomy tube that was well     located in the stomach.  After this intervention, the patient had     no recurrent nausea or vomiting. 2. Hyponatremia.  The patient was mildly hyponatremic at the time of     admission with a sodium of 124.  She was given normal saline and     this improved to 131, which is adequate.  The patient also had     mildly depressed  potassium.  She is taking orals at this time, and     we will need to have a followup be met. 3. Anemia.  The patient presented to the hospital with a hemoglobin of     9.8.  In November 2012, her hemoglobin was 13.1 g.  The patient has     had persistent colitis, and suspected cause of her dropping     hemoglobin may have been lower GI blood loss.  At the time of     discharge, the patient's last hemoglobin was 8.6, however she has     no active sign of bleeding, and it is felt that she is stable and     can have outpatient followup.  Iron level was drawn on the 28th and     was less than 10.  Iron saturation was too low to calculate.     Ferritin was 10.  Folate was 11.8.  Plan, the patient will be given     IV infusion of iron prior to discharge.  The patient will need to     have followup H and H in approximately 1 week and this can be drawn     by home health. 4. Colitis.  The patient was restarted on Rowasa enemas b.i.d. as well     as oral mesalamine twice a day.  On this regimen, she seemed to be     doing better.  She had no lower abdominal pain or discomfort, and     there is no report of continued hematochezia. 5. Malnutrition.  The patient is malnourished due to her complex     history of surgery and complications as well as inability to eat.     At this point, she is taking p.o.'s and not using her gastrostomy     tube.  The patient will need to continue with nutritional     supplement at home as well as her diet.  We would recommend Ensure     3 times a day. With the patient's nausea and vomiting resolved with her treatment for colitis resumed and with her having no recurrent hematochezia with her electrolytes being stable and with her anemia being under treatment, the patient is stable to be discharged for home and will be able to be followed up by home health, both PT/OT and Speech Therapy as well as Nursing.  The patient will need to be seen in the office in followup in  approximately 2 weeks.  She will see Dr. [name] for GI followup as directed.  Patient's condition at the time of  discharge dictation is stable, but guarded given her multiple comorbidities.     Rosalyn Gess Taralyn Ferraiolo, MD     MEN/MEDQ  D:  10/31/2011  T:  10/31/2011  Job:  [id number]

## 2011-10-31 NOTE — Progress Notes (Signed)
Doing well. She is still a bit anemic and iron deficient most likely from blood loss associated with colitis/IBD. Plan - venofer infusion prior to discharge  F/u CBC Friday May 3 by home health  Ready to return home with home health RN,PT,SLP.  D/C dictated  # [id number]

## 2011-10-31 NOTE — Telephone Encounter (Signed)
Pt's spouse called stating pt is going to be discharged form hospital today but verification of discharge medication needs to be done prior to her leaving. Spouse is requesting a call back from MD.

## 2011-10-31 NOTE — Telephone Encounter (Signed)
If not using PEG tube, then would flush once daily only with tap water (20-30 cc should be plenty)

## 2011-10-31 NOTE — Telephone Encounter (Signed)
Yes, the Lialda can be taken all together (this med does not need to be QID), but I would continue the 4.8g daily dose for now. Continue PR mesalamine at least qHS, if BID dosing too difficult at home.

## 2011-10-31 NOTE — Telephone Encounter (Signed)
Unable to reach patient's husband at home or cell. Left a message on his cell that Dr. [name] wants to keep patient on both medications. Looks as if Dr Debby Bud ordered Theodosia Blender. Told husband in message that I would send rx to her pharmacy for meds. And that he could do enema nightly only if it is too difficult to do BID at home.

## 2011-10-31 NOTE — Telephone Encounter (Signed)
Patient's husband states she has a g tube and he wants to know if he needs to flush it daily. States Dr Arlyce Dice repositioned it over the weekend. Patient is to see Dr. Manson Passey at [facility] on 11/16/11.  Please, advise.

## 2011-11-01 ENCOUNTER — Telehealth: Payer: Self-pay | Admitting: Internal Medicine

## 2011-11-01 NOTE — Telephone Encounter (Signed)
Spoke with [name] with [place] and we discussed her G tube. After further review, pt 's G tube was removed and replaced with a Button tube. Dr [name] will  discuss the matter with doctors.

## 2011-11-01 NOTE — Telephone Encounter (Signed)
Left a message for patient's husband to call me. 

## 2011-11-01 NOTE — Discharge Summary (Signed)
[name], [name]            ACCOUNT NO.:  [id number]  MEDICAL RECORD NO.:  [id number]  LOCATION:  1530                         FACILITY:  [facility]  PHYSICIAN:  [name] Gess. Chris Narasimhan, MD  DATE OF BIRTH:  1940-07-18  DATE OF ADMISSION:  10/27/2011 DATE OF DISCHARGE:  10/31/2011                              DISCHARGE SUMMARY   ADDENDUM:  PHYSICAL EXAMINATION:  At discharge, VITAL SIGNS:  Temperature was 98.7, blood pressure 143/57, pulse 82, respirations 18, oxygen saturations 100% on oxygen to trach. GENERAL APPEARANCE:  This is a chronically ill-appearing woman who has a very bright affect, is in no distress. HEENT:  Conjunctivae, sclerae clear.  Pupils equal, round, reactive. Oropharynx without lesions.  The patient is swallowing as she was eating breakfast. NECK:  The patient has a tracheostomy tube in the midline, which is stable with no signs of erythema and no signs of exudate. CHEST:  The patient is moving air well with no wheezing, no increased work of breathing. CARDIOVASCULAR:  2+ radial pulse.  Her precordium is quiet.  Her heart rate is regular.  No murmurs appreciated. ABDOMEN:  Positive bowel sounds noted.  G-tube with a bandage over it, but this is a button ostomy and no nursing reported any problems or abnormalities. GENITALIA AND RECTAL:  Exams deferred. EXTREMITIES:  Without clubbing, cyanosis, or edema. NEUROLOGICAL:  Patient is awake, alert.  She is oriented to person, place, time, and context.  She communicates by writing and can mouth words, although lip reading is difficult.  Neurologic exam is otherwise nonfocal.  LABORATORY DATA:  Final laboratory from April 28, sodium 131, potassium 3.1, chloride 99, CO2 of 26, BUN 6, creatinine 0.46, calcium 8, glucose 123, pre-albumin 13, which is slightly low with  17 to 34 being normal. Iron was less than 10.  U IVC was 252, ferritin was 10, folate was 11.8, B12 was 500.  Final hemoglobin on April 28 was 8.6.   Reticulocyte count was normal range at 3 with manual retic count of 87.3.     [name] Gess [name], MD     MEN/MEDQ  D:  10/31/2011  T:  11/01/2011  Job:  [id number]

## 2011-11-02 ENCOUNTER — Telehealth: Payer: Self-pay | Admitting: Internal Medicine

## 2011-11-02 NOTE — Telephone Encounter (Signed)
After two attempts (4/29 and 4/30), was able to schedule hospital follow up appointment.  The husband wanted to relay that Dr.Norins was exceptional during her hospital stay.  He appreciated the phone call on Sunday morning and stated "he was touched he cared so much about [name] to call and check on her"  :)

## 2011-11-03 ENCOUNTER — Telehealth: Payer: Self-pay

## 2011-11-03 NOTE — Telephone Encounter (Signed)
Spoke with Mr Kolodny this am to inform him we have been checking on flushing the BUTTON TUBE. We have spoken with several people at both hospitals and are trying to contact the rep for [facility] [employer] for guidelines; we will contact Tammy with [facility] again about this. Mr Kulkarni reports the pt is having an increased amount of blood in her stool and was up several times last PM. Discussed as to whether the problem is d/t  Missed Lialda and oral Mesalamine during vomiting episodes and hospitalization. He reports pt is a former alcoholic and she asked for wine last pm and became outraged when he told her he would ask Dr Manson Passey at [place] on 11/16/11 visit. He will call us for questions or problems.

## 2011-11-03 NOTE — Telephone Encounter (Signed)
Spoke with Tammy at [place] to inform her of plans to speak to [facility] [employer] rep for possible resolution of flushing question; will call her back. Tammy stated understanding.

## 2011-11-03 NOTE — Telephone Encounter (Signed)
Speech therapist called stating pt had evaluation today and she is requesting verabl order to proceed with treatment 2/week x 1 week, 3/week x 3 weeks and 2/week x 2 weeks. Olegario Messier needs caller's first and last name if leaving a message when returning the call.

## 2011-11-04 NOTE — Telephone Encounter (Signed)
K

## 2011-11-04 NOTE — Telephone Encounter (Signed)
Speech therapist Olegario Messier notified of order to proceedwith treatment. [name]

## 2011-11-07 ENCOUNTER — Telehealth: Payer: Self-pay | Admitting: *Deleted

## 2011-11-07 NOTE — Telephone Encounter (Signed)
Husband reports the [facility] nurse was just there and looked at the [facility] Peg tube; the area is red at the insertion site. Mr Jordan states it looks no worse than last week, but pt c/o pain at that area when she bends over. She sees Dr Erroll Luna next week at [facility]. Aniceto Boss, RN with [facility] left a message that the area is red and had a little drainage which she cleaned with saline and placed a dry dressing to the area; pt has no temp. LMOM for Marylene Land to call back if the drainage was purulent or had an odor. Dr [name], would you like for me to address this with Dr Erroll Luna at [facility]? Thanks.

## 2011-11-07 NOTE — Telephone Encounter (Signed)
Informed husband of cleaning orders; he stated understanding.

## 2011-11-07 NOTE — Telephone Encounter (Signed)
Spoke with [name] Boss, RN with [facility] to inform her of Dr Lauro Franklin orders. [name] stated she will check the sight on visits, but Mr loveland should be able to clean the sight and apply antibiotic cream. lmom for husband to call me.

## 2011-11-07 NOTE — Telephone Encounter (Signed)
Agree that home health RN should visualize and eval this area. Clean BID with warm soapy water, and can plan triple antibiotic ointment to the site BID. If becomes painful, laterally spreading redness, fever, or increased drainage, then it needs to be evaluated by MD promptly.  This could be done with acute office visit if necessary.

## 2011-11-09 ENCOUNTER — Telehealth: Payer: Self-pay | Admitting: *Deleted

## 2011-11-09 ENCOUNTER — Ambulatory Visit (INDEPENDENT_AMBULATORY_CARE_PROVIDER_SITE_OTHER): Payer: Medicare Other | Admitting: Nurse Practitioner

## 2011-11-09 ENCOUNTER — Encounter: Payer: Self-pay | Admitting: Nurse Practitioner

## 2011-11-09 VITALS — BP 134/70 | HR 64 | Ht 66.0 in | Wt 115.0 lb

## 2011-11-09 DIAGNOSIS — Z431 Encounter for attention to gastrostomy: Secondary | ICD-10-CM

## 2011-11-09 SURGERY — Surgical Case
Anesthesia: *Unknown

## 2011-11-09 NOTE — Telephone Encounter (Signed)
Spoke with Erie Noe at Dr [facility] of[facility] at [facility]; she will ask if we can remove the button tube. Will fax notes from [facility] and [facility] to [phone]

## 2011-11-09 NOTE — Telephone Encounter (Signed)
Mr Kirkey will have pt here ar 2pm to have button Gtube removed.

## 2011-11-09 NOTE — Telephone Encounter (Signed)
Received a call from [place] at Dr [facility]ora Gianotti of[facility] and his PA, Zella Ball spoke with Mr Ciriello about the tube. Zella Ball states it's ok to remove the button tube.

## 2011-11-09 NOTE — Telephone Encounter (Signed)
Pt here for removal of her button g tube. Successful removal of tube. Spoke with pt and her husband- pt wrote her questions/responses. He reported pt's lower legs and ankles are swelling again ;he reported this to the [facility] Nurse. The right leg however is red at the ankle up to about her calf; the area is warm to the touch as well and pain per pt- negative Homan's sign. Pt has an appt with Dr Debby Bud on 11/14/11. Dr Debby Bud, any advice? Thanks.

## 2011-11-09 NOTE — Telephone Encounter (Signed)
Recommend OV Thursday w/ Dr Debby Bud to evaluate for cellulitis.

## 2011-11-09 NOTE — Progress Notes (Signed)
[name] [id number] 1940-07-23   HISTORY OR PRESENT ILLNESS : Patient is a 71 year old female followed by Dr. Rhea [name] for history of ulcerative colitis. 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 Her Remicade was put on in the interest of post-operative healing.  Patient comes in today to have her gastrostomy tube removed. She is tolerating a soft diet and hasn't used the tube in some time. The button is causing her some local discomfort.   Current Medications, Allergies, Past Medical History, Past Surgical History, Family History and Social History were reviewed in [employer] record.   PHYSICAL EXAMINATION : General:  Well developed  female in no acute distress Abdomen: Soft, nondistended, nontender. Excessive granulation tissue around button.  Psychological:  Alert and cooperative. Normal mood and affect  ASSESSMENT AND PLAN :  17. 71 year old female with a history of laryngeal cancer for which she is s/p recent resection.   2. Gastrostomy tube. Patient has a button gastrostomy but no longer needs it as she is taking solids. In addition, the tube is causing local discomfort. Button gastrostomy tube removed by Dr. Juanda [name] in the office. She used a 59F Obturator to remove the gastrostomy. Patient tolerated the procedure well. A 4x4 dressing was applied to the site.   3. Ulcerative colitis, followed by Dr. Rhea [name]. She is on Mesalamine. Remicade presently on hold.

## 2011-11-10 ENCOUNTER — Ambulatory Visit (INDEPENDENT_AMBULATORY_CARE_PROVIDER_SITE_OTHER): Payer: Medicare Other | Admitting: Internal Medicine

## 2011-11-10 ENCOUNTER — Encounter: Payer: Self-pay | Admitting: Internal Medicine

## 2011-11-10 VITALS — BP 100/58 | HR 76 | Temp 97.8°F | Resp 16 | Wt 116.0 lb

## 2011-11-10 DIAGNOSIS — C329 Malignant neoplasm of larynx, unspecified: Secondary | ICD-10-CM

## 2011-11-10 DIAGNOSIS — I872 Venous insufficiency (chronic) (peripheral): Secondary | ICD-10-CM

## 2011-11-10 NOTE — Telephone Encounter (Signed)
Thanks Dr Debby Bud; she will see you today at 3:34pm. She looks so much better!

## 2011-11-11 ENCOUNTER — Other Ambulatory Visit: Payer: Self-pay

## 2011-11-11 MED ORDER — FOLIC ACID 1 MG PO TABS: 1.0000 mg | ORAL_TABLET | Freq: Every day | ORAL | Status: DC

## 2011-11-11 MED ORDER — DILTIAZEM HCL ER COATED BEADS 180 MG PO CP24: 180.0000 mg | ORAL_CAPSULE | Freq: Every day | ORAL | Status: DC

## 2011-11-12 MED ORDER — PHENOBARBITAL 97.2 MG PO TABS: 97.2000 mg | ORAL_TABLET | Freq: Two times a day (BID) | ORAL | Status: DC

## 2011-11-13 NOTE — Progress Notes (Signed)
Subjective:    Patient ID: [name], female    DOB: [birth year], 71 y.o.   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 She was recently hospitalized for refractory N/V which after evaluation was thought to be due to feeding tube being malposition. After replacement with button gastrostomy tube her symptoms resolved. She has subsequently been seen in GI as outpatient for removal of button-G'Tube.  She presents today for evaluation of redness and pain in the distal left LE - concerned for cellulitis. This arose spontaneously and seems to have improved with the use of support hose. She did not have any fever, chills or frank lesion on the leg.  Past Medical History  Diagnosis Date  . Peripheral vascular disease   . Subdural hematoma   . Ataxia   . Mild dysplasia of cervix   . UTI (lower urinary tract infection)   . Urinary incontinence   . Seizures   . Cerebrovascular disease   . Hypothyroidism   . Personal history of colonic polyps     adenomatous 12-11-95 & tubular adenoma and hyplastic  12/11/2006  . Hyperlipidemia   . Adenocarcinoma, breast     bilateral  . H/O alcohol abuse   . Diverticulosis of colon (without mention of hemorrhage)   . Redundant colon   . Squamous cell carcinoma of mouth   . Stroke   . C. difficile colitis   . Dementia t  . Ulcerative colitis    Past Surgical History  Procedure Date  . Subdural hematoma evacuation via craniotomy   . 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 Multiple tooth extractions     due to oral cancer  . Cataract extraction, bilateral   . Breast lumpectomy     left breast with radiation therapy  . Carotid endarterectomy     left  . Mastectomy     Right, history with nodule dissection  . Orif hip fracture Sept '12    Right hip: screw and plate repair.  . [facility] 08/2010    [facility]    Family History  Problem Relation Age of Onset  . Coronary artery disease Mother   . Heart disease Mother   . Diabetes Sister   . Breast cancer Maternal Aunt   . Breast cancer Sister    History   Social History  . Marital Status: Married    Spouse Name: N/A    Number of Children: 2  . Years of Education: N/A   Occupational History  . retired     Sales executive   Social History Main Topics  . Smoking status: Former Smoker    Quit date: 10/26/2004  . Smokeless tobacco: Never Used  . Alcohol Use: No     normally 1 glass wine after dinner - none x 2.5 months  . Drug Use: No  . Sexually Active: Not Currently   Other Topics Concern  . Not on file   Social History Narrative   HSG. Married x 7 years divorced; married 12/11/2067. 1 son- died as a neonate, 1 son- 11-Dec-2058. Retired- worked as a Sales executive.   End of life: has a living will- does not want futile/ heroic care; no cpr.  Marriage- reports marriage is in good health (4/10)       Review of Systems System review is negative for any constitutional, cardiac, pulmonary, GI  or neuro symptoms or complaints other than as described in the HPI.     Objective:   Physical Exam Filed Vitals:   11/10/11 1610  BP: 100/58  Pulse: 76  Temp: 97.8 F (36.6 C)  Resp: 16   Weight: 116 lb (52.617 kg)  Gen'l- chronically ill appearing white woman with tracheostomy. Cor- RRR Pulm - normal respirations Derm - mild erythema distal left LE without heat, only minimal tenderness.       Assessment & Plan:  Venous insufficiency - patient with peripheral edema that waxes through the course of the day and improves overnight. She has mild erythema distal left LE but no frank evidence of infection. She has had c.diff in the past related to antibiotics  Plan  Continue supportive therapy with support hose and elevation of the legs  No antibiotics at this time.

## 2011-11-14 ENCOUNTER — Ambulatory Visit: Payer: Medicare Other | Admitting: Internal Medicine

## 2011-11-21 ENCOUNTER — Other Ambulatory Visit: Payer: Self-pay | Admitting: *Deleted

## 2011-11-21 MED ORDER — DILTIAZEM HCL ER COATED BEADS 180 MG PO CP24: 180.0000 mg | ORAL_CAPSULE | Freq: Every day | ORAL | Status: DC

## 2011-11-21 MED ORDER — LEVOTHYROXINE SODIUM 50 MCG PO TABS: 50.0000 ug | ORAL_TABLET | Freq: Every day | ORAL | Status: DC

## 2011-11-21 MED ORDER — PHENYTOIN SODIUM EXTENDED 100 MG PO CAPS: ORAL_CAPSULE | ORAL | Status: DC

## 2011-11-21 MED ORDER — MESALAMINE 1.2 G PO TBEC: 1.2000 g | DELAYED_RELEASE_TABLET | Freq: Four times a day (QID) | ORAL | Status: DC

## 2011-11-21 MED ORDER — PRAVASTATIN SODIUM 40 MG PO TABS: 40.0000 mg | ORAL_TABLET | Freq: Every evening | ORAL | Status: DC

## 2011-11-21 NOTE — Telephone Encounter (Signed)
[facility] sent to [facility] and Rx phenobarbital phoned in. Patient husband aware of this.

## 2011-11-22 ENCOUNTER — Telehealth: Payer: Self-pay | Admitting: *Deleted

## 2011-11-22 NOTE — Telephone Encounter (Signed)
Physical therapist Selena Batten with patient visited patient this week  States patient is not being compliant with therapy. Working with her to use walker only. At visit  PT and family hid all canes so she could not use. They did this due to her cognitive behavior and delayed reaction to fall or catch herself. PT therapist state if patient continues to be non compliant with use of walker only will have to dismiss her from therapy. PT to check on her again next week. Physical therapist suggest to taper some of her seizure medications to see if this would improve her balance since she has not had a seizure in years. Please advise.

## 2011-12-02 ENCOUNTER — Telehealth: Payer: Self-pay | Admitting: *Deleted

## 2011-12-02 DIAGNOSIS — R270 Ataxia, unspecified: Secondary | ICD-10-CM

## 2011-12-02 NOTE — Telephone Encounter (Signed)
Physical therapist Genevieve Norlander called on patient of concerns of still very unsteady in her balance. Requesting to continue 2 x week for 2weeks of therapy with her and suggest change in her medication for seizure to see if this would improve her balance. Patient husband would like referral to neurologist also

## 2011-12-04 NOTE — Telephone Encounter (Signed)
1. Ok to extend physical therapy 2. Referral place with [facility] for neuro consult

## 2011-12-05 NOTE — Telephone Encounter (Signed)
Left message on mobile phone for Kyla Balzarine PT with [facility] to return call concerning order for patient

## 2011-12-15 ENCOUNTER — Telehealth: Payer: Self-pay | Admitting: *Deleted

## 2011-12-15 NOTE — Telephone Encounter (Signed)
Speech therapist with [facility]  Called for patient to extend speech therapy for 2 x week for 2 weeks and one time week for one week. Please advise

## 2011-12-15 NOTE — Telephone Encounter (Signed)
Left message on [facility] nurse # [phone] of VO to extend speech therapy for patient

## 2011-12-15 NOTE — Telephone Encounter (Signed)
Ok to give verbal to extend therapy

## 2011-12-23 ENCOUNTER — Telehealth: Payer: Self-pay | Admitting: Internal Medicine

## 2011-12-23 NOTE — Telephone Encounter (Signed)
Caller: Doug/Spouse; PCP: Illene Regulus; CB#: [phone]; Calling today 12/23/11 regarding Urinary Pain; onset this AM.  Afebrile.  Emergent symptoms r/o by Urinary Symptoms Female guidelines with exception of has one or more UTI symptoms and has not been previously evaluated. Care advice given. Advised husband wife will need to be seen within 24 hours.  No appt available today, advised husband to call back after 1 PM today and we can schedule an appt for tomorrow (Saturday) 12/24/11.  Husband verbalized understanding and will call back after 1 Pm for a Saturday appt.

## 2011-12-24 ENCOUNTER — Ambulatory Visit (INDEPENDENT_AMBULATORY_CARE_PROVIDER_SITE_OTHER): Payer: Medicare Other | Admitting: Family Medicine

## 2011-12-24 ENCOUNTER — Encounter: Payer: Self-pay | Admitting: Family Medicine

## 2011-12-24 ENCOUNTER — Ambulatory Visit: Payer: Medicare Other | Admitting: Internal Medicine

## 2011-12-24 VITALS — BP 130/68 | Temp 98.6°F | Wt 118.0 lb

## 2011-12-24 DIAGNOSIS — N390 Urinary tract infection, site not specified: Secondary | ICD-10-CM

## 2011-12-24 DIAGNOSIS — I739 Peripheral vascular disease, unspecified: Secondary | ICD-10-CM

## 2011-12-24 DIAGNOSIS — R300 Dysuria: Secondary | ICD-10-CM

## 2011-12-24 LAB — POCT URINALYSIS DIPSTICK
Bilirubin, UA: NEGATIVE
Ketones, UA: NEGATIVE
Spec Grav, UA: 1.015
pH, UA: 6

## 2011-12-24 MED ORDER — CIPROFLOXACIN HCL 250 MG PO TABS: 250.0000 mg | ORAL_TABLET | Freq: Two times a day (BID) | ORAL | Status: AC

## 2011-12-24 NOTE — Patient Instructions (Signed)
Looks like urinary infection - treat with cipro twice daily for 3 days.  Update [place] if symptoms not better with this. Urine culture sent today Push water and cranberry juice.

## 2011-12-24 NOTE — Progress Notes (Signed)
  Subjective:    Patient ID: Loanne Drilling, female    DOB: [birth year], 71 y.o.   MRN: [id number]  HPI CC: ?UTI  Presents with husband who helps give story 2/2 voice.  2d h/o dysuria whenever urinates, urgency.  Also noted increased frequency (but has been increasing tea use).    No fevers/chills, n/v, abd or flank pain, hematuria.  H/o UTIs in past, last was last year 2012 H/o c diff and UC.  Review of Systems Per HPI    Objective:   Physical Exam  Nursing note and vitals reviewed. Constitutional: She appears well-developed and well-nourished. No distress.  Abdominal: Soft. Bowel sounds are normal. She exhibits no distension and no mass. There is no tenderness. There is no rebound, no guarding and no CVA tenderness.  Skin: Skin is warm and dry. No rash noted.  Psychiatric: She has a normal mood and affect.       Assessment & Plan:

## 2011-12-24 NOTE — Assessment & Plan Note (Signed)
Anticipate uncomplicated cystitis - treat with cipro 250mg  bid for 3 days. Update [place] if sxs not improved with treatment. UCx sent today.

## 2011-12-27 ENCOUNTER — Telehealth: Payer: Self-pay | Admitting: *Deleted

## 2011-12-27 NOTE — Telephone Encounter (Signed)
Patient  Husband called concerned have not heard from scheduling for referral to nuerologist. He had to bring her in on Saturday clinic for UTI. And was told to call back concerning this. See in notes for [facility] dated 12/21/2011 waiting on appt from neurologist . Mr. [name] to call tomorrow to [facility] to check on this but wanted you to know how long this has taken to get referral

## 2011-12-28 DIAGNOSIS — Z0279 Encounter for issue of other medical certificate: Secondary | ICD-10-CM

## 2011-12-29 NOTE — Telephone Encounter (Signed)
Please find out if the patient was ever scheduled for neurology and let me know.

## 2011-12-29 NOTE — Telephone Encounter (Signed)
Yes referral was sent on 12/21/2011 by [facility] Gavin Pound, referral was sent again by fax and called [facility] per [facility] Deborah to be given an appt. Date and time

## 2012-01-02 DIAGNOSIS — Z0279 Encounter for issue of other medical certificate: Secondary | ICD-10-CM

## 2012-01-06 ENCOUNTER — Telehealth: Payer: Self-pay | Admitting: General Practice

## 2012-01-06 ENCOUNTER — Other Ambulatory Visit: Payer: Self-pay | Admitting: Endocrinology

## 2012-01-06 MED ORDER — LIDOCAINE HCL 2 % EX GEL: CUTANEOUS | Status: DC | PRN

## 2012-01-06 NOTE — Telephone Encounter (Deleted)
Pt would like refill for lidocaine.  [facility] on [address].

## 2012-01-16 ENCOUNTER — Encounter: Payer: Self-pay | Admitting: Internal Medicine

## 2012-01-17 ENCOUNTER — Encounter: Payer: Self-pay | Admitting: Internal Medicine

## 2012-01-17 ENCOUNTER — Ambulatory Visit (INDEPENDENT_AMBULATORY_CARE_PROVIDER_SITE_OTHER): Payer: Medicare Other | Admitting: Internal Medicine

## 2012-01-17 VITALS — BP 110/62 | HR 80 | Temp 98.3°F | Resp 16 | Wt 121.0 lb

## 2012-01-17 DIAGNOSIS — Z8679 Personal history of other diseases of the circulatory system: Secondary | ICD-10-CM

## 2012-01-17 DIAGNOSIS — I679 Cerebrovascular disease, unspecified: Secondary | ICD-10-CM

## 2012-01-17 DIAGNOSIS — R279 Unspecified lack of coordination: Secondary | ICD-10-CM

## 2012-01-17 NOTE — Progress Notes (Signed)
Subjective:    Patient ID: [name], female    DOB: March 10, 1941, 71 y.o.   MRN: [id number]  HPI [name] presents for evaluation of balance difficulty. She has a complex history including head and neck surgery for cancer. Her home PT felt she had a balance problem, called the office and recommended a neuro evaluation. The patient had not been seen for this problem in this office. Through a complicated process [facility] declined to schedule since they had received no records pertaining to any primary neurologic problem.   She had a craniotomy for SDH in the early 90's She had a seizure related to that surgery and has been on phenobarbital and dilantin since. She did have a massive CVA in July '00 with subsequent ataxia.   MRI January 10, 1999: IMPRESSION  1. EXTENSIVE LEFT HEMISPHERIC INFARCT INVOLVING THE LEFT TEMPORAL LOBE, LEFT POSTERIOR PARIETAL  LOBE, AND LEFT OCCIPITAL LOBE AS WELL AS THE LEFT THALAMUS; THE MAJORITY OF THIS INFARCT LIES  WITHIN THE LEFT POSTERIOR CEREBRAL ARTERY TERRITORY, ALTHOUGH A PORTION OF THE LEFT MIDDLE CEREBRAL  ARTERY TERRITORY IS OBVIOUSLY ALSO AFFECTED.  2. ATROPHY AND SMALL VESSEL DISEASE.  3. 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  4. ASYMMETRIC CEREBELLAR HEMISPHERES, LEFT SMALLER THAN RIGHT, DUE TO A MEGACISTERNA MAGNA.  5. NO EVIDENCE FOR INTRACRANIAL HEMORRHAGE.  6. DIFFUSE MENINGEAL ENHANCEMENT, LIKELY RELATED TO A PREVIOUS CRANIOTOMY WITH MORE PROMINENT  THICKENING OF THE MENINGES ON THE LEFT, THE SITE OF PREVIOUS SURGERY.  7. OLD LEFT-SIDED CRANIOTOMY WITHOUT EVIDENCE FOR RESIDUAL OR RECURRENT MASS; THE REASON FOR THIS  PREVIOUS SURGERY IS UNKNOWN.      Over time her balance did improve. After her fall and pelvic fracture in March '12 and a fall with broken hip in September '12 her balance has been worse. She has not had any new neurologic events documented.   She has multiple  hospital admissions for medical problems but not for neurologic issues.   IN the interval since she was last hospitalized in April she has seen Dr. Manson Passey at [facility] and is doing really well with no signs of residual cancer. She has been gaining weight.   She has had a mammogram. Has seen her gynecologist.   Past Medical History  Diagnosis Date  . Peripheral vascular disease   . Subdural hematoma   . Ataxia   . Mild dysplasia of cervix   . UTI (lower urinary tract infection)   . Urinary incontinence   . Seizures   . Cerebrovascular disease   . Hypothyroidism   . Personal history of colonic polyps     adenomatous 1997 & tubular adenoma and hyplastic  2008  . Hyperlipidemia   . Adenocarcinoma, breast     bilateral  . H/O alcohol abuse   . Diverticulosis of colon (without mention of hemorrhage)   . Redundant colon   . Squamous cell carcinoma of mouth   . Stroke   . C. difficile colitis   . Dementia t  . Ulcerative colitis    Past Surgical History  Procedure Date  . Subdural hematoma evacuation via craniotomy   . 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 Multiple tooth extractions     due to oral cancer  . Cataract extraction, bilateral   . Breast lumpectomy     left breast with radiation therapy  . Carotid endarterectomy     left  . Mastectomy  Right, history with nodule dissection  . Orif hip fracture Sept '12    Right hip: screw and plate repair.  . [facility]arynx surgery 08/2010    [facility]   Family History  Problem Relation Age of Onset  . Coronary artery disease Mother   . Heart disease Mother   . Diabetes Sister   . Breast cancer Maternal Aunt   . Breast cancer Sister    History   Social History  . Marital Status: Married    Spouse Name: N/A    Number of Children: 2  . Years of Education: N/A   Occupational History  . retired     Sales executive   Social History Main Topics  . Smoking status: Former Smoker    Quit date: 10/26/2004  .  Smokeless tobacco: Never Used  . Alcohol Use: No     normally 1 glass wine after dinner - none x 2.5 months  . Drug Use: No  . Sexually Active: Not Currently   Other Topics Concern  . Not on file   Social History Narrative   HSG. Married x 7 years divorced; married 12-01-67. 1 son- died as a neonate, 1 son- 12-01-2058. Retired- worked as a Sales executive.   End of life: has a living will- does not want futile/ heroic care; no cpr.  Marriage- reports marriage is in good health (4/10)   Current Outpatient Prescriptions on File Prior to Visit  Medication Sig Dispense Refill  . acetaminophen (TYLENOL) 650 MG CR tablet Take 650 mg by mouth every 8 (eight) hours as needed.      Marland Kitchen aspirin 81 MG chewable tablet Place 1 tablet (81 mg total) into feeding tube daily.  30 tablet  11  . calcium carbonate (OS-CAL) 600 MG TABS Take 600 mg by mouth daily.      Marland Kitchen diltiazem (CARDIZEM CD) 180 MG 24 hr capsule Take 1 capsule (180 mg total) by mouth daily.  90 capsule  3  . folic acid (FOLVITE) 1 MG tablet Take 1 tablet (1 mg total) by mouth daily.  90 tablet  1  . levothyroxine (SYNTHROID, LEVOTHROID) 50 MCG tablet Take 1 tablet (50 mcg total) by mouth daily.  90 tablet  3  . lidocaine (XYLOCAINE) 2 % jelly Apply topically as needed. VIA G tube  30 mL  0  . Mesalamine 4 GM/60ML SF ENEM Place one rectally BID  60 enema  0  . PHENobarbital (LUMINAL) 97.2 MG tablet Take 1 tablet (97.2 mg total) by mouth 2 (two) times daily.  180 tablet  1  . phenytoin (DILANTIN) 100 MG ER capsule Take 3 capsules po QHS  180 capsule  3  . pravastatin (PRAVACHOL) 40 MG tablet Take 1 tablet (40 mg total) by mouth every evening.  90 tablet  3  . vitamin C (ASCORBIC ACID) 500 MG tablet Take 500 mg by mouth daily.      . mesalamine (LIALDA) 1.2 G EC tablet Take 1 tablet (1.2 g total) by mouth 4 (four) times daily.  120 tablet  5        Review of Systems System review is negative for any constitutional, cardiac, pulmonary, GI or neuro  symptoms or complaints other than as described in the HPI.     Objective:   Physical Exam Filed Vitals:   01/17/12 1115  BP: 110/62  Pulse: 80  Temp: 98.3 F (36.8 C)  Resp: 16   Gen'l- older white woman who is thin. HEENT- no  signs of trauma Neck - trach plug in place Cor- 2+ RADIAL pulse, RRR Pulm - normal respirations Neuro - awake and alert. Speech - cannot phonate after laryngectomy. CN II-XII: normal fascial symmetry and movement, no tongue deviation of fasciculations, normal shoulder shrug. MS - 4/5 and equal throughout. Cerebellar - normal finger to nose, normal rapid finger movement, no dysdiadochokinesia. Has retropulsion when standing at station; no pronator drift; ataxic gait, inability to tandem gait.        Assessment & Plan:

## 2012-01-18 NOTE — Assessment & Plan Note (Signed)
Patient with h/o CVAs and craniotomy. She had a problem with ataxia in 2000 but had real improvement. Since her recent surgery she has had recurrent problems with ataxia. Exam today reveals marked problem. Confusion about referral cleared up: request came from [facility] RN and was forwarded to [facility] w/o benefit of office evaluation, thus previous history was not in record and there had been no exam. She does have marked ataxia, has had several falls and would benefit from evaluation.  Patient had a seizure at the time of her craniotomy and has been on medication since. She and her husband have concern about the on-going need for anti-seizure medication.  Plan- Re attempt neurology referral.

## 2012-01-24 ENCOUNTER — Other Ambulatory Visit: Payer: Self-pay | Admitting: Neurology

## 2012-01-24 DIAGNOSIS — E039 Hypothyroidism, unspecified: Secondary | ICD-10-CM

## 2012-01-24 DIAGNOSIS — C329 Malignant neoplasm of larynx, unspecified: Secondary | ICD-10-CM

## 2012-01-24 DIAGNOSIS — I629 Nontraumatic intracranial hemorrhage, unspecified: Secondary | ICD-10-CM

## 2012-01-24 DIAGNOSIS — R269 Unspecified abnormalities of gait and mobility: Secondary | ICD-10-CM

## 2012-01-24 DIAGNOSIS — Z8669 Personal history of other diseases of the nervous system and sense organs: Secondary | ICD-10-CM

## 2012-01-25 ENCOUNTER — Telehealth: Payer: Self-pay | Admitting: Gastroenterology

## 2012-01-26 MED ORDER — MESALAMINE 1.2 G PO TBEC: 1.2000 g | DELAYED_RELEASE_TABLET | Freq: Four times a day (QID) | ORAL | Status: DC

## 2012-01-26 NOTE — Telephone Encounter (Signed)
Notified husband pt may advance her diet as tolerated; he stated understanding.

## 2012-01-26 NOTE — Telephone Encounter (Signed)
Advance diet as tolerated.  Continue Lialda, samples as much as possible.

## 2012-01-26 NOTE — Telephone Encounter (Signed)
Pt's husband reports pt has maxed out and is in the donut hole; do we have any samples of Lialda. Gave pt samples of Lialda. He states the colitis hasn't cleared, pt still has some urgency. The bleeding stopped about 2 months ago. He states she is being evaluated by Dr Terrace Arabia, neurologist, to stop the dilantin. Right now she is on a tapering dose after her EEG; she will have labs and an MRI this weekend. Pt remains on a low roughage diet, but would really love to have salad. Dr [name], can we upgrade her diet? Thanks.

## 2012-01-28 ENCOUNTER — Ambulatory Visit
Admission: RE | Admit: 2012-01-28 | Discharge: 2012-01-28 | Disposition: A | Discharge status: Home / Self Care | Payer: Medicare Other | Source: Ambulatory Visit | Attending: Neurology | Admitting: Neurology

## 2012-01-28 ENCOUNTER — Other Ambulatory Visit: Payer: Self-pay | Admitting: Neurology

## 2012-01-28 DIAGNOSIS — E039 Hypothyroidism, unspecified: Secondary | ICD-10-CM

## 2012-01-28 DIAGNOSIS — I629 Nontraumatic intracranial hemorrhage, unspecified: Secondary | ICD-10-CM

## 2012-01-28 DIAGNOSIS — R269 Unspecified abnormalities of gait and mobility: Secondary | ICD-10-CM

## 2012-01-28 DIAGNOSIS — Z8669 Personal history of other diseases of the nervous system and sense organs: Secondary | ICD-10-CM

## 2012-01-28 DIAGNOSIS — C329 Malignant neoplasm of larynx, unspecified: Secondary | ICD-10-CM

## 2012-02-07 ENCOUNTER — Other Ambulatory Visit: Payer: Self-pay | Admitting: *Deleted

## 2012-02-07 NOTE — Telephone Encounter (Signed)
CALLED [facility] TO VERIFY IF Rx LIDOCAINE HAD BEEN CALLED IN TO BE REFILLED. PHARMACIST STATES YES IS ON FILE .

## 2012-02-20 ENCOUNTER — Ambulatory Visit: Payer: Medicare Other | Attending: Neurology | Admitting: *Deleted

## 2012-02-20 DIAGNOSIS — R293 Abnormal posture: Secondary | ICD-10-CM | POA: Insufficient documentation

## 2012-02-20 DIAGNOSIS — R269 Unspecified abnormalities of gait and mobility: Secondary | ICD-10-CM | POA: Insufficient documentation

## 2012-02-20 DIAGNOSIS — IMO0001 Reserved for inherently not codable concepts without codable children: Secondary | ICD-10-CM | POA: Insufficient documentation

## 2012-02-22 NOTE — Telephone Encounter (Signed)
Opened in error

## 2012-02-29 ENCOUNTER — Ambulatory Visit: Payer: Medicare Other | Admitting: *Deleted

## 2012-03-02 ENCOUNTER — Ambulatory Visit: Payer: Medicare Other | Admitting: *Deleted

## 2012-03-07 ENCOUNTER — Ambulatory Visit: Payer: Medicare Other | Attending: Neurology | Admitting: *Deleted

## 2012-03-07 DIAGNOSIS — R269 Unspecified abnormalities of gait and mobility: Secondary | ICD-10-CM | POA: Insufficient documentation

## 2012-03-07 DIAGNOSIS — IMO0001 Reserved for inherently not codable concepts without codable children: Secondary | ICD-10-CM | POA: Insufficient documentation

## 2012-03-07 DIAGNOSIS — R293 Abnormal posture: Secondary | ICD-10-CM | POA: Insufficient documentation

## 2012-03-08 ENCOUNTER — Ambulatory Visit: Payer: Medicare Other | Admitting: *Deleted

## 2012-03-12 ENCOUNTER — Ambulatory Visit: Payer: Medicare Other | Admitting: Physical Therapy

## 2012-03-14 ENCOUNTER — Ambulatory Visit: Payer: Medicare Other | Admitting: Physical Therapy

## 2012-03-15 ENCOUNTER — Ambulatory Visit (INDEPENDENT_AMBULATORY_CARE_PROVIDER_SITE_OTHER): Payer: Medicare Other | Admitting: *Deleted

## 2012-03-15 DIAGNOSIS — Z23 Encounter for immunization: Secondary | ICD-10-CM

## 2012-03-18 ENCOUNTER — Emergency Department (HOSPITAL_COMMUNITY): Payer: Medicare Other

## 2012-03-18 ENCOUNTER — Inpatient Hospital Stay (HOSPITAL_COMMUNITY)
Admission: EM | Admit: 2012-03-18 | Discharge: 2012-03-20 | DRG: 101 | Disposition: A | Discharge status: Home Health | Payer: Medicare Other | Attending: Internal Medicine | Admitting: Internal Medicine

## 2012-03-18 ENCOUNTER — Encounter (HOSPITAL_COMMUNITY): Payer: Self-pay | Admitting: Emergency Medicine

## 2012-03-18 DIAGNOSIS — I959 Hypotension, unspecified: Secondary | ICD-10-CM | POA: Diagnosis present

## 2012-03-18 DIAGNOSIS — G40802 Other epilepsy, not intractable, without status epilepticus: Principal | ICD-10-CM | POA: Diagnosis present

## 2012-03-18 DIAGNOSIS — Z9119 Patient's noncompliance with other medical treatment and regimen: Secondary | ICD-10-CM

## 2012-03-18 DIAGNOSIS — G8190 Hemiplegia, unspecified affecting unspecified side: Secondary | ICD-10-CM

## 2012-03-18 DIAGNOSIS — R569 Unspecified convulsions: Secondary | ICD-10-CM

## 2012-03-18 DIAGNOSIS — E871 Hypo-osmolality and hyponatremia: Secondary | ICD-10-CM

## 2012-03-18 DIAGNOSIS — G9389 Other specified disorders of brain: Secondary | ICD-10-CM | POA: Diagnosis present

## 2012-03-18 DIAGNOSIS — I10 Essential (primary) hypertension: Secondary | ICD-10-CM

## 2012-03-18 DIAGNOSIS — Z91199 Patient's noncompliance with other medical treatment and regimen due to unspecified reason: Secondary | ICD-10-CM

## 2012-03-18 DIAGNOSIS — C060 Malignant neoplasm of cheek mucosa: Secondary | ICD-10-CM | POA: Diagnosis present

## 2012-03-18 DIAGNOSIS — Z8521 Personal history of malignant neoplasm of larynx: Secondary | ICD-10-CM

## 2012-03-18 DIAGNOSIS — D72829 Elevated white blood cell count, unspecified: Secondary | ICD-10-CM | POA: Diagnosis present

## 2012-03-18 DIAGNOSIS — R9431 Abnormal electrocardiogram [ECG] [EKG]: Secondary | ICD-10-CM

## 2012-03-18 DIAGNOSIS — E876 Hypokalemia: Secondary | ICD-10-CM

## 2012-03-18 DIAGNOSIS — Z853 Personal history of malignant neoplasm of breast: Secondary | ICD-10-CM

## 2012-03-18 LAB — COMPREHENSIVE METABOLIC PANEL
ALT: 12 U/L (ref 0–35)
AST: 14 U/L (ref 0–37)
Albumin: 3.1 g/dL — ABNORMAL LOW (ref 3.5–5.2)
Alkaline Phosphatase: 120 U/L — ABNORMAL HIGH (ref 39–117)
CO2: 21 mEq/L (ref 19–32)
Chloride: 88 mEq/L — ABNORMAL LOW (ref 96–112)
GFR calc non Af Amer: 85 mL/min — ABNORMAL LOW (ref >90)
Potassium: 3.3 mEq/L — ABNORMAL LOW (ref 3.5–5.1)
Total Bilirubin: 0.3 mg/dL (ref 0.3–1.2)

## 2012-03-18 LAB — TROPONIN I
Troponin I: <0.3 ng/mL (ref <0.30)
Troponin I: <0.3 ng/mL (ref <0.30)

## 2012-03-18 LAB — CBC WITH DIFFERENTIAL/PLATELET
Band Neutrophils: 0 % (ref 0–10)
Blasts: 0 %
HCT: 34.5 % — ABNORMAL LOW (ref 36.0–46.0)
Lymphocytes Relative: 4 % — ABNORMAL LOW (ref 12–46)
Lymphs Abs: 0.8 10*3/uL (ref 0.7–4.0)
MCHC: 35.4 g/dL (ref 30.0–36.0)
Metamyelocytes Relative: 0 %
Monocytes Absolute: 1 10*3/uL (ref 0.1–1.0)
Monocytes Relative: 5 % (ref 3–12)
Platelets: 178 10*3/uL (ref 150–400)
Promyelocytes Absolute: 0 %
RDW: 15 % (ref 11.5–15.5)
WBC: 19.6 10*3/uL — ABNORMAL HIGH (ref 4.0–10.5)
nRBC: 0 /100 WBC

## 2012-03-18 LAB — URINALYSIS, MICROSCOPIC ONLY
Bilirubin Urine: NEGATIVE
Glucose, UA: NEGATIVE mg/dL
Nitrite: NEGATIVE
Specific Gravity, Urine: 1.011 (ref 1.005–1.030)
pH: 6.5 (ref 5.0–8.0)

## 2012-03-18 LAB — OSMOLALITY, URINE: Osmolality, Ur: 272 mOsm/kg — ABNORMAL LOW (ref 390–1090)

## 2012-03-18 LAB — MRSA PCR SCREENING: MRSA by PCR: NEGATIVE

## 2012-03-18 LAB — CREATININE, URINE, RANDOM: Creatinine, Urine: 10.6 mg/dL

## 2012-03-18 MED ORDER — ENSURE PLUS PO LIQD
237.0000 mL | Freq: Three times a day (TID) | ORAL | Status: DC
  Administered 2012-03-18 (×2): 237 mL via ORAL
  Filled 2012-03-18 (×5): qty 237

## 2012-03-18 MED ORDER — ACETAMINOPHEN 650 MG RE SUPP: 650.0000 mg | Freq: Four times a day (QID) | RECTAL | Status: DC | PRN

## 2012-03-18 MED ORDER — MESALAMINE 1.2 G PO TBEC
2.4000 g | DELAYED_RELEASE_TABLET | Freq: Two times a day (BID) | ORAL | Status: DC
  Administered 2012-03-18 – 2012-03-20 (×5): 2.4 g via ORAL
  Filled 2012-03-18 (×6): qty 2

## 2012-03-18 MED ORDER — ONDANSETRON HCL 4 MG PO TABS: 4.0000 mg | ORAL_TABLET | Freq: Four times a day (QID) | ORAL | Status: DC | PRN

## 2012-03-18 MED ORDER — POTASSIUM CHLORIDE 10 MEQ/100ML IV SOLN
10.0000 meq | INTRAVENOUS | Status: AC
  Administered 2012-03-18 (×4): 10 meq via INTRAVENOUS
  Filled 2012-03-18 (×3): qty 100

## 2012-03-18 MED ORDER — SODIUM CHLORIDE 0.9 % IV SOLN
1000.0000 mg | Freq: Once | INTRAVENOUS | Status: AC
  Administered 2012-03-18: 1000 mg via INTRAVENOUS
  Filled 2012-03-18: qty 10

## 2012-03-18 MED ORDER — FOLIC ACID 1 MG PO TABS
1.0000 mg | ORAL_TABLET | Freq: Every day | ORAL | Status: DC
  Administered 2012-03-18 – 2012-03-20 (×3): 1 mg via ORAL
  Filled 2012-03-18 (×3): qty 1

## 2012-03-18 MED ORDER — ALBUTEROL SULFATE (5 MG/ML) 0.5% IN NEBU: 2.5000 mg | INHALATION_SOLUTION | RESPIRATORY_TRACT | Status: DC | PRN

## 2012-03-18 MED ORDER — SIMVASTATIN 20 MG PO TABS
20.0000 mg | ORAL_TABLET | Freq: Every day | ORAL | Status: DC
  Administered 2012-03-18 – 2012-03-19 (×2): 20 mg via ORAL
  Filled 2012-03-18 (×3): qty 1

## 2012-03-18 MED ORDER — SODIUM CHLORIDE 0.9 % IV SOLN
1000.0000 mL | Freq: Once | INTRAVENOUS | Status: AC
  Administered 2012-03-18: 1000 mL via INTRAVENOUS

## 2012-03-18 MED ORDER — LEVETIRACETAM 500 MG PO TABS
500.0000 mg | ORAL_TABLET | Freq: Two times a day (BID) | ORAL | Status: DC
Start: 2012-03-18 — End: 2012-03-20
  Administered 2012-03-18 – 2012-03-20 (×5): 500 mg via ORAL
  Filled 2012-03-18 (×6): qty 1

## 2012-03-18 MED ORDER — ONDANSETRON HCL 4 MG/2ML IJ SOLN: 4.0000 mg | Freq: Four times a day (QID) | INTRAMUSCULAR | Status: DC | PRN

## 2012-03-18 MED ORDER — ASPIRIN 81 MG PO CHEW
81.0000 mg | CHEWABLE_TABLET | Freq: Every day | ORAL | Status: DC
  Administered 2012-03-18 – 2012-03-20 (×3): 81 mg
  Filled 2012-03-18 (×3): qty 1

## 2012-03-18 MED ORDER — HYDROCODONE-ACETAMINOPHEN 5-325 MG PO TABS
1.0000 | ORAL_TABLET | ORAL | Status: DC | PRN
  Administered 2012-03-19 – 2012-03-20 (×2): 1 via ORAL
  Filled 2012-03-18 (×2): qty 1

## 2012-03-18 MED ORDER — SODIUM CHLORIDE 0.9 % IV SOLN
1000.0000 mL | INTRAVENOUS | Status: DC
  Administered 2012-03-18 – 2012-03-20 (×3): 1000 mL via INTRAVENOUS

## 2012-03-18 MED ORDER — ALUM & MAG HYDROXIDE-SIMETH 200-200-20 MG/5ML PO SUSP: 30.0000 mL | Freq: Four times a day (QID) | ORAL | Status: DC | PRN

## 2012-03-18 MED ORDER — LEVOTHYROXINE SODIUM 50 MCG PO TABS
50.0000 ug | ORAL_TABLET | Freq: Every day | ORAL | Status: DC
  Administered 2012-03-18 – 2012-03-20 (×3): 50 ug via ORAL
  Filled 2012-03-18 (×5): qty 1

## 2012-03-18 MED ORDER — SODIUM CHLORIDE 0.9 % IJ SOLN
3.0000 mL | Freq: Two times a day (BID) | INTRAMUSCULAR | Status: DC
  Administered 2012-03-18 – 2012-03-19 (×3): 3 mL via INTRAVENOUS

## 2012-03-18 MED ORDER — PANTOPRAZOLE SODIUM 40 MG PO TBEC
40.0000 mg | DELAYED_RELEASE_TABLET | Freq: Every day | ORAL | Status: DC
  Administered 2012-03-18 – 2012-03-19 (×2): 40 mg via ORAL
  Filled 2012-03-18 (×3): qty 1

## 2012-03-18 MED ORDER — ACETAMINOPHEN 325 MG PO TABS: 650.0000 mg | ORAL_TABLET | Freq: Four times a day (QID) | ORAL | Status: DC | PRN

## 2012-03-18 MED ORDER — LIDOCAINE HCL 2 % EX GEL: CUTANEOUS | Status: DC | PRN

## 2012-03-18 MED ORDER — DOCUSATE SODIUM 100 MG PO CAPS
100.0000 mg | ORAL_CAPSULE | Freq: Two times a day (BID) | ORAL | Status: DC
  Administered 2012-03-19 (×2): 100 mg via ORAL
  Filled 2012-03-18 (×6): qty 1

## 2012-03-18 MED ORDER — POTASSIUM CHLORIDE 10 MEQ/100ML IV SOLN
INTRAVENOUS | Status: AC
  Administered 2012-03-18: 10 meq via INTRAVENOUS
  Filled 2012-03-18: qty 100

## 2012-03-18 MED ORDER — BENZOCAINE 10 % MT GEL
OROMUCOSAL | Status: DC | PRN
  Filled 2012-03-18: qty 9.4

## 2012-03-18 MED ORDER — GUAIFENESIN-DM 100-10 MG/5ML PO SYRP: 5.0000 mL | ORAL_SOLUTION | ORAL | Status: DC | PRN

## 2012-03-18 NOTE — ED Notes (Signed)
ICU Charge RN, Debbe Bales, called to get info on pt. Said RN would call back in 10 minutes for report.

## 2012-03-18 NOTE — ED Notes (Addendum)
Per EMS, pt was at home with her husband when he heard banging on the bed. He went to check on her and she was actively seizing. Pt was semi-responsive and having leg twitches when they arrived. While on the stretcher, pt had another grand mal seizure with EMS present. Pt in a postictal state. Pty has hx of seizures but last was over 30 years ago. Pt has been on Keppra since but ran out yesterday so has missed her morning & evening doses. Pt received 2.5 Versed enroute. BP on scene after seizure was 162/90. Pt has hx of afib.

## 2012-03-18 NOTE — ED Notes (Signed)
Pt has a throat stoma r/t throat cancer.

## 2012-03-18 NOTE — Progress Notes (Signed)
Pt was in the bed and wanted to go to the chair since we needed to change her sheet. She is alert and oriented, knows how to use the call bell, frequently uses call bell for toileting. Pt was in the chair, near the nurses station, call bell within reach. Pt had just been toileted twice within 10 minutes, five minutes prior to the fall. She was found sitting on the ground. She states "I did not fall I was trying to get to the bed, and sat on the ground." She said she was not hurt, no apparent injuries. Pt's family was called. Husband said she "can be impulsive." Dr. Nehemiah Settle was called "Continue to monitor patient." Pt was assisted back to bed, bed exit alarm set. Call bell within reach. Pt is already close to the nurse's station.

## 2012-03-18 NOTE — ED Notes (Signed)
GNF:AOZH<YQ> Expected date:03/18/12<BR> Expected time: 1:33 AM<BR> Means of arrival:Ambulance<BR> Comments:<BR> Res B: Seizure, post-ictal, hx of seizures in past. Out of meds.

## 2012-03-18 NOTE — ED Provider Notes (Signed)
History     CSN: [id number]  Arrival date & time 03/18/12  0159   First MD Initiated Contact with Patient 03/18/12 0218       chief complaint: Seizure  The history is provided by the patient, the spouse and the EMS personnel.   as reported by both husband and the EMS that the patient has been in her normal state health for the past several days and does have a known history of seizure disorder but has not been taking her Her for the past and a half.  As reported that she had a tonic-clonic seizure this evening that was witnessed by the husband.  EMS was called.  He reports she did turn blue around the lips.  She has not had a seizure in a very long time but she has always been compliant with her medications.  No recent head trauma.  No recent vomiting or fevers.  She's been in otherwise eating and drinking normally.  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  She has a stoma in place.  As reported that the patient's mental status by the time she has arrived the emergency department is significantly improved and was on EMS arrival to the house.  Her symptoms are mild to moderate in severity.   Past Medical History  Diagnosis Date  . Peripheral vascular disease   . Subdural hematoma   . Ataxia   . Mild dysplasia of cervix   . UTI (lower urinary tract infection)   . Urinary incontinence   . Seizures   . Cerebrovascular disease   . Hypothyroidism   . Personal history of colonic polyps     adenomatous 1997 & tubular adenoma and hyplastic  2008  . Hyperlipidemia   . Adenocarcinoma, breast     bilateral  . H/O alcohol abuse   . Diverticulosis of colon (without mention of hemorrhage)   . Redundant colon   . Squamous cell carcinoma of mouth   . Stroke   . C. difficile colitis   . Dementia t  . Ulcerative colitis     Past Surgical History  Procedure Date  . Subdural hematoma evacuation via craniotomy   . 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  Multiple tooth extractions     due to oral cancer  . Cataract extraction, bilateral   . Breast lumpectomy     left breast with radiation therapy  . Carotid endarterectomy     left  . Mastectomy     Right, history with nodule dissection  . Orif hip fracture Sept '12    Right hip: screw and plate repair.  . Larynx surgery 08/2010    [facility]    Family History  Problem Relation Age of Onset  . Coronary artery disease Mother   . Heart disease Mother   . Diabetes Sister   . Breast cancer Maternal Aunt   . Breast cancer Sister     History  Substance Use Topics  . Smoking status: Former Smoker    Quit date: 10/26/2004  . Smokeless tobacco: Never Used  . Alcohol Use: No     normally 1 glass wine after dinner - none x 2.5 months    OB History    Grav Para Term Preterm Abortions TAB SAB Ect Mult Living                  Review of Systems  All other systems reviewed and  are negative.    Allergies  Review of patient's allergies indicates no known allergies.  Home Medications   Current Outpatient Rx  Name Route Sig Dispense Refill  . ASPIRIN 81 MG PO CHEW Per Tube Place 1 tablet (81 mg total) into feeding tube daily. 30 tablet 11  . CALCIUM CARBONATE 600 MG PO TABS Oral Take 600 mg by mouth daily.    Marland Kitchen DILTIAZEM HCL ER COATED BEADS 180 MG PO CP24 Oral Take 1 capsule (180 mg total) by mouth daily. 90 capsule 3  . ENSURE PLUS PO LIQD Oral Take 237 mLs by mouth 3 (three) times daily between meals.     Marland Kitchen FOLIC ACID 1 MG PO TABS Oral Take 1 tablet (1 mg total) by mouth daily. 90 tablet 1  . LEVETIRACETAM 500 MG PO TABS Oral Take 500 mg by mouth every 12 (twelve) hours.    Marland Kitchen LEVOTHYROXINE SODIUM 50 MCG PO TABS Oral Take 1 tablet (50 mcg total) by mouth daily. 90 tablet 3  . LIDOCAINE HCL 2 % EX GEL Topical Apply topically as needed. VIA G tube 30 mL 0  . MESALAMINE 1.2 G PO TBEC Oral Take 2.4 g by mouth 2 (two) times daily.    . ADULT MULTIVITAMIN W/MINERALS CH Oral Take 1  tablet by mouth daily.    Marland Kitchen PRAVASTATIN SODIUM 40 MG PO TABS Oral Take 1 tablet (40 mg total) by mouth every evening. 90 tablet 3  . VITAMIN C 500 MG PO TABS Oral Take 500 mg by mouth daily.      BP 90/43  Pulse 84  Resp 21  SpO2 92%  Physical Exam  Nursing note and vitals reviewed. Constitutional: She is oriented to person, place, and time. She appears well-developed and well-nourished. No distress.  HENT:  Head: Normocephalic and atraumatic.       Stoma  Eyes: EOM are normal. Pupils are equal, round, and reactive to light.  Neck: Normal range of motion.  Cardiovascular: Normal rate, regular rhythm and normal heart sounds.   Pulmonary/Chest: Effort normal and breath sounds normal.  Abdominal: Soft. She exhibits no distension. There is no tenderness.  Musculoskeletal: Normal range of motion.  Neurological: She is alert and oriented to person, place, and time.       5/5 strength in major muscle groups of  bilateral upper and lower extremities. . No facial asymetry.   Skin: Skin is warm and dry.  Psychiatric: She has a normal mood and affect. Judgment normal.    ED Course  Procedures (including critical care time)  Labs Reviewed  CBC WITH DIFFERENTIAL - Abnormal; Notable for the following:    WBC 19.6 (*)  WHITE COUNT CONFIRMED ON SMEAR   HCT 34.5 (*)     Neutrophils Relative 90 (*)     Lymphocytes Relative 4 (*)     Neutro Abs 17.6 (*)     All other components within normal limits  COMPREHENSIVE METABOLIC PANEL - Abnormal; Notable for the following:    Sodium 125 (*)     Potassium 3.3 (*)     Chloride 88 (*)     Glucose, Bld 119 (*)     Albumin 3.1 (*)     Alkaline Phosphatase 120 (*)     GFR calc non Af Amer 85 (*)     All other components within normal limits  PROTIME-INR - Abnormal; Notable for the following:    Prothrombin Time 16.1 (*)     All other components  within normal limits  LACTIC ACID, PLASMA - Abnormal; Notable for the following:    Lactic Acid,  Venous 6.3 (*)     All other components within normal limits  URINALYSIS, WITH MICROSCOPIC - Abnormal; Notable for the following:    Hgb urine dipstick TRACE (*)     Protein, ur 100 (*)     All other components within normal limits  CULTURE, BLOOD (ROUTINE X 2)  CULTURE, BLOOD (ROUTINE X 2)  URINE CULTURE   Ct Head Wo Contrast  03/18/2012  *RADIOLOGY REPORT*  Clinical Data: Seizure  CT HEAD WITHOUT CONTRAST  Technique:  Contiguous axial images were obtained from the base of the skull through the vertex without contrast.  Comparison: 01/28/2012 MRI, 03/15/2011 CT  Findings: Status post left frontal parietal temporal craniectomy. Low attenuation along the cerebral convexity on the left is similar to prior.  Marked dilatation of the left greater than right lateral ventricles, similar to prior.  No intraparenchymal hemorrhage, mass, mass effect, or new extra-axial fluid collection.  Left basal ganglial mineralization is unchanged.  No definite CT evidence of acute infarction.  There is sequelae of right frontal infarct. Periventricular and subcortical white matter hypodensities are most in keeping with chronic microangiopathic change. The visualized paranasal sinuses and mastoid air cells are predominately clear.  IMPRESSION: Chronic changes of left craniectomy, encephalomalacia, and ventricular dilatation.  No definite evidence of acute intracranial abnormality.   Original Report Authenticated By: Waneta Martins, M.D.     I personally reviewed the imaging tests through PACS system  I reviewed available ER/hospitalization records thought the EMR   1. Seizure   2. Hyponatremia   3. Leucocytosis       MDM  The patient seems to be returning to baseline mental status at this time.  Will omit the patient for IV hydration given her hyponatremia as well as her hypertension on arrival.  Don't believe this to be sepsis or meningitis.  I think this is seizure secondary to noncompliance with her  anticonvulsant medications.  I likely would've the patient be volume depleted as the cause of her hyponatremia and low blood pressure.  It seems to respond quickly to IV fluids.  The patient will be admitted to observation overnight.  5:42 AM Spoke with hospitalist who will evaluate at bedside        Lyanne Co, MD 03/18/12 [id number]

## 2012-03-18 NOTE — ED Notes (Signed)
EKG printed and given to [facility] for review. Last confirmed EKG printed for comparison

## 2012-03-18 NOTE — H&P (Signed)
PCP:   Illene Regulus, MD  ENT  Dr. Manson Passey at [facility] Dr. Terrace Arabia at [facility]  Chief Complaint:   seizure  HPI: [name] is a 71 y.o. female   has a past medical history of Peripheral vascular disease; Subdural hematoma; Ataxia; Mild dysplasia of cervix; UTI (lower urinary tract infection); Urinary incontinence; Seizures; Cerebrovascular disease; Hypothyroidism; Personal history of colonic polyps; Hyperlipidemia; Adenocarcinoma, breast; H/O alcohol abuse; Diverticulosis of colon (without mention of hemorrhage); Redundant colon; Squamous cell carcinoma of mouth; Stroke; C. difficile colitis; Dementia (t); and Ulcerative colitis.   Presented with  She had an episode of grand mal seizure today after she run out of her keppra. It started at 1:15 initiated on the right. Her husband was awakened from sleep because she was obtained on the headboard in her room. He came to see that her right side was  drown up then proceeded to whole body rhythmic shaking. He is unsure if she was incontinent of urine. 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 He last seizure epiosde was associated with the CVA. But she has been on Keppra ever since unfortunately due to the patient's confusion about how much medication she has her Keppra was not refilled on time. After the seizure patient was postictal but now she has improved. She has hx of laryngeal cancer sp tracheostomy. She has remote hx of EtOH abuse but currently only drinks one glass a day and her alcohol use is tightly controlled by her husband.  She has hx of UC and have had some recent diarrhea no recent steroids. NO fever at home, no chest pain.   Review of Systems:    Pertinent positives include: diarrhea, productive cough,  Constitutional:  No weight loss, night sweats, Fevers, chills, fatigue, weight loss  HEENT:  No headaches, Difficulty swallowing,Tooth/dental problems,Sore throat,  No sneezing, itching, ear ache,  nasal congestion, post nasal drip,  Cardio-vascular:  No chest pain, Orthopnea, PND, anasarca, dizziness, palpitations.no Bilateral lower extremity swelling  GI:  No heartburn, indigestion, abdominal pain, nausea, vomiting,change in bowel habits, loss of appetite, melena, blood in stool, hematemesis Resp:  no shortness of breath at rest. No dyspnea on exertion, No excess mucus, no  No non-productive cough, No coughing up of blood.No change in color of mucus.No wheezing. Skin:  no rash or lesions. No jaundice GU:  no dysuria, change in color of urine, no urgency or frequency. No straining to urinate.  No flank pain.  Musculoskeletal:  No joint pain or no joint swelling. No decreased range of motion. No back pain.  Psych:  No change in mood or affect. No depression or anxiety. No memory loss.  Neuro: no localizing neurological complaints, no tingling, no weakness, no double vision, no gait abnormality, no slurred speech, no confusion  Otherwise ROS are negative except for above, 10 systems were reviewed  Past Medical History: Past Medical History  Diagnosis Date  . Peripheral vascular disease   . Subdural hematoma   . Ataxia   . Mild dysplasia of cervix   . UTI (lower urinary tract infection)   . Urinary incontinence   . Seizures   . Cerebrovascular disease   . Hypothyroidism   . Personal history of colonic polyps     adenomatous 1997 & tubular adenoma and hyplastic  2008  . Hyperlipidemia   . Adenocarcinoma, breast     bilateral  . H/O alcohol abuse   . Diverticulosis of colon (without mention of  hemorrhage)   . Redundant colon   . Squamous cell carcinoma of mouth   . Stroke   . C. difficile colitis   . Dementia t  . Ulcerative colitis    Past Surgical History  Procedure Date  . Subdural hematoma evacuation via craniotomy   . 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 Multiple tooth extractions     due to oral cancer  . Cataract extraction, bilateral    . Breast lumpectomy     left breast with radiation therapy  . Carotid endarterectomy     left  . Mastectomy     Right, history with nodule dissection  . Orif hip fracture Sept '12    Right hip: screw and plate repair.  . Larynx surgery 08/2010    [facility]     Medications: Prior to Admission medications   Medication Sig Start Date End Date Taking? Authorizing Provider  aspirin 81 MG chewable tablet Place 1 tablet (81 mg total) into feeding tube daily. 10/31/11 10/30/12 Yes Jacques Navy, MD  calcium carbonate (OS-CAL) 600 MG TABS Take 600 mg by mouth daily.   Yes Historical Provider, MD  diltiazem (CARDIZEM CD) 180 MG 24 hr capsule Take 1 capsule (180 mg total) by mouth daily. 11/21/11  Yes Jacques Navy, MD  Ensure Plus (ENSURE PLUS) LIQD Take 237 mLs by mouth 3 (three) times daily between meals.    Yes Historical Provider, MD  folic acid (FOLVITE) 1 MG tablet Take 1 tablet (1 mg total) by mouth daily. 11/11/11  Yes Jacques Navy, MD  levETIRAcetam (KEPPRA) 500 MG tablet Take 500 mg by mouth every 12 (twelve) hours.   Yes Historical Provider, MD  levothyroxine (SYNTHROID, LEVOTHROID) 50 MCG tablet Take 1 tablet (50 mcg total) by mouth daily. 11/21/11  Yes Jacques Navy, MD  lidocaine (XYLOCAINE) 2 % jelly Apply topically as needed. VIA G tube 01/06/12  Yes Romero Belling, MD  mesalamine (LIALDA) 1.2 G EC tablet Take 2.4 g by mouth 2 (two) times daily. 01/26/12 01/25/13 Yes Beverley Fiedler, MD  Multiple Vitamin (MULTIVITAMIN WITH MINERALS) TABS Take 1 tablet by mouth daily.   Yes Historical Provider, MD  pravastatin (PRAVACHOL) 40 MG tablet Take 1 tablet (40 mg total) by mouth every evening. 11/21/11  Yes Jacques Navy, MD  vitamin C (ASCORBIC ACID) 500 MG tablet Take 500 mg by mouth daily.   Yes Historical Provider, MD    Allergies:  No Known Allergies  Social History:  Ambulatory with cane Lives at   Home with husband   reports that she quit smoking about 7 years ago. She has  never used smokeless tobacco. She reports that she does not drink alcohol or use illicit drugs.   Family History: family history includes Breast cancer in her maternal aunt and sister; Coronary artery disease in her mother; Diabetes in her sister; and Heart disease in her mother.    Physical Exam: Patient Vitals for the past 24 hrs:  BP Pulse Resp SpO2  03/18/12 0311 - 84  21  92 %  03/18/12 0211 90/43 mmHg - - -  03/18/12 0210 85/47 mmHg 87  20  93 %  03/18/12 0209 - - - 98 %    1. General:  in No Acute distress 2. Psychological: Alert and  Oriented 3. Head/ENT:     Dry Mucous Membranes  Head Non traumatic, neck supple                           Poor Dentition Neck with tracheostomy in place 4. SKIN:   decreased Skin turgor,  Skin clean Dry and intact no rash 5. Heart: Regular rate and rhythm no Murmur, Rub or gallop 6. Lungs: Clear to auscultation bilaterally, no wheezes or crackles   7. Abdomen: Soft, non-tender, Non distended 8. Lower extremities: no clubbing, cyanosis, or edema 9. Neurologically Grossly intact, moving all 4 extremities equally, and equal grips bilaterally lower extremity strength intact no droop noted cranial nerves II through XII appear to be intact 10. MSK: Normal range of motion  body mass index is unknown because there is no height or weight on file.   Labs on Admission:   [facility] 03/18/12 0231  NA 125*  K 3.3*  CL 88*  CO2 21  GLUCOSE 119*  BUN 10  CREATININE 0.67  CALCIUM 9.0  MG --  PHOS --    Basename 03/18/12 0231  AST 14  ALT 12  ALKPHOS 120*  BILITOT 0.3  PROT 6.3  ALBUMIN 3.1*   No results found for this basename: LIPASE:2,AMYLASE:2 in the last 72 hours  Basename 03/18/12 0231  WBC 19.6*  NEUTROABS 17.6*  HGB 12.2  HCT 34.5*  MCV 88.9  PLT 178   No results found for this basename: CKTOTAL:3,CKMB:3,CKMBINDEX:3,TROPONINI:3 in the last 72 hours No results found for this basename:  TSH,T4TOTAL,FREET3,T3FREE,THYROIDAB in the last 72 hours No results found for this basename: VITAMINB12:2,FOLATE:2,FERRITIN:2,TIBC:2,IRON:2,RETICCTPCT:2 in the last 72 hours Lab Results  Component Value Date   HGBA1C 5.5 05/06/2011    The CrCl is unknown because both a height and weight (above a minimum accepted value) are required for this calculation. ABG    Component Value Date/Time   PHART 7.457* 10/28/2011 0232   HCO3 27.5* 10/28/2011 0232   TCO2 25.6 10/28/2011 0232   O2SAT 87.3 10/28/2011 0232     No results found for this basename: DDIMER     Other results:  I have pearsonaly reviewed this: ECG REPORT  Rate: 85  Rhythm: possibly we'll wondering pacemaker, variety of different P waves noted ST&T Change: no ischemic changes  UA no evidence of infection   Cultures:    Component Value Date/Time   SDES BLOOD LEFT HAND 03/15/2011 1015   SPECREQUEST BOTTLES DRAWN AEROBIC ONLY 1CC 03/15/2011 1015   CULT NO GROWTH 5 DAYS 03/15/2011 1015   REPTSTATUS 03/21/2011 FINAL 03/15/2011 1015       Radiological Exams on Admission: Ct Head Wo Contrast  03/18/2012  *RADIOLOGY REPORT*  Clinical Data: Seizure  CT HEAD WITHOUT CONTRAST  Technique:  Contiguous axial images were obtained from the base of the skull through the vertex without contrast.  Comparison: 01/28/2012 MRI, 03/15/2011 CT  Findings: Status post left frontal parietal temporal craniectomy. Low attenuation along the cerebral convexity on the left is similar to prior.  Marked dilatation of the left greater than right lateral ventricles, similar to prior.  No intraparenchymal hemorrhage, mass, mass effect, or new extra-axial fluid collection.  Left basal ganglial mineralization is unchanged.  No definite CT evidence of acute infarction.  There is sequelae of right frontal infarct. Periventricular and subcortical white matter hypodensities are most in keeping with chronic microangiopathic change. The visualized paranasal sinuses and  mastoid air cells are predominately clear.  IMPRESSION: Chronic changes of left craniectomy, encephalomalacia, and ventricular dilatation.  No definite evidence of acute intracranial abnormality.   Original Report Authenticated By: Waneta Martins, M.D.     Chart has been reviewed  Assessment/Plan  71 year old female with history of CVA in the past chronically on Keppra for an episode of seizure now presents with grand mal seizure in the setting of running out of Keppra, hyponatremia, hypotension and elevated White blood cell count  Present on Admission:  .Seizure - likely multifactorial in origin. Patient has had history of hemorrhagic CVA in the past. Being off of Keppra, possible have an infection in combination with hyponatremia has lowered her seizure threshold. Blood in the mid to step down secondary to hypertension. Restart her Keppra she was loaded in emergency department. Consider neurology consult.  Marland KitchenHypotensive episode - etiology unclear patient has not been febrile but she does appear to be clinically somewhat dehydrated. Will continue IV fluids await results of blood cultures at this point there is no signs of infection she is a febrile. The watch her in step down .Hyponatremia - patient has history of chronic hyponatremia in this case she appeared to be hypotensive and and somewhat dehydrated will give her with fluids we'll also obtain urine osmolarity and urine sodium follow sodium levels. Since patient is chronically somewhat hyponatremic I doubt that that is the cause of her seizure .Hypokalemia - will replace .Abnormal ECG - EKG does not appear to have  atrial fibrillation variable P waves are present. we'll obtain echo gram. Consider cardiology consult if this persists .Larynx cancer - patient is status post tracheostomy stable .Leukocytosis - etiology unclear could be a stress response of the seizure tract infections could not be completely ruled out so far patient is a febrile  urine appears to be normal but the chest x-ray and blood cultures hold off on antibiotics for now.   Prophylaxis: SCD  Protonix  CODE STATUS: FULL CODE as per husband  Other plan as per orders.  I have spent a total of 60 min on this admission, left message for Dr. Smitty Knudsen 03/18/2012, 5:49 AM

## 2012-03-18 NOTE — ED Notes (Addendum)
Report given to ICU RN. Room not yet available. Will call when bed is ready.

## 2012-03-18 NOTE — ED Notes (Signed)
Tried to call report to ICU. Not ready to take report. Will call back.

## 2012-03-18 NOTE — Progress Notes (Signed)
Subjective: Interval History: Denies headache, dizziness, dysnpea.   Objective: Vital signs in last 24 hours: Temp:  [97.8 F (36.6 C)-98.7 F (37.1 C)] 98.7 F (37.1 C) (09/15 2000) Pulse Rate:  [69-87] 86  (09/15 2006) Resp:  [15-23] 23  (09/15 2006) BP: (85-153)/(43-95) 148/65 mmHg (09/15 2000) SpO2:  [92 %-100 %] 100 % (09/15 2006) FiO2 (%):  [28 %-35 %] 28 % (09/15 2006)  Intake/Output from previous day:   Intake/Output this shift:    BP 148/65  Pulse 86  Temp 98.7 F (37.1 C) (Oral)  Resp 23  SpO2 100% General appearance: alert, cooperative and no distress Lungs: clear to auscultation bilaterally Heart: regular rate and rhythm and sinus on monitor Neurologic: no focal deficits, no seizure activity, communicates with assistance of communication board as patient has tracheostomy.  Results for orders placed during the hospital encounter of 03/18/12 (from the past 24 hour(s))  CBC WITH DIFFERENTIAL     Status: Abnormal   Collection Time   03/18/12  2:31 AM      Component Value Range   WBC 19.6 (*) 4.0 - 10.5 K/uL   RBC 3.88  3.87 - 5.11 MIL/uL   Hemoglobin 12.2  12.0 - 15.0 g/dL   HCT 40.9 (*) 81.1 - 91.4 %   MCV 88.9  78.0 - 100.0 fL   MCH 31.4  26.0 - 34.0 pg   MCHC 35.4  30.0 - 36.0 g/dL   RDW 78.2  95.6 - 21.3 %   Platelets 178  150 - 400 K/uL   Neutrophils Relative 90 (*) 43 - 77 %   Lymphocytes Relative 4 (*) 12 - 46 %   Monocytes Relative 5  3 - 12 %   Eosinophils Relative 1  0 - 5 %   Basophils Relative 0  0 - 1 %   Band Neutrophils 0  0 - 10 %   Metamyelocytes Relative 0     Myelocytes 0     Promyelocytes Absolute 0     Blasts 0     nRBC 0  0 /100 WBC   Neutro Abs 17.6 (*) 1.7 - 7.7 K/uL   Lymphs Abs 0.8  0.7 - 4.0 K/uL   Monocytes Absolute 1.0  0.1 - 1.0 K/uL   Eosinophils Absolute 0.2  0.0 - 0.7 K/uL   Basophils Absolute 0.0  0.0 - 0.1 K/uL   WBC Morphology TOXIC GRANULATION     Smear Review LARGE PLATELETS PRESENT    COMPREHENSIVE METABOLIC  PANEL     Status: Abnormal   Collection Time   03/18/12  2:31 AM      Component Value Range   Sodium 125 (*) 135 - 145 mEq/L   Potassium 3.3 (*) 3.5 - 5.1 mEq/L   Chloride 88 (*) 96 - 112 mEq/L   CO2 21  19 - 32 mEq/L   Glucose, Bld 119 (*) 70 - 99 mg/dL   BUN 10  6 - 23 mg/dL   Creatinine, Ser 0.86  0.50 - 1.10 mg/dL   Calcium 9.0  8.4 - 57.8 mg/dL   Total Protein 6.3  6.0 - 8.3 g/dL   Albumin 3.1 (*) 3.5 - 5.2 g/dL   AST 14  0 - 37 U/L   ALT 12  0 - 35 U/L   Alkaline Phosphatase 120 (*) 39 - 117 U/L   Total Bilirubin 0.3  0.3 - 1.2 mg/dL   GFR calc non Af Amer 85 (*) >90 mL/min   GFR  calc Af Amer >90  >90 mL/min  PROTIME-INR     Status: Abnormal   Collection Time   03/18/12  2:31 AM      Component Value Range   Prothrombin Time 16.1 (*) 11.6 - 15.2 seconds   INR 1.26  0.00 - 1.49  LACTIC ACID, PLASMA     Status: Abnormal   Collection Time   03/18/12  2:31 AM      Component Value Range   Lactic Acid, Venous 6.3 (*) 0.5 - 2.2 mmol/L  URINALYSIS, WITH MICROSCOPIC     Status: Abnormal   Collection Time   03/18/12  3:45 AM      Component Value Range   Color, Urine YELLOW  YELLOW   APPearance CLEAR  CLEAR   Specific Gravity, Urine 1.011  1.005 - 1.030   pH 6.5  5.0 - 8.0   Glucose, UA NEGATIVE  NEGATIVE mg/dL   Hgb urine dipstick TRACE (*) NEGATIVE   Bilirubin Urine NEGATIVE  NEGATIVE   Ketones, ur NEGATIVE  NEGATIVE mg/dL   Protein, ur 161 (*) NEGATIVE mg/dL   Urobilinogen, UA 0.2  0.0 - 1.0 mg/dL   Nitrite NEGATIVE  NEGATIVE   Leukocytes, UA NEGATIVE  NEGATIVE   RBC / HPF 0-2  <3 RBC/hpf  TROPONIN I     Status: Normal   Collection Time   03/18/12 10:08 AM      Component Value Range   Troponin I <0.30  <0.30 ng/mL  MRSA PCR SCREENING     Status: Normal   Collection Time   03/18/12 10:09 AM      Component Value Range   MRSA by PCR NEGATIVE  NEGATIVE  SODIUM, URINE, RANDOM     Status: Normal   Collection Time   03/18/12  3:55 PM      Component Value Range   Sodium,  Ur 111    OSMOLALITY, URINE     Status: Abnormal   Collection Time   03/18/12  3:55 PM      Component Value Range   Osmolality, Ur 272 (*) 390 - 1090 mOsm/kg  CREATININE, URINE, RANDOM     Status: Normal   Collection Time   03/18/12  3:55 PM      Component Value Range   Creatinine, Urine 10.6    TROPONIN I     Status: Normal   Collection Time   03/18/12  4:24 PM      Component Value Range   Troponin I <0.30  <0.30 ng/mL    Studies/Results: Dg Chest 2 View  03/18/2012  *RADIOLOGY REPORT*  Clinical Data: Fever  CHEST - 2 VIEW  Comparison: 10/27/2011  Findings: Surgical clips bilateral axilla post right mastectomy. Surgical clips also projecting over the superior mediastinum at the thoracic inlet and right neck.  Mild peribronchial thickening. Heart size upper normal to mildly enlarged.  Aortic arch atherosclerosis.  No pleural effusion or pneumothorax.  IMPRESSION: Mild peribronchial thickening without focal consolidation, may reflect bronchitis.   Original Report Authenticated By: Waneta Martins, M.D.    Ct Head Wo Contrast  03/18/2012  *RADIOLOGY REPORT*  Clinical Data: Seizure  CT HEAD WITHOUT CONTRAST  Technique:  Contiguous axial images were obtained from the base of the skull through the vertex without contrast.  Comparison: 01/28/2012 MRI, 03/15/2011 CT  Findings: Status post left frontal parietal temporal craniectomy. Low attenuation along the cerebral convexity on the left is similar to prior.  Marked dilatation of the left greater than right  lateral ventricles, similar to prior.  No intraparenchymal hemorrhage, mass, mass effect, or new extra-axial fluid collection.  Left basal ganglial mineralization is unchanged.  No definite CT evidence of acute infarction.  There is sequelae of right frontal infarct. Periventricular and subcortical white matter hypodensities are most in keeping with chronic microangiopathic change. The visualized paranasal sinuses and mastoid air cells are  predominately clear.  IMPRESSION: Chronic changes of left craniectomy, encephalomalacia, and ventricular dilatation.  No definite evidence of acute intracranial abnormality.   Original Report Authenticated By: Waneta Martins, M.D.    Gna Rad Results  02/21/2012  Ordered by an unspecified provider.   Gna Rad Results  02/21/2012  Ordered by an unspecified provider.   Dg Foot 2 Views Right  03/18/2012  *RADIOLOGY REPORT*  Clinical Data: Pain radiating over foot  RIGHT FOOT - 2 VIEW  Comparison: None.  Findings: Osteopenia.  Subtle nondisplaced fracture of the distal phalanx of the great toe is suspected on the lateral view.  Age is indeterminate.  Bony framework is otherwise intact.  IMPRESSION: Nondisplaced fracture of the distal phalanx of the great toe of indeterminate age.   Original Report Authenticated By: Donavan Burnet, M.D.     Scheduled Meds:   . sodium chloride  1,000 mL Intravenous Once  . aspirin  81 mg Per Tube Daily  . docusate sodium  100 mg Oral BID  . Ensure Plus  237 mL Oral TID BM  . folic acid  1 mg Oral Daily  . levetiracetam  1,000 mg Intravenous Once  . levETIRAcetam  500 mg Oral Q12H  . levothyroxine  50 mcg Oral QAC breakfast  . mesalamine  2.4 g Oral BID  . pantoprazole  40 mg Oral Q1200  . potassium chloride  10 mEq Intravenous Q1 Hr x 4  . simvastatin  20 mg Oral q1800  . sodium chloride  3 mL Intravenous Q12H   Continuous Infusions:   . sodium chloride 1,000 mL (03/18/12 2000)   PRN Meds:acetaminophen, acetaminophen, albuterol, alum & mag hydroxide-simeth, benzocaine, guaiFENesin-dextromethorphan, HYDROcodone-acetaminophen, ondansetron (ZOFRAN) IV, ondansetron, DISCONTD: lidocaine  Assessment/Plan: Seizure activity: admitted after seizure activity at home likely multifactorial due to being off Keppra, hyponatremia, and possible infection. No seizure activity in ED or after admission. Electrolytes were replaced and she has been afebrile. Stable for  transfer to telemetry unit. Continue current orders.   LOS: 0 days   Prentiss Polio A.

## 2012-03-19 ENCOUNTER — Inpatient Hospital Stay (HOSPITAL_COMMUNITY): Payer: Medicare Other

## 2012-03-19 ENCOUNTER — Ambulatory Visit: Payer: Medicare Other | Admitting: Physical Therapy

## 2012-03-19 DIAGNOSIS — G8190 Hemiplegia, unspecified affecting unspecified side: Secondary | ICD-10-CM

## 2012-03-19 DIAGNOSIS — I10 Essential (primary) hypertension: Secondary | ICD-10-CM

## 2012-03-19 LAB — URINE CULTURE: Culture: NO GROWTH

## 2012-03-19 LAB — CBC
HCT: 37.2 % (ref 36.0–46.0)
Hemoglobin: 13.2 g/dL (ref 12.0–15.0)
MCV: 88.6 fL (ref 78.0–100.0)
RBC: 4.2 MIL/uL (ref 3.87–5.11)
RDW: 14.8 % (ref 11.5–15.5)
WBC: 12.3 10*3/uL — ABNORMAL HIGH (ref 4.0–10.5)

## 2012-03-19 LAB — COMPREHENSIVE METABOLIC PANEL
Albumin: 3.6 g/dL (ref 3.5–5.2)
Alkaline Phosphatase: 129 U/L — ABNORMAL HIGH (ref 39–117)
BUN: 4 mg/dL — ABNORMAL LOW (ref 6–23)
Calcium: 8.9 mg/dL (ref 8.4–10.5)
Potassium: 3.3 mEq/L — ABNORMAL LOW (ref 3.5–5.1)
Total Protein: 6.9 g/dL (ref 6.0–8.3)

## 2012-03-19 LAB — MAGNESIUM: Magnesium: 1.8 mg/dL (ref 1.5–2.5)

## 2012-03-19 LAB — PHOSPHORUS: Phosphorus: 3.1 mg/dL (ref 2.3–4.6)

## 2012-03-19 MED ORDER — POTASSIUM CHLORIDE CRYS ER 20 MEQ PO TBCR
40.0000 meq | EXTENDED_RELEASE_TABLET | Freq: Two times a day (BID) | ORAL | Status: AC
  Administered 2012-03-19 – 2012-03-20 (×2): 40 meq via ORAL
  Filled 2012-03-19 (×3): qty 2

## 2012-03-19 MED ORDER — ENSURE COMPLETE PO LIQD
237.0000 mL | Freq: Three times a day (TID) | ORAL | Status: DC
  Administered 2012-03-19 – 2012-03-20 (×4): 237 mL via ORAL

## 2012-03-19 NOTE — Progress Notes (Signed)
   CARE MANAGEMENT NOTE 03/19/2012  Patient:  [name], [name]   Account Number:  [id number]  Date Initiated:  03/19/2012  Documentation initiated by:  Jiles Crocker  Subjective/Objective Assessment:   ADMITTED WITH SEIZURE ACTIVITY     Action/Plan:   PCP: Illene Regulus, MD  ENT  Dr. Manson Passey at [facility]  Dr. Terrace Arabia at [facility];  LIVES WITH SPOUSE/ INDEPENDENT PRIOR TO ADMISSION   Anticipated DC Date:  03/22/2012   Anticipated DC Plan:  HOME/SELF CARE      DC Planning Services  CM consult               Status of service:  In process, will continue to follow Medicare Important Message given?  NA - LOS <3 / Initial given by admissions (If response is "NO", the following Medicare IM given date fields will be blank)  Per UR Regulation:  Reviewed for med. necessity/level of care/duration of stay  Comments:  03/19/2012- B Ramiel Forti RN, BSN, MHA

## 2012-03-19 NOTE — Progress Notes (Signed)
Patient has not had any seizure activity since being admitted and appears to be awake and alert although continues to be delusional in regard to her husband.  Patient refused lab this AM: CBC, Bmet needed since she had significant leukocytosis at admission, most likely demargination after seizure and a low sodium of 125. Needs to be cleared prior to d/c home.

## 2012-03-19 NOTE — Progress Notes (Signed)
  Echocardiogram 2D Echocardiogram has been performed.  [name] 03/19/2012, 11:37 AM

## 2012-03-19 NOTE — Consult Note (Signed)
Reason for Consult:Seizure  Referring Physician: Norins  CC: Seizure  HPI: [name] is an 71 y.o. female with a history of hemorrhagic infarct requiring craniotomy.  Patient had a seizure at that time.  By report of husband was on Dilantin and Phenobarbital.  This was tapered by Dr. Terrace Arabia and patient was started on Keppra.  Patient has done well on Keppra until this admission.  It seems that there was some difficulty getting her Keppra filled and the patient missed three doses of Keppra.  Was noted by her husband to have a generalized tonic-clonic seizure at 0130 in AM on day of presentation.   Now with complaints of cramping in her right leg, involuntary movements of her right leg and inability to bear weight on the right.  Also reports that objects appear to be shaking.  This is all per Dr. Debby Bud note.  Patient did not want to speak to me and cooperated minimally for her exam-reported that she was tired of all of this.     Past Medical History  Diagnosis Date  . Peripheral vascular disease   . Subdural hematoma   . Ataxia   . Mild dysplasia of cervix   . UTI (lower urinary tract infection)   . Urinary incontinence   . Seizures   . Cerebrovascular disease   . Hypothyroidism   . Personal history of colonic polyps     adenomatous 1997 & tubular adenoma and hyplastic  2008  . Hyperlipidemia   . Adenocarcinoma, breast     bilateral  . H/O alcohol abuse   . Diverticulosis of colon (without mention of hemorrhage)   . Redundant colon   . Squamous cell carcinoma of mouth   . Stroke   . C. difficile colitis   . Dementia t  . Ulcerative colitis     Past Surgical History  Procedure Date  . Subdural hematoma evacuation via craniotomy   . 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 Multiple tooth extractions     due to oral cancer  . Cataract extraction, bilateral   . Breast lumpectomy     left breast with radiation therapy  . Carotid endarterectomy     left  .  Mastectomy     Right, history with nodule dissection  . Orif hip fracture Sept '12    Right hip: screw and plate repair.  . Larynx surgery 08/2010    [facility]    Family History  Problem Relation Age of Onset  . Coronary artery disease Mother   . Heart disease Mother   . Diabetes Sister   . Breast cancer Maternal Aunt   . Breast cancer Sister     Social History:  reports that she quit smoking about 7 years ago. She has never used smokeless tobacco. She reports that she does not drink alcohol or use illicit drugs.  No Known Allergies  Medications:  I have reviewed the patient's current medications. Prior to Admission:  Prescriptions prior to admission  Medication Sig Dispense Refill  . aspirin 81 MG chewable tablet Place 1 tablet (81 mg total) into feeding tube daily.  30 tablet  11  . calcium carbonate (OS-CAL) 600 MG TABS Take 600 mg by mouth daily.      Marland Kitchen diltiazem (CARDIZEM CD) 180 MG 24 hr capsule Take 1 capsule (180 mg total) by mouth daily.  90 capsule  3  . Ensure Plus (ENSURE PLUS) LIQD Take 237 mLs by mouth 3 (  three) times daily between meals.       . folic acid (FOLVITE) 1 MG tablet Take 1 tablet (1 mg total) by mouth daily.  90 tablet  1  . levETIRAcetam (KEPPRA) 500 MG tablet Take 500 mg by mouth every 12 (twelve) hours.      Marland Kitchen levothyroxine (SYNTHROID, LEVOTHROID) 50 MCG tablet Take 1 tablet (50 mcg total) by mouth daily.  90 tablet  3  . lidocaine (XYLOCAINE) 2 % jelly Apply topically as needed. VIA G tube  30 mL  0  . mesalamine (LIALDA) 1.2 G EC tablet Take 2.4 g by mouth 2 (two) times daily.      . Multiple Vitamin (MULTIVITAMIN WITH MINERALS) TABS Take 1 tablet by mouth daily.      . pravastatin (PRAVACHOL) 40 MG tablet Take 1 tablet (40 mg total) by mouth every evening.  90 tablet  3  . vitamin C (ASCORBIC ACID) 500 MG tablet Take 500 mg by mouth daily.       Scheduled:   . aspirin  81 mg Per Tube Daily  . docusate sodium  100 mg Oral BID  . feeding  supplement  237 mL Oral TID BM  . folic acid  1 mg Oral Daily  . levETIRAcetam  500 mg Oral Q12H  . levothyroxine  50 mcg Oral QAC breakfast  . mesalamine  2.4 g Oral BID  . pantoprazole  40 mg Oral Q1200  . potassium chloride  10 mEq Intravenous Q1 Hr x 4  . potassium chloride  40 mEq Oral BID  . simvastatin  20 mg Oral q1800  . sodium chloride  3 mL Intravenous Q12H  . DISCONTD: Ensure Plus  237 mL Oral TID BM    ROS: Patient would not cooperate  Physical Examination: Blood pressure 119/76, pulse 104, temperature 98.6 F (37 C), temperature source Oral, resp. rate 20, height 5\' 5"  (1.651 m), weight 49.9 kg (110 lb 0.2 oz), SpO2 98.00%.  Neurologic Examination Mental Status: Alert.  Angry.  Often motioned for me to leave.  Able to write fluently.  Followed commands when willing.   Cranial Nerves: II: Discs difficult to visualize. Visual fields grossly normal, pupils equal, round, reactive to light and accommodation III,IV, VI: ptosis not present, extra-ocular motions intact bilaterally grossly V,VII: smile symmetric, facial light touch sensation normal bilaterally VIII: hearing normal bilaterally IX,X: gag reflex present XI: bilateral shoulder shrug XII: midline tongue extension Motor: Right : Upper extremity   5/5    Left:     Upper extremity   5/5  Lower extremity   5/5     Lower extremity   5/5 No preferential use of any extremity noted.  Tone and bulk:normal tone throughout; no atrophy noted Sensory: Pinprick and light touch intact throughout, bilaterally Deep Tendon Reflexes: 1+ and symmetric with absent AJ's bilaterally Plantars: Right: upgoing   Left: upgoing Cerebellar: Normal finger-to-nose intact. Heel to shin with dysmetria bilaterally Gait: unable to test CV: pulses palpable throughout    Laboratory Studies:   Basic Metabolic Panel:  Lab 03/19/12 1610 03/18/12 0231  NA 129* 125*  K 3.3* 3.3*  CL 93* 88*  CO2 26 21  GLUCOSE 113* 119*  BUN 4* 10    CREATININE 0.51 0.67  CALCIUM 8.9 9.0  MG 1.8 --  PHOS 3.1 --    Liver Function Tests:  Lab 03/19/12 0740 03/18/12 0231  AST 21 14  ALT 14 12  ALKPHOS 129* 120*  BILITOT 0.7 0.3  PROT 6.9 6.3  ALBUMIN 3.6 3.1*   No results found for this basename: LIPASE:5,AMYLASE:5 in the last 168 hours No results found for this basename: AMMONIA:3 in the last 168 hours  CBC:  Lab 03/19/12 0740 03/18/12 0231  WBC 12.3* 19.6*  NEUTROABS -- 17.6*  HGB 13.2 12.2  HCT 37.2 34.5*  MCV 88.6 88.9  PLT 184 178    Cardiac Enzymes:  Lab 03/18/12 2156 03/18/12 1624 03/18/12 1008  CKTOTAL -- -- --  CKMB -- -- --  CKMBINDEX -- -- --  TROPONINI <0.30 <0.30 <0.30    BNP: No components found with this basename: POCBNP:5  CBG: No results found for this basename: GLUCAP:5 in the last 168 hours  Microbiology: Results for orders placed during the hospital encounter of 03/18/12  CULTURE, BLOOD (ROUTINE X 2)     Status: Normal (Preliminary result)   Collection Time   03/18/12  2:31 AM      Component Value Range Status Comment   Specimen Description BLOOD LEFT HAND   Final    Special Requests BOTTLES DRAWN AEROBIC ONLY 4CC EACH   Final    Culture  Setup Time 03/18/2012 11:17   Final    Culture     Final    Value:        BLOOD CULTURE RECEIVED NO GROWTH TO DATE CULTURE WILL BE HELD FOR 5 DAYS BEFORE ISSUING A FINAL NEGATIVE REPORT   Report Status PENDING   Incomplete   CULTURE, BLOOD (ROUTINE X 2)     Status: Normal (Preliminary result)   Collection Time   03/18/12  2:45 AM      Component Value Range Status Comment   Specimen Description BLOOD BLOOD LEFT FOREARM   Final    Special Requests BOTTLES DRAWN AEROBIC ONLY UNKNOWN   Final    Culture  Setup Time 03/18/2012 11:17   Final    Culture     Final    Value:        BLOOD CULTURE RECEIVED NO GROWTH TO DATE CULTURE WILL BE HELD FOR 5 DAYS BEFORE ISSUING A FINAL NEGATIVE REPORT   Report Status PENDING   Incomplete   URINE CULTURE     Status:  Normal   Collection Time   03/18/12  3:45 AM      Component Value Range Status Comment   Specimen Description URINE, CATHETERIZED   Final    Special Requests NONE   Final    Culture  Setup Time 03/18/2012 11:16   Final    Colony Count NO GROWTH   Final    Culture NO GROWTH   Final    Report Status 03/19/2012 FINAL   Final   MRSA PCR SCREENING     Status: Normal   Collection Time   03/18/12 10:09 AM      Component Value Range Status Comment   MRSA by PCR NEGATIVE  NEGATIVE Final     Coagulation Studies:  Basename 03/18/12 0231  LABPROT 16.1*  INR 1.26    Urinalysis:  Lab 03/18/12 0345  COLORURINE YELLOW  LABSPEC 1.011  PHURINE 6.5  GLUCOSEU NEGATIVE  HGBUR TRACE*  BILIRUBINUR NEGATIVE  KETONESUR NEGATIVE  PROTEINUR 100*  UROBILINOGEN 0.2  NITRITE NEGATIVE  LEUKOCYTESUR NEGATIVE    Lipid Panel:     Component Value Date/Time   CHOL 173 05/06/2011 1643   TRIG 33.0 05/06/2011 1643   HDL 96.10 05/06/2011 1643   CHOLHDL 2 05/06/2011 1643   VLDL 6.6 05/06/2011 1643  LDLCALC 70 05/06/2011 1643    HgbA1C:  Lab Results  Component Value Date   HGBA1C 5.5 05/06/2011    Urine Drug Screen:     Component Value Date/Time   LABOPIA NONE DETECTED 09/16/2010 2250   COCAINSCRNUR NONE DETECTED 09/16/2010 2250   LABBENZ NONE DETECTED 09/16/2010 2250   AMPHETMU NONE DETECTED 09/16/2010 2250   THCU NONE DETECTED 09/16/2010 2250   LABBARB  Value: POSITIVE        DRUG SCREEN FOR MEDICAL PURPOSES ONLY.  IF CONFIRMATION IS NEEDED FOR ANY PURPOSE, NOTIFY LAB WITHIN 5 DAYS.        LOWEST DETECTABLE LIMITS FOR URINE DRUG SCREEN Drug Class       Cutoff (ng/mL) Amphetamine      1000 Barbiturate      200 Benzodiazepine   200 Tricyclics       300 Opiates          300 Cocaine          300 THC              50* 09/16/2010 2250    Alcohol Level: No results found for this basename: ETH:2 in the last 168 hours  Imaging: Dg Chest 2 View  03/18/2012  *RADIOLOGY REPORT*  Clinical Data: Fever  CHEST - 2  VIEW  Comparison: 10/27/2011  Findings: Surgical clips bilateral axilla post right mastectomy. Surgical clips also projecting over the superior mediastinum at the thoracic inlet and right neck.  Mild peribronchial thickening. Heart size upper normal to mildly enlarged.  Aortic arch atherosclerosis.  No pleural effusion or pneumothorax.  IMPRESSION: Mild peribronchial thickening without focal consolidation, may reflect bronchitis.   Original Report Authenticated By: Waneta Martins, M.D.    Ct Head Wo Contrast  03/18/2012  *RADIOLOGY REPORT*  Clinical Data: Seizure  CT HEAD WITHOUT CONTRAST  Technique:  Contiguous axial images were obtained from the base of the skull through the vertex without contrast.  Comparison: 01/28/2012 MRI, 03/15/2011 CT  Findings: Status post left frontal parietal temporal craniectomy. Low attenuation along the cerebral convexity on the left is similar to prior.  Marked dilatation of the left greater than right lateral ventricles, similar to prior.  No intraparenchymal hemorrhage, mass, mass effect, or new extra-axial fluid collection.  Left basal ganglial mineralization is unchanged.  No definite CT evidence of acute infarction.  There is sequelae of right frontal infarct. Periventricular and subcortical white matter hypodensities are most in keeping with chronic microangiopathic change. The visualized paranasal sinuses and mastoid air cells are predominately clear.  IMPRESSION: Chronic changes of left craniectomy, encephalomalacia, and ventricular dilatation.  No definite evidence of acute intracranial abnormality.   Original Report Authenticated By: Waneta Martins, M.D.    Mr Brain Wo Contrast  03/19/2012  *RADIOLOGY REPORT*  Clinical Data: 71 year old female with new onset of right lower extremity paresthesia and weakness.  Seizure.  History of subdural hematoma requiring craniotomy in 1999.  MRI HEAD WITHOUT CONTRAST  Technique:  Multiplanar, multiecho pulse sequences of  the brain and surrounding structures were obtained according to standard protocol without intravenous contrast.  Comparison: Head CT 03/18/2012.  Brain MRI 01/28/2012.  Findings: Half Study is intermittently degraded by motion artifact despite repeated imaging attempts.  The chronic ex vacuo changes to the ventricles, especially the left lateral ventricle.  Chronic encephalomalacia of the left temporal, parietal, and portions of the left occipital lobe.  There is a small area of diffusion abnormality which may have associated FLAIR hyperintensity  along the medial cortex of the residual left occipital lobe (series 3 image 19).  There may also be similar but less pronounced diffusion abnormality in the medial periRolandic cortex (series 3 image 23).  No mass effect or hemorrhage at these sites.  Sequelae of left frontotemporal craniotomy again noted.  No mass effect.  No midline shift.  Chronic anterior superior right frontal gyrus encephalomalacia again noted.  Basilar cisterns are patent with chronic neck and cisterna magna versus posterior fossa arachnoid cyst. Major intracranial vascular flow voids are stable. Negative pituitary, cervicomedullary junction, and visualized cervical spine.  Normal marrow signal.  Postoperative changes to the globes. Visualized paranasal sinuses and mastoids are clear.  No acute scalp soft tissue findings.  IMPRESSION: 1.  Pronounced chronic encephalomalacia in the left posterior hemisphere.  There is mild diffusion and FLAIR signal abnormality along the medial aspect of the atrophied cortex, and this may reflect recent seizure activity in this setting. Ischemia is less likely.  The right lower extremity motor and sensory region is among the areas affected. 2.  No other acute intracranial abnormality.   Original Report Authenticated By: Harley Hallmark, M.D.    Dg Foot 2 Views Right  03/18/2012  *RADIOLOGY REPORT*  Clinical Data: Pain radiating over foot  RIGHT FOOT - 2 VIEW   Comparison: None.  Findings: Osteopenia.  Subtle nondisplaced fracture of the distal phalanx of the great toe is suspected on the lateral view.  Age is indeterminate.  Bony framework is otherwise intact.  IMPRESSION: Nondisplaced fracture of the distal phalanx of the great toe of indeterminate age.   Original Report Authenticated By: Donavan Burnet, M.D.      Assessment/Plan:  Patient Active Hospital Problem List: Seizure (03/18/2012)   Assessment: Patient started back on her Keppra and has had no further seizures since admission   Plan: Continue Keppra at preadmission dose.   RLE weakness   Assessment:  Patient difficult to assess today secondary to cooperation but showed no focal weakness on exam.  Unclear if she was having any discomfort due to her wish to not talk much with me.  No involuntary movements noted at the time of examination either.  Unclear if symptoms may have been post-ictal (i.e. Todds) and are slowly clearing.  MRI shows no acute ischemic lesion but changes consistent with recent seizure activity.  With underlying abnormalities, such as encephalomalacia patient may recover more slowly from a seizure.     Plan: Continue therapy.     Thana Farr, MD [facility] [phone] 03/19/2012, 4:07 PM

## 2012-03-19 NOTE — Progress Notes (Signed)
Subjective: Called back to see patient for cramping in the right foot along with leg weakness. She does communicate that this has been a problem since she had her seizure the other night.  Objective: Lab: Lab Results  Component Value Date   WBC 12.3* 03/19/2012   HGB 13.2 03/19/2012   HCT 37.2 03/19/2012   MCV 88.6 03/19/2012   PLT 184 03/19/2012   BMET    Component Value Date/Time   NA 129* 03/19/2012 0740   K 3.3* 03/19/2012 0740   CL 93* 03/19/2012 0740   CO2 26 03/19/2012 0740   GLUCOSE 113* 03/19/2012 0740   BUN 4* 03/19/2012 0740   CREATININE 0.51 03/19/2012 0740   CALCIUM 8.9 03/19/2012 0740   GFRNONAA >90 03/19/2012 0740   GFRAA >90 03/19/2012 0740     Imaging:  Scheduled Meds:   . aspirin  81 mg Per Tube Daily  . docusate sodium  100 mg Oral BID  . feeding supplement  237 mL Oral TID BM  . folic acid  1 mg Oral Daily  . levETIRAcetam  500 mg Oral Q12H  . levothyroxine  50 mcg Oral QAC breakfast  . mesalamine  2.4 g Oral BID  . pantoprazole  40 mg Oral Q1200  . potassium chloride  10 mEq Intravenous Q1 Hr x 4  . simvastatin  20 mg Oral q1800  . sodium chloride  3 mL Intravenous Q12H  . DISCONTD: Ensure Plus  237 mL Oral TID BM   Continuous Infusions:   . sodium chloride 1,000 mL (03/18/12 2000)   PRN Meds:.acetaminophen, acetaminophen, albuterol, alum & mag hydroxide-simeth, benzocaine, guaiFENesin-dextromethorphan, HYDROcodone-acetaminophen, ondansetron (ZOFRAN) IV, ondansetron, DISCONTD: lidocaine   Physical Exam: Filed Vitals:   03/19/12 0917  BP:   Pulse: 101  Temp:   Resp: 20   Awake and alert Cor- RRR Neuro - right foot with cramping, involuntary movement right leg, unable to bear weight and has difficulty moving her leg. She also reports objects appear to be shaking.     Assessment/Plan: 1. Neuro - patient presented with seizure tha occurred when she had run out of Keppra but her right leg symptoms are suggestive of CNS event. Plan MRI  brain  Neuro consult.  2. Hyponatremia/hypokalemia - Am labs with improvement.  3. Oncology - s/p second excisional surgery with complications but she has been stable. She is followed at [facility] [place] [facility] ([phone]; (c) [phone] Call-grp - [facility] Tele: [phone]  03/19/2012, 12:33 PM

## 2012-03-20 LAB — BASIC METABOLIC PANEL
BUN: 6 mg/dL (ref 6–23)
CO2: 26 mEq/L (ref 19–32)
Chloride: 95 mEq/L — ABNORMAL LOW (ref 96–112)
Creatinine, Ser: 0.58 mg/dL (ref 0.50–1.10)
Glucose, Bld: 103 mg/dL — ABNORMAL HIGH (ref 70–99)
Potassium: 3.9 mEq/L (ref 3.5–5.1)

## 2012-03-20 MED ORDER — GUAIFENESIN-DM 100-10 MG/5ML PO SYRP: 5.0000 mL | ORAL_SOLUTION | ORAL | Status: DC | PRN

## 2012-03-20 NOTE — Evaluation (Signed)
Physical Therapy Evaluation Patient Details Name: [name] MRN: [id number] DOB: [birth year] Today's Date: 03/20/2012 Time: 7829-5621 PT Time Calculation (min): 46 min  PT Assessment / Plan / Recommendation Clinical Impression  Pt presented to [facility] having had grand mal seizure at home with husband present.  Pt with very complex medical history including subdural hematoma, ataxia, CVA, dementia, tracheostomy, and ETOH abuse.  Tolerated OOB and ambulation in hallway on 6L O2 via trach collar (28%), however pt very impulsive with all mobility and very ataxic with RLE weakness/decreased sensation.  Noted pt also very agitated that PT/OT unable to understand many things she "mouthed" due to trach, causing pt to have to do written communication throughout session.  Pt will benefit from skilled PT in acute venue to address deficits.  PT recommends HHPT with 24/7 supervision/assist at home for safety at D/C.     PT Assessment  Patient needs continued PT services    Follow Up Recommendations  Home health PT    Barriers to Discharge None      Equipment Recommendations  None recommended by PT    Recommendations for Other Services     Frequency Min 3X/week    Precautions / Restrictions Precautions Precautions: Fall Restrictions Weight Bearing Restrictions: No   Pertinent Vitals/Pain No pain      Mobility  Bed Mobility Bed Mobility: Supine to Sit Supine to Sit: 5: Supervision;HOB elevated Details for Bed Mobility Assistance: Supervision and cues for safety due to pt impulsive with all mobility.  Transfers Transfers: Sit to Stand;Stand to Sit Sit to Stand: 4: Min assist;With upper extremity assist;From bed;From chair/3-in-1 Stand to Sit: 4: Min assist;With upper extremity assist;With armrests;To chair/3-in-1 Details for Transfer Assistance: Min assist for safety due to pt very unsteady and impulsive with all mobility.  Max cues for safety and hand placement when sitting/standing.    Ambulation/Gait Ambulation/Gait Assistance: 4: Min assist;3: Mod assist (+2 for equipment) Ambulation Distance (Feet): 400 Feet Assistive device: Rolling walker Ambulation/Gait Assistance Details: Assist for maintaining upright posture and for steering/positioning RW throughout ambulation.  Cues for safety, sequencing, and technique with RW.  Pt relies on compensatory strategies (using vision) in order to advance RLE.  Noted several occasions where pt would drag leg, step RLE too far into RW or would try to step RLE outside of RW to the side.  Gait Pattern: Decreased hip/knee flexion - right;Decreased dorsiflexion - right;Decreased weight shift to right;Right steppage;Ataxic;Trunk flexed Gait velocity: intermittently increased with max cues for safety  Stairs: No Wheelchair Mobility Wheelchair Mobility: No    Exercises     PT Diagnosis: Abnormality of gait;Generalized weakness;Hemiplegia dominant side  PT Problem List: Decreased strength;Decreased activity tolerance;Decreased balance;Decreased mobility;Decreased cognition;Decreased knowledge of use of DME;Decreased safety awareness;Impaired sensation PT Treatment Interventions: DME instruction;Gait training;Functional mobility training;Therapeutic activities;Therapeutic exercise;Balance training;Patient/family education   PT Goals Acute Rehab PT Goals PT Goal Formulation: With patient Time For Goal Achievement: 03/27/12 Potential to Achieve Goals: Good Pt will go Sit to Stand: with modified independence PT Goal: Sit to Stand - Progress: Goal set today Pt will go Stand to Sit: with modified independence PT Goal: Stand to Sit - Progress: Goal set today Pt will Ambulate: >150 feet;with supervision;with least restrictive assistive device PT Goal: Ambulate - Progress: Goal set today Pt will Perform Home Exercise Program: with supervision, verbal cues required/provided PT Goal: Perform Home Exercise Program - Progress: Goal set  today  Visit Information  Last PT Received On: 03/20/12 Assistance Needed: +2 PT/OT Co-Evaluation/Treatment:  Yes    Subjective Data  Subjective: People make me feel that I am useless (pt with tracheostomy, therefore wrote on board) Patient Stated Goal: n/a   Prior Functioning  Home Living Lives With: Significant other Available Help at Discharge: Family;Available PRN/intermittently Type of Home: House Home Layout: One level Home Adaptive Equipment: Bedside commode/3-in-1;Straight cane;Walker - rolling Prior Function Driving: No Vocation: Retired Musician: Tracheostomy (VERY difficult to understand)    Cognition  Overall Cognitive Status: Impaired Area of Impairment: Safety/judgement;Awareness of errors;Following commands;Problem solving Difficult to assess due to: Tracheostomy Arousal/Alertness: Awake/alert Orientation Level: Appears intact for tasks assessed Behavior During Session: Agitated Following Commands: Follows one step commands inconsistently;Follows multi-step commands inconsistently Safety/Judgement: Decreased awareness of safety precautions;Decreased safety judgement for tasks assessed;Impulsive;Decreased awareness of need for assistance Safety/Judgement - Other Comments: Pt very impulsive to stand and move around in room without RW and without ensuring that chair was locked.  Pt also with lines and oxygen tubing.  Awareness of Errors: Assistance required to identify errors made;Assistance required to correct errors made Cognition - Other Comments: Pt very agitated that PT/OT were unable to understand most things she said, causing her to have to write to communicate.     Extremity/Trunk Assessment Right Lower Extremity Assessment RLE ROM/Strength/Tone: Deficits RLE ROM/Strength/Tone Deficits: Unable to fully assess due to impulsiveness and pts agitation, however pt with history of CVA with noted weakness of RLE with pt dragging leg behind while  ambulating.  States that she has no feeling in leg either.  RLE Sensation: Deficits RLE Sensation Deficits: States she cannot feel RLE RLE Coordination: Deficits RLE Coordination Deficits: Tendency to drag RLE and allow it to get outside of RW when ambulating.  Left Lower Extremity Assessment LLE ROM/Strength/Tone: [facility] for tasks assessed Trunk Assessment Trunk Assessment: Kyphotic   Balance    End of Session PT - End of Session Equipment Utilized During Treatment: Gait belt;Oxygen Activity Tolerance: Patient limited by fatigue Patient left: in chair;with call bell/phone within reach;with chair alarm set Nurse Communication: Mobility status  GP     Page, Meribeth Mattes 03/20/2012, 10:09 AM

## 2012-03-20 NOTE — Progress Notes (Signed)
Subjective: Dr. 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 patinet doing beter this am Objective: Lab: Lab Results  Component Value Date   WBC 12.3* 04/11/12   HGB 13.2 2012/04/11   HCT 37.2 11-Apr-2012   MCV 88.6 04-11-12   PLT 184 04/11/12   BMET    Component Value Date/Time   NA 131* 03/20/2012 0415   K 3.9 03/20/2012 0415   CL 95* 03/20/2012 0415   CO2 26 03/20/2012 0415   GLUCOSE 103* 03/20/2012 0415   BUN 6 03/20/2012 0415   CREATININE 0.58 03/20/2012 0415   CALCIUM 9.1 03/20/2012 0415   GFRNONAA 89* 03/20/2012 0415   GFRAA >90 03/20/2012 0415     Imaging: MRI brain 11-Apr-2012: IMPRESSION:  1. Pronounced chronic encephalomalacia in the left posterior  hemisphere. There is mild diffusion and FLAIR signal abnormality  along the medial aspect of the atrophied cortex, and this may  reflect recent seizure activity in this setting. Ischemia is less  likely. The right lower extremity motor and sensory region is  among the areas affected.  2. No other acute intracranial abnormality.   Scheduled Meds:   . aspirin  81 mg Per Tube Daily  . docusate sodium  100 mg Oral BID  . feeding supplement  237 mL Oral TID BM  . folic acid  1 mg Oral Daily  . levETIRAcetam  500 mg Oral Q12H  . levothyroxine  50 mcg Oral QAC breakfast  . mesalamine  2.4 g Oral BID  . pantoprazole  40 mg Oral Q1200  . potassium chloride  40 mEq Oral BID  . simvastatin  20 mg Oral q1800  . sodium chloride  3 mL Intravenous Q12H   Continuous Infusions:   . sodium chloride 1,000 mL (03/20/12 0332)   PRN Meds:.acetaminophen, acetaminophen, albuterol, alum & mag hydroxide-simeth, benzocaine, guaiFENesin-dextromethorphan, HYDROcodone-acetaminophen, ondansetron (ZOFRAN) IV, ondansetron   Physical Exam: Filed Vitals:   03/20/12 0606  BP: 140/52  Pulse: 96  Temp: 98.4 F (36.9 C)  Resp: 18        Assessment/Plan 1.  Seizure - resolve no that she is back on keppra 2. Hyponatremia - resolved.   D/c home. Home health ordered f-to-f done  Dictated # [id number]  [facility] [place] [facility] (o) [phone]; (c) [phone] Call-grp - [facility] Tele: [phone]  03/20/2012, 6:55 AM

## 2012-03-20 NOTE — Progress Notes (Signed)
Patient requesting [facility] for [facility] Care needs; Venia Minks RN with [facility] called for arrangements; B Ave Filter RN,BSN,MHA

## 2012-03-20 NOTE — Evaluation (Signed)
Occupational Therapy Evaluation Patient Details Name: [name] MRN: [id number] DOB: 1941-02-03 Today's Date: 03/20/2012 Time: 4098-1191 OT Time Calculation (min): 46 min  OT Assessment / Plan / Recommendation Clinical Impression  This 71 year old female was admitted with seizures.  She has a h/o SDH, ataxia, stroke, dementia, and seizures.  Pt is very impulsive and will needs close 24/7 supervison/assist at home vs. stsnf.  She will benefit from skilled OT to increase independence and safety with adls.     OT Assessment  Patient needs continued OT Services    Follow Up Recommendations  Supervision/Assistance - 24 hour;Home health OT (vs. stsnf)    Barriers to Discharge      Equipment Recommendations  None recommended by PT    Recommendations for Other Services    Frequency  Min 2X/week    Precautions / Restrictions Precautions Precautions: Fall Restrictions Weight Bearing Restrictions: No   Pertinent Vitals/Pain none    ADL  Eating/Feeding: Simulated;Set up Where Assessed - Eating/Feeding: Chair Grooming: Performed;Wash/dry face;Wash/dry hands;Set up Where Assessed - Grooming: Supported sitting Upper Body Bathing: Simulated;Set up Where Assessed - Upper Body Bathing: Unsupported sitting Toilet Transfer: Simulated;Minimal assistance (bed, walk, chair) Toilet Transfer Method: Sit to stand Transfers/Ambulation Related to ADLs: ambulated with PT:  +2 for safety with multiple lines/trach/02.  Pt impulsive--moves quickly and drags R LE at times--decreased sensation ADL Comments: Pt vascillated between being very angry and mouthing words with much animation and smiling.  She is frustrated in trying to communicate and feels I didn't approach her correctly,  She also expressed frustration when meal arrived.  She wrote most  of what she was saying.  started ADL but did not finish as she wanted to walk then get to chair.  She expressed the order that she wanted to do things.   Uncertain about PLOF.      OT Diagnosis: Generalized weakness  OT Problem List: Decreased strength;Decreased activity tolerance;Impaired balance (sitting and/or standing);Decreased cognition;Decreased safety awareness;Decreased knowledge of use of DME or AE;Cardiopulmonary status limiting activity;Impaired UE functional use OT Treatment Interventions: Self-care/ADL training;Cognitive remediation/compensation;Therapeutic activities;DME and/or AE instruction;Balance training;Patient/family education   OT Goals Acute Rehab OT Goals OT Goal Formulation: With patient Time For Goal Achievement: 04/03/12 Potential to Achieve Goals: Good ADL Goals Pt Will Perform Grooming: with min assist;Standing at sink (min guard) ADL Goal: Grooming - Progress: Goal set today Pt Will Transfer to Toilet: with min assist;Ambulation;3-in-1 (min guard) ADL Goal: Toilet Transfer - Progress: Goal set today Pt Will Perform Toileting - Hygiene: with min assist;Sit to stand from 3-in-1/toilet ADL Goal: Toileting - Hygiene - Progress: Goal set today  Visit Information  Last OT Received On: 03/20/12 Assistance Needed: +2 PT/OT Co-Evaluation/Treatment: Yes    Subjective Data  Subjective: Mouths and write words:  trach Patient Stated Goal: home today   Prior Functioning  Vision/Perception  Home Living Lives With: Significant other Available Help at Discharge: Family;Available PRN/intermittently Type of Home: House Home Layout: One level Bathroom Toilet: Standard Home Adaptive Equipment: Bedside commode/3-in-1;Straight cane;Walker - rolling Prior Function Driving: No Vocation: Retired Musician: Tracheostomy (VERY difficult to understand) Dominant Hand: Left      Cognition  Overall Cognitive Status: Impaired Area of Impairment: Safety/judgement;Awareness of errors;Following commands;Problem solving Difficult to assess due to: Tracheostomy Arousal/Alertness: Awake/alert Orientation  Level: Appears intact for tasks assessed Behavior During Session: Agitated Following Commands: Follows one step commands inconsistently;Follows multi-step commands inconsistently Safety/Judgement: Decreased awareness of safety precautions;Decreased safety judgement for tasks assessed;Impulsive;Decreased awareness  of need for assistance Safety/Judgement - Other Comments: Pt very impulsive to stand and move around in room without RW and without ensuring that chair was locked.  Pt also with lines and oxygen tubing.  Awareness of Errors: Assistance required to identify errors made;Assistance required to correct errors made Cognition - Other Comments: Pt very agitated that PT/OT were unable to understand most things she said, causing her to have to write to communicate.     Extremity/Trunk Assessment Right Upper Extremity Assessment RUE ROM/Strength/Tone: Deficits RUE ROM/Strength/Tone Deficits: pt used arm as assist only   Tremor present.  Did not fully assess due to agitation Left Upper Extremity Assessment LUE ROM/Strength/Tone: WFL for tasks assessed Right Lower Extremity Assessment RLE ROM/Strength/Tone: Deficits RLE ROM/Strength/Tone Deficits: Unable to fully assess due to impulsiveness and pts agitation, however pt with history of CVA with noted weakness of RLE with pt dragging leg behind while ambulating.  States that she has no feeling in leg either.  RLE Sensation: Deficits RLE Sensation Deficits: States she cannot feel RLE RLE Coordination: Deficits RLE Coordination Deficits: Tendency to drag RLE and allow it to get outside of RW when ambulating.  Left Lower Extremity Assessment LLE ROM/Strength/Tone: [facility] for tasks assessed Trunk Assessment Trunk Assessment: Kyphotic   Mobility  Shoulder Instructions  Bed Mobility Bed Mobility: Supine to Sit Supine to Sit: 5: Supervision;HOB elevated Details for Bed Mobility Assistance: Supervision and cues for safety due to pt impulsive with all  mobility.  Transfers Sit to Stand: 4: Min assist;With upper extremity assist;From bed;From chair/3-in-1 Stand to Sit: 4: Min assist;With upper extremity assist;With armrests;To chair/3-in-1 Details for Transfer Assistance: Min assist for safety due to pt very unsteady and impulsive with all mobility.  Max cues for safety and hand placement when sitting/standing.        Exercise     Balance     End of Session OT - End of Session Activity Tolerance: Patient limited by fatigue Patient left: in chair;with call bell/phone within reach  GO     [facility] 03/20/2012, 10:28 AM Marica Otter, OTR/L [phone] 03/20/2012

## 2012-03-21 ENCOUNTER — Telehealth: Payer: Self-pay | Admitting: Internal Medicine

## 2012-03-21 ENCOUNTER — Ambulatory Visit: Payer: Medicare Other | Admitting: Physical Therapy

## 2012-03-21 ENCOUNTER — Other Ambulatory Visit: Payer: Self-pay | Admitting: Internal Medicine

## 2012-03-21 DIAGNOSIS — R197 Diarrhea, unspecified: Secondary | ICD-10-CM

## 2012-03-21 DIAGNOSIS — K5190 Ulcerative colitis, unspecified, without complications: Secondary | ICD-10-CM

## 2012-03-21 NOTE — Telephone Encounter (Signed)
Tammy, RN Home Health, Pt: [name], DOB 07/01/1941.  Pt has painful muscle spams in Right Leg every 5 to 10 min with trouble using Right Hand and Arm, unable to find object or grasp it.  Diagnosed with Seizures in hospital, discharged on 9-17, home health received referral for home visit on 9-18.  Please follow up with RN at [phone] or [phone] .

## 2012-03-21 NOTE — Discharge Summary (Signed)
[name], SCIPIO NO.:  [id number]  MEDICAL RECORD NO.:  [id number]  LOCATION:  1429                         FACILITY:  [facility]  PHYSICIAN:  Rosalyn Gess. Norins, MD  DATE OF BIRTH:  1940/09/22  DATE OF ADMISSION:  03/18/2012 DATE OF DISCHARGE:  03/20/2012                              DISCHARGE SUMMARY   ADMISSION DIAGNOSES: 1. Seizure. 2. Hypotensive episode. 3. Hyponatremia. 4. History of larynx cancer. 5. Leukocytosis.  DISCHARGE DIAGNOSES: 1. Seizure. 2. Hypotensive episode. 3. Hyponatremia. 4. History of larynx cancer. 5. Leukocytosis.  CONSULTANTS:  Dr. Thad Ranger, hospital neurologist.  PROCEDURES: 1. CT of the head on March 18, 2012, at admission, which showed     chronic changes of left craniectomy, encephalomalacia and     ventricular dilatation.  No definite evidence of acute intracranial     abnormality. 2. Two-view chest x-ray, which showed mild peritracheal thickening     without focal consolidation. 3. Two views right foot, which showed nondisplaced fracture at the     distal phalanx of the great toe of indeterminate age. 4. MRI of the brain on 03-26-2012, read out as pronounced     chronic encephalomalacia in the left posterior hemisphere.  Mild     diffusion and FLAIR signal abnormality along the medial aspect of     the atrophied cortex and this may reflect recent seizure activity     in this setting.  Ischemia is less likely.  Right lower extremity     motor and sensory region is among the areas are affected.  No other     acute intracranial abnormality noted.  HISTORY OF PRESENT ILLNESS:  [name] is a 71 year old woman with multiple medical problems including peripheral vascular disease, history of subdural hematoma requiring evacuation, mild dysplasia of the cervix, cerebral vascular disease, hypothyroid disease, adenocarcinoma of the breast.  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  The patient has been followed by Dr. Terrace Arabia at [facility] and her medications had recently changed to Keppra.  She was doing well on this medication, but unfortunately ran out of medication and did not have any dosing for 3 days.  On the morning of admission, she had a stroke.  She was found by her husband and appeared to be postictal.  She was brought to the emergency department.  She was subsequently admitted for observation care.  See the H and P for past medical history, family history, and social history, as well as epic notes.  HOSPITAL COURSE:  The patient was admitted to telemetry bed, which was quickly discontinued.  She was restarted on Keppra.  The patient had no further seizure activity while in the hospital.  On the 16th, the patient complained of significant problems with movement of her right leg, cramping of her right foot and inability to bear weight.  She was subsequently evaluated with an MRI, which revealed no acute infarct.  She was seen by Dr. Thad Ranger for Neurology who had documented fairly unremarkable examination.  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  Evidently, the patient with encephalomalacia and prior brain injury, can  have a more delayed recovery from seizure activity.  There was seizure activity on her MRI scan in the motor neuron region.  On the morning of discharge, the patient was able to move her leg and foot.  She had less cramping.  The patient's other medical problems did remain stable during this admission.  With the patient being back on Keppra with no further seizure activity with rule out for CVA completed, at this time, she is ready for discharge to home.  The patient is cared for by her husband and has been doing well with home health PT and OT, which will resume.  They were seeing [facility].  DISCHARGE EXAMINATION:  VITAL SIGNS:   Temperature was 98.4, blood pressure 140/52, pulse was 96, respirations 18, O2 sat using a trach collar was 96%. GENERAL APPEARANCE:  This is a hiker, chronically ill-appearing Caucasian woman, in no acute distress. HEENT:  Conjunctivae and sclerae were clear.  Extraocular muscles were intact. NECK:  The patient has a midline tracheostomy, which appears stable with no signs of erythema or drainage. PULMONARY:  The patient has no increased work of breathing.  She is moving air well. CARDIOVASCULAR:  2+ radial pulse.  She had a quiet precordium with a regular rate and rhythm. ABDOMEN:  Scaphoid and soft.  Bowel sounds were positive. NEUROLOGIC:  The patient is awake.  She is alert.  She is able to mouth words.  She seems oriented and very happy to be going home.  Cranial nerves II through XII were grossly intact with normal facial symmetry and muscle movement.  Extraocular muscles were intact.  The patient is moving all extremities to command.  FINAL LABORATORY DATA:  Sodium was 131, potassium 3.9, chloride 95, CO2 of 26, BUN 6, creatinine 0.58, calcium was 9.1.  CBC on the 16th, white count 12,300, hemoglobin of 13.2 g, platelet count 184,000.  DISPOSITION:  The patient is to be discharged home.  CONDITION AT TIME OF DISCHARGE DICTATION:  Stable, but guarded given her multiple medical problems.     Rosalyn Gess Norins, MD    MEN/MEDQ  D:  03/20/2012  T:  03/21/2012  Job:  [id number]  cc:   Levert Feinstein, MD

## 2012-03-22 ENCOUNTER — Telehealth: Payer: Self-pay | Admitting: Internal Medicine

## 2012-03-22 MED ORDER — CYCLOBENZAPRINE HCL 5 MG PO TABS: 5.0000 mg | ORAL_TABLET | ORAL | Status: DC | PRN

## 2012-03-22 NOTE — Telephone Encounter (Signed)
Mr Dubiel reports the diarrhea was better last pm and he thought the hospital did stool studies during the recent admission; I couldn't find any results or pending studies. I put the orders in and  He can pick up the containers should the diarrhea worsen. 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 Genevieve Norlander [facility] came yesterday and he is hoping for more visits by PT, OT, nursing and aides. The pt had multiple per spouse as we talked and he tried to calm her down. Advised him to call the neurologist and see if ativan is indicated and can be ordered. He will call back for worsening diarrhea and he reports pt remains on mesalamine and her stools are liquid but not blood is seen.

## 2012-03-22 NOTE — Telephone Encounter (Signed)
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 For muscle spasm call in flexeril 5 mg 1 q4 hrs as needed.

## 2012-03-22 NOTE — Telephone Encounter (Signed)
Caller: Douglas/Spouse; PCP: Illene Regulus (Adults only); Best Callback Phone Number: [phone], caller reports discharge 03/20/12 from hospital with seizures. new onset of numbness on right side, like she had in the hospital. 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 For muscle spasm call in flexeril 5 mg 1 q4 hrs as needed." Advised to call home health nurse: Genevieve Norlander, states their physical therapist is in the home now. States he will call them now. Health information only.

## 2012-03-22 NOTE — Telephone Encounter (Signed)
Home health nurse Tammy notified of Dr. Debby Bud response of muscle spasms of patient. Medication sent in to pharmacy. Marland Kitchen

## 2012-03-24 LAB — CULTURE, BLOOD (ROUTINE X 2): Culture: NO GROWTH

## 2012-03-25 ENCOUNTER — Encounter (HOSPITAL_COMMUNITY): Payer: Self-pay | Admitting: *Deleted

## 2012-03-25 ENCOUNTER — Observation Stay (HOSPITAL_COMMUNITY)
Admission: EM | Admit: 2012-03-25 | Discharge: 2012-03-29 | Disposition: A | Discharge status: Home / Self Care | Payer: Medicare Other | Attending: Internal Medicine | Admitting: Internal Medicine

## 2012-03-25 DIAGNOSIS — E871 Hypo-osmolality and hyponatremia: Secondary | ICD-10-CM | POA: Insufficient documentation

## 2012-03-25 DIAGNOSIS — Z79899 Other long term (current) drug therapy: Secondary | ICD-10-CM | POA: Insufficient documentation

## 2012-03-25 DIAGNOSIS — Z930 Tracheostomy status: Secondary | ICD-10-CM | POA: Insufficient documentation

## 2012-03-25 DIAGNOSIS — D509 Iron deficiency anemia, unspecified: Secondary | ICD-10-CM

## 2012-03-25 DIAGNOSIS — G8190 Hemiplegia, unspecified affecting unspecified side: Secondary | ICD-10-CM

## 2012-03-25 DIAGNOSIS — I2510 Atherosclerotic heart disease of native coronary artery without angina pectoris: Secondary | ICD-10-CM | POA: Insufficient documentation

## 2012-03-25 DIAGNOSIS — C329 Malignant neoplasm of larynx, unspecified: Secondary | ICD-10-CM

## 2012-03-25 DIAGNOSIS — Z85819 Personal history of malignant neoplasm of unspecified site of lip, oral cavity, and pharynx: Secondary | ICD-10-CM | POA: Insufficient documentation

## 2012-03-25 DIAGNOSIS — K5190 Ulcerative colitis, unspecified, without complications: Secondary | ICD-10-CM | POA: Diagnosis present

## 2012-03-25 DIAGNOSIS — K5180 Other ulcerative colitis without complications: Secondary | ICD-10-CM | POA: Insufficient documentation

## 2012-03-25 DIAGNOSIS — Z8679 Personal history of other diseases of the circulatory system: Secondary | ICD-10-CM

## 2012-03-25 DIAGNOSIS — R569 Unspecified convulsions: Principal | ICD-10-CM | POA: Insufficient documentation

## 2012-03-25 DIAGNOSIS — I10 Essential (primary) hypertension: Secondary | ICD-10-CM

## 2012-03-25 DIAGNOSIS — E785 Hyperlipidemia, unspecified: Secondary | ICD-10-CM | POA: Insufficient documentation

## 2012-03-25 DIAGNOSIS — R279 Unspecified lack of coordination: Secondary | ICD-10-CM | POA: Insufficient documentation

## 2012-03-25 DIAGNOSIS — Z8673 Personal history of transient ischemic attack (TIA), and cerebral infarction without residual deficits: Secondary | ICD-10-CM | POA: Insufficient documentation

## 2012-03-25 LAB — COMPREHENSIVE METABOLIC PANEL
AST: 16 U/L (ref 0–37)
Albumin: 3.3 g/dL — ABNORMAL LOW (ref 3.5–5.2)
BUN: 11 mg/dL (ref 6–23)
Calcium: 9.3 mg/dL (ref 8.4–10.5)
Chloride: 88 mEq/L — ABNORMAL LOW (ref 96–112)
Creatinine, Ser: 0.56 mg/dL (ref 0.50–1.10)
GFR calc non Af Amer: >90 mL/min (ref >90)
Total Bilirubin: 0.4 mg/dL (ref 0.3–1.2)

## 2012-03-25 LAB — CBC WITH DIFFERENTIAL/PLATELET
Basophils Absolute: 0 10*3/uL (ref 0.0–0.1)
Basophils Relative: 0 % (ref 0–1)
Eosinophils Relative: 0 % (ref 0–5)
HCT: 36.5 % (ref 36.0–46.0)
Hemoglobin: 13.1 g/dL (ref 12.0–15.0)
MCH: 31.9 pg (ref 26.0–34.0)
MCHC: 35.9 g/dL (ref 30.0–36.0)
MCV: 88.8 fL (ref 78.0–100.0)
Monocytes Absolute: 0.9 10*3/uL (ref 0.1–1.0)
Monocytes Relative: 8 % (ref 3–12)
RDW: 14.5 % (ref 11.5–15.5)

## 2012-03-25 LAB — URINALYSIS, ROUTINE W REFLEX MICROSCOPIC
Bilirubin Urine: NEGATIVE
Glucose, UA: NEGATIVE mg/dL
Hgb urine dipstick: NEGATIVE
Ketones, ur: NEGATIVE mg/dL
Protein, ur: 100 mg/dL — AB
pH: 6.5 (ref 5.0–8.0)

## 2012-03-25 MED ORDER — MESALAMINE 1.2 G PO TBEC
2.4000 g | DELAYED_RELEASE_TABLET | Freq: Two times a day (BID) | ORAL | Status: DC
  Administered 2012-03-26 – 2012-03-29 (×8): 2.4 g via ORAL
  Filled 2012-03-25 (×9): qty 2

## 2012-03-25 MED ORDER — LEVETIRACETAM 500 MG PO TABS
1000.0000 mg | ORAL_TABLET | Freq: Once | ORAL | Status: DC
  Filled 2012-03-25: qty 2

## 2012-03-25 MED ORDER — FOLIC ACID 1 MG PO TABS
1.0000 mg | ORAL_TABLET | Freq: Every day | ORAL | Status: DC
Start: 2012-03-26 — End: 2012-03-29
  Administered 2012-03-26 – 2012-03-29 (×4): 1 mg via ORAL
  Filled 2012-03-25 (×4): qty 1

## 2012-03-25 MED ORDER — VITAMIN C 500 MG PO TABS
500.0000 mg | ORAL_TABLET | Freq: Every day | ORAL | Status: DC
Start: 2012-03-26 — End: 2012-03-29
  Administered 2012-03-26 – 2012-03-29 (×4): 500 mg via ORAL
  Filled 2012-03-25 (×4): qty 1

## 2012-03-25 MED ORDER — SODIUM CHLORIDE 0.9 % IJ SOLN: 3.0000 mL | INTRAMUSCULAR | Status: DC | PRN

## 2012-03-25 MED ORDER — ENSURE PLUS PO LIQD
237.0000 mL | Freq: Three times a day (TID) | ORAL | Status: DC
  Administered 2012-03-26 (×2): 237 mL via ORAL
  Filled 2012-03-25 (×4): qty 237

## 2012-03-25 MED ORDER — CIPROFLOXACIN IN D5W 400 MG/200ML IV SOLN
400.0000 mg | Freq: Once | INTRAVENOUS | Status: DC
  Filled 2012-03-25: qty 200

## 2012-03-25 MED ORDER — CALCIUM CARBONATE 1250 (500 CA) MG PO TABS
1250.0000 mg | ORAL_TABLET | Freq: Every day | ORAL | Status: DC
  Administered 2012-03-26 – 2012-03-27 (×2): 1000 mg via ORAL
  Administered 2012-03-28 – 2012-03-29 (×2): 1250 mg via ORAL
  Filled 2012-03-25 (×7): qty 1

## 2012-03-25 MED ORDER — LEVOTHYROXINE SODIUM 50 MCG PO TABS
50.0000 ug | ORAL_TABLET | Freq: Every day | ORAL | Status: DC
  Administered 2012-03-26 – 2012-03-29 (×4): 50 ug via ORAL
  Filled 2012-03-25 (×4): qty 1

## 2012-03-25 MED ORDER — LORAZEPAM 2 MG/ML IJ SOLN
INTRAMUSCULAR | Status: AC
  Filled 2012-03-25: qty 1

## 2012-03-25 MED ORDER — SODIUM CHLORIDE 0.9 % IJ SOLN
3.0000 mL | Freq: Two times a day (BID) | INTRAMUSCULAR | Status: DC
  Administered 2012-03-26 – 2012-03-28 (×5): 3 mL via INTRAVENOUS

## 2012-03-25 MED ORDER — ADULT MULTIVITAMIN W/MINERALS CH
1.0000 | ORAL_TABLET | Freq: Every day | ORAL | Status: DC
  Administered 2012-03-26 – 2012-03-29 (×4): 1 via ORAL
  Filled 2012-03-25 (×5): qty 1

## 2012-03-25 MED ORDER — SIMVASTATIN 20 MG PO TABS
20.0000 mg | ORAL_TABLET | Freq: Every day | ORAL | Status: DC
  Filled 2012-03-25: qty 1

## 2012-03-25 MED ORDER — SODIUM CHLORIDE 0.9 % IV SOLN
500.0000 mg | Freq: Once | INTRAVENOUS | Status: AC
  Administered 2012-03-25: 500 mg via INTRAVENOUS
  Filled 2012-03-25: qty 5

## 2012-03-25 MED ORDER — SODIUM CHLORIDE 0.9 % IV SOLN
1000.0000 mg | Freq: Two times a day (BID) | INTRAVENOUS | Status: DC
  Administered 2012-03-25 – 2012-03-28 (×6): 1000 mg via INTRAVENOUS
  Filled 2012-03-25 (×9): qty 10

## 2012-03-25 MED ORDER — LORAZEPAM 2 MG/ML IJ SOLN
2.0000 mg | Freq: Four times a day (QID) | INTRAMUSCULAR | Status: DC | PRN
  Administered 2012-03-26: 2 mg via INTRAVENOUS
  Filled 2012-03-25: qty 1

## 2012-03-25 MED ORDER — BACLOFEN 10 MG PO TABS
10.0000 mg | ORAL_TABLET | Freq: Three times a day (TID) | ORAL | Status: DC | PRN
  Filled 2012-03-25: qty 1

## 2012-03-25 MED ORDER — LORAZEPAM 2 MG/ML IJ SOLN: 2.0000 mg | INTRAMUSCULAR | Status: DC | PRN

## 2012-03-25 MED ORDER — LORAZEPAM 2 MG/ML IJ SOLN: 1.0000 mg | INTRAMUSCULAR | Status: DC | PRN

## 2012-03-25 MED ORDER — ASPIRIN 81 MG PO CHEW
81.0000 mg | CHEWABLE_TABLET | Freq: Every day | ORAL | Status: DC
  Administered 2012-03-26 – 2012-03-29 (×4): 81 mg via ORAL
  Filled 2012-03-25 (×4): qty 1

## 2012-03-25 MED ORDER — DILTIAZEM HCL ER COATED BEADS 180 MG PO CP24
180.0000 mg | ORAL_CAPSULE | Freq: Every day | ORAL | Status: DC
Start: 2012-03-26 — End: 2012-03-29
  Administered 2012-03-26 – 2012-03-29 (×4): 180 mg via ORAL
  Filled 2012-03-25 (×4): qty 1

## 2012-03-25 MED ORDER — SODIUM CHLORIDE 0.9 % IV SOLN
250.0000 mL | INTRAVENOUS | Status: DC | PRN
  Administered 2012-03-27: 250 mL via INTRAVENOUS

## 2012-03-25 NOTE — ED Notes (Signed)
Per patient husband, patient has no sensation to right side of body and has no right peripheral vision since seizure prior to last hospital stay. Pt has no other new deficits.

## 2012-03-25 NOTE — ED Notes (Signed)
Pt witnessed having seizure like activity in which her entire right side of body moving/shaking. Pt head turned towards right side and mouth continuously opening and closing. Pt able to answer questions during episode and vitals remained stable.  Pt eyes open and alert. MD Effie Shy called to room.  Pt not post ictal and in no distress after episode.    Pt coughing and sounds congested. Resp called to suction patient.

## 2012-03-25 NOTE — ED Notes (Signed)
resp at pt bedside to suction at stoma

## 2012-03-25 NOTE — H&P (Addendum)
[facility] History and Physical  [name] ZOX:[id number] DOB: 1940-10-01 DOA: 03/25/2012  Referring physician: ED PCP: Illene Regulus, MD   Chief Complaint: Partial Seizures  HPI: [name] is a 70 y.o. 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  Dr. Terrace Arabia due to concern for toxic side effects of these medications wanted to switch the patient to keppra if possible and so tapered patient off of dilantin and phenobarb.  She had been doing well until 03/18/12 when she had multiple seizure episodes including at least 1 grand mal.  Apparently there may have been some confusion regarding refills of keppra.  Patient was admitted to the hospital on 03/18/12 (please see documentation from that time), after her seizures were controlled she was discharged home on Keppra 500mg  BID PO.  The Keppra has not been enough to control her seizures, patient and husband report numerous partial seizure episodes.  All her seizure episodes are characterized by tremor of the R side of her body, ongoing numbness and loss of proprioception on the R side of her body which has made it difficult for her to walk.  In the ED today despite loading the patient with an additional 500mg  IV keppra this afternoon she has continued to have at least 3 partial seizure episodes from which she recovers all but sensation in her RUE RLE.  Review of Systems: 12 systems reviewed and otherwise negative.  Past Medical History  Diagnosis Date  . Peripheral vascular disease   . Subdural hematoma   . Ataxia   . Mild dysplasia of cervix   . UTI (lower urinary tract infection)   . Urinary incontinence   . Seizures   . Cerebrovascular disease   . Hypothyroidism   . Personal history of colonic polyps     adenomatous 1997 & tubular adenoma and hyplastic  2008  . Hyperlipidemia    . Adenocarcinoma, breast     bilateral  . H/O alcohol abuse   . Diverticulosis of colon (without mention of hemorrhage)   . Redundant colon   . Squamous cell carcinoma of mouth   . Stroke   . C. difficile colitis   . Dementia t  . Ulcerative colitis    Past Surgical History  Procedure Date  . Subdural hematoma evacuation via craniotomy   . 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 Multiple tooth extractions     due to oral cancer  . Cataract extraction, bilateral   . Breast lumpectomy     left breast with radiation therapy  . Carotid endarterectomy     left  . Mastectomy     Right, history with nodule dissection  . Orif hip fracture Sept '12    Right hip: screw and plate repair.  . [facility] 08/2010    [facility]   Social History:  reports that she quit smoking about 7 years ago. She has never used smokeless tobacco. She reports that she does not drink alcohol or use illicit drugs. Patient lives at home, husband performs many ADLs  No Known Allergies  Family History  Problem Relation Age of Onset  . Coronary artery disease Mother   . Heart disease Mother   . Diabetes Sister   . Breast cancer Maternal Aunt   . Breast cancer Sister     Prior  to Admission medications   Medication Sig Start Date End Date Taking? Authorizing Provider  aspirin 81 MG chewable tablet Chew 81 mg by mouth daily. 10/31/11 10/30/12 Yes Jacques Navy, MD  baclofen (LIORESAL) 10 MG tablet Take 10 mg by mouth 3 (three) times daily as needed. spasms   Yes Historical Provider, MD  calcium carbonate (OS-CAL) 600 MG TABS Take 600 mg by mouth daily.   Yes Historical Provider, MD  diltiazem (CARDIZEM CD) 180 MG 24 hr capsule Take 1 capsule (180 mg total) by mouth daily. 11/21/11  Yes Jacques Navy, MD  Ensure Plus (ENSURE PLUS) LIQD Take 237 mLs by mouth 3 (three) times daily between meals.    Yes Historical Provider, MD  folic acid (FOLVITE) 1 MG tablet Take 1 tablet (1 mg total)  by mouth daily. 11/11/11  Yes Jacques Navy, MD  levETIRAcetam (KEPPRA) 500 MG tablet Take 500 mg by mouth every 12 (twelve) hours.   Yes Historical Provider, MD  levothyroxine (SYNTHROID, LEVOTHROID) 50 MCG tablet Take 1 tablet (50 mcg total) by mouth daily. 11/21/11  Yes Jacques Navy, MD  lidocaine (XYLOCAINE) 2 % jelly Take 1 application by mouth as needed. Applies inside lower bottom of mouth 01/06/12  Yes Romero Belling, MD  mesalamine (LIALDA) 1.2 G EC tablet Take 2.4 g by mouth 2 (two) times daily. 01/26/12 01/25/13 Yes Beverley Fiedler, MD  Multiple Vitamin (MULTIVITAMIN WITH MINERALS) TABS Take 1 tablet by mouth daily.   Yes Historical Provider, MD  pravastatin (PRAVACHOL) 40 MG tablet Take 1 tablet (40 mg total) by mouth every evening. 11/21/11  Yes Jacques Navy, MD  vitamin C (ASCORBIC ACID) 500 MG tablet Take 500 mg by mouth daily.   Yes Historical Provider, MD   Physical Exam: Filed Vitals:   03/25/12 1309 03/25/12 1322 03/25/12 2022  BP:  127/61 154/73  Pulse:  89 102  Temp:  97.9 F (36.6 C) 97.9 F (36.6 C)  TempSrc:  Oral Oral  Resp:  18 22  SpO2: 100% 92% 94%     General:  NAD, resting comfortably in hospital bed  Eyes: patient has R homonomous hemianopsia  ENT: Patient has tracheostomy in place and uses speech device  Neck: Treacheostomy in place, otherwise supple w/o JVD  Cardiovascular: RRR w/o MRG  Respiratory: CTA B  Abdomen: soft, nt, nd, bs+  Skin: without rashes or lesions  Neurologic: 5/5 muscle strength in all 4 extremities, decreased sensation and proprioception on the RUE and RLE compared to the left side.  Mildly brisker reflexes in the RUE compared to the LUE but cant really quantify this.  Labs on Admission:  Basic Metabolic Panel:  Lab 03/25/12 0865 03/20/12 0415 03/19/12 0740  NA 124* 131* 129*  K 3.6 3.9 3.3*  CL 88* 95* 93*  CO2 29 26 26   GLUCOSE 119* 103* 113*  BUN 11 6 4*  CREATININE 0.56 0.58 0.51  CALCIUM 9.3 9.1 8.9  MG --  -- 1.8  PHOS -- -- 3.1   Liver Function Tests:  Lab 03/25/12 1440 03/19/12 0740  AST 16 21  ALT 15 14  ALKPHOS 115 129*  BILITOT 0.4 0.7  PROT 6.8 6.9  ALBUMIN 3.3* 3.6   No results found for this basename: LIPASE:5,AMYLASE:5 in the last 168 hours No results found for this basename: AMMONIA:5 in the last 168 hours CBC:  Lab 03/25/12 1440 03/19/12 0740  WBC 12.4* 12.3*  NEUTROABS 10.2* --  HGB 13.1 13.2  HCT 36.5 37.2  MCV 88.8 88.6  PLT 242 184   Cardiac Enzymes: No results found for this basename: CKTOTAL:5,CKMB:5,CKMBINDEX:5,TROPONINI:5 in the last 168 hours  BNP (last 3 results)  Basename 10/27/11 2315  PROBNP 364.8*   CBG: No results found for this basename: GLUCAP:5 in the last 168 hours  Radiological Exams on Admission: No results found.  EKG: Independently reviewed.  Assessment/Plan Principal Problem:  *Seizure Active Problems:  CEREBROVASCULAR DISEASE, HX OF  UC (ulcerative colitis)   1. Seizure - patient with recurrent partial seizures affecting the R side of her body (L seizure focus in brain).  Will admit, put on Keppra 1gm IV q12h, will go ahead and order EEG but the likely focus is the previous known area of encephalomalacia in brain from prior stroke so will hold off on imaging for now unless Neurology wants this.  Neuro hospitalist has seen the patient already this evening and is making recommendations now. 2. Chronic medical problems including CVD, UC, HTN, hyperlipidemia - continue home meds for these at this time, will need dose of melsalamine tonight (or else her UC will flare up according to her husband) will make sure to write a note to pharmacist to see that this gets done this evening. 3. Hyponatremia - suspect low total body sodium, will repleat with NS, check urine lytes (incase this actually represents a new diagnosis of SIADH), recheck BMP in AM.  Code Status: Full Code Family Communication: Spoke with husband who is at patients  bedside Disposition Plan: Plan to admit to Dr. Debby Bud' service.  Time spent: 70 mins  GARDNER, JARED M. [facility] Pager [phone]  If 7PM-7AM, please contact night-coverage [email or web address] Password [facility] 03/25/2012, 10:49 PM

## 2012-03-25 NOTE — ED Notes (Signed)
Pt. C/o of feeling "pulse" in right ear. Pt. keppra rate lowered to 266mL/hr and is complete now. Pt. States feeling is gone.

## 2012-03-25 NOTE — ED Notes (Signed)
Per spouse pt. Had x3 15 second muscle contraction episodes. Pt. States she can feel it "going up my arm"

## 2012-03-25 NOTE — Progress Notes (Signed)
Spoke with Neuro hospitalist, holding off on keppra PO and instead giving 1gm IV now (so 1500mg  total this afternoon).  Will order as 1gm IV q12h during hospital stay.

## 2012-03-25 NOTE — ED Notes (Signed)
Social work at pt bedside talking to patient husband.

## 2012-03-25 NOTE — ED Notes (Signed)
Pt husband reports patient is transitioning to Baclofen for spasms. Pt had three days of a loading dose and started her regular dosing today. Pt had first regular dose this AM.  Pt husband states spasms have not improved.

## 2012-03-25 NOTE — ED Notes (Signed)
Pt. Husband Doug cell number [phone]

## 2012-03-25 NOTE — ED Notes (Signed)
Resp at pt bedside to provide suction

## 2012-03-25 NOTE — ED Notes (Signed)
Per EMS.  Pt is from home with complaint of seizure activity.  Pt was diagnosed with spasms and seizure and previous history of stroke. Pt was discharged on Tuesday. Pt had been on Keppra during hospital stay and was changed to a new medication on Thursday.  Pt having continuous spasms throughout the day.  EMS witnessed 6 episodes en route and describes 4 of them as only right sided movement, mostly right arm.  Two episodes appeared more involved with turning of head and more movement to right leg and arm. Pt able to maintain airway during episodes. Pt would follow commands with milder episodes, but would not follow commands with more involved episodes. No incontinence or oral trauma.  Pt has IV to left wrist-22G.  Pt in a. Fib, but has history of the same. Pt alert and oriented and able to communicate. Pt has a voice box, but that is currently with pt husband and not present at bedside.

## 2012-03-25 NOTE — ED Notes (Signed)
Pt. Husband called RN for seizure like activity. Pt. Has tonic activity on right extremities, No facial spasms noted. Pt. Was able to communicate throughout seizure. MD Effie Shy at bedside and aware.

## 2012-03-25 NOTE — ED Provider Notes (Signed)
History     CSN: [id number]  Arrival date & time 03/25/12  1318   First MD Initiated Contact with Patient 03/25/12 1411      Chief Complaint  Patient presents with  . Seizures    (Consider location/radiation/quality/duration/timing/severity/associated sxs/prior treatment) HPI Comments: [name] is a 71 y.o. Female who had her second seizure of the week today.  The seizures have been characterized by right-sided tonic at gravity for 15-20 seconds, followed by a short postictal state. In the last week, she is also had frequent spasms of the arms, and legs. They last a few seconds. They've been treated with baclofen. She is due to have her dose of baclofen increased today. She has been eating per usual. There is no cough. She has had more mucus in her trachea requiring suctioning through her tracheostomy, than usual. She continues to be able to walk. She has been up at night, more, and this has tired out her caregiver, her husband. She is taking her medicines as prescribed. There are no known aggravating or palliative factors. She was admitted after her first seizure one week ago.  Patient is a 71 y.o. female presenting with seizures. The history is provided by the patient.  Seizures     Past Medical History  Diagnosis Date  . Peripheral vascular disease   . Subdural hematoma   . Ataxia   . Mild dysplasia of cervix   . UTI (lower urinary tract infection)   . Urinary incontinence   . Seizures   . Cerebrovascular disease   . Hypothyroidism   . Personal history of colonic polyps     adenomatous 1997 & tubular adenoma and hyplastic  2008  . Hyperlipidemia   . Adenocarcinoma, breast     bilateral  . H/O alcohol abuse   . Diverticulosis of colon (without mention of hemorrhage)   . Redundant colon   . Squamous cell carcinoma of mouth   . Stroke   . C. difficile colitis   . Dementia t  . Ulcerative colitis     Past Surgical History  Procedure Date  . Subdural hematoma  evacuation via craniotomy   . 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 Multiple tooth extractions     due to oral cancer  . Cataract extraction, bilateral   . Breast lumpectomy     left breast with radiation therapy  . Carotid endarterectomy     left  . Mastectomy     Right, history with nodule dissection  . Orif hip fracture Sept '12    Right hip: screw and plate repair.  . [facility] surgery 08/2010    [facility]    Family History  Problem Relation Age of Onset  . Coronary artery disease Mother   . Heart disease Mother   . Diabetes Sister   . Breast cancer Maternal Aunt   . Breast cancer Sister     History  Substance Use Topics  . Smoking status: Former Smoker -- 40 years    Quit date: 10/26/2004  . Smokeless tobacco: Never Used  . Alcohol Use: No     normally 1 glass wine after dinner - none x 2.5 months    OB History    Grav Para Term Preterm Abortions TAB SAB Ect Mult Living                  Review of Systems  Neurological: Positive for seizures.  All other systems reviewed and  are negative.    Allergies  Review of patient's allergies indicates no known allergies.  Home Medications   Current Outpatient Rx  Name Route Sig Dispense Refill  . ASPIRIN 81 MG PO CHEW Oral Chew 81 mg by mouth daily.    Marland Kitchen BACLOFEN 10 MG PO TABS Oral Take 10 mg by mouth 3 (three) times daily as needed. spasms    . CALCIUM CARBONATE 600 MG PO TABS Oral Take 600 mg by mouth daily.    Marland Kitchen DILTIAZEM HCL ER COATED BEADS 180 MG PO CP24 Oral Take 1 capsule (180 mg total) by mouth daily. 90 capsule 3  . ENSURE PLUS PO LIQD Oral Take 237 mLs by mouth 3 (three) times daily between meals.     Marland Kitchen FOLIC ACID 1 MG PO TABS Oral Take 1 tablet (1 mg total) by mouth daily. 90 tablet 1  . LEVETIRACETAM 500 MG PO TABS Oral Take 500 mg by mouth every 12 (twelve) hours.    Marland Kitchen LEVOTHYROXINE SODIUM 50 MCG PO TABS Oral Take 1 tablet (50 mcg total) by mouth daily. 90 tablet 3  . LIDOCAINE HCL 2 %  EX GEL Oral Take 1 application by mouth as needed. Applies inside lower bottom of mouth    . MESALAMINE 1.2 G PO TBEC Oral Take 2.4 g by mouth 2 (two) times daily.    . ADULT MULTIVITAMIN W/MINERALS CH Oral Take 1 tablet by mouth daily.    Marland Kitchen PRAVASTATIN SODIUM 40 MG PO TABS Oral Take 1 tablet (40 mg total) by mouth every evening. 90 tablet 3  . VITAMIN C 500 MG PO TABS Oral Take 500 mg by mouth daily.      BP 154/73  Pulse 102  Temp 97.9 F (36.6 C) (Oral)  Resp 22  SpO2 94%  Physical Exam  Nursing note and vitals reviewed. Constitutional: She is oriented to person, place, and time. She appears well-developed and well-nourished.  HENT:  Head: Normocephalic and atraumatic.  Eyes: Conjunctivae normal and EOM are normal. Pupils are equal, round, and reactive to light.  Neck: Normal range of motion and phonation normal. Neck supple.       Tracheostomy present. She speaks using a voice box.  Cardiovascular: Normal rate, regular rhythm and intact distal pulses.   Pulmonary/Chest: Effort normal and breath sounds normal. No respiratory distress. She exhibits no tenderness.       Bilateral prolonged expirations. No audible wheezes, scattered rhonchi are present. She frequently coughs, and brings up mucus into her trachea  Abdominal: Soft. She exhibits no distension. There is no tenderness. There is no guarding.  Musculoskeletal: Normal range of motion.  Neurological: She is alert and oriented to person, place, and time. She has normal strength. She exhibits normal muscle tone.       She has diminished light touch, sensation to face, arm, and leg on the right only.  Skin: Skin is warm and dry.  Psychiatric: She has a normal mood and affect. Her behavior is normal. Judgment and thought content normal.    ED Course  Procedures (including critical care time)  The patient had at least 2 episodes of "spasm" in the emergency department. I was able to see the second one; it consisted of a rhythmic  tonic activity, and clonus of the right leg. Activity abated shortly after I entered the room, and I was unable to assess the arm for abnormality, at that time. The patient was alert, looking at me, and following commands. There was  no postictal state. The activity is consistent with partial seizure.   I discussed the case with Dr. Roseanne Reno, neurologist, on call. He recommends IV bolus, Keppra 500 mg, then increased daily to 1000 mg twice a day.  Reevaluation: 21:08- Pt calm. No more seizures. Repeat vital signs are reassuring. Reevaluation: 21:40- I was discussing discharge. Home with the patient and her husband. When she suddenly began seizing. It was characterized by a tonic clenching of her right hand and the right arm lifting it above the shoulder behind her head. At the same time her right leg was straightened  and the foot was flexed; both tensely. The involuntary muscle activity lasted for 15-20 seconds. During this time. She appeared to be trying to talk, and could not. The activity abated abruptly that she was able to speak and move both right hand and leg voluntarily and communicate using her voice box.     Date: 03/25/2012  Rate: 81  Rhythm: atrial fibrillation  QRS Axis: normal  Intervals: normal  ST/T Wave abnormalities: nonspecific T wave changes  Conduction Disutrbances:AF  Narrative Interpretation:   Old EKG Reviewed: unchanged   Labs Reviewed  CBC WITH DIFFERENTIAL - Abnormal; Notable for the following:    WBC 12.4 (*)     Neutrophils Relative 82 (*)     Neutro Abs 10.2 (*)     Lymphocytes Relative 10 (*)     All other components within normal limits  COMPREHENSIVE METABOLIC PANEL - Abnormal; Notable for the following:    Sodium 124 (*)     Chloride 88 (*)     Glucose, Bld 119 (*)     Albumin 3.3 (*)     All other components within normal limits  URINALYSIS, ROUTINE W REFLEX MICROSCOPIC - Abnormal; Notable for the following:    APPearance CLOUDY (*)     Protein, ur  100 (*)     All other components within normal limits  URINE MICROSCOPIC-ADD ON   No results found.   1. Seizure       MDM  Patient with seizures. Both generalized and partial. Patient is a starting dose of Keppra and she has been bolused with Keppra, and the plan is to increase her daily, Keppra dose by 1000 mg. She has continued to see despite IV bolusing with Keppra    Plan: Admit- [facility]  Flint Melter, MD 03/27/12 [phone]

## 2012-03-25 NOTE — ED Notes (Signed)
Dr Effie Shy speaking with pt's spouse

## 2012-03-25 NOTE — ED Notes (Signed)
Internal MD Dr. Julian Reil told RN to hold PO levetiracetam till he consulted with neurologist.

## 2012-03-25 NOTE — ED Notes (Signed)
Pt continues to have spasms of right arm. Pt arm seems to cramp at bicep and patient reports pain to this area. Pt arm moves uncontrollably and patient hand either extends or pinches onto pt or other objects.  Pt does not lose consciousness and is able to answer questions during episode.

## 2012-03-25 NOTE — ED Notes (Signed)
Clinical Social Work Department BRIEF PSYCHOSOCIAL ASSESSMENT 03/25/2012  Patient:  [name], [name]     Account Number:  [id number]     Admit date:  03/25/2012  Clinical Social Worker:  Lana Fish  Date/Time:  03/25/2012 04:48 PM  Referred by:  Physician  Date Referred:  03/25/2012 Referred for  Other - See comment   Other Referral:   Respite Placement   Interview type:  Family Other interview type:   Face to Face with pt's husband [name]    PSYCHOSOCIAL DATA Living Status:  HUSBAND Admitted from facility:   Level of care:  Independent Living Primary support name:  [name] Primary support relationship to patient:  SPOUSE Degree of support available:   Limited support provided by husband.    CURRENT CONCERNS Current Concerns  Adjustment to Illness   Other Concerns:   MD provided CSW with historical background information of pt. MD expressed concerns to CSW of pt's husband providing care and exhibiting symptoms of caregiver exhaustion. CSW spoke with pt's spouse who provided additional information in regard to pt's medical history and his duties as caregiver. Pt's spouse reported providing pt with 24 hour supervision since her recently release on Tuesday from the hospital. MD requests respite placement for pt if plausible.    SOCIAL WORK ASSESSMENT / PLAN PT/OT has been consulted by MD to proceed with respite placement. ED CSW will complete FL2 and initiate process for respite placement. Oncoming CSW to follow up and obtain authorization tomorrow.   Assessment/plan status:  Psychosocial Support/Ongoing Assessment of Needs Other assessment/ plan:   Information/referral to community resources:    PATIENT'S/FAMILY'S RESPONSE TO PLAN OF CARE: Pt's spouse is agreeable with plan to coordinate respite.    Janann Colonel., MSW, [facility] Clinical Social Worker [phone]

## 2012-03-25 NOTE — Consult Note (Addendum)
Reason for Consult: Seizures Referring Physician: Magdalen Spatz  CC: Seizure  History is obtained from: Husband, patient  HPI: [name] is an 71 y.o. female who suffered a traumatic head injury requiring craniotomy approximately 20 years ago after a fall the bathtub. She had multiple seizures at that time, and was started on phenobarbital and Dilantin. She was then seizure free for many years and then approximately 6 weeks ago saw Dr. Terrace Arabia in clinic. Should been having falls and had broken her hip and therefore the long-term side effects of Dilantin and phenobarbital were concerning. She was therefore tapered initially off of Dilantin and subsequently also of phenobarbital. Her last dose of phenobarbital was approximately 3-4 weeks ago. She was started on Keppra about one month ago. She was doing well until Sunday, 9/15. At which point she began having seizures again. She has had approximately 20 in the past 2 days.   Her current seizures consist of right arm and leg shaking without loss of consciousness. He states that she has been numb on that side due to these seizures.  Also of note, her husband reports that she is having some mild delusions. When he leaves the house returns she chooses time of going on to have an affair. She has been diagnosed with mild dementia. She also had an ischemic stroke in 2000, with subsequent right hemianopia.   ROS: An 11 point ROS was performed and is negative except as noted in the HPI.  Past Medical History  Diagnosis Date  . Peripheral vascular disease   . Subdural hematoma   . Ataxia   . Mild dysplasia of cervix   . UTI (lower urinary tract infection)   . Urinary incontinence   . Seizures   . Cerebrovascular disease   . Hypothyroidism   . Personal history of colonic polyps     adenomatous 1997 & tubular adenoma and hyplastic  2008  . Hyperlipidemia   . Adenocarcinoma, breast     bilateral  . H/O alcohol abuse   . Diverticulosis of colon  (without mention of hemorrhage)   . Redundant colon   . Squamous cell carcinoma of mouth   . Stroke   . C. difficile colitis   . Dementia t  . Ulcerative colitis     Family History: No history of seizures  Social History: Tob: Previous smoker, quit 7 years ago 1 glass of wine per night  Exam: Current vital signs: BP 154/73  Pulse 102  Temp 97.9 F (36.6 C) (Oral)  Resp 22  SpO2 94% Vital signs in last 24 hours: Temp:  [97.9 F (36.6 C)] 97.9 F (36.6 C) (09/22 2022) Pulse Rate:  [89-102] 102  (09/22 2022) Resp:  [18-22] 22  (09/22 2022) BP: (127-154)/(61-73) 154/73 mmHg (09/22 2022) SpO2:  [92 %-100 %] 94 % (09/22 2022)  During my exam, she had a seizure consisting of right arm initial flexion followed by extension and clonic activity associated with leg clonic activity. Her right face was involved to a much lesser degree. This lasted for approximately 15-20 seconds, and immediately after the incident the patient was able to respond appropriately and give good strength in that arm.  General: In bed, no apparent distress CV: Mildly tachycardic Mental Status: Patient is awake, alert, oriented to person, place, month, year, and situation. Patient is able to give a clear and coherent history. Speech is fluent without sign of aphasia Immediate and remote memory appear intact Cranial Nerves: II: Visual Fields are notable  for a complete right hemianopia (chronic since of stroke in 2000). Pupils are equal, round, and reactive to light.  Discs are difficult to visualize. III,IV, VI: EOMI without ptosis or diploplia.  V,VII: Facial sensation and movement are symmetric.  VIII: hearing is intact to voice X: Uvula elevates symmetrically XI: Shoulder shrug is symmetric. XII: tongue is midline without atrophy or fasciculations.  Motor: Tone is normal. Bulk is normal. 5/5 strength was present in all four extremities.  Sensory: Sensation is diminished to light touch and pinprick  in her right arm, and pinprick in her right leg. Deep Tendon Reflexes: 2+ and in the biceps on left and patellae bilaterally. 3+ in right biceps Plantars: Toes are downgoing on left, upgoing on right Cerebellar: FNF intact bilaterally Gait: Unable to test due to patient having seizure.   I have reviewed labs in epic and the results pertinent to this consultation are: WBC 12.4 Sodium 124  Impression: 71 year old female with seizure disorder secondary to either her previous head injury or her subsequent CVA. She was previously controlled on phenobarbital and Dilantin, however, she has not failed Keppra given that she is only on 500 twice a day. At this time I would lobe with thousand milligrams of IV Keppra. If she does have continued seizure activity, then I would temporize her with 2 mg of Ativan and at that point would likely recommend starting a second agent.   Her new right sided numbness is likely a todd's phenomenon, but if it does not resolve once her seizures are under better control, then a repeat MRI may be needed.   Hyponatremia can lower seizure threshold especially in someone already prone to have seizures and this is very likely a contributing factor.   Recommendations: 1) Keppra IV 1000 mg x1 for total of 1500 this afternoon 2) Keppra 1000 mg every 12 hours 3) If she has continued seizure activity after Keppra load, would give 2 mg Ativan 4) If she is continuing to have seizures, then I would consider starting Vimpat in addition to her keppra.  5) Hyponatremia correction per medical hospitalists.  6) If R sided numbness does not improve with improved seizure control, would get MRI.  7 I do not feel that an EEG would significantly alter therapy at this point.    Ritta Slot, MD [facility] [phone]

## 2012-03-26 DIAGNOSIS — E871 Hypo-osmolality and hyponatremia: Secondary | ICD-10-CM

## 2012-03-26 DIAGNOSIS — R569 Unspecified convulsions: Secondary | ICD-10-CM

## 2012-03-26 LAB — BASIC METABOLIC PANEL
BUN: 10 mg/dL (ref 6–23)
Calcium: 8.5 mg/dL (ref 8.4–10.5)
GFR calc Af Amer: >90 mL/min (ref >90)
GFR calc non Af Amer: >90 mL/min (ref >90)
Potassium: 3.4 mEq/L — ABNORMAL LOW (ref 3.5–5.1)

## 2012-03-26 LAB — NA AND K (SODIUM & POTASSIUM), RAND UR: Sodium, Ur: 33 mEq/L

## 2012-03-26 MED ORDER — LACOSAMIDE 50 MG PO TABS
50.0000 mg | ORAL_TABLET | Freq: Two times a day (BID) | ORAL | Status: DC
  Administered 2012-03-26 – 2012-03-29 (×7): 50 mg via ORAL
  Filled 2012-03-26 (×7): qty 1

## 2012-03-26 MED ORDER — ENSURE COMPLETE PO LIQD
237.0000 mL | Freq: Three times a day (TID) | ORAL | Status: DC
  Administered 2012-03-27 – 2012-03-29 (×4): 237 mL via ORAL

## 2012-03-26 MED ORDER — ATORVASTATIN CALCIUM 10 MG PO TABS
10.0000 mg | ORAL_TABLET | Freq: Every day | ORAL | Status: DC
  Administered 2012-03-26 – 2012-03-28 (×3): 10 mg via ORAL
  Filled 2012-03-26 (×4): qty 1

## 2012-03-26 MED ORDER — SODIUM CHLORIDE 0.9 % IV SOLN
INTRAVENOUS | Status: DC
  Administered 2012-03-26: 01:00:00 via INTRAVENOUS
  Administered 2012-03-26: 1000 mL via INTRAVENOUS

## 2012-03-26 NOTE — Progress Notes (Signed)
[facility]ESS [facility]    SUBJECTIVE   Patient has no complaints.  She had a witnessed jerking of right arm but patient states she was awake.     OBJECTIVE   Vital signs in last 24 hours: Temp:  [97.8 F (36.6 C)-98.9 F (37.2 C)] 97.8 F (36.6 C) (09/23 0445) Pulse Rate:  [75-102] 75  (09/23 0445) Resp:  [16-22] 16  (09/23 0445) BP: (95-161)/(61-76) 95/62 mmHg (09/23 0445) SpO2:  [92 %-100 %] 95 % (09/23 0445) Weight:  [53.7 kg (118 lb 6.2 oz)] 53.7 kg (118 lb 6.2 oz) (09/23 0058)  Intake/Output from previous day: 09/22 0701 - 09/23 0700 In: 533.3 [I.V.:533.3] Out: 525 [Urine:525] Intake/Output this shift:   Nutritional status: General  Past Medical History  Diagnosis Date  . Peripheral vascular disease   . Subdural hematoma   . Ataxia   . Mild dysplasia of cervix   . UTI (lower urinary tract infection)   . Urinary incontinence   . Seizures   . Cerebrovascular disease   . Hypothyroidism   . Personal history of colonic polyps     adenomatous 1997 & tubular adenoma and hyplastic  2008  . Hyperlipidemia   . Adenocarcinoma, breast     bilateral  . H/O alcohol abuse   . Diverticulosis of colon (without mention of hemorrhage)   . Redundant colon   . Squamous cell carcinoma of mouth   . Stroke   . C. difficile colitis   . Dementia t  . Ulcerative colitis       Neurologic Exam:  Mental Status: Alert, oriented, thought content appropriate.  Cannot speak without electronic device.  No signs of aphasia. Able to follow 3 step commands without difficulty. Cranial Nerves: II: Visual fields grossly normal, pupils equal, round, reactive to light and accommodation III,IV, VI: ptosis not present, extra-ocular motions intact bilaterally V,VII: smile symmetric, facial light touch sensation normal bilaterally VIII: hearing normal bilaterally IX,X: gag reflex present XI: bilateral shoulder shrug XII: midline tongue  extension Motor: Right : Upper extremity   5/5    Left:     Upper extremity   5/5  Lower extremity   5/5     Lower extremity   5/5 Tone and bulk:normal tone throughout; no atrophy noted Sensory: Pinprick and light touch intact throughout, bilaterally Deep Tendon Reflexes: 2+ and symmetric throughout with exception of right bicep and tricep which is 3+ Plantars: Right: up   Left: downgoing Cerebellar: normal finger-to-nose,  normal heel-to-shin test  CV: pulses palpable throughout     Lab Results: Lab Results  Component Value Date/Time   CHOL 173 05/06/2011  4:43 PM   Lipid Panel No results found for this basename: CHOL,TRIG,HDL,CHOLHDL,VLDL,LDLCALC in the last 72 hours  Studies/Results: No results found.  Medications:     Scheduled:    . aspirin  81 mg Oral Daily  . calcium carbonate  1,250 mg Oral Daily  . diltiazem  180 mg Oral Daily  . Ensure Plus  237 mL Oral TID BM  . folic acid  1 mg Oral Daily  . lacosamide  50 mg Oral BID  . levetiracetam  1,000 mg Intravenous Q12H  . levetiracetam  500 mg Intravenous Once  . levothyroxine  50 mcg Oral Daily  .  LORazepam      . mesalamine  2.4 g Oral BID  . multivitamin with minerals  1 tablet Oral Daily  . simvastatin  20 mg Oral q1800  . sodium chloride  3 mL Intravenous Q12H  . vitamin C  500 mg Oral Daily  . DISCONTD: ciprofloxacin  400 mg Intravenous Once  . DISCONTD: levETIRAcetam  1,000 mg Oral Once    Assessment/Plan:   Impression: 71 year old female with seizure disorder secondary to either her previous head injury or her subsequent CVA. She was previously controlled on phenobarbital and Dilantin, however, she has not failed Keppra given that she is only on 500 twice a day. She has been on 1000 mg BID since in hospital and had one episode of right UE jerking.   Hyponatremia can lower seizure threshold especially in someone already prone to have seizures and this is very likely a contributing factor.    Recommendations:  1) Keppra 1000 mg every 12 hours  2) Will add Vimpat 50 mg BID due to patient stating she feels increased tingling in right arm which often occurs before seizure activity.  3) If she is continuing to have seizures, then I would consider starting Vimpat in addition to her keppra.    Will continue to follow    Felicie Morn PA-C [facility] [phone]  03/26/2012, 8:40 AM

## 2012-03-26 NOTE — Progress Notes (Signed)
Patient is currently active with long-term disease management services with [facility] as benefit of her [employer] insurance. Patient will receive a post discharge transition of care call and continued monthly home visits for assessments and for education. [facility] Care Coordinator who follows patient in home expresses concerns regarding patient returning home. Will attempt to discuss with husband regarding discharge plans and [facility] Care Coordinator's concerns. Relayed this message to inpatient case manager.  Arvella Merles, RN,BSN [facility] Liaison [phone]

## 2012-03-26 NOTE — Evaluation (Addendum)
Occupational Therapy Evaluation Patient Details Name: [name] MRN: [id number] DOB: [birth year] Today's Date: 03/26/2012 Time: 4098-1191 OT Time Calculation (min): 30 min  OT Assessment / Plan / Recommendation Clinical Impression  Pt is a 71 yo female who presents with ongoing seizure activity. Pt also w/h/o CVA and tracheostomy. Pt will require 24/7 A at home. Skilled OT indicated to maximize independence with BADLs to supervision level in prep for safe d/c to next venue of care.    OT Assessment  Patient needs continued OT Services    Follow Up Recommendations  Skilled nursing facility;Supervision/Assistance - 24 hour    Barriers to Discharge      Equipment Recommendations  None recommended by OT    Recommendations for Other Services    Frequency  Min 2X/week    Precautions / Restrictions Precautions Precautions: Fall   Pertinent Vitals/Pain Denied pain    ADL  Grooming: Performed;Wash/dry face Where Assessed - Grooming: Unsupported sitting Upper Body Bathing: Simulated;Set up Where Assessed - Upper Body Bathing: Unsupported sitting Lower Body Bathing: Simulated;Minimal assistance Where Assessed - Lower Body Bathing: Supported sit to stand Upper Body Dressing: Simulated;Set up Where Assessed - Upper Body Dressing: Unsupported sitting Lower Body Dressing: Performed;Set up Where Assessed - Lower Body Dressing: Supported sitting (to don/doff socks) Toilet Transfer: Performed;Minimal assistance Toilet Transfer Method: Sit to stand Toilet Transfer Equipment: Regular height toilet;Grab bars Toileting - Clothing Manipulation and Hygiene: Performed;Min guard Where Assessed - Toileting Clothing Manipulation and Hygiene: Sit to stand from 3-in-1 or toilet ADL Comments: Pt ambulated to the bathroom w minguard A and RW. Pt stated that she was not having pain in her R foot as she had earlier reported.    OT Diagnosis: Generalized weakness  OT Problem List: Decreased  activity tolerance;Decreased safety awareness;Cardiopulmonary status limiting activity;Impaired UE functional use;Decreased knowledge of use of DME or AE;Impaired balance (sitting and/or standing) OT Treatment Interventions: Therapeutic activities;Self-care/ADL training;DME and/or AE instruction;Patient/family education;Balance training   OT Goals Acute Rehab OT Goals OT Goal Formulation: With patient/family Time For Goal Achievement: 04/09/12 Potential to Achieve Goals: Good ADL Goals Pt Will Perform Grooming: with supervision;Standing at sink ADL Goal: Grooming - Progress: Goal set today Pt Will Perform Lower Body Bathing: with supervision;Sit to stand from chair;Sit to stand from bed ADL Goal: Lower Body Bathing - Progress: Goal set today Pt Will Perform Lower Body Dressing: with supervision;Sit to stand from bed;Sit to stand from chair ADL Goal: Lower Body Dressing - Progress: Goal set today Pt Will Transfer to Toilet: with supervision;Ambulation;3-in-1;Comfort height toilet ADL Goal: Toilet Transfer - Progress: Goal set today Pt Will Perform Toileting - Clothing Manipulation: with supervision;Standing;Sitting on 3-in-1 or toilet ADL Goal: Toileting - Clothing Manipulation - Progress: Goal set today Pt Will Perform Toileting - Hygiene: with supervision;Sit to stand from 3-in-1/toilet ADL Goal: Toileting - Hygiene - Progress: Goal set today  Visit Information  Last OT Received On: 03/26/12 Assistance Needed: +2 PT/OT Co-Evaluation/Treatment: Yes    Subjective Data  Subjective: Pt mouths and writes to communicate 2* trach. Patient Stated Goal: Not asked   Prior Functioning  Vision/Perception         Cognition  Overall Cognitive Status: Difficult to assess Difficult to assess due to: Tracheostomy Arousal/Alertness: Awake/alert Orientation Level: Appears intact for tasks assessed Behavior During Session: [facility] for tasks performed Following Commands: Follows one step commands  consistently    Extremity/Trunk Assessment Right Upper Extremity Assessment RUE ROM/Strength/Tone: WFL for tasks assessed RUE Sensation: Deficits RUE Sensation  Deficits: Pt was able to feel therapist touching her arm but not her shoulder or hand. RUE Coordination: WFL - gross/fine motor;WFL - gross motor Left Upper Extremity Assessment LUE ROM/Strength/Tone: WFL for tasks assessed LUE Sensation: WFL - Light Touch;WFL - Proprioception LUE Coordination: WFL - gross/fine motor Right Lower Extremity Assessment RLE ROM/Strength/Tone Deficits: hx of right sided weakness, AROM WFL RLE Sensation: Deficits RLE Sensation Deficits: sensation absent to light touch to firm pressure RLE RLE Coordination: Deficits RLE Coordination Deficits: ??ataxic movements Left Lower Extremity Assessment LLE ROM/Strength/Tone: WFL for tasks assessed LLE Sensation: WFL - Light Touch   Mobility  Shoulder Instructions  Bed Mobility Bed Mobility: Supine to Sit Supine to Sit: 4: Min assist Details for Bed Mobility Assistance: cues for safety and self assist Transfers Sit to Stand: 4: Min assist Stand to Sit: 4: Min assist Details for Transfer Assistance: Min assist for safety due to pt very unsteady and impulsive with all mobility. Max cues for safety and hand placement when sitting/standing       Exercise     Balance Static Standing Balance Static Standing - Balance Support: Bilateral upper extremity supported;During functional activity Static Standing - Level of Assistance: 4: Min assist   End of Session OT - End of Session Equipment Utilized During Treatment: Gait belt Activity Tolerance: Patient limited by fatigue Patient left: in chair;with call bell/phone within reach;with family/visitor present  GO Functional Assessment Tool Used: Clinical Judgement Functional Limitation: Self care Self Care Current Status (Z6109): At least 20 percent but less than 40 percent impaired, limited or restricted Self  Care Goal Status (U0454): At least 1 percent but less than 20 percent impaired, limited or restricted   Ingra Rother A OTR/L [phone] 03/26/2012, 4:11 PM

## 2012-03-26 NOTE — Progress Notes (Signed)
Pt witnessed having an episode of muscle spasms to R side, primarily in the R arm. Pt able to verbalize during the episode and maintain airway. No facial involvement noted. 2mg  IV Ativan given as ordered.

## 2012-03-26 NOTE — Progress Notes (Signed)
The order for simvastatin(Zocor) was changed to an equivalent dose of atorvastatin(Lipitor) due to the potential drug interaction with diltiazem.  When taken in combination with medications that inhibit its metabolism, simvastatin can accumulate which increases the risk of liver toxicity, myopathy, or rhabdomyolysis.  Simvastatin dose should not exceed 10mg /day in patients taking verapamil, diltiazem, fibrates, or niacin >or= 1g/day.   Simvastatin dose should not exceed 20mg /day in patients taking amlodipine, ranolazine or amiodarone.   Please consider this potential interaction at discharge.   Thank you, Tammy Sours, PharmD Pharmacy [phone]

## 2012-03-26 NOTE — Progress Notes (Signed)
Spoke with patient and husband at bedside to discuss discharge planning. Mr Twombly admits that he can not take care of patient at home. Discussed at bedside about possibility of short term SNF at discharge. Patient did not refuse. She mouthed and said she did not want to go to [facility] (stayed there previously). Husband also reports patient has stayed at [facility] in the past as well. Aware that therapy recommended home health care initially. Asked therapy to reevaluate as husband states patient is not managing well at home and SNF is needed. Made inpatient social worker and inpatient case manager aware of husband's request for SNF.   Raiford Noble, MSN- Ed, RN,BSN [facility] Liaison [phone]

## 2012-03-26 NOTE — Evaluation (Addendum)
Physical Therapy Evaluation Patient Details Name: [name] MRN: [id number] DOB: 23-Mar-1941 Today's Date: 03/26/2012 Time: 4098-1191 PT Time Calculation (min): 30 min  PT Assessment / Plan / Recommendation Clinical Impression  Pt adm with szs and has a very complex PMHx incuding CVA, oral CA    PT Assessment  Patient needs continued PT services    Follow Up Recommendations  Skilled Nursing  Facility    Barriers to Discharge        Equipment Recommendations  None recommended by PT    Recommendations for Other Services     Frequency Min 3X/week    Precautions / Restrictions Precautions Precautions: Fall   Pertinent Vitals/Pain       Mobility  Bed Mobility Bed Mobility: Supine to Sit Supine to Sit: 4: Min assist Details for Bed Mobility Assistance: cues for safety and self assist Transfers Transfers: Sit to Stand;Stand to Sit Sit to Stand: 4: Min assist Stand to Sit: 4: Min assist Details for Transfer Assistance: Min assist for safety due to pt very unsteady and impulsive with all mobility. Max cues for safety and hand placement when sitting/standing Ambulation/Gait Ambulation/Gait Assistance: 4: Min assist Ambulation Distance (Feet): 100 Feet Assistive device: Rolling walker Ambulation/Gait Assistance Details: assist for RW safetya nd balance throughout Gait Pattern: Narrow base of support;Ataxic;Lateral trunk lean to right    Exercises     PT Diagnosis: Difficulty walking  PT Problem List: Decreased activity tolerance;Decreased balance;Decreased mobility;Decreased knowledge of use of DME;Decreased strength;Decreased coordination PT Treatment Interventions: DME instruction;Gait training;Stair training;Functional mobility training;Therapeutic activities;Therapeutic exercise;Balance training;Patient/family education   PT Goals Acute Rehab PT Goals PT Goal Formulation: With patient Time For Goal Achievement: 03/26/12 Potential to Achieve Goals: Good Pt  will go Sit to Stand: with supervision PT Goal: Sit to Stand - Progress: Goal set today Pt will go Stand to Sit: with supervision PT Goal: Stand to Sit - Progress: Goal set today Pt will Ambulate: 16 - 50 feet;with supervision PT Goal: Ambulate - Progress: Goal set today Pt will Perform Home Exercise Program: with supervision, verbal cues required/provided PT Goal: Perform Home Exercise Program - Progress: Goal set today  Visit Information  Last PT Received On: 03/26/12 Assistance Needed: +2 PT/OT Co-Evaluation/Treatment: Yes    Subjective Data  Subjective: pt mostly having husband lip read for her Patient Stated Goal: none stated   Prior Functioning  Home Living Lives With: Spouse Available Help at Discharge: Family Type of Home: House Home Access: Stairs to enter Secretary/administrator of Steps: 3 Entrance Stairs-Rails: Right;Left;Can reach both Home Layout: One level Bathroom Shower/Tub: Health visitor: Standard Home Adaptive Equipment: Bedside commode/3-in-1;Straight cane;Walker - standard;Grab bars in shower;Grab bars around toilet Prior Function Level of Independence: Independent with assistive device(s) Able to Take Stairs?: Yes Driving: No Vocation: Retired Musician: Tracheostomy Dominant Hand: Left    Cognition  Arousal/Alertness: Awake/alert Orientation Level: Appears intact for tasks assessed Following Commands: Follows one step commands consistently    Extremity/Trunk Assessment Right Lower Extremity Assessment RLE ROM/Strength/Tone Deficits: hx of right sided weakness, AROM WFL RLE Sensation: Deficits RLE Sensation Deficits: sensation absent to light touch to firm pressure RLE RLE Coordination: Deficits RLE Coordination Deficits: ??ataxic movements Left Lower Extremity Assessment LLE ROM/Strength/Tone: WFL for tasks assessed LLE Sensation: WFL - Light Touch   Balance Static Standing Balance Static Standing - Balance  Support: Bilateral upper extremity supported;During functional activity Static Standing - Level of Assistance: 4: Min assist  End of Session PT - End  of Session Equipment Utilized During Treatment: Gait belt Activity Tolerance: Patient tolerated treatment well Patient left: in chair;with call bell/phone within reach;with family/visitor present  GP Functional Assessment Tool Used: clinical judgement Functional Limitation: Mobility: Walking and moving around Mobility: Walking and Moving Around Current Status (N5621): At least 20 percent but less than 40 percent impaired, limited or restricted Mobility: Walking and Moving Around Goal Status [phone]): At least 1 percent but less than 20 percent impaired, limited or restricted   [facility] 03/26/2012, 2:08 PM

## 2012-03-26 NOTE — Progress Notes (Signed)
Pt had several seizures during the day. Husband states she has a small smile just before she starts to have a seizure, then her right hand becomes clenched. They last a few seconds. Pt transfers with assist of two and a walker to bedside commode. She states her Rt. Top foot was hurting her,refused pain med. [name] sat up in chair for a few hours today tolerated well. Appetite was fair. Pt needs order for Lidocaine 2% for her gums before she can put her dentures on. That's what she uses at home.

## 2012-03-26 NOTE — Progress Notes (Signed)
CARE MANAGEMENT NOTE 03/26/2012  Patient:  [name], [name]   Account Number:  [id number]  Date Initiated:  03/26/2012  Documentation initiated by:  Colleen Can  Subjective/Objective Assessment:   DX SEIZURE DISORDER  [facility] CASE MANAGEMNT IS FOLLOWING PATIENT     Action/Plan:   RECOMMENDATIONS FOR SNF PER PHYSICAL THERAPY; REFERRAL MADE TO CSW   Anticipated DC Date:  03/27/2012   Anticipated DC Plan:  SKILLED NURSING FACILITY  In-house referral  Clinical Social Worker         Choice offered to / List presented to:             Status of service:  Completed, signed off  Per UR Regulation:  Reviewed for med. necessity/level of care/duration of stay

## 2012-03-26 NOTE — Progress Notes (Signed)
Subjective: [name] was recently hospitalized for seizure that was due to her running out of Keppra and without dose for several days. She was seen by neurohospitalist that admission: she had full evaluation. 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 She was discharged on her home dose of Keppra and was to follow up with her outpatient neurologist.  At home she was reported to have muscle spasms right leg. Flexeril was called in for this. She then went on to have recurrent seizure activity leading to return to ED and admission. Neurohospitalist has seen the patient and recommends continuing with Keppra but at increased dose. No additional neuro testing was felt needed.   This AM she is somnolent and not her usual active self. Does respond to verbal stimuli  Objective: Lab: Lab Results  Component Value Date   WBC 12.4* 03/25/2012   HGB 13.1 03/25/2012   HCT 36.5 03/25/2012   MCV 88.8 03/25/2012   PLT 242 03/25/2012   BMET    Component Value Date/Time   NA 126* 03/26/2012 0352   K 3.4* 03/26/2012 0352   CL 91* 03/26/2012 0352   CO2 26 03/26/2012 0352   GLUCOSE 112* 03/26/2012 0352   BUN 10 03/26/2012 0352   CREATININE 0.50 03/26/2012 0352   CALCIUM 8.5 03/26/2012 0352   GFRNONAA >90 03/26/2012 0352   GFRAA >90 03/26/2012 0352     Imaging:Admission studies reviewed.  Scheduled Meds:   . aspirin  81 mg Oral Daily  . calcium carbonate  1,250 mg Oral Daily  . diltiazem  180 mg Oral Daily  . Ensure Plus  237 mL Oral TID BM  . folic acid  1 mg Oral Daily  . levetiracetam  1,000 mg Intravenous Q12H  . levetiracetam  500 mg Intravenous Once  . levothyroxine  50 mcg Oral Daily  . LORazepam      . mesalamine  2.4 g Oral BID  . multivitamin with minerals  1 tablet Oral Daily  . simvastatin  20 mg Oral q1800  . sodium chloride  3 mL Intravenous Q12H  . vitamin C  500 mg Oral Daily  . DISCONTD: ciprofloxacin  400 mg Intravenous  Once  . DISCONTD: levETIRAcetam  1,000 mg Oral Once   Continuous Infusions:   . sodium chloride 100 mL/hr at 03/26/12 0040   PRN Meds:.sodium chloride, baclofen, LORazepam, sodium chloride, DISCONTD: LORazepam, DISCONTD: LORazepam   Physical Exam: Filed Vitals:   03/26/12 0445  BP: 95/62  Pulse: 75  Temp: 97.8 F (36.6 C)  Resp: 16        Assessment/Plan: 1. Neuro - poorly controlled seizure disorder. Appreciate Neurology consult and assistance.  Plan - continue higher dose of Keppra  Recommendation for MRI if no improvement in right LE weakness noted  Nursing suggests decrease in ativan dose - will defer decision to neuro  2. Hyponatremia - she has chronic hyponatremia  Plan NS  Liberalize sodium in diet  Fluid restriction 1.5 liters/day.   [name] IM (o) [phone]; (c) [phone] Call-grp - [name] IM Tele: [phone]  03/26/2012, 8:05 AM

## 2012-03-27 ENCOUNTER — Encounter (HOSPITAL_COMMUNITY): Payer: Self-pay

## 2012-03-27 DIAGNOSIS — D509 Iron deficiency anemia, unspecified: Secondary | ICD-10-CM

## 2012-03-27 LAB — BASIC METABOLIC PANEL
BUN: 10 mg/dL (ref 6–23)
CO2: 27 mEq/L (ref 19–32)
Calcium: 8.8 mg/dL (ref 8.4–10.5)
Creatinine, Ser: 0.48 mg/dL — ABNORMAL LOW (ref 0.50–1.10)
GFR calc non Af Amer: >90 mL/min (ref >90)
Glucose, Bld: 111 mg/dL — ABNORMAL HIGH (ref 70–99)
Sodium: 129 mEq/L — ABNORMAL LOW (ref 135–145)

## 2012-03-27 LAB — GLUCOSE, CAPILLARY

## 2012-03-27 MED ORDER — LIDOCAINE HCL 2 % EX GEL
Freq: Once | CUTANEOUS | Status: AC
  Administered 2012-03-27: 12:00:00 via TOPICAL
  Filled 2012-03-27: qty 5

## 2012-03-27 NOTE — Progress Notes (Signed)
[facility] PROGRESS NOTE    SUBJECTIVE   No complaints. Per chart patient had a episode of fist clenching on the right yesterday.  Nurse informs me it did not look like a seizure but more of fist clenching.   OBJECTIVE   Vital signs in last 24 hours: Temp:  [97.4 F (36.3 C)-99.5 F (37.5 C)] 98.5 F (36.9 C) (09/24 0507) Pulse Rate:  [85-100] 88  (09/24 0710) Resp:  [18-20] 18  (09/24 0710) BP: (107-139)/(54-75) 137/69 mmHg (09/24 0507) SpO2:  [95 %-98 %] 97 % (09/24 0710)  Intake/Output from previous day: 09/23 0701 - 09/24 0700 In: 3360 [P.O.:840; I.V.:2300; IV Piggyback:220] Out: 1250 [Urine:1250] Intake/Output this shift:   Nutritional status: General  Past Medical History  Diagnosis Date  . Peripheral vascular disease   . Subdural hematoma   . Ataxia   . Mild dysplasia of cervix   . UTI (lower urinary tract infection)   . Urinary incontinence   . Seizures   . Cerebrovascular disease   . Hypothyroidism   . Personal history of colonic polyps     adenomatous 1997 & tubular adenoma and hyplastic  2008  . Hyperlipidemia   . Adenocarcinoma, breast     bilateral  . H/O alcohol abuse   . Diverticulosis of colon (without mention of hemorrhage)   . Redundant colon   . Squamous cell carcinoma of mouth   . Stroke   . C. difficile colitis   . Dementia t  . Ulcerative colitis     Neurologic Exam:  Mental Status:  Alert, oriented, thought content appropriate. Cannot speak without electronic device. No signs of aphasia. Able to follow 3 step commands without difficulty.  Cranial Nerves:  II: Visual fields grossly normal, pupils equal, round, reactive to light and accommodation  III,IV, VI: ptosis not present, extra-ocular motions intact bilaterally  V,VII: smile symmetric, facial light touch sensation normal bilaterally  VIII: hearing normal bilaterally  IX,X: gag reflex present  XI: bilateral shoulder shrug    XII: midline tongue extension  Motor:  Right : Upper extremity 5/5 Left: Upper extremity 5/5  Lower extremity 5/5 Lower extremity 5/5  Tone and bulk:normal tone throughout; no atrophy noted  Sensory: Pinprick and light touch intact throughout, bilaterally  Deep Tendon Reflexes: 2+ and symmetric throughout with exception of right bicep and tricep which is 3+  Plantars:  Right: up Left: downgoing  Cerebellar:  normal finger-to-nose, normal heel-to-shin test  CV: pulses palpable throughout    Lab Results: Lab Results  Component Value Date/Time   CHOL 173 05/06/2011  4:43 PM   Lipid Panel No results found for this basename: CHOL,TRIG,HDL,CHOLHDL,VLDL,LDLCALC in the last 72 hours  Studies/Results: No results found.  Medications:     Scheduled:   . aspirin  81 mg Oral Daily  . atorvastatin  10 mg Oral q1800  . calcium carbonate  1,250 mg Oral Daily  . diltiazem  180 mg Oral Daily  . feeding supplement  237 mL Oral TID BM  . folic acid  1 mg Oral Daily  . lacosamide  50 mg Oral BID  . levetiracetam  1,000 mg Intravenous Q12H  . levothyroxine  50 mcg Oral Daily  . lidocaine   Topical Once  . LORazepam      .  mesalamine  2.4 g Oral BID  . multivitamin with minerals  1 tablet Oral Daily  . sodium chloride  3 mL Intravenous Q12H  . vitamin C  500 mg Oral Daily  . DISCONTD: Ensure Plus  237 mL Oral TID BM  . DISCONTD: simvastatin  20 mg Oral q1800    Assessment/Plan:   Impression: 71 year old female with seizure disorder secondary to either her previous head injury or her subsequent CVA. She was previously controlled on phenobarbital and Dilantin, however, she has not failed Keppra given that she is only on 500 twice a day. She has been on 1000 mg BID since in hospital and had one episode of right UE jerking. We added Vimpat 50 mg BID yesterday.  Fist clenching episode occurred prior to addition to Vimpat.    Recommendations:  1) Keppra 1000 mg every 12 hours  2) Vimpat  50 mg BID added yesterday 3) I have notified nurses to document exactly what occurs if patient is thought to have another seizure.  We have room to move up on Vimpat if she continues to seize.     Felicie Morn PA-C [facility] [phone]  03/27/2012, 8:22 AM

## 2012-03-27 NOTE — Progress Notes (Signed)
Subjective: Appreciate neuro's help. Per RN notes patient continued to have seizures during the day 9/23.  Noted that patient's husband feels her needs exceed his ability and that ST-SNF will be needed.  Much more awake and alert this AM Objective: Lab: Lab Results  Component Value Date   WBC 12.4* 03/25/2012   HGB 13.1 03/25/2012   HCT 36.5 03/25/2012   MCV 88.8 03/25/2012   PLT 242 03/25/2012   BMET    Component Value Date/Time   NA 129* 03/27/2012 0525   K 3.6 03/27/2012 0525   CL 94* 03/27/2012 0525   CO2 27 03/27/2012 0525   GLUCOSE 111* 03/27/2012 0525   BUN 10 03/27/2012 0525   CREATININE 0.48* 03/27/2012 0525   CALCIUM 8.8 03/27/2012 0525   GFRNONAA >90 03/27/2012 0525   GFRAA >90 03/27/2012 0525     Imaging: no new imaging  Scheduled Meds:   . aspirin  81 mg Oral Daily  . atorvastatin  10 mg Oral q1800  . calcium carbonate  1,250 mg Oral Daily  . diltiazem  180 mg Oral Daily  . feeding supplement  237 mL Oral TID BM  . folic acid  1 mg Oral Daily  . lacosamide  50 mg Oral BID  . levetiracetam  1,000 mg Intravenous Q12H  . levothyroxine  50 mcg Oral Daily  . lidocaine   Topical Once  . LORazepam      . mesalamine  2.4 g Oral BID  . multivitamin with minerals  1 tablet Oral Daily  . sodium chloride  3 mL Intravenous Q12H  . vitamin C  500 mg Oral Daily  . DISCONTD: Ensure Plus  237 mL Oral TID BM  . DISCONTD: simvastatin  20 mg Oral q1800   Continuous Infusions:   . sodium chloride 1,000 mL (03/26/12 0844)   PRN Meds:.sodium chloride, baclofen, LORazepam, sodium chloride   Physical Exam: Filed Vitals:   03/27/12 0710  BP:   Pulse: 88  Temp:   Resp: 18    Intake/Output Summary (Last 24 hours) at 03/27/12 0758 Last data filed at 03/27/12 0600  Gross per 24 hour  Intake 3359.99 ml  Output   1250 ml  Net 2109.99 ml   Gen'l- thin white woman in no distress. Able to phonate with larynx amplifier HEENT- C&S clear, PERRLA Cor- 2+ radial , RRR Pulm -  normal respirations, lungs are clear to A&P Neuro - Awake, alert, communicating well. MAE including right LE      Assessment/Plan: 1. Seizures - no report of seizure overnight and the patient denies any additional seizures. On Keppra 1g bid  Plan Medication management per Neuro service  2. Hyponatremia - improved slightly  Plan - d/c/ IVF  Continue fluid restriction  3. dispo - patient is will to go to [facility] but not [facility] or the facility in [place]. May be ready to transfer as soon as tomorrow.    Illene Regulus [place] [facility] (o) [phone]; (c) [phone] Call-grp - Patsi Sears IM Tele: [phone]  03/27/2012, 7:57 AM

## 2012-03-27 NOTE — Progress Notes (Signed)
Spoke to husband via phone- we have discussed possible d/c plans- ?SNF vs. Home. I have sent information to insurance to see if they would be able to approve her for SNF at d/c. Will advise tomorrow on further plans Reece Levy, MSW, [employer] [phone]

## 2012-03-27 NOTE — Progress Notes (Signed)
Physical Therapy Treatment Patient Details Name: [name] MRN: [id number] DOB: Nov 09, 1940 Today's Date: 03/27/2012 Time: 4098-1191 PT Time Calculation (min): 30 min  PT Assessment / Plan / Recommendation Comments on Treatment Session  Pt improved.  She is anxious to return to home.   Recommend home If she can have 24/7 supv at home with family/sitter service and HHPT.  If this is not available, she will need ST SNF to regain strength and functional independence    Follow Up Recommendations  Skilled nursing facility;Home health PT (depends on availability of 24/7 supv)    Barriers to Discharge        Equipment Recommendations  None recommended by PT    Recommendations for Other Services    Frequency Min 3X/week   Plan Discharge plan needs to be updated    Precautions / Restrictions     Pertinent Vitals/Pain No c/o pain    Mobility  Bed Mobility Details for Bed Mobility Assistance: up sitting on EOB independently Transfers Transfers: Sit to Stand;Stand to Sit Sit to Stand: 4: Min guard Stand to Sit: 4: Min guard Details for Transfer Assistance: verbal cues to push up with arms and encouragement to extend trunk to stand up straight Ambulation/Gait Ambulation/Gait Assistance: 5: Supervision Ambulation Distance (Feet): 350 Feet (150, 200) Assistive device: Rolling walker Ambulation/Gait Assistance Details: cues to stand erect Gait Pattern: Right circumduction;Right foot flat Gait velocity: decreased General Gait Details: pt is able to do well with RW today, though problems with right leg appear more obvious with fatigue Stairs: Yes Stairs Assistance: 4: Min assist Stairs Assistance Details (indicate cue type and reason): pt needed cues to go up one step at time.  She held onto one handrail with both hands. She was instructed to turn to face handrail, but she stayed facing forward  Pt able to verbalize correct sequence to lead with stronger leg while ascending and  weaker leg when descending Stair Management Technique: One rail Left Number of Stairs: 2  Wheelchair Mobility Wheelchair Mobility: No    Exercises General Exercises - Lower Extremity Hip ABduction/ADduction: AROM;Both;5 reps;Standing Hip Flexion/Marching: AROM;Both;5 reps;Standing   PT Diagnosis:    PT Problem List:   PT Treatment Interventions:     PT Goals Acute Rehab PT Goals PT Goal Formulation: With patient Time For Goal Achievement: 03/26/12 Potential to Achieve Goals: Good Pt will go Sit to Stand: with supervision PT Goal: Sit to Stand - Progress: Progressing toward goal Pt will go Stand to Sit: with supervision PT Goal: Stand to Sit - Progress: Progressing toward goal Pt will Ambulate: 16 - 50 feet;with supervision PT Goal: Ambulate - Progress: Progressing toward goal Pt will Perform Home Exercise Program: with supervision, verbal cues required/provided PT Goal: Perform Home Exercise Program - Progress: Progressing toward goal  Visit Information  Last PT Received On: 03/27/12 Assistance Needed: +1    Subjective Data  Subjective: Pt asked if I thought she could go home with HHPT Patient Stated Goal: to walk with a cane   Cognition  Overall Cognitive Status: Appears within functional limits for tasks assessed/performed Difficult to assess due to: Tracheostomy Arousal/Alertness: Awake/alert Orientation Level: Appears intact for tasks assessed;Oriented X4 / Intact Behavior During Session: [facility] for tasks performed Following Commands: Follows multi-step commands inconsistently Cognition - Other Comments: pt has a sense of humor today, smiling, appreciative of PT.  Using electrolarynx and writing board for communication    Balance  Static Standing Balance Static Standing - Balance Support: No upper  extremity supported;Bilateral upper extremity supported;Right upper extremity supported Static Standing - Level of Assistance: 5: Stand by assistance (worked on maintaining  balance while moving UEs:3 minutes)  End of Session PT - End of Session Equipment Utilized During Treatment: Gait belt Activity Tolerance: Patient tolerated treatment well Patient left: in chair;with call bell/phone within reach Nurse Communication: Mobility status   GP     Rosey Bath K. Manson Passey, Pt [phone] 03/27/2012, 3:05 PM

## 2012-03-28 LAB — GLUCOSE, CAPILLARY

## 2012-03-28 NOTE — Progress Notes (Signed)
Physical Therapy Treatment Patient Details Name: [name] MRN: [id number] DOB: 09-22-1940 Today's Date: 03/28/2012 Time: 3086-5784 PT Time Calculation (min): 25 min  PT Assessment / Plan / Recommendation Comments on Treatment Session  continues improvement in safety and exercise tolerance.  Still with RLE symptoms increasing with fatigue    Follow Up Recommendations  Home health PT (depends on availability of 24/7 supv)    Barriers to Discharge        Equipment Recommendations  None recommended by PT    Recommendations for Other Services    Frequency Min 3X/week   Plan Discharge plan needs to be updated    Precautions / Restrictions     Pertinent Vitals/Pain Pt c/o pain in right ankle that "feels better" after walking    Mobility  Bed Mobility Bed Mobility: Rolling Right;Rolling Left Rolling Right: 6: Modified independent (Device/Increase time);With rail Rolling Left: 6: Modified independent (Device/Increase time);With rail Supine to Sit: 6: Modified independent (Device/Increase time);With rails Transfers Transfers: Sit to Stand;Stand to Sit Sit to Stand: 5: Supervision Stand to Sit: 5: Supervision Details for Transfer Assistance:  occasional cues to push up with arms and have trunk extension.  Pt moves slowly, but safely.  Repeated task 5 times for strengthening Ambulation/Gait Ambulation/Gait Assistance: 5: Supervision Ambulation Distance (Feet): 400 Feet Assistive device: Rolling walker Ambulation/Gait Assistance Details: occasional cues to keep head up Gait Pattern: Right circumduction;Right foot flat Gait velocity: decreased General Gait Details: pt is able to do well with RW today, though problems with right leg appear more obvious with fatigue Stairs: No Wheelchair Mobility Wheelchair Mobility: No    Exercises General Exercises - Lower Extremity Ankle Circles/Pumps: AROM;15 reps;Both;Supine Quad Sets: AROM;Both;10 reps;Supine Gluteal Sets:  AROM;Both;10 reps;Supine Short Arc Quad: AROM;Both;Supine;Limitations;AAROM Short Arc Quad Limitations: some difficulty isolating knee ROM over blanket roll. Pt wants to have total extremity movement.  With effort, she is able to isolate single joint movement Heel Slides: AROM;Both;5 reps;Supine Hip ABduction/ADduction: AROM;Both;5 reps;Supine;Sidelying   PT Diagnosis:    PT Problem List:   PT Treatment Interventions:     PT Goals Acute Rehab PT Goals PT Goal Formulation: With patient Time For Goal Achievement: 03/26/12 Potential to Achieve Goals: Good Pt will go Sit to Stand: Independently PT Goal: Sit to Stand - Progress: Updated due to goal met Pt will go Stand to Sit: Independently PT Goal: Stand to Sit - Progress: Updated due to goals met Pt will Ambulate: 16 - 50 feet;>150 feet;with modified independence PT Goal: Ambulate - Progress: Updated due to goal met Pt will Perform Home Exercise Program: Independently PT Goal: Perform Home Exercise Program - Progress: Updated due to goal met  Visit Information  Last PT Received On: 03/28/12 Assistance Needed: +1    Subjective Data  Subjective: "I'm ready now!" Patient Stated Goal: to go home tomorrow   Cognition  Overall Cognitive Status: Appears within functional limits for tasks assessed/performed Arousal/Alertness: Awake/alert Orientation Level: Appears intact for tasks assessed;Oriented X4 / Intact Behavior During Session: [facility] for tasks performed Following Commands: Follows multi-step commands inconsistently    Balance     End of Session PT - End of Session Equipment Utilized During Treatment: Gait belt Activity Tolerance: Patient tolerated treatment well Patient left: in bed;with call bell/phone within reach   GP   Rosey Bath K. Aarohi Redditt,PT [phone] 03/28/2012, 3:09 PM

## 2012-03-28 NOTE — Plan of Care (Signed)
Problem: Phase I Progression Outcomes Goal: Pain controlled with appropriate interventions Outcome: Completed/Met Date Met:  03/28/12 No c/o pain since admission.

## 2012-03-28 NOTE — Progress Notes (Signed)
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 Restrictions Weight Bearing Restrictions: No       ADL  Grooming: Performed;Wash/dry hands;Min guard Where Assessed - Grooming: Unsupported standing Toilet Transfer: Performed;Min guard Statistician Method: Sit to Barista: Regular height toilet;Grab bars Toileting - Clothing Manipulation and Hygiene: Performed;Min guard Where Assessed - Engineer, mining and Hygiene: Standing Equipment Used: Rolling walker Transfers/Ambulation Related to ADLs: Min guard A over 100 feet      OT Goals ADL Goals ADL Goal: Grooming - Progress: Progressing toward goals ADL Goal: Statistician - Progress: Progressing toward goals ADL Goal: Toileting - Clothing Manipulation - Progress: Progressing toward goals ADL Goal: Toileting - Hygiene - Progress: Met  Visit Information  Last OT Received On: 03/28/12 Assistance Needed: +1          Cognition  Overall Cognitive Status: Appears within functional limits for tasks assessed/performed Difficult to assess due to: Tracheostomy Arousal/Alertness: Awake/alert Behavior During Session: [facility] for tasks performed    Mobility  Shoulder Instructions Bed Mobility Bed Mobility: Supine to Sit;Sitting - Scoot to Edge of Bed;Sit to Supine Supine to Sit: 6: Modified independent  (Device/Increase time);HOB elevated Sitting - Scoot to Edge of Bed: 7: Independent Sit to Supine: 7: Independent;HOB flat Transfers Transfers: Sit to Stand;Stand to Sit Sit to Stand: 4: Min guard;With upper extremity assist;From bed Stand to Sit: 4: Min guard;With upper extremity assist;To bed             End of Session OT - End of Session Equipment Utilized During Treatment:  (RW) Activity Tolerance: Patient tolerated treatment well Patient left: in bed;with call bell/phone within reach       Evette Georges [phone] 03/28/2012, 7:50 PM

## 2012-03-28 NOTE — Progress Notes (Signed)
Subjective: Patient denies any recurrent seizure. She is feeling OK  Objective: Lab: Lab Results  Component Value Date   WBC 12.4* 03/25/2012   HGB 13.1 03/25/2012   HCT 36.5 03/25/2012   MCV 88.8 03/25/2012   PLT 242 03/25/2012   BMET    Component Value Date/Time   NA 129* 03/27/2012 0525   K 3.6 03/27/2012 0525   CL 94* 03/27/2012 0525   CO2 27 03/27/2012 0525   GLUCOSE 111* 03/27/2012 0525   BUN 10 03/27/2012 0525   CREATININE 0.48* 03/27/2012 0525   CALCIUM 8.8 03/27/2012 0525   GFRNONAA >90 03/27/2012 0525   GFRAA >90 03/27/2012 0525     Imaging:  Scheduled Meds:   . aspirin  81 mg Oral Daily  . atorvastatin  10 mg Oral q1800  . calcium carbonate  1,250 mg Oral Daily  . diltiazem  180 mg Oral Daily  . feeding supplement  237 mL Oral TID BM  . folic acid  1 mg Oral Daily  . lacosamide  50 mg Oral BID  . levetiracetam  1,000 mg Intravenous Q12H  . levothyroxine  50 mcg Oral Daily  . lidocaine   Topical Once  . mesalamine  2.4 g Oral BID  . multivitamin with minerals  1 tablet Oral Daily  . sodium chloride  3 mL Intravenous Q12H  . vitamin C  500 mg Oral Daily   Continuous Infusions:  PRN Meds:.sodium chloride, baclofen, LORazepam, sodium chloride   Physical Exam: Filed Vitals:   03/28/12 0516  BP: 139/69  Pulse: 86  Temp: 98.1 F (36.7 C)  Resp: 18    Intake/Output Summary (Last 24 hours) at 03/28/12 0853 Last data filed at 03/28/12 0851  Gross per 24 hour  Intake    240 ml  Output      0 ml  Net    240 ml   Gen'l- chronically ill appearing women in no distress HEENT- mild temporal wasting Neck - trach site looks OK Pulm - normal respirations Cor- RRR Abd- soft Neuro - A&O x 3, communication is good, MAE, no tremor      Assessment/Plan: 1. Neuro - seizures appear to be controlled on Keppra 1,000 mg bid and Vampta 50 mg bid.  2. Hyponatremia - no repeat lab. Plan - Bmet in AM  3. dispo - should be medically stable for d/c or transfer to SNF  tomorrow, 9/26   Illene Regulus [place] IM (o) [phone]; (c) [phone] Call-grp - Patsi Sears IM Tele: [phone]  03/28/2012, 8:50 AM

## 2012-03-28 NOTE — Progress Notes (Signed)
Submitted clinicals to [facility] and they have indicated patient would not be approved for SNF at this time based on her level of need- ambulating well with PT, etc. Met with husband and discussed this with him which he understands and is open to taking her back home as previously with [facility] services Genevieve Norlander PT,OT, RN, SW) and his care- he has used private duty care and will use this again as needed. Spent along time talking with husband- married 44 years, he has been providing care to her for a number of years at home and has done a good job- he does voice the challenges and the impact it has had on him to have to be caregiver and deal with her medical and mental needs (dementia). CSW validated his feelings and supported him as he vented- encouraged him to take care of himself and encouraged self care (golfing is his outlet).  Husband appreciative of CSW visit- RNCM advised for need for [facility] to be resumed at d/c. Please call CSW as needed,  Reece Levy, MSW, [place] [phone]

## 2012-03-29 DIAGNOSIS — C329 Malignant neoplasm of larynx, unspecified: Secondary | ICD-10-CM

## 2012-03-29 DIAGNOSIS — G8190 Hemiplegia, unspecified affecting unspecified side: Secondary | ICD-10-CM

## 2012-03-29 LAB — GLUCOSE, CAPILLARY: Glucose-Capillary: 105 mg/dL — ABNORMAL HIGH (ref 70–99)

## 2012-03-29 MED ORDER — LEVETIRACETAM 1000 MG PO TABS: 1000.0000 mg | ORAL_TABLET | Freq: Two times a day (BID) | ORAL | Status: DC

## 2012-03-29 MED ORDER — LACOSAMIDE 50 MG PO TABS: 50.0000 mg | ORAL_TABLET | Freq: Two times a day (BID) | ORAL | Status: DC

## 2012-03-29 NOTE — Progress Notes (Signed)
Patient seizure free for 36+ hrs - will not qualify for SNF. For d/c home. Home health ordered: F-to F completed. Family prefers [place]  Will see in office 7-10 days. Family to make appointment with Dr. Terrace Arabia  dictation # [phone]

## 2012-03-29 NOTE — Progress Notes (Signed)
CARE MANAGEMENT NOTE 03/29/2012  Patient:  [name], [name]   Account Number:  [id number]  Date Initiated:  03/26/2012  Documentation initiated by:  Colleen Can  Subjective/Objective Assessment:   DX SEIZURE DISORDER  [facility] CASE MANAGEMNT IS FOLLOWING PATIENT     Action/Plan:   RECOMMENDATIONS FOR SNF PER PHYSICAL THERAPY; REFERRAL MADE TO CSW   Anticipated DC Date:  03/27/2012   Anticipated DC Plan:  SKILLED NURSING FACILITY  In-house referral  Clinical Social Worker         Choice offered to / List presented to:          [facility] arranged  HH-1 RN  HH-2 PT      [facility] agency  [facility]   Status of service:  Completed, signed off Medicare Important Message given?  NA - LOS <3 / Initial given by admissions (If response is "NO", the following Medicare IM given date fields will be blank) Date Medicare IM given:   Date Additional Medicare IM given:    Discharge Disposition:  HOME W HOME HEALTH SERVICES  Per UR Regulation:  Reviewed for med. necessity/level of care/duration of stay  If discussed at Long Length of Stay Meetings, dates discussed:    Comments:  03/28/12 MPearson, RN, BSN Pt has [facility]. Plan to dc home with Genevieve Norlander for [facility]. Selected by pt and spouse.

## 2012-03-29 NOTE — Discharge Summary (Signed)
NAMEISRAA, [name]            ACCOUNT NO.:  [id number]  MEDICAL RECORD NO.:  [id number]  LOCATION:  1345                         FACILITY:  [facility]  PHYSICIAN:  Rosalyn Gess. Norins, MD  DATE OF BIRTH:  02-Jan-1941  DATE OF ADMISSION:  03/25/2012 DATE OF DISCHARGE:  03/29/2012                              DISCHARGE SUMMARY   ADMITTING DIAGNOSES: 1. Seizure with subtherapeutic Keppra dosing. 2. 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 3. Hyponatremia.  DISCHARGE DIAGNOSES: 1. Seizure with subtherapeutic Keppra dosing. 2. 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 3. Hyponatremia.  CONSULTANTS:  Felicie Morn, PA-C for Neurology.  PROCEDURES:  No imaging studies were performed at this admission.  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  The patient had been well managed by Dilantin and phenobarbital.  Her outpatient neurologist, Dr. Terrace Arabia due to concerns for toxic side effect of her medications, changed her to Keppra.  The patient was tapered off of Dilantin and phenobarbital.  She had been doing well on this new regimen.  The patient did have a problem with medication supplies, running out of Keppra for several days.  She then had a seizure on March 18, 2012, with multiple subsequent seizures.  The patient was admitted to the hospital and at that time, she was restarted on Keppra 500 mg p.o. b.i.d., which has been her previous outpatient dose.  The patient was returned to home.  She started having what was thought to be tremors on her right side, but then developed multiple seizures, difficulty with walking and was readmitted to the  hospital.  During her previous admission, she was found to have some right-sided weakness.  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  The patient was admitted for IV medication management with the assistance of Neurology consultation.  Please see her previous hospital admission and discharge summary, and the H and P for this admission for past medical history, family history, social history as well as physical exam.  HOSPITAL COURSE:  The patient was on IV loaded with Keppra at a 1000 mg q.12h.  A new drug was added to her regimen, using Vimpat twice daily. On this regimen, the patient was well controlled, then has not had a seizure for 36+ hours.  The patient was initially considered for skilled care placement for rehabilitation, but she has been doing well with physical therapy and occupational therapy and is felt to be able to return to home for management.  With the patient's seizures being controlled on increased dose of Keppra along with Vampat, a new medication to her regimen, with her medical problems being stable, at this time, she is ready for discharge to home.  DISCHARGE EXAMINATION:  VITAL SIGNS:  Temperature was 98.8, blood pressure 105/68, pulse was 75, respirations 20, O2 sats 95%.  GENERAL APPEARANCE:  This is a slender, is not thin, chronically ill-appearing, white woman in no acute distress. HEENT:  Conjunctivae and sclerae were clear.  The patient does have mild temporal wasting.  Oropharynx without open lesions or sores. NECK:  The patient has a tracheostomy site.  There is a cap over this at the time of discharge exam. PULMONARY:  The patient is moving air well.  She has no rales or wheezes.  No increased work of breathing. CARDIOVASCULAR:  2+ radial pulse.  Her precordium is quiet.  She has a regular rate and rhythm. BREAST:  Deferred. ABDOMEN:  Soft.  No guarding or rebound was  noted. GENITALIA:  Deferred. EXTREMITIES:  Without clubbing, cyanosis, or edema. NEUROLOGIC:  The patient is awake, she is alert.  She is able to communicate and does use a voice amplifier after her laryngectomy.  She moves all extremities to command.  FINAL LABORATORY:  Last chemistries from March 27, 2012; sodium of 129, potassium 3.6, chloride of 94, CO2 of 27, BUN of 10, creatinine is 0.48, glucose was 111.  Liver functions from March 25, 2012, were normal except for an albumin that was slightly low at 3.3.  CBC from March 25, 2012, with a hemoglobin of 13.1 g, white count was 12,400, with 82% segs, 10% lymphs, 8% monos, platelet count 242,000.  TSH from March 19, 2012, previous admission was 1.675.  DISPOSITION:  The patient is discharged to home.  She will have home health RN, PT, OT, and AIDS Services.  The patient is on a unrestricted diet except she is to have liberalize sodium intake and is encouraged to have salty snacks.  She also was on a fluid restriction of 1.5 liters per day in order to help control her chronically low sodium levels.  FOLLOWUP:  The patient will be seen in the office within 7-10 days.  She is instructed to contact [facility] for an appointment with Dr. Terrace Arabia for follow up.  The patient's condition at the time of discharge dictation is medically stable and seizure free.     Rosalyn Gess Norins, MD     MEN/MEDQ  D:  03/29/2012  T:  03/29/2012  Job:  [id number]

## 2012-04-02 ENCOUNTER — Telehealth: Payer: Self-pay

## 2012-04-02 NOTE — Telephone Encounter (Signed)
New orders and face to face encounter done at time of d/c.  That is a yes

## 2012-04-02 NOTE — Telephone Encounter (Signed)
RN called requesting orders to continue [facility] and PT services since recent hospital discharge.

## 2012-04-02 NOTE — Telephone Encounter (Signed)
Ok for aide services

## 2012-04-02 NOTE — Telephone Encounter (Signed)
[place] nurse Marylene Land notified of approval for continue of HH and PT. Marland Kitchen Home health nurse also would like to know if ok for patient to have Home Health Aide to assist with bath and personal care. Please advise. Fannie Knee 04/02/2012  3;30pm

## 2012-04-09 ENCOUNTER — Ambulatory Visit (INDEPENDENT_AMBULATORY_CARE_PROVIDER_SITE_OTHER): Payer: Medicare Other | Admitting: Internal Medicine

## 2012-04-09 ENCOUNTER — Telehealth: Payer: Self-pay | Admitting: Internal Medicine

## 2012-04-09 ENCOUNTER — Encounter: Payer: Self-pay | Admitting: Internal Medicine

## 2012-04-09 VITALS — BP 132/60 | HR 77 | Temp 98.7°F | Resp 14 | Wt 120.0 lb

## 2012-04-09 DIAGNOSIS — D509 Iron deficiency anemia, unspecified: Secondary | ICD-10-CM

## 2012-04-09 DIAGNOSIS — I10 Essential (primary) hypertension: Secondary | ICD-10-CM

## 2012-04-09 DIAGNOSIS — G8190 Hemiplegia, unspecified affecting unspecified side: Secondary | ICD-10-CM

## 2012-04-09 DIAGNOSIS — E871 Hypo-osmolality and hyponatremia: Secondary | ICD-10-CM

## 2012-04-09 DIAGNOSIS — K5190 Ulcerative colitis, unspecified, without complications: Secondary | ICD-10-CM

## 2012-04-09 MED ORDER — MESALAMINE 1.2 G PO TBEC: 2.4000 g | DELAYED_RELEASE_TABLET | Freq: Two times a day (BID) | ORAL | Status: DC

## 2012-04-09 NOTE — Telephone Encounter (Signed)
Asking for Lialda samples. Will be at Dr. Debby Bud office today.. Samples up front for pick up.

## 2012-04-11 NOTE — Progress Notes (Signed)
Subjective:    Patient ID: Loanne Drilling, female    DOB: 01/27/1941, 71 y.o.   MRN: [id number]  HPI Mrs. Borunda presents for hospital follow-up: she was hospitalized at [facility] Sept 22-26th for seizure due to sub-therapeutic Keppra level. She had had a prior hospitalization several day previous having run out of Keppra. 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 Patient was seen by hospitalist neuro team: her Keppra was increased and Vimpat was added. Since d/c she has been seizure free. She has been unable to obtain a follow-up appointment or call from Dr. Terrace Arabia but has been able to continue on medications. They were assisted in requesting a follow up appointment with Dr. Lesia Sago.  She has been doing well: eating better and taking supplements. She is in excellent spirits at today's visit.  Past Medical History  Diagnosis Date  . Peripheral vascular disease   . Subdural hematoma   . Ataxia   . Mild dysplasia of cervix   . UTI (lower urinary tract infection)   . Urinary incontinence   . Seizures   . Cerebrovascular disease   . Personal history of colonic polyps     adenomatous 12/24/1995 & tubular adenoma and hyplastic  2006-12-24  . Hyperlipidemia   . H/O alcohol abuse   . Diverticulosis of colon (without mention of hemorrhage)   . Redundant colon   . Squamous cell carcinoma of mouth   . Stroke   . C. difficile colitis   . Dementia t  . Ulcerative colitis   . Adenocarcinoma, breast     bilateral  . Hypothyroidism    Past Surgical History  Procedure Date  . Subdural hematoma evacuation via craniotomy   . 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 Multiple tooth extractions     due to oral cancer  . Cataract extraction, bilateral   . Breast lumpectomy     left breast with radiation therapy  . Carotid endarterectomy     left  . Mastectomy     Right, history with nodule dissection  . Orif hip  fracture Sept '12    Right hip: screw and plate repair.  . Larynx surgery 08/2010    [facility]   Family History  Problem Relation Age of Onset  . Coronary artery disease Mother   . Heart disease Mother   . Diabetes Sister   . Breast cancer Maternal Aunt   . Breast cancer Sister    History   Social History  . Marital Status: Married    Spouse Name: N/A    Number of Children: 2  . Years of Education: N/A   Occupational History  . retired     Sales executive   Social History Main Topics  . Smoking status: Former Smoker -- 40 years    Quit date: 10/26/2004  . Smokeless tobacco: Never Used  . Alcohol Use: No     normally 1 glass wine after dinner - none x 2.5 months  . Drug Use: No  . Sexually Active: Not Currently   Other Topics Concern  . Not on file   Social History Narrative   HSG. Married x 7 years divorced; married 12-24-2067. 1 son- died as a neonate, 1 son- 12/24/58. Retired- worked as a Sales executive.   End of life: has a living will- does not want futile/ heroic care; no cpr.  Marriage- reports marriage  is in good health (4/10)    Current Outpatient Prescriptions on File Prior to Visit  Medication Sig Dispense Refill  . aspirin 81 MG chewable tablet Chew 81 mg by mouth daily.      . calcium carbonate (OS-CAL) 600 MG TABS Take 600 mg by mouth daily.      Marland Kitchen diltiazem (CARDIZEM CD) 180 MG 24 hr capsule Take 1 capsule (180 mg total) by mouth daily.  90 capsule  3  . Ensure Plus (ENSURE PLUS) LIQD Take 237 mLs by mouth 3 (three) times daily between meals.       . folic acid (FOLVITE) 1 MG tablet Take 1 tablet (1 mg total) by mouth daily.  90 tablet  1  . lacosamide (VIMPAT) 50 MG TABS Take 1 tablet (50 mg total) by mouth 2 (two) times daily.  60 tablet  11  . levETIRAcetam (KEPPRA) 1000 MG tablet Take 1 tablet (1,000 mg total) by mouth every 12 (twelve) hours.  60 tablet  11  . levothyroxine (SYNTHROID, LEVOTHROID) 50 MCG tablet Take 1 tablet (50 mcg total) by mouth daily.  90  tablet  3  . lidocaine (XYLOCAINE) 2 % solution Take 20 mLs by mouth every 3 (three) hours as needed. Oral pain      . Multiple Vitamin (MULTIVITAMIN WITH MINERALS) TABS Take 1 tablet by mouth daily.      . pravastatin (PRAVACHOL) 40 MG tablet Take 1 tablet (40 mg total) by mouth every evening.  90 tablet  3  . vitamin C (ASCORBIC ACID) 500 MG tablet Take 500 mg by mouth daily.      . baclofen (LIORESAL) 10 MG tablet Take 10 mg by mouth 3 (three) times daily as needed. spasms          Review of Systems System review is negative for any constitutional, cardiac, pulmonary, GI or neuro symptoms or complaints other than as described in the HPI.     Objective:   Physical Exam Filed Vitals:   04/09/12 1540  BP: 132/60  Pulse: 77  Temp: 98.7 F (37.1 C)  Resp: 14   Wt Readings from Last 3 Encounters:  04/09/12 120 lb (54.432 kg)  03/26/12 118 lb 6.2 oz (53.7 kg)  03/19/12 110 lb 0.2 oz (49.9 kg)   Gen'l- thin, white woman in no distress HEENT- mild temporal wasting Neck - trach in place with button Cor- RRR Pulm - no labored respirations, no wheezing Neuro - Awake and alert, oriented to person, place and context. Ambulates with a rolling walker and moves very quickly.       Assessment & Plan:

## 2012-04-11 NOTE — Assessment & Plan Note (Signed)
BP Readings from Last 3 Encounters:  04/09/12 132/60  03/29/12 105/68  03/20/12 140/52   OK control on present medication

## 2012-04-11 NOTE — Assessment & Plan Note (Signed)
Stable. Follow up with Dr. Jarold Motto as instructed

## 2012-04-11 NOTE — Assessment & Plan Note (Signed)
September '13 - following series of seizure's. Neuro-hospitalist diagnosed Todd's paralysis. This problem has markedly improved as of today's visit.

## 2012-04-11 NOTE — Assessment & Plan Note (Signed)
Lab Results  Component Value Date   HGB 13.1 03/25/2012

## 2012-04-11 NOTE — Assessment & Plan Note (Signed)
BMET    Component Value Date/Time   NA 129* 03/27/2012 0525   K 3.6 03/27/2012 0525   CL 94* 03/27/2012 0525   CO2 27 03/27/2012 0525   GLUCOSE 111* 03/27/2012 0525   BUN 10 03/27/2012 0525   CREATININE 0.48* 03/27/2012 0525   CALCIUM 8.8 03/27/2012 0525   GFRNONAA >90 03/27/2012 0525   GFRAA >90 03/27/2012 0525   Patient had been treated with fluid restriction and had also received IV NS.  Plan- continue on fluid restriction 1.5-2 liters per day.

## 2012-05-01 ENCOUNTER — Telehealth: Payer: Self-pay | Admitting: Internal Medicine

## 2012-05-01 NOTE — Telephone Encounter (Signed)
LVM for [name] at [facility] care management, regarding pt's Lialda. I told her I will leave some samples for someone to pick up at our front desk.

## 2012-05-02 ENCOUNTER — Telehealth: Payer: Self-pay

## 2012-05-02 DIAGNOSIS — G8190 Hemiplegia, unspecified affecting unspecified side: Secondary | ICD-10-CM

## 2012-05-02 NOTE — Telephone Encounter (Signed)
[name] with Genevieve Norlander called to inform MD that pt is doing great, has met all goal and has now been D/C from home health PT. Patient would like to be set up for outpatient  PT with [facility] Neurology. Thanks

## 2012-05-02 NOTE — Telephone Encounter (Signed)
Ok for outpatient PT-neurorehab. Order to [facility]

## 2012-05-18 ENCOUNTER — Ambulatory Visit: Payer: Medicare Other | Admitting: Physical Therapy

## 2012-05-21 ENCOUNTER — Ambulatory Visit: Payer: Medicare Other | Attending: Neurology | Admitting: Physical Therapy

## 2012-05-21 DIAGNOSIS — R293 Abnormal posture: Secondary | ICD-10-CM | POA: Insufficient documentation

## 2012-05-21 DIAGNOSIS — R269 Unspecified abnormalities of gait and mobility: Secondary | ICD-10-CM | POA: Insufficient documentation

## 2012-05-21 DIAGNOSIS — IMO0001 Reserved for inherently not codable concepts without codable children: Secondary | ICD-10-CM | POA: Insufficient documentation

## 2012-05-25 ENCOUNTER — Ambulatory Visit: Payer: Medicare Other | Admitting: Physical Therapy

## 2012-05-29 ENCOUNTER — Ambulatory Visit: Payer: Medicare Other | Admitting: Physical Therapy

## 2012-05-30 ENCOUNTER — Ambulatory Visit: Payer: Medicare Other | Admitting: Physical Therapy

## 2012-06-04 ENCOUNTER — Ambulatory Visit: Payer: Medicare Other | Attending: Neurology | Admitting: Physical Therapy

## 2012-06-04 DIAGNOSIS — IMO0001 Reserved for inherently not codable concepts without codable children: Secondary | ICD-10-CM | POA: Insufficient documentation

## 2012-06-04 DIAGNOSIS — R269 Unspecified abnormalities of gait and mobility: Secondary | ICD-10-CM | POA: Insufficient documentation

## 2012-06-04 DIAGNOSIS — R293 Abnormal posture: Secondary | ICD-10-CM | POA: Insufficient documentation

## 2012-06-06 ENCOUNTER — Ambulatory Visit: Payer: Medicare Other | Admitting: Physical Therapy

## 2012-06-13 ENCOUNTER — Ambulatory Visit: Payer: Medicare Other | Admitting: Physical Therapy

## 2012-06-15 ENCOUNTER — Ambulatory Visit: Payer: Medicare Other | Admitting: *Deleted

## 2012-06-18 ENCOUNTER — Ambulatory Visit: Payer: Medicare Other | Admitting: Physical Therapy

## 2012-06-22 ENCOUNTER — Ambulatory Visit: Payer: Medicare Other | Admitting: Physical Therapy

## 2012-06-25 ENCOUNTER — Ambulatory Visit: Payer: Medicare Other | Admitting: Physical Therapy

## 2012-07-03 ENCOUNTER — Ambulatory Visit: Payer: Medicare Other | Admitting: Physical Therapy

## 2012-07-06 ENCOUNTER — Ambulatory Visit: Payer: Medicare Other | Attending: Neurology | Admitting: Physical Therapy

## 2012-07-06 DIAGNOSIS — R269 Unspecified abnormalities of gait and mobility: Secondary | ICD-10-CM | POA: Insufficient documentation

## 2012-07-06 DIAGNOSIS — R293 Abnormal posture: Secondary | ICD-10-CM | POA: Insufficient documentation

## 2012-07-06 DIAGNOSIS — IMO0001 Reserved for inherently not codable concepts without codable children: Secondary | ICD-10-CM | POA: Insufficient documentation

## 2012-07-09 ENCOUNTER — Ambulatory Visit: Payer: Medicare Other | Admitting: Physical Therapy

## 2012-07-11 ENCOUNTER — Ambulatory Visit: Payer: Medicare Other | Admitting: Physical Therapy

## 2012-07-16 ENCOUNTER — Ambulatory Visit: Payer: Medicare Other | Admitting: Physical Therapy

## 2012-07-18 ENCOUNTER — Ambulatory Visit: Payer: Medicare Other | Admitting: Physical Therapy

## 2012-07-19 DIAGNOSIS — R269 Unspecified abnormalities of gait and mobility: Secondary | ICD-10-CM | POA: Insufficient documentation

## 2012-07-19 DIAGNOSIS — Z8669 Personal history of other diseases of the nervous system and sense organs: Secondary | ICD-10-CM | POA: Insufficient documentation

## 2012-07-19 DIAGNOSIS — Z5181 Encounter for therapeutic drug level monitoring: Secondary | ICD-10-CM | POA: Insufficient documentation

## 2012-07-19 DIAGNOSIS — I629 Nontraumatic intracranial hemorrhage, unspecified: Secondary | ICD-10-CM | POA: Insufficient documentation

## 2012-08-02 ENCOUNTER — Other Ambulatory Visit: Payer: Self-pay | Admitting: *Deleted

## 2012-08-02 MED ORDER — MESALAMINE 1.2 G PO TBEC: 2.4000 g | DELAYED_RELEASE_TABLET | Freq: Two times a day (BID) | ORAL | Status: DC

## 2012-08-08 ENCOUNTER — Other Ambulatory Visit: Payer: Self-pay | Admitting: *Deleted

## 2012-08-08 MED ORDER — MESALAMINE 1.2 G PO TBEC: 2.4000 g | DELAYED_RELEASE_TABLET | Freq: Two times a day (BID) | ORAL | Status: DC

## 2012-08-08 NOTE — Telephone Encounter (Signed)
Ok to refill 

## 2012-09-11 ENCOUNTER — Other Ambulatory Visit: Payer: Self-pay

## 2012-09-11 ENCOUNTER — Telehealth: Payer: Self-pay

## 2012-09-11 MED ORDER — PRAVASTATIN SODIUM 40 MG PO TABS: 40.0000 mg | ORAL_TABLET | Freq: Every evening | ORAL | Status: DC

## 2012-09-11 MED ORDER — LEVOTHYROXINE SODIUM 50 MCG PO TABS: 50.0000 ug | ORAL_TABLET | Freq: Every day | ORAL | Status: DC

## 2012-09-11 NOTE — Telephone Encounter (Signed)
Pt's spouse called requesting a return call to discuss billing question from hospitalization in 03/2012

## 2012-09-21 ENCOUNTER — Telehealth: Payer: Self-pay | Admitting: *Deleted

## 2012-09-21 NOTE — Telephone Encounter (Signed)
Husband calling requesting a call back re: patient's recent hospital stay.

## 2012-10-02 ENCOUNTER — Inpatient Hospital Stay (HOSPITAL_COMMUNITY)
Admission: EM | Admit: 2012-10-02 | Discharge: 2012-10-05 | DRG: 563 | Disposition: A | Discharge status: Skilled Nursing Facility | Payer: Medicare Other | Attending: Internal Medicine | Admitting: Internal Medicine

## 2012-10-02 ENCOUNTER — Emergency Department (HOSPITAL_COMMUNITY): Payer: Medicare Other

## 2012-10-02 ENCOUNTER — Encounter (HOSPITAL_COMMUNITY): Payer: Self-pay

## 2012-10-02 DIAGNOSIS — E039 Hypothyroidism, unspecified: Secondary | ICD-10-CM

## 2012-10-02 DIAGNOSIS — W19XXXA Unspecified fall, initial encounter: Secondary | ICD-10-CM

## 2012-10-02 DIAGNOSIS — I739 Peripheral vascular disease, unspecified: Secondary | ICD-10-CM | POA: Diagnosis present

## 2012-10-02 DIAGNOSIS — Z8673 Personal history of transient ischemic attack (TIA), and cerebral infarction without residual deficits: Secondary | ICD-10-CM

## 2012-10-02 DIAGNOSIS — G8190 Hemiplegia, unspecified affecting unspecified side: Secondary | ICD-10-CM

## 2012-10-02 DIAGNOSIS — S3282XA Multiple fractures of pelvis without disruption of pelvic ring, initial encounter for closed fracture: Secondary | ICD-10-CM

## 2012-10-02 DIAGNOSIS — I10 Essential (primary) hypertension: Secondary | ICD-10-CM

## 2012-10-02 DIAGNOSIS — Z8521 Personal history of malignant neoplasm of larynx: Secondary | ICD-10-CM

## 2012-10-02 DIAGNOSIS — S72009A Fracture of unspecified part of neck of unspecified femur, initial encounter for closed fracture: Secondary | ICD-10-CM

## 2012-10-02 DIAGNOSIS — S42301A Unspecified fracture of shaft of humerus, right arm, initial encounter for closed fracture: Secondary | ICD-10-CM

## 2012-10-02 DIAGNOSIS — A498 Other bacterial infections of unspecified site: Secondary | ICD-10-CM | POA: Diagnosis present

## 2012-10-02 DIAGNOSIS — S329XXD Fracture of unspecified parts of lumbosacral spine and pelvis, subsequent encounter for fracture with routine healing: Secondary | ICD-10-CM

## 2012-10-02 DIAGNOSIS — Y9301 Activity, walking, marching and hiking: Secondary | ICD-10-CM

## 2012-10-02 DIAGNOSIS — Y9229 Other specified public building as the place of occurrence of the external cause: Secondary | ICD-10-CM

## 2012-10-02 DIAGNOSIS — S42301D Unspecified fracture of shaft of humerus, right arm, subsequent encounter for fracture with routine healing: Secondary | ICD-10-CM

## 2012-10-02 DIAGNOSIS — C060 Malignant neoplasm of cheek mucosa: Secondary | ICD-10-CM | POA: Diagnosis present

## 2012-10-02 DIAGNOSIS — N390 Urinary tract infection, site not specified: Secondary | ICD-10-CM

## 2012-10-02 DIAGNOSIS — K5190 Ulcerative colitis, unspecified, without complications: Secondary | ICD-10-CM

## 2012-10-02 DIAGNOSIS — F0390 Unspecified dementia without behavioral disturbance: Secondary | ICD-10-CM | POA: Diagnosis present

## 2012-10-02 DIAGNOSIS — Z79899 Other long term (current) drug therapy: Secondary | ICD-10-CM

## 2012-10-02 DIAGNOSIS — E785 Hyperlipidemia, unspecified: Secondary | ICD-10-CM

## 2012-10-02 DIAGNOSIS — S42293A Other displaced fracture of upper end of unspecified humerus, initial encounter for closed fracture: Principal | ICD-10-CM | POA: Diagnosis present

## 2012-10-02 DIAGNOSIS — R569 Unspecified convulsions: Secondary | ICD-10-CM

## 2012-10-02 DIAGNOSIS — S72001A Fracture of unspecified part of neck of right femur, initial encounter for closed fracture: Secondary | ICD-10-CM

## 2012-10-02 DIAGNOSIS — C329 Malignant neoplasm of larynx, unspecified: Secondary | ICD-10-CM

## 2012-10-02 DIAGNOSIS — Z853 Personal history of malignant neoplasm of breast: Secondary | ICD-10-CM

## 2012-10-02 DIAGNOSIS — Z87891 Personal history of nicotine dependence: Secondary | ICD-10-CM

## 2012-10-02 DIAGNOSIS — G40909 Epilepsy, unspecified, not intractable, without status epilepticus: Secondary | ICD-10-CM | POA: Diagnosis present

## 2012-10-02 DIAGNOSIS — Z87440 Personal history of urinary (tract) infections: Secondary | ICD-10-CM

## 2012-10-02 DIAGNOSIS — S72001D Fracture of unspecified part of neck of right femur, subsequent encounter for closed fracture with routine healing: Secondary | ICD-10-CM

## 2012-10-02 DIAGNOSIS — S32509A Unspecified fracture of unspecified pubis, initial encounter for closed fracture: Secondary | ICD-10-CM | POA: Diagnosis present

## 2012-10-02 DIAGNOSIS — Z7982 Long term (current) use of aspirin: Secondary | ICD-10-CM

## 2012-10-02 HISTORY — DX: Personal history of urinary (tract) infections: Z87.440

## 2012-10-02 LAB — URINALYSIS, ROUTINE W REFLEX MICROSCOPIC
Glucose, UA: NEGATIVE mg/dL
Ketones, ur: NEGATIVE mg/dL
Nitrite: POSITIVE — AB
Protein, ur: 100 mg/dL — AB
pH: 7.5 (ref 5.0–8.0)

## 2012-10-02 LAB — CBC WITH DIFFERENTIAL/PLATELET
Basophils Absolute: 0 10*3/uL (ref 0.0–0.1)
Basophils Relative: 0 % (ref 0–1)
Eosinophils Absolute: 0 10*3/uL (ref 0.0–0.7)
Eosinophils Relative: 0 % (ref 0–5)
HCT: 37.9 % (ref 36.0–46.0)
Hemoglobin: 13.8 g/dL (ref 12.0–15.0)
MCH: 32.7 pg (ref 26.0–34.0)
MCHC: 36.4 g/dL — ABNORMAL HIGH (ref 30.0–36.0)
MCV: 89.8 fL (ref 78.0–100.0)
Monocytes Absolute: 1 10*3/uL (ref 0.1–1.0)
Monocytes Relative: 6 % (ref 3–12)
RDW: 13.6 % (ref 11.5–15.5)

## 2012-10-02 LAB — POCT I-STAT TROPONIN I: Troponin i, poc: 0 ng/mL (ref 0.00–0.08)

## 2012-10-02 LAB — COMPREHENSIVE METABOLIC PANEL
AST: 18 U/L (ref 0–37)
Albumin: 3.9 g/dL (ref 3.5–5.2)
BUN: 12 mg/dL (ref 6–23)
Calcium: 9.5 mg/dL (ref 8.4–10.5)
Chloride: 95 mEq/L — ABNORMAL LOW (ref 96–112)
Creatinine, Ser: 0.72 mg/dL (ref 0.50–1.10)
GFR calc non Af Amer: 83 mL/min — ABNORMAL LOW (ref >90)
Total Bilirubin: 0.6 mg/dL (ref 0.3–1.2)

## 2012-10-02 LAB — PHENYTOIN LEVEL, TOTAL: Phenytoin Lvl: <2.5 ug/mL — ABNORMAL LOW (ref 10.0–20.0)

## 2012-10-02 LAB — URINE MICROSCOPIC-ADD ON

## 2012-10-02 MED ORDER — DEXTROSE 5 % IV SOLN
1.0000 g | INTRAVENOUS | Status: DC
  Administered 2012-10-03 – 2012-10-04 (×3): 1 g via INTRAVENOUS
  Filled 2012-10-02 (×5): qty 10

## 2012-10-02 MED ORDER — SIMVASTATIN 20 MG PO TABS
20.0000 mg | ORAL_TABLET | Freq: Every day | ORAL | Status: DC
  Filled 2012-10-02: qty 1

## 2012-10-02 MED ORDER — PHENOBARBITAL 97.2 MG PO TABS
97.2000 mg | ORAL_TABLET | Freq: Every day | ORAL | Status: DC
  Administered 2012-10-03 – 2012-10-05 (×3): 97.2 mg via ORAL
  Filled 2012-10-02 (×3): qty 1

## 2012-10-02 MED ORDER — FOLIC ACID 1 MG PO TABS
1.0000 mg | ORAL_TABLET | Freq: Every day | ORAL | Status: DC
  Administered 2012-10-03 – 2012-10-05 (×3): 1 mg via ORAL
  Filled 2012-10-02 (×4): qty 1

## 2012-10-02 MED ORDER — DOCUSATE SODIUM 100 MG PO CAPS
100.0000 mg | ORAL_CAPSULE | Freq: Two times a day (BID) | ORAL | Status: DC
  Administered 2012-10-02 – 2012-10-05 (×5): 100 mg via ORAL
  Filled 2012-10-02 (×7): qty 1

## 2012-10-02 MED ORDER — DILTIAZEM HCL ER COATED BEADS 180 MG PO CP24
180.0000 mg | ORAL_CAPSULE | Freq: Every day | ORAL | Status: DC
  Administered 2012-10-04 – 2012-10-05 (×2): 180 mg via ORAL
  Filled 2012-10-02 (×3): qty 1

## 2012-10-02 MED ORDER — ENOXAPARIN SODIUM 40 MG/0.4ML ~~LOC~~ SOLN
40.0000 mg | SUBCUTANEOUS | Status: DC
  Administered 2012-10-02 – 2012-10-04 (×3): 40 mg via SUBCUTANEOUS
  Filled 2012-10-02 (×4): qty 0.4

## 2012-10-02 MED ORDER — VITAMIN C 500 MG PO TABS
500.0000 mg | ORAL_TABLET | Freq: Every day | ORAL | Status: DC
  Administered 2012-10-03 – 2012-10-05 (×3): 500 mg via ORAL
  Filled 2012-10-02 (×4): qty 1

## 2012-10-02 MED ORDER — HYDROMORPHONE HCL PF 1 MG/ML IJ SOLN
1.0000 mg | INTRAMUSCULAR | Status: DC | PRN
  Administered 2012-10-03 – 2012-10-05 (×11): 1 mg via INTRAVENOUS
  Filled 2012-10-02 (×11): qty 1

## 2012-10-02 MED ORDER — IBUPROFEN 600 MG PO TABS: 600.0000 mg | ORAL_TABLET | Freq: Four times a day (QID) | ORAL | Status: DC | PRN

## 2012-10-02 MED ORDER — ENSURE PLUS PO LIQD
237.0000 mL | Freq: Three times a day (TID) | ORAL | Status: DC
  Administered 2012-10-03 – 2012-10-04 (×6): 237 mL via ORAL
  Filled 2012-10-02 (×11): qty 237

## 2012-10-02 MED ORDER — MESALAMINE 1.2 G PO TBEC
2.4000 g | DELAYED_RELEASE_TABLET | Freq: Two times a day (BID) | ORAL | Status: DC
  Administered 2012-10-02 – 2012-10-05 (×6): 2.4 g via ORAL
  Filled 2012-10-02 (×9): qty 2

## 2012-10-02 MED ORDER — LEVOTHYROXINE SODIUM 50 MCG PO TABS
50.0000 ug | ORAL_TABLET | Freq: Every day | ORAL | Status: DC
  Administered 2012-10-02 – 2012-10-04 (×3): 50 ug via ORAL
  Filled 2012-10-02 (×4): qty 1

## 2012-10-02 MED ORDER — ONDANSETRON HCL 4 MG/2ML IJ SOLN: 4.0000 mg | Freq: Four times a day (QID) | INTRAMUSCULAR | Status: DC | PRN

## 2012-10-02 MED ORDER — ONDANSETRON HCL 4 MG PO TABS: 4.0000 mg | ORAL_TABLET | Freq: Four times a day (QID) | ORAL | Status: DC | PRN

## 2012-10-02 MED ORDER — LEVETIRACETAM 500 MG PO TABS
1000.0000 mg | ORAL_TABLET | Freq: Two times a day (BID) | ORAL | Status: DC
  Administered 2012-10-02 – 2012-10-03 (×3): 1000 mg via ORAL
  Filled 2012-10-02 (×5): qty 2

## 2012-10-02 MED ORDER — DEXTROSE-NACL 5-0.9 % IV SOLN
INTRAVENOUS | Status: DC
  Administered 2012-10-03 – 2012-10-04 (×3): via INTRAVENOUS

## 2012-10-02 MED ORDER — HYDROCODONE-ACETAMINOPHEN 5-325 MG PO TABS
2.0000 | ORAL_TABLET | Freq: Once | ORAL | Status: AC
  Administered 2012-10-02: 2 via ORAL
  Filled 2012-10-02: qty 2

## 2012-10-02 MED ORDER — HYDROCODONE-ACETAMINOPHEN 5-325 MG PO TABS: 1.0000 | ORAL_TABLET | Freq: Four times a day (QID) | ORAL | Status: DC | PRN

## 2012-10-02 MED ORDER — ADULT MULTIVITAMIN W/MINERALS CH
1.0000 | ORAL_TABLET | Freq: Every day | ORAL | Status: DC
  Administered 2012-10-03 – 2012-10-05 (×3): 1 via ORAL
  Filled 2012-10-02 (×3): qty 1

## 2012-10-02 MED ORDER — FENTANYL CITRATE 0.05 MG/ML IJ SOLN
25.0000 ug | Freq: Once | INTRAMUSCULAR | Status: AC
  Administered 2012-10-02: 25 ug via INTRAVENOUS
  Filled 2012-10-02: qty 2

## 2012-10-02 MED ORDER — ASPIRIN 81 MG PO CHEW
81.0000 mg | CHEWABLE_TABLET | Freq: Every day | ORAL | Status: DC
  Administered 2012-10-03 – 2012-10-05 (×3): 81 mg via ORAL
  Filled 2012-10-02 (×4): qty 1

## 2012-10-02 MED ORDER — ONDANSETRON HCL 4 MG/2ML IJ SOLN
4.0000 mg | Freq: Once | INTRAMUSCULAR | Status: AC
  Administered 2012-10-02: 4 mg via INTRAVENOUS
  Filled 2012-10-02: qty 2

## 2012-10-02 NOTE — ED Provider Notes (Signed)
History     CSN: [id number]  Arrival date & time 10/02/12  1353   First MD Initiated Contact with Patient 10/02/12 1403      Chief Complaint  Patient presents with  . Fall    (Consider location/radiation/quality/duration/timing/severity/associated sxs/prior treatment) HPI [name] is a 72 y.o. female who presents to ED with complaint of fall. Pt was at "[employer]" with family when was walking and had a witnessed fall. Per family pt was able to get up, but then was sat down in a chair where she was unresponsive. Pt was "slumped over" and would not respond. This lasted about a minute and was followed by a time period of confusion. Pt was combative and was not cooperating at first. Pt came back to it after about 20 min. Pt currently denies any complaints other than right shoulder pain. 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 Pt also has hx of seizures, not sure if had one today. No tongue injury, no urinary incontinence. Pt in on keppra for her seizures. She has been taking all her medications as prescribed.  Past Medical History  Diagnosis Date  . Peripheral vascular disease   . Subdural hematoma   . Ataxia   . Mild dysplasia of cervix   . UTI (lower urinary tract infection)   . Urinary incontinence   . Seizures   . Cerebrovascular disease   . Personal history of colonic polyps     adenomatous 1997 & tubular adenoma and hyplastic  2008  . Hyperlipidemia   . H/O alcohol abuse   . Diverticulosis of colon (without mention of hemorrhage)   . Redundant colon   . Squamous cell carcinoma of mouth   . Stroke   . C. difficile colitis   . Dementia t  . Ulcerative colitis   . Adenocarcinoma, breast     bilateral  . Hypothyroidism     Past Surgical History  Procedure Laterality Date  . Subdural hematoma evacuation via craniotomy    . Oral surgery for squamous cell carcinoma of the mouth    . Multiple tooth extractions       due to oral cancer  . Cataract extraction, bilateral    . Breast lumpectomy      left breast with radiation therapy  . Carotid endarterectomy      left  . Mastectomy      Right, history with nodule dissection  . Orif hip fracture  Sept '12    Right hip: screw and plate repair.  . Larynx surgery  08/2010    [facility]    Family History  Problem Relation Age of Onset  . Coronary artery disease Mother   . Heart disease Mother   . Diabetes Sister   . Breast cancer Maternal Aunt   . Breast cancer Sister     History  Substance Use Topics  . Smoking status: Former Smoker -- 40 years    Quit date: 10/26/2004  . Smokeless tobacco: Never Used  . Alcohol Use: No     Comment: normally 1 glass wine after dinner - none x 2.5 months    OB History   Grav Para Term Preterm Abortions TAB SAB Ect Mult Living                  Review of Systems  Constitutional: Negative for fever and chills.  HENT: Negative for neck pain and neck stiffness.  Eyes: Negative for photophobia and visual disturbance.  Respiratory: Negative.   Cardiovascular: Negative.   Gastrointestinal: Negative.   Musculoskeletal: Positive for joint swelling and arthralgias.  Neurological: Positive for seizures. Negative for dizziness, weakness, light-headedness, numbness and headaches.  All other systems reviewed and are negative.    Allergies  Review of patient's allergies indicates no known allergies.  Home Medications   Current Outpatient Rx  Name  Route  Sig  Dispense  Refill  . aspirin 81 MG chewable tablet   Oral   Chew 81 mg by mouth daily.         . Calcium Carbonate-Vitamin D (CALCIUM 500 + D PO)   Oral   Take 1 tablet by mouth daily.         Marland Kitchen diltiazem (CARDIZEM CD) 180 MG 24 hr capsule   Oral   Take 1 capsule (180 mg total) by mouth daily.   90 capsule   3   . Ensure Plus (ENSURE PLUS) LIQD   Oral   Take 237 mLs by mouth 3 (three) times daily between meals.          . folic acid  (FOLVITE) 1 MG tablet   Oral   Take 1 tablet (1 mg total) by mouth daily.   90 tablet   1   . levETIRAcetam (KEPPRA) 1000 MG tablet   Oral   Take 1 tablet (1,000 mg total) by mouth every 12 (twelve) hours.   60 tablet   11   . levothyroxine (SYNTHROID, LEVOTHROID) 50 MCG tablet   Oral   Take 1 tablet (50 mcg total) by mouth daily.   90 tablet   0     PATIENT NEEDS TO HAVE A PHYSICAL EXAM BEFORE FURTH ...   . lidocaine (XYLOCAINE) 2 % solution   Oral   Take 20 mLs by mouth every 3 (three) hours as needed. Oral pain         . mesalamine (LIALDA) 1.2 G EC tablet   Oral   Take 2 tablets (2.4 g total) by mouth 2 (two) times daily.   120 tablet   0     Lot numbr: [id number]  Expires: 11/2013   . Multiple Vitamin (MULTIVITAMIN WITH MINERALS) TABS   Oral   Take 1 tablet by mouth daily.         Marland Kitchen PHENobarbital (LUMINAL) 97.2 MG tablet   Oral   Take 97.2 mg by mouth daily.         . pravastatin (PRAVACHOL) 40 MG tablet   Oral   Take 1 tablet (40 mg total) by mouth every evening.   90 tablet   0     PATIENT NEEDS TO HAVE A PHYSICAL EXAM BEFORE FURTH .Marland Kitchen.   . vitamin C (ASCORBIC ACID) 500 MG tablet   Oral   Take 500 mg by mouth daily.           BP 98/47  Pulse 83  Temp(Src) 98.6 F (37 C) (Oral)  Resp 18  SpO2 94%  Physical Exam  Nursing note and vitals reviewed. Constitutional: She is oriented to person, place, and time. She appears well-developed and well-nourished. No distress.  HENT:  Trach in placed, capped  Eyes: Conjunctivae and EOM are normal. Pupils are equal, round, and reactive to light.  Neck: Normal range of motion. Neck supple.  Tender over right trapezius muscle  Cardiovascular: Normal rate, regular rhythm and normal heart sounds.   Pulmonary/Chest: Effort normal and breath sounds normal.  No respiratory distress. She has no wheezes. She has no rales.  Abdominal: Soft. Bowel sounds are normal. She exhibits no distension. There is no  tenderness. There is no rebound.  Musculoskeletal: Normal range of motion. She exhibits no edema.  Right shoulder deformity. Swelling over right shoulder and upper arm. Full rom with no pain over right elbow. Pain with right shoulder any ROM. Distal radial pulses normal and equal bilaterally. Cap refill normal <2sec in distal fingers  Neurological: She is alert and oriented to person, place, and time.  Skin: Skin is warm and dry.  Psychiatric: She has a normal mood and affect. Her behavior is normal.    ED Course  Procedures (including critical care time)   Date: 10/02/2012  Rate: 80  Rhythm: atrial flutter  QRS Axis: normal  Intervals: normal  ST/T Wave abnormalities: normal  Conduction Disutrbances:none  Narrative Interpretation:   Old EKG Reviewed: changes noted   A-fib on prior  3:51 PM Pt with right shoulder fracture vs dislocation based on exam. Pain medications ordered. Question possible seizure. Will monitor. Labs and CT pending.    Results for orders placed during the hospital encounter of 10/02/12  CBC WITH DIFFERENTIAL      Result Value Range   WBC 17.4 (*) 4.0 - 10.5 K/uL   RBC 4.22  3.87 - 5.11 MIL/uL   Hemoglobin 13.8  12.0 - 15.0 g/dL   HCT 16.1  09.6 - 04.5 %   MCV 89.8  78.0 - 100.0 fL   MCH 32.7  26.0 - 34.0 pg   MCHC 36.4 (*) 30.0 - 36.0 g/dL   RDW 40.9  81.1 - 91.4 %   Platelets 140 (*) 150 - 400 K/uL   Neutrophils Relative 89 (*) 43 - 77 %   Neutro Abs 15.5 (*) 1.7 - 7.7 K/uL   Lymphocytes Relative 5 (*) 12 - 46 %   Lymphs Abs 0.9  0.7 - 4.0 K/uL   Monocytes Relative 6  3 - 12 %   Monocytes Absolute 1.0  0.1 - 1.0 K/uL   Eosinophils Relative 0  0 - 5 %   Eosinophils Absolute 0.0  0.0 - 0.7 K/uL   Basophils Relative 0  0 - 1 %   Basophils Absolute 0.0  0.0 - 0.1 K/uL  COMPREHENSIVE METABOLIC PANEL      Result Value Range   Sodium 132 (*) 135 - 145 mEq/L   Potassium 4.7  3.5 - 5.1 mEq/L   Chloride 95 (*) 96 - 112 mEq/L   CO2 30  19 - 32 mEq/L    Glucose, Bld 127 (*) 70 - 99 mg/dL   BUN 12  6 - 23 mg/dL   Creatinine, Ser 7.82  0.50 - 1.10 mg/dL   Calcium 9.5  8.4 - 95.6 mg/dL   Total Protein 7.3  6.0 - 8.3 g/dL   Albumin 3.9  3.5 - 5.2 g/dL   AST 18  0 - 37 U/L   ALT 16  0 - 35 U/L   Alkaline Phosphatase 124 (*) 39 - 117 U/L   Total Bilirubin 0.6  0.3 - 1.2 mg/dL   GFR calc non Af Amer 83 (*) >90 mL/min   GFR calc Af Amer >90  >90 mL/min  PHENYTOIN LEVEL, TOTAL      Result Value Range   Phenytoin Lvl <2.5 (*) 10.0 - 20.0 ug/mL  POCT I-STAT TROPONIN I      Result Value Range   Troponin i, poc  0.00  0.00 - 0.08 ng/mL   Comment 3            Dg Shoulder Right  10/02/2012  *RADIOLOGY REPORT*  Clinical Data: Fall.  RIGHT SHOULDER - 2+ VIEW  Comparison: None.  Findings: Comminuted fracture of the proximal right humerus involving the surgical neck and the greater tuberosity with slight separation of fracture fragments.  The humeral head fracture fragment remains aligned with the glenoid.  Prior axillary lymph node dissection.  IMPRESSION: Comminuted fracture proximal right humerus as noted above.   Original Report Authenticated By: Lacy Duverney, M.D.    Ct Head Wo Contrast  10/02/2012  *RADIOLOGY REPORT*  Clinical Data:  Fall, unresponsive, dementia  CT HEAD WITHOUT CONTRAST CT CERVICAL SPINE WITHOUT CONTRAST  Technique:  Multidetector CT imaging of the head and cervical spine was performed following the standard protocol without intravenous contrast.  Multiplanar CT image reconstructions of the cervical spine were also generated.  Comparison:  MRI brain dated 03/09/2012  CT HEAD  Findings: No evidence of parenchymal hemorrhage or extra-axial fluid collection. No mass lesion, mass effect, or midline shift.  No CT evidence of acute infarction.  Stable chronic changes involving the left parieto-occipital region with encephalomalacic changes, ex vacuo dilatation of the left lateral ventricle, and overlying craniotomy.  Subcortical white matter and  periventricular small vessel ischemic changes.  Intracranial atherosclerosis.  Global cortical atrophy.  Stable ventricular prominence.  The visualized paranasal sinuses are essentially clear. The mastoid air cells are unopacified.  No evidence of calvarial fracture.  IMPRESSION: No evidence of acute intracranial abnormality.  Stable encephalomalacic/postsurgical changes on the left.  CT CERVICAL SPINE  Findings: Exaggerated cervical lordosis.  No evidence of fracture or dislocation.  Vertebral body heights are maintained.  The dens appears intact.  No prevertebral soft tissue swelling.  Moderate multilevel degenerative changes.  Surgical clips in the neck.  Prior tracheostomy.  Lung apices are notable for mild paraseptal emphysematous changes.  IMPRESSION:  No evidence of traumatic injury to the cervical spine.  Moderate multilevel degenerative changes.   Original Report Authenticated By: Charline Bills, M.D.    Ct Cervical Spine Wo Contrast  10/02/2012  *RADIOLOGY REPORT*  Clinical Data:  Fall, unresponsive, dementia  CT HEAD WITHOUT CONTRAST CT CERVICAL SPINE WITHOUT CONTRAST  Technique:  Multidetector CT imaging of the head and cervical spine was performed following the standard protocol without intravenous contrast.  Multiplanar CT image reconstructions of the cervical spine were also generated.  Comparison:  MRI brain dated 03/09/2012  CT HEAD  Findings: No evidence of parenchymal hemorrhage or extra-axial fluid collection. No mass lesion, mass effect, or midline shift.  No CT evidence of acute infarction.  Stable chronic changes involving the left parieto-occipital region with encephalomalacic changes, ex vacuo dilatation of the left lateral ventricle, and overlying craniotomy.  Subcortical white matter and periventricular small vessel ischemic changes.  Intracranial atherosclerosis.  Global cortical atrophy.  Stable ventricular prominence.  The visualized paranasal sinuses are essentially clear. The  mastoid air cells are unopacified.  No evidence of calvarial fracture.  IMPRESSION: No evidence of acute intracranial abnormality.  Stable encephalomalacic/postsurgical changes on the left.  CT CERVICAL SPINE  Findings: Exaggerated cervical lordosis.  No evidence of fracture or dislocation.  Vertebral body heights are maintained.  The dens appears intact.  No prevertebral soft tissue swelling.  Moderate multilevel degenerative changes.  Surgical clips in the neck.  Prior tracheostomy.  Lung apices are notable for mild paraseptal emphysematous changes.  IMPRESSION:  No evidence of traumatic injury to the cervical spine.  Moderate multilevel degenerative changes.   Original Report Authenticated By: Charline Bills, M.D.      1. Humerus fracture, right, closed, initial encounter   2. Seizure   3. Fall, initial encounter       MDM  Pt with with possible seizure after a fall/possible cause for her fall. She is on keppra. No triggers for her seizure. She is now back to baseline. Only complaint is pain to right shoulder. X-ray showed comminuted fracture of proximal right humerus. Discussed with Dr. Ophelia Charter, concerning the amount of swelling to the shoulder and development of compartment syndrome. Currently hand is warm, good cap refill to fingers and normal distal radial pulses. Per Dr. Ophelia Charter, place pt in immobilizer, ice, rest in recliner, follow up with him in the office.   Pt will be d/c home with pain medications and close follow up for both seizure and right arm fracture.   Filed Vitals:   10/02/12 1410 10/02/12 1412 10/02/12 1703  BP: 98/47  108/64  Pulse: 83  84  Temp: 98.6 F (37 C)    TempSrc: Oral    Resp: 18  18  SpO2:  94% 88%           [name] Vasudevan A Rozell Kettlewell, PA-C 10/02/12 1719

## 2012-10-02 NOTE — ED Notes (Signed)
Pt at [employer] with family.  Pt ambulating with cane and tripped over a cane falling on her right side.  Upon EMS arrival pt was unresponsive and slumped over a chair.  Radial and brachial pulses not palpated initially but pt had strong carotid pulse and become responsive in ambulance.   Pt aggressive initially trying to get off of stretcher. Pt has hx of dementia.  Pt had sitter who assisted.  Pt initially denied treatment but as she came around to her baseline allowed for EKG to be performed.  No neuro deficits noted with EMS.  Pt c/o tightness to left neck and right shoulder pain d/t fall. Pt has a trach d/t hx of mouth cancer x3, throat cancer x2, breast cancer bilaterally with right mastectomy.  Pt has a hx of seizures.  EKG showed a fib, a flutter.  CBG 98, VSS.

## 2012-10-02 NOTE — ED Notes (Signed)
Clinical Social Work Department BRIEF PSYCHOSOCIAL ASSESSMENT 10/02/2012  Patient:  [name], [name]     Account Number:  [id number]     Admit date:  10/02/2012  Clinical Social Worker:  Illene Silver  Date/Time:  10/02/2012 08:33 PM  Referred by:  Physician  Date Referred:  10/02/2012 Referred for  SNF Placement   Other Referral:   Interview type:  Family Other interview type:    PSYCHOSOCIAL DATA Living Status:  HUSBAND Admitted from facility:   Level of care:   Primary support name:  [name] Primary support relationship to patient:  SPOUSE Degree of support available:   Good.  Husband has been primary caregiver to pt for many years.  Pt does have a CNA who takes care of pt if her husband cannot be at home.  Husband states that he cannot care for pt with both a pelvic fx and arm fx.    CURRENT CONCERNS Current Concerns  Post-Acute Placement   Other Concerns:    SOCIAL WORK ASSESSMENT / PLAN CSW spoke with pt's husband re: D/c planning for pt.  Pt has dementia and was being care for by her RN during CSW visit.  Husband agreeable to [facility] and prefers [facility] as pt has been there before.   Assessment/plan status:  Psychosocial Support/Ongoing Assessment of Needs Other assessment/ plan:   Information/referral to community resources:    PATIENT'S/FAMILY'S RESPONSE TO PLAN OF CARE: Agreeable to [facility].  Aired his frustrations with the health care industry.  CSW offered emotional support.  Unit CSW to follow for placement.

## 2012-10-02 NOTE — ED Provider Notes (Signed)
Medical screening examination/treatment/procedure(s) were performed by non-physician practitioner and as supervising physician I was immediately available for consultation/collaboration.   Gavin Pound. Oletta Lamas, MD 10/02/12 2159

## 2012-10-02 NOTE — ED Notes (Signed)
Pt 87% on RA. Resp therapy paged. Pt responding, denies discomfort. NAD noted.

## 2012-10-02 NOTE — ED Notes (Signed)
When moving pt from bed to wheelchair she reported right hip pain. Unable to ambulate, or stand on leg. PA made aware. Orders to follow.

## 2012-10-02 NOTE — ED Notes (Signed)
Trach checked and clear. Pt sat upright. O2 improved to 91%. PA at bedside. Pt denies SOB, difficulty breathing.

## 2012-10-02 NOTE — ED Provider Notes (Signed)
This patient was being discharged, patient provided a new complaint of right hip pain. Will order CT exam of right hip, as patient unable to ambulate at this time.  Discussed with Dr. Manus Gunning, who agrees with the plan.  8:33 PM Results for orders placed during the hospital encounter of 10/02/12  CBC WITH DIFFERENTIAL      Result Value Range   WBC 17.4 (*) 4.0 - 10.5 K/uL   RBC 4.22  3.87 - 5.11 MIL/uL   Hemoglobin 13.8  12.0 - 15.0 g/dL   HCT 16.1  09.6 - 04.5 %   MCV 89.8  78.0 - 100.0 fL   MCH 32.7  26.0 - 34.0 pg   MCHC 36.4 (*) 30.0 - 36.0 g/dL   RDW 40.9  81.1 - 91.4 %   Platelets 140 (*) 150 - 400 K/uL   Neutrophils Relative 89 (*) 43 - 77 %   Neutro Abs 15.5 (*) 1.7 - 7.7 K/uL   Lymphocytes Relative 5 (*) 12 - 46 %   Lymphs Abs 0.9  0.7 - 4.0 K/uL   Monocytes Relative 6  3 - 12 %   Monocytes Absolute 1.0  0.1 - 1.0 K/uL   Eosinophils Relative 0  0 - 5 %   Eosinophils Absolute 0.0  0.0 - 0.7 K/uL   Basophils Relative 0  0 - 1 %   Basophils Absolute 0.0  0.0 - 0.1 K/uL  COMPREHENSIVE METABOLIC PANEL      Result Value Range   Sodium 132 (*) 135 - 145 mEq/L   Potassium 4.7  3.5 - 5.1 mEq/L   Chloride 95 (*) 96 - 112 mEq/L   CO2 30  19 - 32 mEq/L   Glucose, Bld 127 (*) 70 - 99 mg/dL   BUN 12  6 - 23 mg/dL   Creatinine, Ser 7.82  0.50 - 1.10 mg/dL   Calcium 9.5  8.4 - 95.6 mg/dL   Total Protein 7.3  6.0 - 8.3 g/dL   Albumin 3.9  3.5 - 5.2 g/dL   AST 18  0 - 37 U/L   ALT 16  0 - 35 U/L   Alkaline Phosphatase 124 (*) 39 - 117 U/L   Total Bilirubin 0.6  0.3 - 1.2 mg/dL   GFR calc non Af Amer 83 (*) >90 mL/min   GFR calc Af Amer >90  >90 mL/min  PHENYTOIN LEVEL, TOTAL      Result Value Range   Phenytoin Lvl <2.5 (*) 10.0 - 20.0 ug/mL  URINALYSIS, ROUTINE W REFLEX MICROSCOPIC      Result Value Range   Color, Urine YELLOW  YELLOW   APPearance CLOUDY (*) CLEAR   Specific Gravity, Urine 1.018  1.005 - 1.030   pH 7.5  5.0 - 8.0   Glucose, UA NEGATIVE  NEGATIVE mg/dL   Hgb  urine dipstick LARGE (*) NEGATIVE   Bilirubin Urine NEGATIVE  NEGATIVE   Ketones, ur NEGATIVE  NEGATIVE mg/dL   Protein, ur 213 (*) NEGATIVE mg/dL   Urobilinogen, UA 0.2  0.0 - 1.0 mg/dL   Nitrite POSITIVE (*) NEGATIVE   Leukocytes, UA LARGE (*) NEGATIVE  URINE MICROSCOPIC-ADD ON      Result Value Range   Squamous Epithelial / LPF FEW (*) RARE   WBC, UA TOO NUMEROUS TO COUNT  <3 WBC/hpf   RBC / HPF 21-50  <3 RBC/hpf   Bacteria, UA MANY (*) RARE  POCT I-STAT TROPONIN I      Result Value Range  Troponin i, poc 0.00  0.00 - 0.08 ng/mL   Comment 3            Dg Shoulder Right  10/02/2012  *RADIOLOGY REPORT*  Clinical Data: Fall.  RIGHT SHOULDER - 2+ VIEW  Comparison: None.  Findings: Comminuted fracture of the proximal right humerus involving the surgical neck and the greater tuberosity with slight separation of fracture fragments.  The humeral head fracture fragment remains aligned with the glenoid.  Prior axillary lymph node dissection.  IMPRESSION: Comminuted fracture proximal right humerus as noted above.   Original Report Authenticated By: Lacy Duverney, M.D.    Ct Head Wo Contrast  10/02/2012  *RADIOLOGY REPORT*  Clinical Data:  Fall, unresponsive, dementia  CT HEAD WITHOUT CONTRAST CT CERVICAL SPINE WITHOUT CONTRAST  Technique:  Multidetector CT imaging of the head and cervical spine was performed following the standard protocol without intravenous contrast.  Multiplanar CT image reconstructions of the cervical spine were also generated.  Comparison:  MRI brain dated 03/09/2012  CT HEAD  Findings: No evidence of parenchymal hemorrhage or extra-axial fluid collection. No mass lesion, mass effect, or midline shift.  No CT evidence of acute infarction.  Stable chronic changes involving the left parieto-occipital region with encephalomalacic changes, ex vacuo dilatation of the left lateral ventricle, and overlying craniotomy.  Subcortical white matter and periventricular small vessel ischemic  changes.  Intracranial atherosclerosis.  Global cortical atrophy.  Stable ventricular prominence.  The visualized paranasal sinuses are essentially clear. The mastoid air cells are unopacified.  No evidence of calvarial fracture.  IMPRESSION: No evidence of acute intracranial abnormality.  Stable encephalomalacic/postsurgical changes on the left.  CT CERVICAL SPINE  Findings: Exaggerated cervical lordosis.  No evidence of fracture or dislocation.  Vertebral body heights are maintained.  The dens appears intact.  No prevertebral soft tissue swelling.  Moderate multilevel degenerative changes.  Surgical clips in the neck.  Prior tracheostomy.  Lung apices are notable for mild paraseptal emphysematous changes.  IMPRESSION:  No evidence of traumatic injury to the cervical spine.  Moderate multilevel degenerative changes.   Original Report Authenticated By: Charline Bills, M.D.    Ct Cervical Spine Wo Contrast  10/02/2012  *RADIOLOGY REPORT*  Clinical Data:  Fall, unresponsive, dementia  CT HEAD WITHOUT CONTRAST CT CERVICAL SPINE WITHOUT CONTRAST  Technique:  Multidetector CT imaging of the head and cervical spine was performed following the standard protocol without intravenous contrast.  Multiplanar CT image reconstructions of the cervical spine were also generated.  Comparison:  MRI brain dated 03/09/2012  CT HEAD  Findings: No evidence of parenchymal hemorrhage or extra-axial fluid collection. No mass lesion, mass effect, or midline shift.  No CT evidence of acute infarction.  Stable chronic changes involving the left parieto-occipital region with encephalomalacic changes, ex vacuo dilatation of the left lateral ventricle, and overlying craniotomy.  Subcortical white matter and periventricular small vessel ischemic changes.  Intracranial atherosclerosis.  Global cortical atrophy.  Stable ventricular prominence.  The visualized paranasal sinuses are essentially clear. The mastoid air cells are unopacified.  No  evidence of calvarial fracture.  IMPRESSION: No evidence of acute intracranial abnormality.  Stable encephalomalacic/postsurgical changes on the left.  CT CERVICAL SPINE  Findings: Exaggerated cervical lordosis.  No evidence of fracture or dislocation.  Vertebral body heights are maintained.  The dens appears intact.  No prevertebral soft tissue swelling.  Moderate multilevel degenerative changes.  Surgical clips in the neck.  Prior tracheostomy.  Lung apices are notable for mild paraseptal emphysematous  changes.  IMPRESSION:  No evidence of traumatic injury to the cervical spine.  Moderate multilevel degenerative changes.   Original Report Authenticated By: Charline Bills, M.D.    Ct Hip Right Wo Contrast  10/02/2012  *RADIOLOGY REPORT*  Clinical Data: Right hip pain, fall  CT OF THE RIGHT HIP WITHOUT CONTRAST  Technique:  Multidetector CT imaging was performed according to the standard protocol. Multiplanar CT image reconstructions were also generated.  Comparison: None.  Findings: Status post ORIF of a prior right hip fracture.  No evidence of hardware complication.  No acute right hip fracture is seen.  Mildly comminuted fracture of the right superior pubic ramus (series 2/images 31 and 34).  Nondisplaced fracture of the right inferior pubic ramus (series 2/image 42).  Healing fracture of the left inferior pubic ramus (series 2/image 43).  Visualized soft tissue pelvis is notable for vascular calcifications and a thick-walled bladder.  IMPRESSION: Right superior/inferior pubic rami fractures, as described above.  Healing fracture of the left inferior pubic ramus.  Status post ORIF of a prior right hip fracture.   Original Report Authenticated By: Charline Bills, M.D.     8:33 PM Patient has pubic ramus fracture.  Will admit, as the patient cannot stand and husband cannot help with transfers.  Discussed with the hospitalist.  Roxy Horseman, PA-C 10/02/12 2034

## 2012-10-03 ENCOUNTER — Telehealth: Payer: Self-pay | Admitting: *Deleted

## 2012-10-03 DIAGNOSIS — I10 Essential (primary) hypertension: Secondary | ICD-10-CM

## 2012-10-03 DIAGNOSIS — K5190 Ulcerative colitis, unspecified, without complications: Secondary | ICD-10-CM

## 2012-10-03 DIAGNOSIS — S42309A Unspecified fracture of shaft of humerus, unspecified arm, initial encounter for closed fracture: Secondary | ICD-10-CM

## 2012-10-03 DIAGNOSIS — G8190 Hemiplegia, unspecified affecting unspecified side: Secondary | ICD-10-CM

## 2012-10-03 DIAGNOSIS — IMO0001 Reserved for inherently not codable concepts without codable children: Secondary | ICD-10-CM

## 2012-10-03 DIAGNOSIS — N390 Urinary tract infection, site not specified: Secondary | ICD-10-CM

## 2012-10-03 DIAGNOSIS — S42309D Unspecified fracture of shaft of humerus, unspecified arm, subsequent encounter for fracture with routine healing: Secondary | ICD-10-CM

## 2012-10-03 DIAGNOSIS — R569 Unspecified convulsions: Secondary | ICD-10-CM

## 2012-10-03 DIAGNOSIS — E039 Hypothyroidism, unspecified: Secondary | ICD-10-CM

## 2012-10-03 MED ORDER — SIMVASTATIN 40 MG PO TABS
40.0000 mg | ORAL_TABLET | Freq: Every day | ORAL | Status: DC
  Filled 2012-10-03: qty 1

## 2012-10-03 MED ORDER — LIDOCAINE VISCOUS 2 % MT SOLN
20.0000 mL | OROMUCOSAL | Status: DC | PRN
  Administered 2012-10-03 – 2012-10-04 (×2): 20 mL via OROMUCOSAL
  Filled 2012-10-03 (×2): qty 20

## 2012-10-03 MED ORDER — PRAVASTATIN SODIUM 40 MG PO TABS
40.0000 mg | ORAL_TABLET | Freq: Every day | ORAL | Status: DC
  Administered 2012-10-03 – 2012-10-04 (×2): 40 mg via ORAL
  Filled 2012-10-03 (×3): qty 1

## 2012-10-03 NOTE — H&P (Signed)
[facility] History and Physical  [name] ZOX:[id number] DOB: [birth year]    PCP:   Illene Regulus, MD   Chief Complaint: Seizure resulting in nonoperative right humerus fracture and right superior ramus fracture  HPI: [name] is an 72 y.o. female patient of Dr. Illene Regulus, with history of seizure on phenobarbital and Keppra, history of prior CVA, laryngeal cancer status post surgery, speaking through a voice box, hypothyroidism, dementia, prior alcohol abuse, was having loss of consciousness with post ictal confusion, suspicious for seizure, presents to the emergency room complaining of right arm pain and right hip pain. X-ray of the right arm show comminuted fracture of her right humerus. CT of the right hip showed superior ramus fracture as well. In the emergency room, she became more alert and was able to converse meaningfully. Because of the pain, however, she cannot be discharged home. Hospitalist was asked to admit her for pain control and physical therapy rehabilitation.  Dr. Ophelia Charter has been consulted and recommended followup in his office as these are not fractures requiring immediate surgery.  Rewiew of Systems:  Constitutional: Negative for malaise, fever and chills. No significant weight loss or weight gain Eyes: Negative for eye pain, redness and discharge, diplopia, visual changes, or flashes of light. ENMT: Negative for ear pain, hoarseness, nasal congestion, sinus pressure and sore throat. No headaches; tinnitus, drooling, or problem swallowing. Cardiovascular: Negative for chest pain, palpitations, diaphoresis, dyspnea and peripheral edema. ; No orthopnea, PND Respiratory: Negative for cough, hemoptysis, wheezing and stridor. No pleuritic chestpain. Gastrointestinal: Negative for nausea, vomiting, diarrhea, constipation, abdominal pain, melena, blood in stool, hematemesis, jaundice and rectal bleeding.    Genitourinary: Negative for frequency, dysuria,  incontinence,flank pain and hematuria; Musculoskeletal: Negative for back pain and neck pain. Negative for swelling and trauma.;  Skin: . Negative for pruritus, rash, abrasions, bruising and skin lesion.; ulcerations Neuro: Negative for headache, lightheadedness and neck stiffness. Negative for weakness, altered level of consciousness , altered mental status, extremity weakness, burning feet, involuntary movement, seizure and syncope.  Psych: negative for anxiety, depression, insomnia, tearfulness, panic attacks, hallucinations, paranoia, suicidal or homicidal ideation    Past Medical History  Diagnosis Date  . Peripheral vascular disease   . Subdural hematoma   . Ataxia   . Mild dysplasia of cervix   . UTI (lower urinary tract infection)   . Urinary incontinence   . Seizures   . Cerebrovascular disease   . Personal history of colonic polyps     adenomatous 1997 & tubular adenoma and hyplastic  2008  . Hyperlipidemia   . H/O alcohol abuse   . Diverticulosis of colon (without mention of hemorrhage)   . Redundant colon   . Squamous cell carcinoma of mouth   . Stroke   . C. difficile colitis   . Dementia t  . Ulcerative colitis   . Adenocarcinoma, breast     bilateral  . Hypothyroidism     Past Surgical History  Procedure Laterality Date  . Subdural hematoma evacuation via craniotomy    . Oral surgery for squamous cell carcinoma of the mouth    . Multiple tooth extractions      due to oral cancer  . Cataract extraction, bilateral    . Breast lumpectomy      left breast with radiation therapy  . Carotid endarterectomy      left  . Mastectomy      Right, history with nodule dissection  . Orif hip fracture  Sept '12    Right hip: screw and plate repair.  . Larynx surgery  08/2010    [facility]    Medications:  HOME MEDS: Prior to Admission medications   Medication Sig Start Date End Date Taking? Authorizing Provider  aspirin 81 MG chewable tablet Chew 81 mg by mouth  daily. 10/31/11 10/30/12 Yes Jacques Navy, MD  Calcium Carbonate-Vitamin D (CALCIUM 500 + D PO) Take 1 tablet by mouth daily.   Yes Historical Provider, MD  diltiazem (CARDIZEM CD) 180 MG 24 hr capsule Take 1 capsule (180 mg total) by mouth daily. 11/21/11  Yes Jacques Navy, MD  Ensure Plus (ENSURE PLUS) LIQD Take 237 mLs by mouth 3 (three) times daily between meals.    Yes Historical Provider, MD  folic acid (FOLVITE) 1 MG tablet Take 1 tablet (1 mg total) by mouth daily. 11/11/11  Yes Jacques Navy, MD  levETIRAcetam (KEPPRA) 1000 MG tablet Take 1 tablet (1,000 mg total) by mouth every 12 (twelve) hours. 03/29/12  Yes Jacques Navy, MD  levothyroxine (SYNTHROID, LEVOTHROID) 50 MCG tablet Take 1 tablet (50 mcg total) by mouth daily. 09/11/12  Yes Jacques Navy, MD  lidocaine (XYLOCAINE) 2 % solution Take 20 mLs by mouth every 3 (three) hours as needed. Oral pain   Yes Historical Provider, MD  mesalamine (LIALDA) 1.2 G EC tablet Take 2 tablets (2.4 g total) by mouth 2 (two) times daily. 08/08/12 08/08/13 Yes Jacques Navy, MD  Multiple Vitamin (MULTIVITAMIN WITH MINERALS) TABS Take 1 tablet by mouth daily.   Yes Historical Provider, MD  PHENobarbital (LUMINAL) 97.2 MG tablet Take 97.2 mg by mouth daily.   Yes Historical Provider, MD  pravastatin (PRAVACHOL) 40 MG tablet Take 1 tablet (40 mg total) by mouth every evening. 09/11/12  Yes Jacques Navy, MD  vitamin C (ASCORBIC ACID) 500 MG tablet Take 500 mg by mouth daily.   Yes Historical Provider, MD  HYDROcodone-acetaminophen (NORCO) 5-325 MG per tablet Take 1 tablet by mouth every 6 (six) hours as needed for pain. 10/02/12   Tatyana A Kirichenko, PA-C  ibuprofen (ADVIL,MOTRIN) 600 MG tablet Take 1 tablet (600 mg total) by mouth every 6 (six) hours as needed for pain. 10/02/12   Tatyana A Kirichenko, PA-C     Allergies:  No Known Allergies  Social History:   reports that she quit smoking about 7 years ago. She has never used smokeless  tobacco. She reports that she does not drink alcohol or use illicit drugs.  Family History: Family History  Problem Relation Age of Onset  . Coronary artery disease Mother   . Heart disease Mother   . Diabetes Sister   . Breast cancer Maternal Aunt   . Breast cancer Sister      Physical Exam: Filed Vitals:   10/02/12 1412 10/02/12 1703 10/02/12 2027 10/02/12 2212  BP:  108/64 92/71 102/61  Pulse:  84 96 96  Temp:    98.2 F (36.8 C)  TempSrc:      Resp:  18 18 18   SpO2: 94% 88% 94% 94%   Blood pressure 102/61, pulse 96, temperature 98.2 F (36.8 C), temperature source Oral, resp. rate 18, SpO2 94.00%.  GEN:  Pleasant  patient lying in the stretcher in no acute distress; cooperative with exam. PSYCH:  alert and oriented x4; does not appear anxious or depressed; affect is appropriate. HEENT: Mucous membranes pink and anicteric; PERRLA; EOM intact; no cervical lymphadenopathy nor thyromegaly or carotid  bruit; no JVD; There were no stridor. Neck is very supple. Breasts:: Not examined CHEST WALL: No tenderness CHEST: Normal respiration, clear to auscultation bilaterally.  HEART: Regular rate and rhythm.  There are no murmur, rub, or gallops.   BACK: No kyphosis or scoliosis; no CVA tenderness ABDOMEN: soft and non-tender; no masses, no organomegaly, normal abdominal bowel sounds; no pannus; no intertriginous candida. There is no rebound and no distention. Rectal Exam: Not done EXTREMITIES:  age-appropriate arthropathy of the hands and knees; no edema; no ulcerations.  There is no calf tenderness. Genitalia: not examined PULSES: 2+ and symmetric SKIN: Normal hydration no rash or ulceration CNS: Cranial nerves 2-12 grossly intact no focal lateralizing neurologic deficit. DTR are normal bilaterally, cerebella exam is intact, barbinski is negative and strengths are equaled bilaterally.  No sensory loss.   Labs on Admission:  Basic Metabolic Panel:  Recent Labs Lab 10/02/12 1518   NA 132*  K 4.7  CL 95*  CO2 30  GLUCOSE 127*  BUN 12  CREATININE 0.72  CALCIUM 9.5   Liver Function Tests:  Recent Labs Lab 10/02/12 1518  AST 18  ALT 16  ALKPHOS 124*  BILITOT 0.6  PROT 7.3  ALBUMIN 3.9   No results found for this basename: LIPASE, AMYLASE,  in the last 168 hours No results found for this basename: AMMONIA,  in the last 168 hours CBC:  Recent Labs Lab 10/02/12 1518  WBC 17.4*  NEUTROABS 15.5*  HGB 13.8  HCT 37.9  MCV 89.8  PLT 140*   Cardiac Enzymes: No results found for this basename: CKTOTAL, CKMB, CKMBINDEX, TROPONINI,  in the last 168 hours  CBG: No results found for this basename: GLUCAP,  in the last 168 hours   Radiological Exams on Admission: Dg Shoulder Right  10/02/2012  *RADIOLOGY REPORT*  Clinical Data: Fall.  RIGHT SHOULDER - 2+ VIEW  Comparison: None.  Findings: Comminuted fracture of the proximal right humerus involving the surgical neck and the greater tuberosity with slight separation of fracture fragments.  The humeral head fracture fragment remains aligned with the glenoid.  Prior axillary lymph node dissection.  IMPRESSION: Comminuted fracture proximal right humerus as noted above.   Original Report Authenticated By: Lacy Duverney, M.D.    Ct Head Wo Contrast  10/02/2012  *RADIOLOGY REPORT*  Clinical Data:  Fall, unresponsive, dementia  CT HEAD WITHOUT CONTRAST CT CERVICAL SPINE WITHOUT CONTRAST  Technique:  Multidetector CT imaging of the head and cervical spine was performed following the standard protocol without intravenous contrast.  Multiplanar CT image reconstructions of the cervical spine were also generated.  Comparison:  MRI brain dated 03/09/2012  CT HEAD  Findings: No evidence of parenchymal hemorrhage or extra-axial fluid collection. No mass lesion, mass effect, or midline shift.  No CT evidence of acute infarction.  Stable chronic changes involving the left parieto-occipital region with encephalomalacic changes, ex  vacuo dilatation of the left lateral ventricle, and overlying craniotomy.  Subcortical white matter and periventricular small vessel ischemic changes.  Intracranial atherosclerosis.  Global cortical atrophy.  Stable ventricular prominence.  The visualized paranasal sinuses are essentially clear. The mastoid air cells are unopacified.  No evidence of calvarial fracture.  IMPRESSION: No evidence of acute intracranial abnormality.  Stable encephalomalacic/postsurgical changes on the left.  CT CERVICAL SPINE  Findings: Exaggerated cervical lordosis.  No evidence of fracture or dislocation.  Vertebral body heights are maintained.  The dens appears intact.  No prevertebral soft tissue swelling.  Moderate multilevel degenerative  changes.  Surgical clips in the neck.  Prior tracheostomy.  Lung apices are notable for mild paraseptal emphysematous changes.  IMPRESSION:  No evidence of traumatic injury to the cervical spine.  Moderate multilevel degenerative changes.   Original Report Authenticated By: Charline Bills, M.D.    Ct Cervical Spine Wo Contrast  10/02/2012  *RADIOLOGY REPORT*  Clinical Data:  Fall, unresponsive, dementia  CT HEAD WITHOUT CONTRAST CT CERVICAL SPINE WITHOUT CONTRAST  Technique:  Multidetector CT imaging of the head and cervical spine was performed following the standard protocol without intravenous contrast.  Multiplanar CT image reconstructions of the cervical spine were also generated.  Comparison:  MRI brain dated 03/09/2012  CT HEAD  Findings: No evidence of parenchymal hemorrhage or extra-axial fluid collection. No mass lesion, mass effect, or midline shift.  No CT evidence of acute infarction.  Stable chronic changes involving the left parieto-occipital region with encephalomalacic changes, ex vacuo dilatation of the left lateral ventricle, and overlying craniotomy.  Subcortical white matter and periventricular small vessel ischemic changes.  Intracranial atherosclerosis.  Global cortical  atrophy.  Stable ventricular prominence.  The visualized paranasal sinuses are essentially clear. The mastoid air cells are unopacified.  No evidence of calvarial fracture.  IMPRESSION: No evidence of acute intracranial abnormality.  Stable encephalomalacic/postsurgical changes on the left.  CT CERVICAL SPINE  Findings: Exaggerated cervical lordosis.  No evidence of fracture or dislocation.  Vertebral body heights are maintained.  The dens appears intact.  No prevertebral soft tissue swelling.  Moderate multilevel degenerative changes.  Surgical clips in the neck.  Prior tracheostomy.  Lung apices are notable for mild paraseptal emphysematous changes.  IMPRESSION:  No evidence of traumatic injury to the cervical spine.  Moderate multilevel degenerative changes.   Original Report Authenticated By: Charline Bills, M.D.    Ct Hip Right Wo Contrast  10/02/2012  *RADIOLOGY REPORT*  Clinical Data: Right hip pain, fall  CT OF THE RIGHT HIP WITHOUT CONTRAST  Technique:  Multidetector CT imaging was performed according to the standard protocol. Multiplanar CT image reconstructions were also generated.  Comparison: None.  Findings: Status post ORIF of a prior right hip fracture.  No evidence of hardware complication.  No acute right hip fracture is seen.  Mildly comminuted fracture of the right superior pubic ramus (series 2/images 31 and 34).  Nondisplaced fracture of the right inferior pubic ramus (series 2/image 42).  Healing fracture of the left inferior pubic ramus (series 2/image 43).  Visualized soft tissue pelvis is notable for vascular calcifications and a thick-walled bladder.  IMPRESSION: Right superior/inferior pubic rami fractures, as described above.  Healing fracture of the left inferior pubic ramus.  Status post ORIF of a prior right hip fracture.   Original Report Authenticated By: Charline Bills, M.D.     Assessment/Plan Present on Admission:  . Seizure . Larynx cancer . UTI (urinary tract  infection) . HYPOTHYROIDISM . HYPERLIPIDEMIA . HYPERTENSION  PLAN:  Will admit her for pain control. PT and OT have been ordered to evaluate for possible short-term rehabilitation placement. She has no further seizure in the emergency room. I have reordered the phenobarbital level as Dilantin level was ordered emergency room. For her hypothyroidism, we'll continue her supplements and check TSH. She also was found to have a UTI and will be treated with Rocephin IV. The rest of her medication will be continued as well. As per prior arrangement, Dr. Illene Regulus will resume her care this morning. Thank you for allowing me to participate  in the care of your nice patient  Other plans as per orders.  Code Status: Full code   Rondrick Barreira, MD. [facility] Pager [phone] 7pm to 7am.  10/03/2012, 12:33 AM

## 2012-10-03 NOTE — ED Provider Notes (Signed)
Medical screening examination/treatment/procedure(s) were conducted as a shared visit with non-physician practitioner(s) and myself.  I personally evaluated the patient during the encounter   Glynn Octave, MD 10/03/12 [phone]

## 2012-10-03 NOTE — Progress Notes (Signed)
Subjective: [name] has a known seizure disorder and had a witnessed seizure in the ED. She had been brought in to ED after a fall She has a right humerus fracture and fracture of the right superior and inferior pubic ramus. She is admitted for pain control, PT/OT eval and planned disposition. She was also found to have a UTI and has been started on IV antibiotics -rocephin.  Does not seem to be in distress. She is requesting xylocain oral suspension.  Objective: Lab: Lab Results  Component Value Date   WBC 17.4* 10/02/2012   HGB 13.8 10/02/2012   HCT 37.9 10/02/2012   MCV 89.8 10/02/2012   PLT 140* 10/02/2012   BMET    Component Value Date/Time   NA 132* 10/02/2012 1518   K 4.7 10/02/2012 1518   CL 95* 10/02/2012 1518   CO2 30 10/02/2012 1518   GLUCOSE 127* 10/02/2012 1518   BUN 12 10/02/2012 1518   CREATININE 0.72 10/02/2012 1518   CALCIUM 9.5 10/02/2012 1518   GFRNONAA 83* 10/02/2012 1518   GFRAA >90 10/02/2012 1518  phenytoin sub-therapeutic at <2.5;    Imaging:  Scheduled Meds: . aspirin  81 mg Oral Daily  . cefTRIAXone (ROCEPHIN)  IV  1 g Intravenous Q24H  . diltiazem  180 mg Oral Daily  . docusate sodium  100 mg Oral BID  . enoxaparin (LOVENOX) injection  40 mg Subcutaneous Q24H  . Ensure Plus  237 mL Oral TID BM  . folic acid  1 mg Oral Daily  . levETIRAcetam  1,000 mg Oral Q12H  . levothyroxine  50 mcg Oral QAC breakfast  . mesalamine  2.4 g Oral BID  . multivitamin with minerals  1 tablet Oral Daily  . PHENobarbital  97.2 mg Oral Daily  . simvastatin  20 mg Oral q1800  . vitamin C  500 mg Oral Daily   Continuous Infusions: . dextrose 5 % and 0.9% NaCl 50 mL/hr at 10/03/12 0024   PRN Meds:.HYDROmorphone (DILAUDID) injection, ondansetron (ZOFRAN) IV, ondansetron   Physical Exam: Filed Vitals:   10/03/12 0655  BP: 92/54  Pulse: 83  Temp: 98.6  Resp: 18   Gen'l - chronically ill appearing white woman in no distress. Rigth arm in a sling with ice pack HEENT- very dry  mouth, C&S clear Neck - s/p radical neck dissection left. Well healed Cor- RRR Pulm - normal respirations Abd - soft. Neuro - awake and alert, able to talk with vocal cord device. MSK - arm in a sling.      Assessment/Plan: 1. Ortho - acute fractures right humerus and right pubic ramus. Plan For PT/OT eval to determine level of need: home vs SNF  Pain seems well controlled  2. Neuro - reviewed last neuro note from Jan '14: she is to be on Keppra 1, 000 mg bid and Phenobarbital Plan will continue home meds  3. ID - UTI, Day# 2 rocephin. Culture data pending Plan Continue rocephin  4. Lipids - will resume pravastatin (hospital substitution of Smivastatin is ok).    Illene Regulus [place] [facility] (o) [phone]; (c) [phone] Call-grp - [facility]  Tele: [phone]  10/03/2012, 7:54 AM

## 2012-10-03 NOTE — Progress Notes (Signed)
Utilization Review Completed.   Seabron Iannello, RN, BSN Nurse Case Manager  [phone]  

## 2012-10-03 NOTE — Telephone Encounter (Signed)
Patient's spouse called stating his wife is in the hospital due to a fall and the physician is thinking that his wife medications needs to adjusted. Patient's spouse would like to speak with physician.

## 2012-10-03 NOTE — Evaluation (Signed)
Occupational Therapy Evaluation Patient Details Name: [name] MRN: [id number] DOB: Apr 13, 1941 Today's Date: 10/03/2012 Time: 4098-1191 OT Time Calculation (min): 43 min  OT Assessment / Plan / Recommendation Clinical Impression   72 y.o. Female admitted after seizure resulting in fall. Pt. Sustained Rt. Humeral fx, as well as Rt. Superior and inferior pubic rami fxs.  Pt will benefit from OT for the below listed deficits,  to maximize safety and independence with BADLs to allow her to return home with family at min [place] after SNF level rehab.      OT Assessment  Patient needs continued OT Services    Follow Up Recommendations  SNF    Barriers to Discharge Decreased caregiver support    Equipment Recommendations  None recommended by OT    Recommendations for Other Services    Frequency  Min 2X/week    Precautions / Restrictions Precautions Precautions: Other (comment) Precaution Comments: No ROM Rt. shoulder Required Braces or Orthoses: Other Brace/Splint (sling) Other Brace/Splint: sling Restrictions Weight Bearing Restrictions: Yes RUE Weight Bearing: Non weight bearing       ADL  Eating/Feeding: Set up Where Assessed - Eating/Feeding: Bed level Grooming: Wash/dry hands;Wash/dry face;Teeth care;Brushing hair;Minimal assistance Where Assessed - Grooming: Supported sitting Upper Body Bathing: Moderate assistance Where Assessed - Upper Body Bathing: Supported sitting Lower Body Bathing: Maximal assistance Where Assessed - Lower Body Bathing: Rolling right and/or left Upper Body Dressing: Maximal assistance Where Assessed - Upper Body Dressing: Unsupported sitting Lower Body Dressing: +1 Total assistance Where Assessed - Lower Body Dressing: Rolling right and/or left Toilet Transfer: +2 Total assistance Toilet Transfer: Patient Percentage: 70% Toilet Transfer Method: Squat pivot Toilet Transfer Equipment: Other (comment) (recliner) Toileting - Clothing  Manipulation and Hygiene: +1 Total assistance Where Assessed - Toileting Clothing Manipulation and Hygiene: Sit to stand from 3-in-1 or toilet Equipment Used: Gait belt Transfers/Ambulation Related to ADLs: Pt performed squat pivot transfer to recliner with total ! +2 (pt ~70%) ADL Comments: Pt. unable to access feet for LB ADLs, unable to use Rt. UE    OT Diagnosis: Generalized weakness;Acute pain  OT Problem List: Decreased strength;Decreased activity tolerance;Decreased knowledge of use of DME or AE;Decreased knowledge of precautions;Pain;Impaired UE functional use OT Treatment Interventions: Self-care/ADL training;DME and/or AE instruction;Therapeutic activities;Patient/family education   OT Goals Acute Rehab OT Goals OT Goal Formulation: With patient Time For Goal Achievement: 10/10/12 Potential to Achieve Goals: Good ADL Goals Pt Will Perform Upper Body Bathing: with min assist;Sitting, chair ADL Goal: Upper Body Bathing - Progress: Goal set today Pt Will Perform Lower Body Bathing: with mod assist;Sit to stand from chair;Sit to stand from bed ADL Goal: Lower Body Bathing - Progress: Goal set today Pt Will Transfer to Toilet: with min assist;Stand pivot transfer;3-in-1 ADL Goal: Toilet Transfer - Progress: Goal set today  Visit Information  Last OT Received On: 10/03/12 Assistance Needed: +2    Subjective Data  Subjective: "Don't give up on me" Patient Stated Goal: To get better"   Prior Functioning     Home Living Lives With: Spouse Available Help at Discharge: Skilled Nursing Facility Type of Home: House Home Access: Stairs to enter Secretary/administrator of Steps: 3 Entrance Stairs-Rails: Left Home Layout: One level Bathroom Shower/Tub: Health visitor: Standard Home Adaptive Equipment: Bedside commode/3-in-1;Grab bars in shower;Straight cane Prior Function Level of Independence: Needs assistance Needs Assistance: Meal Prep;Light  Housekeeping;Gait Meal Prep: Moderate Light Housekeeping: Maximal Gait Assistance: Pt reports she had supervision Able to Take Stairs?:  Yes Driving: No Comments: Pt reports she had CNA PTA Communication Communication: Other (comment) (laryngectomy) Dominant Hand: Right         Vision/Perception     Cognition  Cognition Overall Cognitive Status: History of cognitive impairments - at baseline Arousal/Alertness: Awake/alert Orientation Level: Appears intact for tasks assessed Behavior During Session: [facility] for tasks performed Cognition - Other Comments: Pt with h/o dementia per chart, but no obvious deficits noted    Extremity/Trunk Assessment Right Upper Extremity Assessment RUE ROM/Strength/Tone: Deficits;Due to precautions RUE ROM/Strength/Tone Deficits: Rt. humeral fx RUE Coordination: Deficits Left Upper Extremity Assessment LUE ROM/Strength/Tone: Within functional levels LUE Coordination: WFL - gross/fine motor Trunk Assessment Trunk Assessment: Normal     Mobility Bed Mobility Bed Mobility: Supine to Sit;Sitting - Scoot to Edge of Bed Supine to Sit: 1: +2 Total assist;With rails;HOB flat Supine to Sit: Patient Percentage: 80% Details for Bed Mobility Assistance: assist to support Rt. LE for pain control Transfers Transfers: Sit to Stand;Stand to Sit Sit to Stand: 1: +2 Total assist;From bed;Without upper extremity assist Sit to Stand: Patient Percentage: 70% Details for Transfer Assistance: Pt performed squat pivot transfer to recliner.  Pt. moves slowly within confines of pain.  Pt. makes multiple attempts to move while minimizing pain.  Pt with c/o 10/10 pain with all attempts to move Rt. LE, or weigh shift     Exercise     Balance Balance Balance Assessed: Yes Static Sitting Balance Static Sitting - Balance Support: Right upper extremity supported Static Sitting - Level of Assistance: 5: Stand by assistance   End of Session OT - End of Session Equipment  Utilized During Treatment: Gait belt Activity Tolerance: Patient limited by pain Patient left: in chair;with call bell/phone within reach Nurse Communication: Mobility status;Patient requests pain meds  GO Functional Limitation: Self care Self Care Current Status [phone]): At least 80 percent but less than 100 percent impaired, limited or restricted Self Care Goal Status (U0454): At least 40 percent but less than 60 percent impaired, limited or restricted   [employer], OTR/L [phone]  Jeani Hawking M 10/03/2012, 3:22 PM

## 2012-10-03 NOTE — Evaluation (Signed)
Physical Therapy Evaluation Patient Details Name: [name] MRN: [id number] DOB: [birth year] Today's Date: 10/03/2012 Time: 5621-3086 PT Time Calculation (min): 43 min  PT Assessment / Plan / Recommendation Clinical Impression  Pt is a pleasant 72 y.o. female adm to [facility] secondary to fall resulting in R humerus fx and R superior and inferior pubic rami fx. Pt limited in therapy secondary to 10/10 pain with movement. Required 2+ for squat pivot transfers. Recommend SNF upon acute D/C for cont therapy and to reduce burden of care. Will cont to f/u with pt to increase mobility and strength.     PT Assessment  Patient needs continued PT services    Follow Up Recommendations  SNF    Does the patient have the potential to tolerate intense rehabilitation      Barriers to Discharge        Equipment Recommendations  None recommended by PT    Recommendations for Other Services     Frequency Min 3X/week    Precautions / Restrictions Precautions Precautions: Other (comment) Precaution Comments: No ROM Rt. shoulder Required Braces or Orthoses: Other Brace/Splint (sling) Other Brace/Splint: sling Restrictions Weight Bearing Restrictions: Yes RUE Weight Bearing: Non weight bearing Other Position/Activity Restrictions: no WB restrictions specified on R LE   Pertinent Vitals/Pain 10/10 with movement and activity in R Hip; RN notified and pt repositioned.      Mobility  Bed Mobility Bed Mobility: Supine to Sit;Sitting - Scoot to Edge of Bed Supine to Sit: 1: +2 Total assist;With rails;HOB flat Supine to Sit: Patient Percentage: 80% Details for Bed Mobility Assistance: assist to support Rt. LE for pain control Transfers Transfers: Squat Pivot Transfers Sit to Stand: 1: +2 Total assist;From bed;Without upper extremity assist Sit to Stand: Patient Percentage: 70% Squat Pivot Transfers: 1: +2 Total assist Squat Pivot Transfers: Patient Percentage: 60% Details for Transfer  Assistance: Pt able to attempt squat pivot transfer with 2+ A. Required multiple attempts secondary to increased pain with movement. Pt c/o 10/10 pain with movement; visually distraught with moving.  required continuous cues  for sequencing and hand placement.  Ambulation/Gait Ambulation/Gait Assistance: Not tested (comment) Stairs: No Wheelchair Mobility Wheelchair Mobility: No    Exercises     PT Diagnosis: Difficulty walking;Acute pain  PT Problem List: Decreased strength;Decreased range of motion;Decreased activity tolerance;Decreased balance;Decreased mobility;Decreased cognition;Decreased knowledge of use of DME;Decreased safety awareness;Decreased knowledge of precautions;Pain PT Treatment Interventions: DME instruction;Gait training;Functional mobility training;Therapeutic activities;Therapeutic exercise;Balance training;Neuromuscular re-education;Patient/family education;Cognitive remediation   PT Goals Acute Rehab PT Goals PT Goal Formulation: With patient Time For Goal Achievement: 10/10/12 Potential to Achieve Goals: Fair Pt will go Supine/Side to Sit: with modified independence PT Goal: Supine/Side to Sit - Progress: Goal set today Pt will Sit at Edge of Bed: with modified independence PT Goal: Sit at Edge Of Bed - Progress: Goal set today Pt will go Sit to Supine/Side: with modified independence PT Goal: Sit to Supine/Side - Progress: Goal set today Pt will go Sit to Stand: with supervision PT Goal: Sit to Stand - Progress: Goal set today Pt will go Stand to Sit: with supervision PT Goal: Stand to Sit - Progress: Goal set today Pt will Transfer Bed to Chair/Chair to Bed: with supervision PT Transfer Goal: Bed to Chair/Chair to Bed - Progress: Goal set today Pt will Ambulate: 51 - 150 feet;with least restrictive assistive device;with supervision PT Goal: Ambulate - Progress: Goal set today  Visit Information  Last PT Received On: 10/03/12 Assistance Needed: +2  PT/OT  Co-Evaluation/Treatment: Yes    Subjective Data  Subjective: I want to move Patient Stated Goal: to get better   Prior Functioning  Home Living Lives With: Spouse Available Help at Discharge: Skilled Nursing Facility Type of Home: House Home Access: Stairs to enter Secretary/administrator of Steps: 3 Entrance Stairs-Rails: Left Home Layout: One level Bathroom Shower/Tub: Health visitor: Standard Home Adaptive Equipment: Bedside commode/3-in-1;Grab bars in shower;Straight cane Prior Function Level of Independence: Needs assistance Needs Assistance: Meal Prep;Light Housekeeping;Gait Meal Prep: Moderate Light Housekeeping: Maximal Gait Assistance: Pt reports she had supervision Able to Take Stairs?: Yes Driving: No Comments: Pt reports she had CNA and husband to care for her prior to hospitalization Communication Communication: Other (comment) (laryngectomy) Dominant Hand: Right    Cognition  Cognition Overall Cognitive Status: History of cognitive impairments - at baseline Arousal/Alertness: Awake/alert Orientation Level: Appears intact for tasks assessed Behavior During Session: [facility] for tasks performed Cognition - Other Comments: Pt with h/o dementia per chart, but no obvious deficits noted    Extremity/Trunk Assessment Right Upper Extremity Assessment RUE ROM/Strength/Tone: Deficits;Due to precautions RUE ROM/Strength/Tone Deficits: Rt. humeral fx RUE Coordination: Deficits Left Upper Extremity Assessment LUE ROM/Strength/Tone: Within functional levels LUE Coordination: WFL - gross/fine motor Right Lower Extremity Assessment RLE ROM/Strength/Tone: Unable to fully assess;Due to pain RLE Sensation: WFL - Light Touch Left Lower Extremity Assessment LLE ROM/Strength/Tone: Within functional levels LLE Sensation: WFL - Light Touch Trunk Assessment Trunk Assessment: Normal   Balance Balance Balance Assessed: Yes Static Sitting Balance Static Sitting -  Balance Support: Right upper extremity supported Static Sitting - Level of Assistance: 5: Stand by assistance  End of Session PT - End of Session Equipment Utilized During Treatment: Gait belt Activity Tolerance: Patient limited by pain Patient left: in chair;with call bell/phone within reach Nurse Communication: Mobility status  GP Functional Assessment Tool Used: clinical judgement Functional Limitation: Mobility: Walking and moving around Mobility: Walking and Moving Around Current Status (Z6109): At least 60 percent but less than 80 percent impaired, limited or restricted Mobility: Walking and Moving Around Goal Status [phone]): At least 1 percent but less than 20 percent impaired, limited or restricted   Donell Sievert, [place] [phone] 10/03/2012, 3:57 PM

## 2012-10-03 NOTE — Telephone Encounter (Signed)
I called the husband. The patient apparently had a fall going into a store yesterday. The patient was not responding well following this, and was somewhat agitated after 15 or 20 minutes. There is a question about whether or not this was a seizure. The patient was not observed to have any jerking or twitching. The phenobarbital level in the hospital was 17. The patient is on 1000 mg twice daily of Keppra. CT scan of brain showed no acute changes, left brain encephalomalacia. The patient may require maximization of the Keppra dosing taking 1500 mg twice daily. The patient fractured her right pelvis, and her right humerus. The patient may require rehabilitation. The patient will be seen in revisit in 2 weeks.

## 2012-10-04 DIAGNOSIS — S72009D Fracture of unspecified part of neck of unspecified femur, subsequent encounter for closed fracture with routine healing: Secondary | ICD-10-CM

## 2012-10-04 LAB — URINE CULTURE: Colony Count: >=100000

## 2012-10-04 MED ORDER — LEVETIRACETAM 750 MG PO TABS
1500.0000 mg | ORAL_TABLET | Freq: Two times a day (BID) | ORAL | Status: DC
  Administered 2012-10-04 – 2012-10-05 (×3): 1500 mg via ORAL
  Filled 2012-10-04 (×4): qty 2

## 2012-10-04 NOTE — Clinical Social Work Placement (Addendum)
Clinical Social Work Department  CLINICAL SOCIAL WORK PLACEMENT NOTE  10/04/2012 Patient: [name]  Account Number: [id number] Admit date: 10/04/2011  Clinical Social Worker: Sabino Niemann MSW Date/time: 10/04/2012   Clinical Social Work is seeking post-discharge placement for this patient at the following level of care: SKILLED NURSING (*CSW will update this form in Epic as items are completed)  10/04/2012 Patient/family provided with [facility] Department of Clinical Social Work's list of facilities offering this level of care within the geographic area requested by the patient (or if unable, by the patient's family).  10/04/2012 Patient/family informed of their freedom to choose among providers that offer the needed level of care, that participate in Medicare, Medicaid or managed care program needed by the patient, have an available bed and are willing to accept the patient.  10/04/2012 Patient/family informed of [facility]' ownership interest in [facility], as well as of the fact that they are under no obligation to receive care at this facility.  PASARR submitted to EDS on 10/04/2012 PASARR number received from EDS on 10/04/2012 FL2 transmitted to all facilities in geographic area requested by pt/family on 10/04/2012 FL2 transmitted to all facilities within larger geographic area on  Patient informed that his/her managed care company has contracts with or will negotiate with certain facilities, including the following:  Patient/family informed of bed offers received: Patient chooses bed at [facility] Physician recommends and patient chooses bed at  Patient to be transferred to on 10/05/2012 Patient to be transferred to facility by [facility] The following physician request were entered in Epic:  Additional Comments:

## 2012-10-04 NOTE — Progress Notes (Signed)
Subjective: Appreciate PT/OT eval - recommendations for SNF noted. [name] is awake and alert. Seems comfortable. Is agreeable to SNF- prefers [facility]  Objective: Lab: Lab Results  Component Value Date   WBC 17.4* 10/02/2012   HGB 13.8 10/02/2012   HCT 37.9 10/02/2012   MCV 89.8 10/02/2012   PLT 140* 10/02/2012   BMET    Component Value Date/Time   NA 132* 10/02/2012 1518   K 4.7 10/02/2012 1518   CL 95* 10/02/2012 1518   CO2 30 10/02/2012 1518   GLUCOSE 127* 10/02/2012 1518   BUN 12 10/02/2012 1518   CREATININE 0.72 10/02/2012 1518   CALCIUM 9.5 10/02/2012 1518   GFRNONAA 83* 10/02/2012 1518   GFRAA >90 10/02/2012 1518     Imaging:  Scheduled Meds: . aspirin  81 mg Oral Daily  . cefTRIAXone (ROCEPHIN)  IV  1 g Intravenous Q24H  . diltiazem  180 mg Oral Daily  . docusate sodium  100 mg Oral BID  . enoxaparin (LOVENOX) injection  40 mg Subcutaneous Q24H  . Ensure Plus  237 mL Oral TID BM  . folic acid  1 mg Oral Daily  . levETIRAcetam  1,000 mg Oral Q12H  . levothyroxine  50 mcg Oral QAC breakfast  . mesalamine  2.4 g Oral BID  . multivitamin with minerals  1 tablet Oral Daily  . PHENobarbital  97.2 mg Oral Daily  . pravastatin  40 mg Oral q1800  . vitamin C  500 mg Oral Daily   Continuous Infusions: . dextrose 5 % and 0.9% NaCl 50 mL/hr at 10/03/12 2030   PRN Meds:.HYDROmorphone (DILAUDID) injection, lidocaine, ondansetron (ZOFRAN) IV, ondansetron   Physical Exam: Filed Vitals:   10/04/12 0531  BP: 145/57  Pulse: 99  Temp: 98.7 F (37.1 C)  Resp: 18    Intake/Output Summary (Last 24 hours) at 10/04/12 1610 Last data filed at 10/04/12 0330  Gross per 24 hour  Intake   1000 ml  Output    601 ml  Net    399 ml   Gen'l- chronically ill appearing white woman HEENT- [place]/AT Neck - post op changes left Cor- RRR Pulm - normal respirations Neuro - non-focal      Assessment/Plan: 1. Ortho - fracture pelvis, humerus right. Reviewed recommendations Plan For ST-SNF  2.  Neuro - phone note reviewed from K. Willis. Plan Increase Keppra to 1500 mg bid  3. ID - Day #3 Rocephin. Urine culture E. Coli, SS pending Plan Continue rocephin until SS back will then narrow coverage   [employer] IM (o) [phone]; (c) [phone] Call-grp - Patsi Sears IM  Tele: [phone]  10/04/2012, 6:22 AM

## 2012-10-04 NOTE — Progress Notes (Signed)
Physical Therapy Treatment Patient Details Name: LEAHANNA BUSER MRN: [id number] DOB: 1941-05-03 Today's Date: 10/04/2012 Time: 4098-1191 PT Time Calculation (min): 27 min  PT Assessment / Plan / Recommendation Comments on Treatment Session  Pt increased participation with therapy today; still limited by pain with activity in R hip; c/o 10/10 with movement. Pt required max A for stand pivot transfer to chair due to pain and decreased ability to weight shift. Pt is highly motivated to get better. Cont to recommend SNF upon acute D/C. Will cont to f/u with pt to increase mobility.     Follow Up Recommendations  SNF     Does the patient have the potential to tolerate intense rehabilitation     Barriers to Discharge        Equipment Recommendations  None recommended by PT    Recommendations for Other Services    Frequency Min 3X/week   Plan Discharge plan remains appropriate;Frequency remains appropriate    Precautions / Restrictions Precautions Precautions: Other (comment) Precaution Comments: No ROM Rt. shoulder Required Braces or Orthoses: Other Brace/Splint (sling) Other Brace/Splint: sling Restrictions Weight Bearing Restrictions: Yes RUE Weight Bearing: Non weight bearing Other Position/Activity Restrictions: no WB restrictions specified on R LE   Pertinent Vitals/Pain 10/10 with movement of R LE; pt premedicated and repositioned with ice packs for pain.     Mobility  Bed Mobility Bed Mobility: Supine to Sit;Sitting - Scoot to Edge of Bed Supine to Sit: 4: Min assist;With rails;HOB elevated Sitting - Scoot to [place] of Bed: 4: Min assist;With rail Details for Bed Mobility Assistance: min A to advance R LE to EOB; pt will direct PT to reduce pain.  Transfers Transfers: Stand Pivot Transfers;Stand to Sit;Sit to Stand Sit to Stand: From bed;With upper extremity assist;3: Mod assist Stand to Sit: With armrests;To chair/3-in-1;With upper extremity assist;3: Mod assist Stand  Pivot Transfers: 2: Max assist;From elevated surface Details for Transfer Assistance: Pt increased ability to participate; able to perform sit to stand with mod A requireing cues and encouragement; required max A for stand pivot; required facilitation and assitance to weightshift and vc's for hand placement and safety.  Ambulation/Gait Ambulation/Gait Assistance: Not tested (comment) Stairs: No Wheelchair Mobility Wheelchair Mobility: No    Exercises     PT Diagnosis:    PT Problem List:   PT Treatment Interventions:     PT Goals Acute Rehab PT Goals PT Goal Formulation: With patient Time For Goal Achievement: 10/10/12 Potential to Achieve Goals: Fair PT Goal: Supine/Side to Sit - Progress: Progressing toward goal PT Goal: Sit at Edge Of Bed - Progress: Progressing toward goal PT Goal: Sit to Stand - Progress: Progressing toward goal PT Goal: Stand to Sit - Progress: Progressing toward goal PT Transfer Goal: Bed to Chair/Chair to Bed - Progress: Progressing toward goal  Visit Information  Last PT Received On: 10/04/12 Assistance Needed: +2 (for safety if attempting amb)    Subjective Data  Subjective: I need to go to the bathroom. i am going to do what I have to do. Patient Stated Goal: to get better   Cognition  Cognition Overall Cognitive Status: History of cognitive impairments - at baseline Arousal/Alertness: Awake/alert Orientation Level: Appears intact for tasks assessed Behavior During Session: [facility] for tasks performed Cognition - Other Comments: no obvious deficits     Balance     End of Session PT - End of Session Equipment Utilized During Treatment: Gait belt Activity Tolerance: Patient limited by pain Patient left: in  chair;with call bell/phone within reach Nurse Communication: Mobility status   GP     Donell Sievert, [place] [phone] 10/04/2012, 2:33 PM

## 2012-10-05 DIAGNOSIS — E785 Hyperlipidemia, unspecified: Secondary | ICD-10-CM

## 2012-10-05 MED ORDER — ONDANSETRON HCL 4 MG PO TABS: 4.0000 mg | ORAL_TABLET | Freq: Four times a day (QID) | ORAL | Status: DC | PRN

## 2012-10-05 MED ORDER — CEFUROXIME AXETIL 250 MG PO TABS: 250.0000 mg | ORAL_TABLET | Freq: Two times a day (BID) | ORAL | Status: DC

## 2012-10-05 MED ORDER — LEVETIRACETAM 750 MG PO TABS: 1500.0000 mg | ORAL_TABLET | Freq: Two times a day (BID) | ORAL | Status: DC

## 2012-10-05 MED ORDER — CEFUROXIME AXETIL 250 MG PO TABS
250.0000 mg | ORAL_TABLET | Freq: Two times a day (BID) | ORAL | Status: DC
  Filled 2012-10-05 (×2): qty 1

## 2012-10-05 NOTE — Progress Notes (Signed)
Clinical social worker assisted with patient discharge to skilled nursing facility, [facility].  CSW addressed all family questions and concerns. CSW copied chart and added all important documents. CSW also set up patient transportation with [facility]. Clinical Social Worker will sign off for now as social work intervention is no longer needed.   Mert Dietrick, MSW, [phone] 

## 2012-10-05 NOTE — Progress Notes (Signed)
Subjective: [name] is awake and alert. She communicates that she is doing OK.  Objective: Lab: Lab Results  Component Value Date   WBC 17.4* 10/02/2012   HGB 13.8 10/02/2012   HCT 37.9 10/02/2012   MCV 89.8 10/02/2012   PLT 140* 10/02/2012   BMET    Component Value Date/Time   NA 132* 10/02/2012 1518   K 4.7 10/02/2012 1518   CL 95* 10/02/2012 1518   CO2 30 10/02/2012 1518   GLUCOSE 127* 10/02/2012 1518   BUN 12 10/02/2012 1518   CREATININE 0.72 10/02/2012 1518   CALCIUM 9.5 10/02/2012 1518   GFRNONAA 83* 10/02/2012 1518   GFRAA >90 10/02/2012 1518     Imaging:  Scheduled Meds: . aspirin  81 mg Oral Daily  . cefTRIAXone (ROCEPHIN)  IV  1 g Intravenous Q24H  . diltiazem  180 mg Oral Daily  . docusate sodium  100 mg Oral BID  . enoxaparin (LOVENOX) injection  40 mg Subcutaneous Q24H  . Ensure Plus  237 mL Oral TID BM  . folic acid  1 mg Oral Daily  . levETIRAcetam  1,500 mg Oral Q12H  . levothyroxine  50 mcg Oral QAC breakfast  . mesalamine  2.4 g Oral BID  . multivitamin with minerals  1 tablet Oral Daily  . PHENobarbital  97.2 mg Oral Daily  . pravastatin  40 mg Oral q1800  . vitamin C  500 mg Oral Daily   Continuous Infusions: . dextrose 5 % and 0.9% NaCl 50 mL/hr at 10/04/12 1815   PRN Meds:.HYDROmorphone (DILAUDID) injection, lidocaine, ondansetron (ZOFRAN) IV, ondansetron   Physical Exam: Filed Vitals:   10/05/12 0500  BP: 152/67  Pulse: 108  Temp: 98.7 F (37.1 C)  Resp: 16    Intake/Output Summary (Last 24 hours) at 10/05/12 1610 Last data filed at 10/04/12 1815  Gross per 24 hour  Intake  852.5 ml  Output      0 ml  Net  852.5 ml   Gen'l - pleasant older woman in no distress Cor- RRR Pulm - normal respirations Abd - soft Neuro - awake and at her baseline  See d/c summary      Assessment/Plan: 1. Ortho - stable fractures. Working with PT  2. Neuro - no report of further seizure activity.  3. ID- Day #4 Rocephin. SS - ceftriaxone and  quinolones. Plan Change to oral ceftin 250 mg bid.  Dispo - SNF when bed available. Fl-2 done. D/C summary done- dictated # [phone]   Illene Regulus [facility] (o) [phone]; (c) [phone] Call-grp - Patsi Sears IM  Tele: [phone]  10/05/2012, 8:21 AM

## 2012-10-05 NOTE — Progress Notes (Signed)
Physical Therapy Treatment Patient Details Name: [name] MRN: [id number] DOB: 03/08/1941 Today's Date: 10/05/2012 Time: 6387-5643 PT Time Calculation (min): 24 min  PT Assessment / Plan / Recommendation Comments on Treatment Session  Pt increased participation in therapy today and is highly motivated to get better and get moving. Pt required mod A for transfers and amb; pt is very implusive and demo decreased safety awareness and balance deficits. Pt plans to D/C to SNF for cont rehab; agree with D/C plan. Will cont to f/u with pt while in acute stay to increase mobility and overall strength.     Follow Up Recommendations  SNF     Does the patient have the potential to tolerate intense rehabilitation     Barriers to Discharge        Equipment Recommendations  None recommended by PT    Recommendations for Other Services    Frequency Min 3X/week   Plan Discharge plan remains appropriate;Frequency remains appropriate    Precautions / Restrictions Precautions Precautions: Other (comment) (layrengectomy) Required Braces or Orthoses: Other Brace/Splint Other Brace/Splint: sling Restrictions Weight Bearing Restrictions: Yes RUE Weight Bearing: Non weight bearing Other Position/Activity Restrictions: no WB restrictions specified on R LE   Pertinent Vitals/Pain C/o 7/10 pain with activity and 3/10 at rest; pt repositioned and premedicated. C/o "burning pain" in perineal area; SN notified.    Mobility  Bed Mobility Bed Mobility: Supine to Sit;Sitting - Scoot to Edge of Bed Supine to Sit: 4: Min guard;With rails Sitting - Scoot to Edge of Bed: 5: Supervision;With rail Details for Bed Mobility Assistance: min cues for hand placement and sequencing to avoid WB through R UE.  Required increased time secondary to increased pain.  Transfers Transfers: Sit to Stand;Stand to Sit Sit to Stand: From bed;From elevated surface;With upper extremity assist;3: Mod assist Stand to Sit: To  chair/3-in-1;With armrests;With upper extremity assist;3: Mod assist Details for Transfer Assistance: Pt sit to stand x 4; pt demo decrease safety awareness and is impulsive with transfers. Pt able to transfer with mod A requiring bracing and HHA on L UE. pt required vc's for hand placement and constant cues for safety.  Ambulation/Gait Ambulation/Gait Assistance: 3: Mod assist Ambulation Distance (Feet): 6 Feet Assistive device: 1 person hand held assist;Other (Comment) (attempted SPC per pt request; Unsafe and not steady with SPC) Ambulation/Gait Assistance Details: Pt able to amb 6 ft with mod A requires continuous cues for safety; pt very impusive; unable to weightshift completely on R LE required HHA and facilitation to step forward.  Gait Pattern: Step-to pattern;Decreased stance time - right;Decreased step length - left;Trunk flexed;Narrow base of support;Trunk rotated posteriorly on left;Lateral trunk lean to left;Antalgic Gait velocity: decreased Stairs: No Wheelchair Mobility Wheelchair Mobility: No    Exercises     PT Diagnosis:    PT Problem List:   PT Treatment Interventions:     PT Goals Acute Rehab PT Goals PT Goal Formulation: With patient Time For Goal Achievement: 10/10/12 Potential to Achieve Goals: Good PT Goal: Supine/Side to Sit - Progress: Progressing toward goal PT Goal: Sit at Edge Of Bed - Progress: Progressing toward goal PT Goal: Sit to Stand - Progress: Progressing toward goal PT Goal: Stand to Sit - Progress: Progressing toward goal PT Transfer Goal: Bed to Chair/Chair to Bed - Progress: Progressing toward goal PT Goal: Ambulate - Progress: Progressing toward goal  Visit Information  Last PT Received On: 10/05/12 Assistance Needed: +2 (for safety; to follow with chair )  Subjective Data  Subjective: I need to go to the bathroom and poopy. I am ready to get out of this place.  Patient Stated Goal: to get better   Cognition  Cognition Overall  Cognitive Status: History of cognitive impairments - at baseline Arousal/Alertness: Awake/alert Orientation Level: Appears intact for tasks assessed Behavior During Session: [facility] for tasks performed Cognition - Other Comments: no obvious deficits     Balance  Balance Balance Assessed: No  End of Session PT - End of Session Equipment Utilized During Treatment: Gait belt Activity Tolerance: Patient tolerated treatment well Patient left: in chair;with call bell/phone within reach Nurse Communication: Mobility status;Other (comment) (c/o "burning pain" in perineal area. SN notified.)   GP     Donell Sievert, [place] [phone] 10/05/2012, 11:42 AM

## 2012-10-05 NOTE — Discharge Summary (Signed)
[name], [name]            ACCOUNT NO.:  [id number]  MEDICAL RECORD NO.:  [id number]  LOCATION:  5N27C                        FACILITY:  [facility]  PHYSICIAN:  Rosalyn Gess. Norins, MD  DATE OF BIRTH:  04/24/1941  DATE OF ADMISSION:  10/02/2012 DATE OF DISCHARGE:                              DISCHARGE SUMMARY   ADMITTING DIAGNOSES: 1. Fracture of the right humerus     Fracture superior inferior ramus of the right pelvis. 2. Seizure disorder with a witnessed seizure in the emergency     department. 3. Positive urinalysis. 4. History of laryngeal cancer.  DISCHARGE DIAGNOSES: 1. Fracture of the right humerus      Fracture superior inferior ramus of the     right pelvis. 2. Seizure disorder with a witnessed seizure in the emergency     department. 3. Positive urinalysis. 4. History of laryngeal cancer.  CONSULTANTS:  None.  PROCEDURES: 1. CT of cervical spine, day of admission, which showed no evidence of     traumatic injury of the cervical spine.  Moderate multilevel     degenerative changes noted. 2. CT of the head without contrast, with no evidence of acute     intracranial abnormality.  Stable encephalomalacia/postsurgical     changes on the left.  3.  Diagnostic x-ray right shoulder, which     showed comminuted fracture proximal right humerus with slight     separation of fracture fragments.  Humeral head fracture fragment     remains aligned with the glenoid. 4. CT right hip without contrast reveals right superior inferior pubic     rami fracture.  Healing fracture of left inferior pubic ramus,     status post open reduction and internal fixation prior right hip     fracture.  HISTORY OF PRESENT ILLNESS:  The patient is a 72 year old woman who lives at home with her husband who provides attentive care.  She has a history of a seizure disorder, history of prior CVA, history of laryngeal cancer, status post radical neck dissection.  History of hypothyroid disease,  history of mild dementia, history of prior alcohol abuse.  The patient was noted to have had a seizure with loss of consciousness and postictal confusion.  She presented to the emergency department after this episode complaining of right arm pain and right hip pain.  Imaging studies revealed fracture of the right humerus and of the pubic ramus on the right.  During her ER stay, she became more awake and alert.  She was admitted for management of pain and disposition. The patient was seen in consultation by Dr. Ophelia Charter the emergency department, who recommended office followup with no immediate need for surgery.  Please see the H and P for past medical history, family history, and social history, as well as admission exam.  HOSPITAL COURSE: 1. Ortho, patient was evaluated by Dr. Ophelia Charter and did not require any     operative repair.  She has her arm in a sling.  The patient     has been seen by Physical therapy and Occupational therapy.     Recommended short-term skilled care while her fractures healed and     she is able to  recover mobility.  The patient has tolerated her     treatments well.  Her pain is well controlled.  She is medically     stable for transfer to skilled care to complete her rehabilitation     when bed available. 2. Neuro, patient with known longstanding seizure disorder.  There is     a documented phone conversation between the patient's family and     Dr. Mathews Robinsons and he has recommended increasing her Keppra from     1000 mg b.i.d. to 1500 mg b.i.d.  This change has been initiated.     The patient has had no additional seizures during her hospital     stay, and is stable for discharge on her current medications. 3. ID, patient was found to have a positive urinalysis.  Urine culture     was positive for E coli that was pansensitive including to     ceftriaxone.  Patient received 4 days of IV ceftriaxone and then     switched to p.o.  Rocephin to complete a full 10-day  course of     treatment. 4. Status post radical neck.  The patient has done well.  She is able     to swallow without difficulty.  She does use a artificial voice box     and she communicated well.  With the patient being medically stable, she is awaiting skilled     care placement.  PHYSICAL EXAMINATION:  VITAL SIGNS:  At the time of this dictation. Patient's temperature is 98.7, blood pressure 152/67, heart rate 108, pulse was 16, O2 sats 94% on room air. GENERAL APPEARANCE:  This is a chronically ill-appearing woman in no acute distress. HEENT:  Conjunctiva, sclerae were clear.  Pupils equal, round, and reactive.  Oropharynx, the patient is edentulous.  No oral lesions were noted. NECK:  Patient has had radical neck surgery on the left, as well it healed. CARDIOVASCULAR:  2+ radial pulse.  Patient's precordium was quiet.  She had a regular rate and rhythm. PULMONARY:  The patient has no increased work of breathing.  She has no wheezes, rales, or rhonchi. ABDOMEN:  Soft with positive bowel sounds.  No guarding or rebound is appreciated. GENITALIA/RECTAL:  Deferred. EXTREMITIES:  Patient's right arm is in a sling and was not taken out of the sling or moved.  The patient's left upper extremity is normal. right lower extremity do appear normal. DERM:  Patient has no evidence of skin breakdown on her shoulders, arms, or legs, and heels.  She was not rolled to check sacrum. NEURO:  Patient is awake, alert.  She is oriented she communicates well. She has had no further seizure disorder.  DISCHARGE MEDICATIONS:   Aspirin 81 mg daily,  calcium carbonate with D diltiazem CD 180 mg 1 capsule daily,  Ensure Plus liquid 237 mL can 3 times daily,  folic acid 1mg  daily,  Keppra 1500 mg b.i.d.,  levothyroxine 50 mcg daily,  Xylocaine 2% oral solution, 20 mL x mouth swish and spit every 3 hours as needed, mesalamine 1.2 g enteric coated tablets 2 tablets by mouth 2 times daily,   multivitamin tablets daily,  ondansetron 4 mg p.o. q.6 hours p.r.n. nausea,  phenobarbital 97.2 mg 1 tablet daily,  pravastatin 40 mg q.p.m.,  vitamin C 500 mg daily,  Ceftin 250 mg p.o. b.i.d. for 6 days for completion of treatment for UTI.  DISPOSITION:  Transferred to a skilled bed when available with a plan  to transition to home.  CONDITION:  The patient's condition at time of discharge dictation is medically stable and ready for transfer.  CODE STATUS:  Patient is a full code.     Rosalyn Gess Norins, MD     MEN/MEDQ  D:  10/05/2012  T:  10/05/2012  Job:  [id number]

## 2012-10-10 NOTE — Telephone Encounter (Signed)
Per Epic phone note 10/03/12, pt back in hospital and has spoken with neuro MD, closing phone note

## 2012-10-18 IMAGING — CR DG HIP 1V PORT*R*
1 series · 1 of 1 positions shown · non-contrast
Comparison: 3246 hours the same day and earlier.

CLINICAL DATA: 70-year-old female status post right hip repair.

PORTABLE RIGHT HIP - 1 VIEW

[view not recorded]
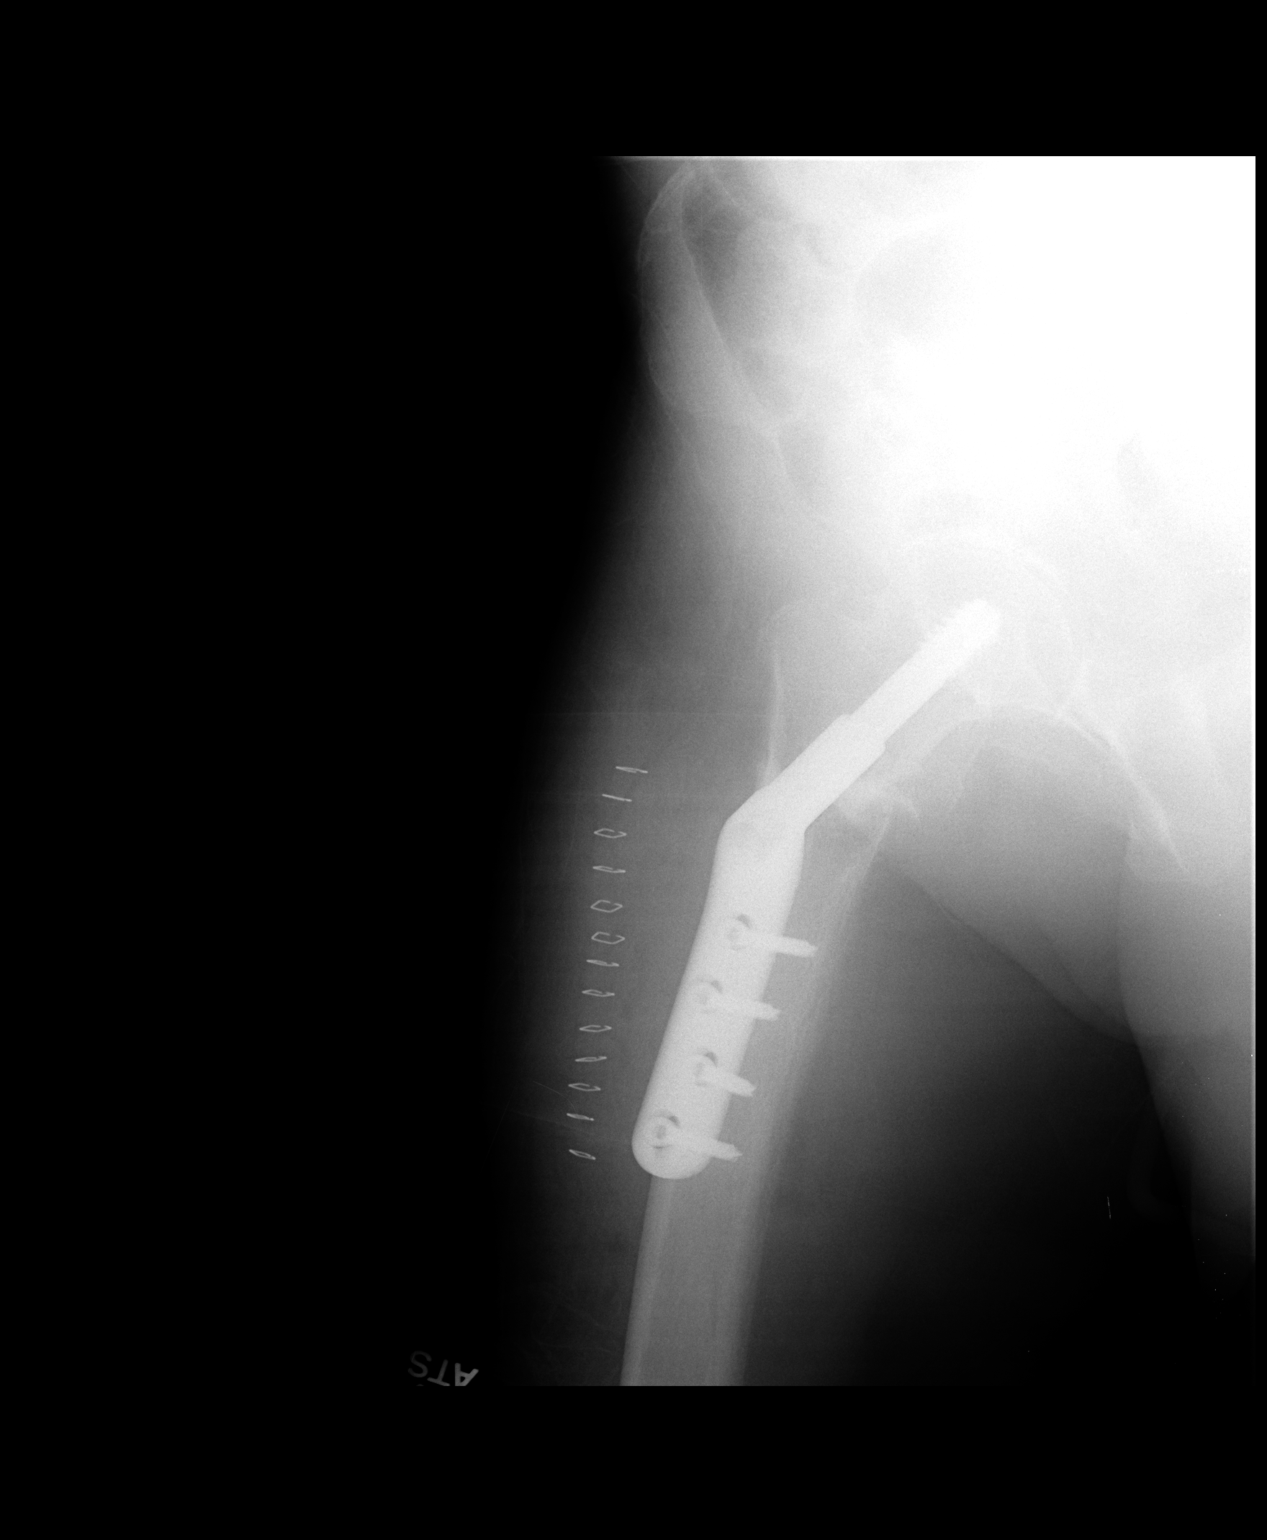

[1 of 1 positions shown; findings below may reference images not displayed]

FINDINGS: Portable view of the right hip approximate the frog-leg
lateral.  Proximal right femur hardware appears stable and within
normal limits.  Alignment about the intertrochanteric fracture
appears stable.  Overlying postoperative soft tissue changes.
IMPRESSION: No adverse features identified status post proximal right femur
ORIF.

## 2012-10-18 IMAGING — CR DG PORTABLE PELVIS
1 series · 1 of 1 positions shown · non-contrast
Comparison: 09/16/2010

CLINICAL DATA: Postoperative radiograph right hip repair.

PORTABLE PELVIS

[view not recorded]
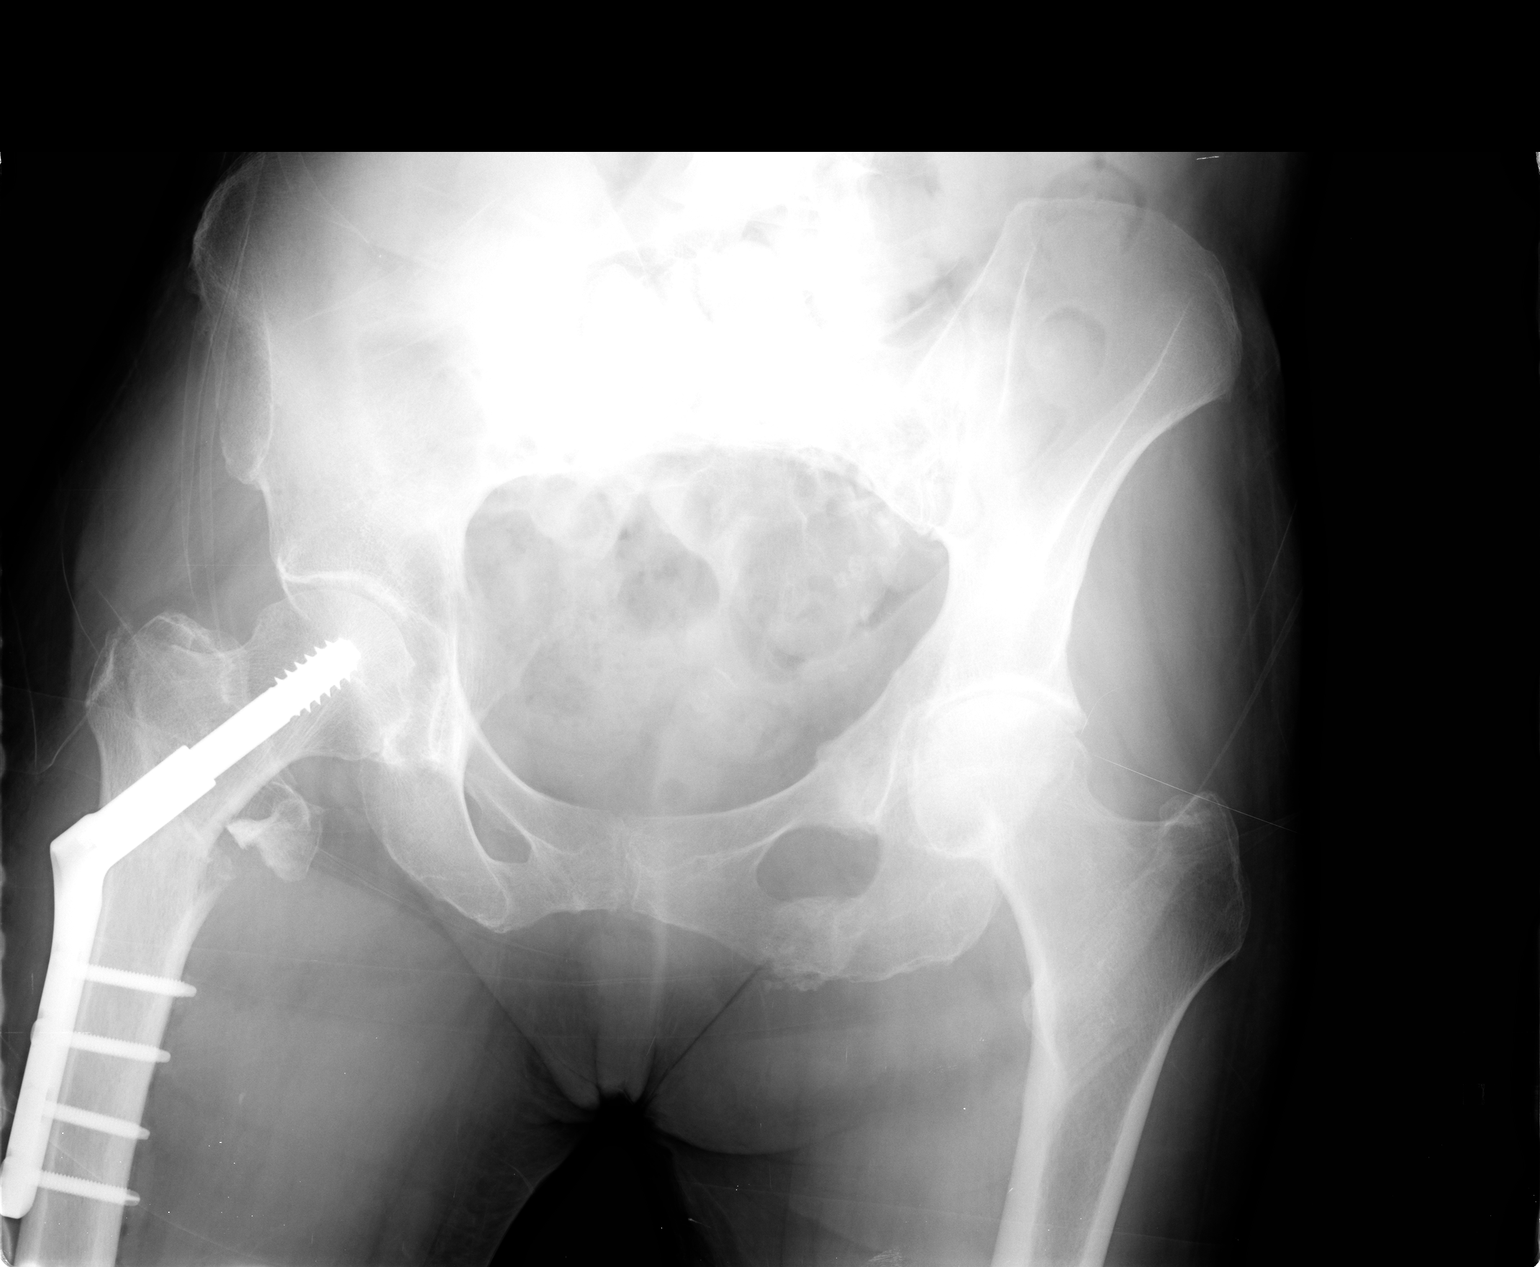

[1 of 1 positions shown; findings below may reference images not displayed]

FINDINGS: Post plate and screw fixation of a right
intertrochanteric fracture. No immediate evidence for hardware
complication.  Subacute to chronic fractures of the left superior
and inferior pubic rami.
IMPRESSION: Status post right hip ORIF  without immediate evidence for hardware
complication.

Subacute to chronic left superior and inferior pubic rami
fractures.

## 2012-10-18 IMAGING — RF DG HIP OPERATIVE*R*
1 series · 3 of 3 positions shown · non-contrast
Comparison: 03/15/2011.

Fluoroscopy time of 0.6 minutes was utilized.

CLINICAL DATA: 70-year-old female undergoing right hip ORIF.

OPERATIVE RIGHT HIP

[Series 1: run · 3 of 3 slices shown]
[im 1/3]
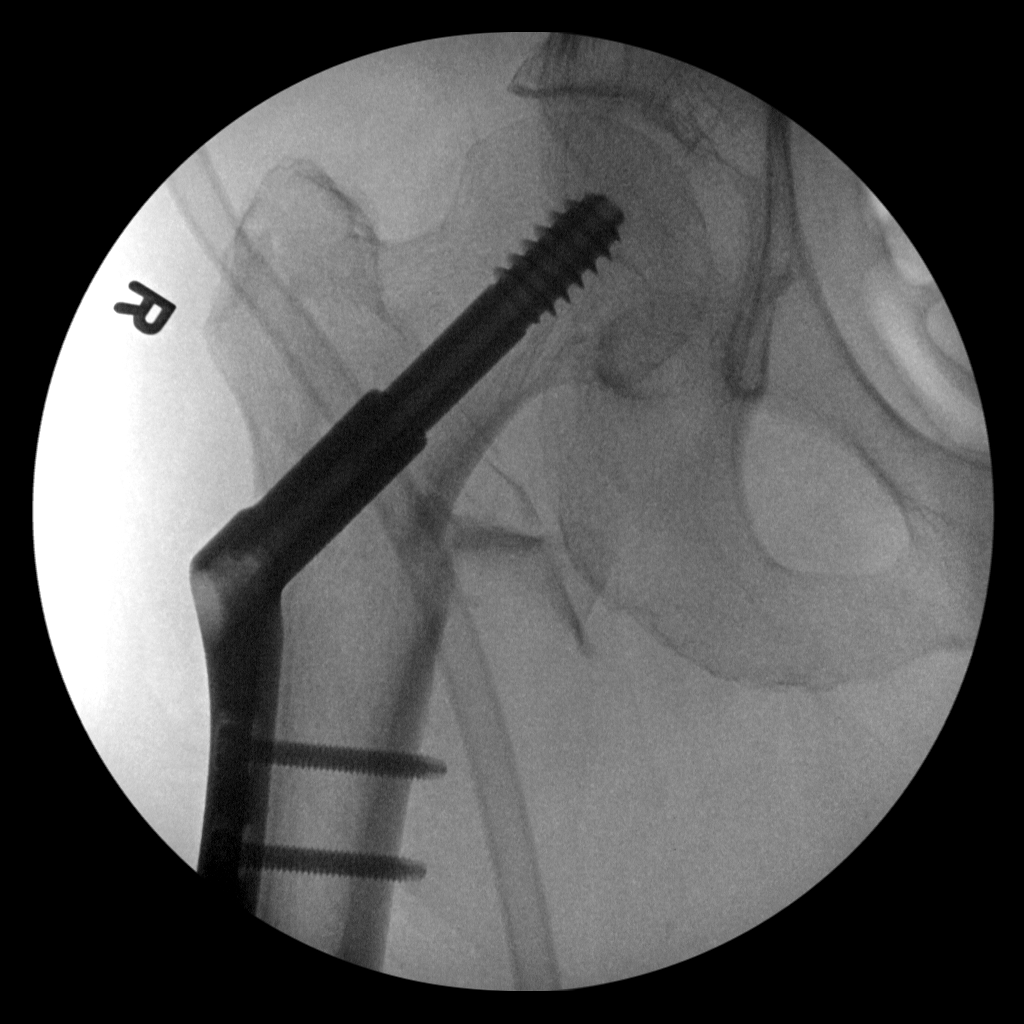
[im 2/3]
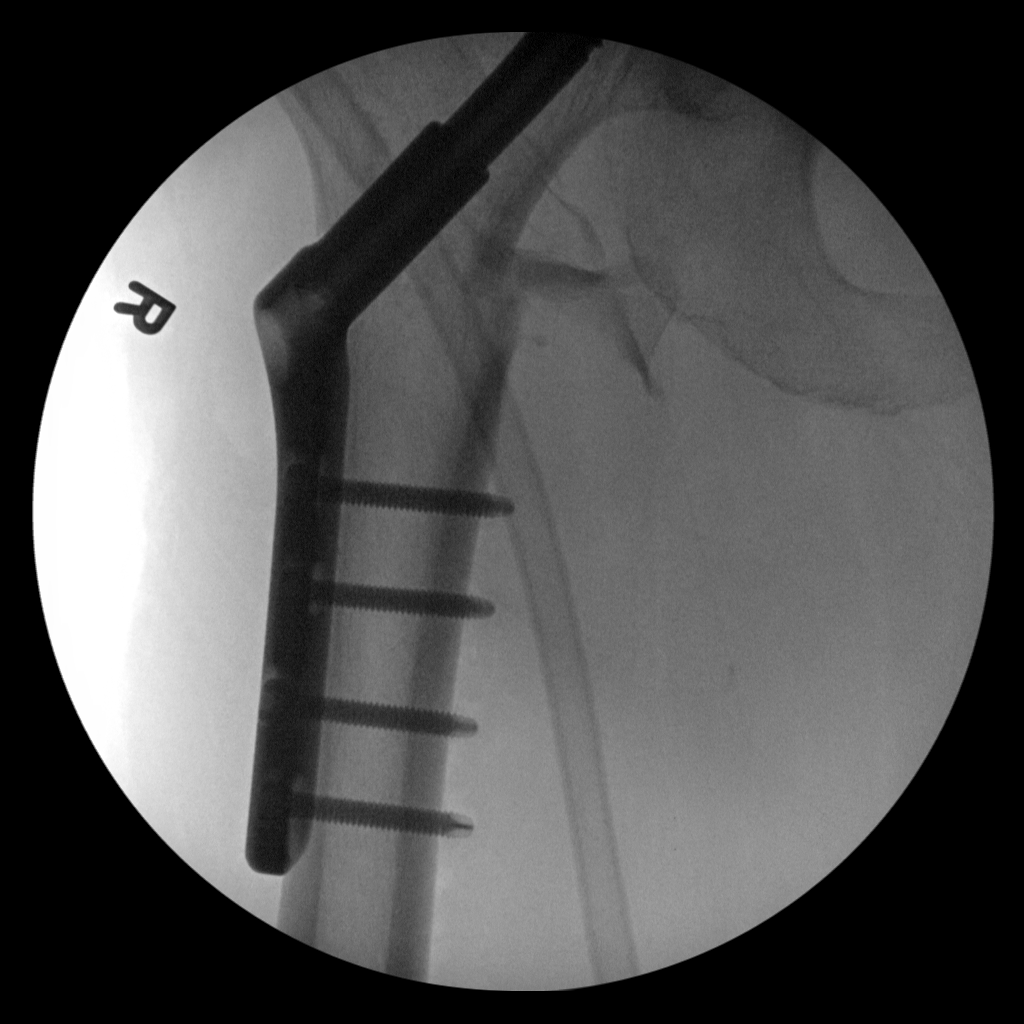
[im 3/3]
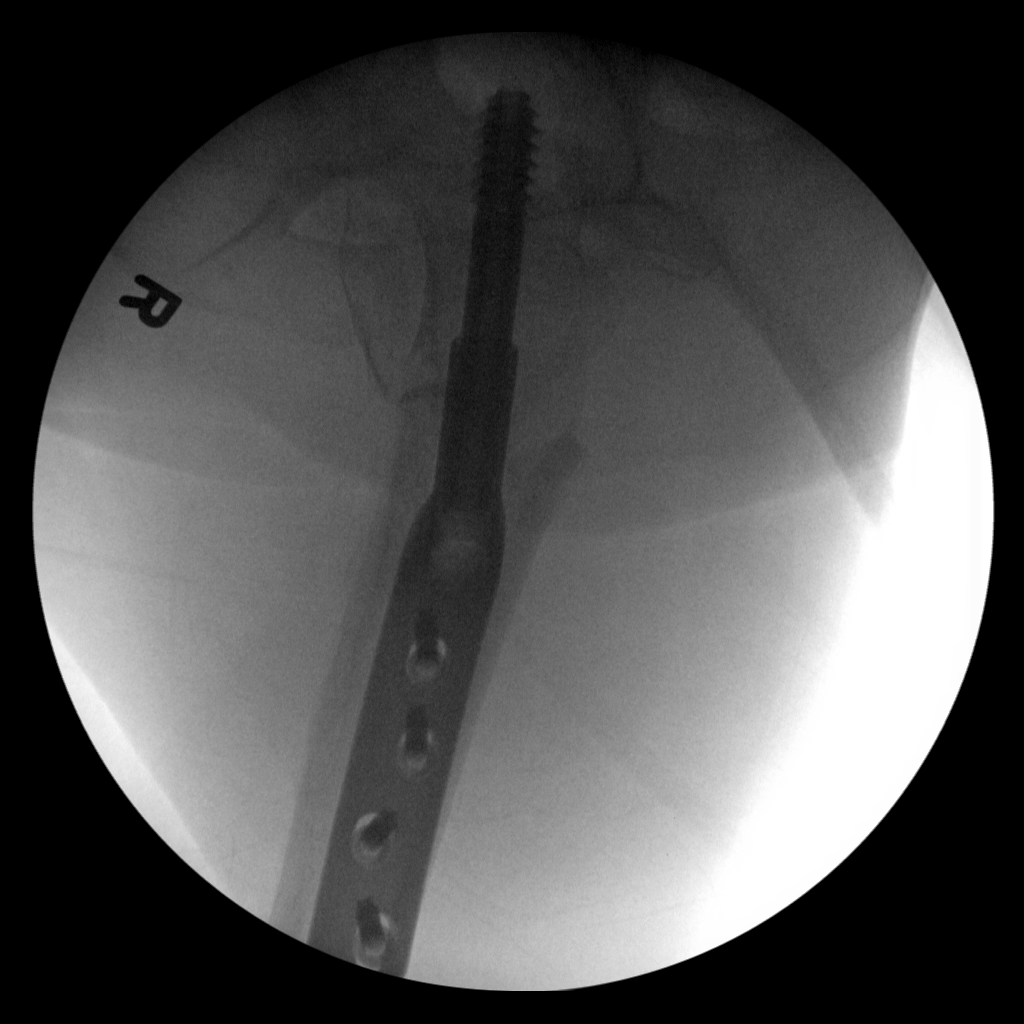

[3 of 3 positions shown; findings below may reference images not displayed]

FINDINGS: Three intraoperative fluoroscopic views demonstrate
fixation of the comminuted intertrochanteric right femur fracture
with lateral plate and screws and proximal dynamic hip screw. Near
anatomic alignment.  No adverse hardware features.
IMPRESSION: ORIF right proximal femur with no adverse features.

## 2012-10-24 ENCOUNTER — Other Ambulatory Visit: Payer: Self-pay | Admitting: Orthopaedic Surgery

## 2012-10-26 ENCOUNTER — Encounter (HOSPITAL_COMMUNITY): Payer: Self-pay

## 2012-10-27 NOTE — H&P (Signed)
[name] is an 72 y.o. female.   Chief Complaint: Right shoulder pain HPI: [name] fell in a parking lot on October 02, 2012 injuring her right shoulder.  We have seen her in our office and x-rays revealed a completely displaced proximal humerus fracture right side.  We have treated her conservatively up until now but pain is becoming an increasing issue.  We have discussed with her proceeding with open reduction internal fixation of her right proximal reamers fracture.  She is currently at [facility] nursing facility.  Recent x-rays taken on 10/24/12 and show a completely displaced right proximal humerus fracture.  Past Medical History  Diagnosis Date  . Peripheral vascular disease   . Subdural hematoma   . Ataxia   . Mild dysplasia of cervix   . UTI (lower urinary tract infection)   . Urinary incontinence   . Seizures   . Cerebrovascular disease   . Personal history of colonic polyps     adenomatous 1997 & tubular adenoma and hyplastic  2008  . Hyperlipidemia   . H/O alcohol abuse   . Diverticulosis of colon (without mention of hemorrhage)   . Redundant colon   . Squamous cell carcinoma of mouth   . Stroke   . C. difficile colitis   . Dementia t  . Ulcerative colitis   . Adenocarcinoma, breast     bilateral  . Hypothyroidism     Past Surgical History  Procedure Laterality Date  . Subdural hematoma evacuation via craniotomy    . Oral surgery for squamous cell carcinoma of the mouth    . Multiple tooth extractions      due to oral cancer  . Cataract extraction, bilateral    . Breast lumpectomy      left breast with radiation therapy  . Carotid endarterectomy      left  . Mastectomy      Right, history with nodule dissection  . Orif hip fracture  Sept '12    Right hip: screw and plate repair.  . Larynx surgery  08/2010    [facility]    Family History  Problem Relation Age of Onset  . Coronary artery disease Mother   . Heart disease Mother   . Diabetes Sister   .  Breast cancer Maternal Aunt   . Breast cancer Sister    Social History:  reports that she quit smoking about 8 years ago. She has never used smokeless tobacco. She reports that she does not drink alcohol or use illicit drugs.  Allergies: No Known Allergies  No prescriptions prior to admission    No results found for this or any previous visit (from the past 48 hour(s)). No results found.  Review of Systems  Constitutional: Negative.   HENT: Positive for sore throat.   Eyes: Negative.   Respiratory: Negative.   Cardiovascular: Negative.   Gastrointestinal: Negative.   Genitourinary: Negative.   Musculoskeletal: Positive for joint pain.  Skin: Negative.   Neurological: Positive for seizures.  Psychiatric/Behavioral: Negative.     There were no vitals taken for this visit. Physical Exam  Constitutional: She appears well-nourished.  HENT:  Head: Normocephalic.  Eyes: Pupils are equal, round, and reactive to light.  Neck:  History of throat cancer.  Cardiovascular: Regular rhythm.   Respiratory: Breath sounds normal.  GI: Bowel sounds are normal.  Musculoskeletal:  Right shoulder exam: She does have significant pain with any attempts of range of motion.  Sensory and motor status to her  fingers and hand are normal.  The most swelling.  Neurological: She is alert.  Skin: Skin is warm.  Psychiatric: She has a normal mood and affect.     Assessment/Plan Assessment: Right proximal humerus fracture displaced date of injury 10/02/12 2.   Right pubic ramus fracture  On same date.  Plan: We have discussed proceeding with open reduction internal fixation of her fracture because of debilitating pain.  We have reviewed the risks of anesthesia, infection and neurovascular injury and even death related to this type of procedure.  We are hopeful that we can do the surgery on an observation status in the hospital and get her back to [facility] nursing facility in 1-2  days.  [name] R 10/27/2012, 1:24 PM

## 2012-10-29 ENCOUNTER — Other Ambulatory Visit (HOSPITAL_COMMUNITY): Payer: Self-pay | Admitting: Anesthesiology

## 2012-10-29 ENCOUNTER — Ambulatory Visit (HOSPITAL_COMMUNITY)
Admission: RE | Admit: 2012-10-29 | Discharge: 2012-10-29 | Disposition: A | Discharge status: Home / Self Care | Payer: Medicare Other | Source: Ambulatory Visit | Attending: Anesthesiology | Admitting: Anesthesiology

## 2012-10-29 ENCOUNTER — Other Ambulatory Visit (HOSPITAL_COMMUNITY): Payer: Self-pay | Admitting: *Deleted

## 2012-10-29 ENCOUNTER — Encounter (HOSPITAL_COMMUNITY)
Admission: RE | Admit: 2012-10-29 | Discharge: 2012-10-29 | Disposition: A | Discharge status: Home / Self Care | Payer: Medicare Other | Source: Ambulatory Visit | Attending: Orthopaedic Surgery | Admitting: Orthopaedic Surgery

## 2012-10-29 ENCOUNTER — Encounter (HOSPITAL_COMMUNITY): Payer: Self-pay

## 2012-10-29 DIAGNOSIS — Z01818 Encounter for other preprocedural examination: Secondary | ICD-10-CM

## 2012-10-29 HISTORY — DX: Pain in unspecified joint: M25.50

## 2012-10-29 HISTORY — DX: Personal history of urinary (tract) infections: Z87.440

## 2012-10-29 HISTORY — DX: Effusion, unspecified joint: M25.40

## 2012-10-29 HISTORY — DX: Anxiety disorder, unspecified: F41.9

## 2012-10-29 HISTORY — DX: Malignant neoplasm of larynx, unspecified: C32.9

## 2012-10-29 HISTORY — DX: Unspecified osteoarthritis, unspecified site: M19.90

## 2012-10-29 LAB — BASIC METABOLIC PANEL
CO2: 29 mEq/L (ref 19–32)
Calcium: 9.6 mg/dL (ref 8.4–10.5)
GFR calc non Af Amer: >90 mL/min (ref >90)
Potassium: 4.5 mEq/L (ref 3.5–5.1)
Sodium: 130 mEq/L — ABNORMAL LOW (ref 135–145)

## 2012-10-29 LAB — CBC
Hemoglobin: 12.6 g/dL (ref 12.0–15.0)
MCV: 89.6 fL (ref 78.0–100.0)
Platelets: 175 10*3/uL (ref 150–400)
RBC: 4.02 MIL/uL (ref 3.87–5.11)
WBC: 7.8 10*3/uL (ref 4.0–10.5)

## 2012-10-29 LAB — SURGICAL PCR SCREEN: Staphylococcus aureus: NEGATIVE

## 2012-10-29 MED ORDER — CEFAZOLIN SODIUM-DEXTROSE 2-3 GM-% IV SOLR
2.0000 g | INTRAVENOUS | Status: AC
  Administered 2012-10-30: 2 g via INTRAVENOUS
  Filled 2012-10-29: qty 50

## 2012-10-29 NOTE — Pre-Procedure Instructions (Signed)
[name]  10/29/2012   Your procedure is scheduled on:  Tues, April 29 @ 10:15 AM  Report to [facility] at 8:15 AM.  Call this number if you have problems the morning of surgery: [phone]   Remember:   Do not eat food or drink liquids after midnight.   Take these medicines the morning of surgery with A SIP OF WATER: Alprazolam(Xanax),Diltiazem(Cardizem),Pain Pill(if needed),Keppra(Levetiracetam),Levothyroxine(Synthroid),and Phenobarbital(Luminal)   Do not wear jewelry, make-up or nail polish.  Do not wear lotions, powders, or perfumes. You may wear deodorant.  Do not shave 48 hours prior to surgery.   Do not bring valuables to the hospital.  Contacts, dentures or bridgework may not be worn into surgery.  Leave suitcase in the car. After surgery it may be brought to your room.  For patients admitted to the hospital, checkout time is 11:00 AM the day of  discharge.   Patients discharged the day of surgery will not be allowed to drive  home.    Special Instructions: Shower using CHG 2 nights before surgery and the night before surgery.  If you shower the day of surgery use CHG.  Use special wash - you have one bottle of CHG for all showers.  You should use approximately 1/3 of the bottle for each shower. N/A   Please read over the following fact sheets that you were given: Pain Booklet, Coughing and Deep Breathing, MRSA Information and Surgical Site Infection Prevention

## 2012-10-29 NOTE — Progress Notes (Signed)
Pt doesn't have a cardiologist;in 2012 after pelvic fracture went into [facility] and a cardiologist made rounds on her at [facility]-but didn't need follow up other than with medical MD  Dr.Michael NOrrins is Medical MD   EKG in epic from 10-02-12  Denies ever having an echo/stress test/heart cath

## 2012-10-30 ENCOUNTER — Encounter (HOSPITAL_COMMUNITY): Payer: Self-pay | Admitting: *Deleted

## 2012-10-30 ENCOUNTER — Ambulatory Visit (HOSPITAL_COMMUNITY): Payer: Medicare Other | Admitting: *Deleted

## 2012-10-30 ENCOUNTER — Encounter (HOSPITAL_COMMUNITY)
Admission: RE | Disposition: A | Discharge status: Skilled Nursing Facility | Payer: Self-pay | Source: Ambulatory Visit | Attending: Orthopaedic Surgery

## 2012-10-30 ENCOUNTER — Observation Stay (HOSPITAL_COMMUNITY)
Admission: RE | Admit: 2012-10-30 | Discharge: 2012-10-31 | Disposition: A | Discharge status: Skilled Nursing Facility | Payer: Medicare Other | Source: Ambulatory Visit | Attending: Orthopaedic Surgery | Admitting: Orthopaedic Surgery

## 2012-10-30 ENCOUNTER — Ambulatory Visit (HOSPITAL_COMMUNITY): Payer: Medicare Other

## 2012-10-30 DIAGNOSIS — Y9289 Other specified places as the place of occurrence of the external cause: Secondary | ICD-10-CM | POA: Insufficient documentation

## 2012-10-30 DIAGNOSIS — W19XXXA Unspecified fall, initial encounter: Secondary | ICD-10-CM | POA: Insufficient documentation

## 2012-10-30 DIAGNOSIS — Z01818 Encounter for other preprocedural examination: Secondary | ICD-10-CM | POA: Insufficient documentation

## 2012-10-30 DIAGNOSIS — S42213A Unspecified displaced fracture of surgical neck of unspecified humerus, initial encounter for closed fracture: Principal | ICD-10-CM | POA: Insufficient documentation

## 2012-10-30 DIAGNOSIS — IMO0001 Reserved for inherently not codable concepts without codable children: Secondary | ICD-10-CM | POA: Insufficient documentation

## 2012-10-30 DIAGNOSIS — S42201A Unspecified fracture of upper end of right humerus, initial encounter for closed fracture: Secondary | ICD-10-CM

## 2012-10-30 DIAGNOSIS — Z01812 Encounter for preprocedural laboratory examination: Secondary | ICD-10-CM | POA: Insufficient documentation

## 2012-10-30 HISTORY — PX: ORIF HUMERUS FRACTURE: SHX2126

## 2012-10-30 SURGERY — OPEN REDUCTION INTERNAL FIXATION (ORIF) HUMERAL SHAFT FRACTURE
Anesthesia: Regional | Site: Arm Upper | Laterality: Right | Wound class: Clean

## 2012-10-30 MED ORDER — MEPERIDINE HCL 25 MG/ML IJ SOLN: 6.2500 mg | INTRAMUSCULAR | Status: DC | PRN

## 2012-10-30 MED ORDER — ONDANSETRON HCL 4 MG/2ML IJ SOLN: 4.0000 mg | Freq: Four times a day (QID) | INTRAMUSCULAR | Status: DC | PRN

## 2012-10-30 MED ORDER — NEOSTIGMINE METHYLSULFATE 1 MG/ML IJ SOLN
INTRAMUSCULAR | Status: DC | PRN
  Administered 2012-10-30: 2 mg via INTRAVENOUS
  Administered 2012-10-30: 1 mg via INTRAVENOUS

## 2012-10-30 MED ORDER — HYDROMORPHONE HCL PF 1 MG/ML IJ SOLN: 0.2500 mg | INTRAMUSCULAR | Status: DC | PRN

## 2012-10-30 MED ORDER — LACTATED RINGERS IV SOLN: INTRAVENOUS | Status: DC

## 2012-10-30 MED ORDER — LACTATED RINGERS IV SOLN
INTRAVENOUS | Status: DC
  Administered 2012-10-30: 10:00:00 via INTRAVENOUS

## 2012-10-30 MED ORDER — ASPIRIN 81 MG PO CHEW
81.0000 mg | CHEWABLE_TABLET | Freq: Every day | ORAL | Status: DC
  Administered 2012-10-30 – 2012-10-31 (×2): 81 mg via ORAL
  Filled 2012-10-30 (×2): qty 1

## 2012-10-30 MED ORDER — PHENOBARBITAL 32.4 MG PO TABS
97.2000 mg | ORAL_TABLET | Freq: Every day | ORAL | Status: DC
  Administered 2012-10-31: 97.2 mg via ORAL
  Filled 2012-10-30: qty 3

## 2012-10-30 MED ORDER — HYDROCODONE-ACETAMINOPHEN 5-325 MG PO TABS
1.0000 | ORAL_TABLET | ORAL | Status: DC | PRN
  Administered 2012-10-31 (×2): 2 via ORAL
  Filled 2012-10-30 (×2): qty 2

## 2012-10-30 MED ORDER — ALPRAZOLAM 0.5 MG PO TABS: 0.5000 mg | ORAL_TABLET | Freq: Three times a day (TID) | ORAL | Status: DC | PRN

## 2012-10-30 MED ORDER — MESALAMINE 1.2 G PO TBEC
2.4000 g | DELAYED_RELEASE_TABLET | Freq: Two times a day (BID) | ORAL | Status: DC
  Administered 2012-10-30 – 2012-10-31 (×2): 2.4 g via ORAL
  Filled 2012-10-30 (×3): qty 2

## 2012-10-30 MED ORDER — SIMVASTATIN 5 MG PO TABS
5.0000 mg | ORAL_TABLET | Freq: Every day | ORAL | Status: DC
  Administered 2012-10-30: 5 mg via ORAL
  Filled 2012-10-30 (×2): qty 1

## 2012-10-30 MED ORDER — PHENYLEPHRINE HCL 10 MG/ML IJ SOLN
INTRAMUSCULAR | Status: DC | PRN
  Administered 2012-10-30: 80 ug via INTRAVENOUS
  Administered 2012-10-30 (×2): 120 ug via INTRAVENOUS
  Administered 2012-10-30: 80 ug via INTRAVENOUS

## 2012-10-30 MED ORDER — ROCURONIUM BROMIDE 100 MG/10ML IV SOLN
INTRAVENOUS | Status: DC | PRN
  Administered 2012-10-30: 20 mg via INTRAVENOUS
  Administered 2012-10-30: 30 mg via INTRAVENOUS

## 2012-10-30 MED ORDER — CEFAZOLIN SODIUM-DEXTROSE 2-3 GM-% IV SOLR
2.0000 g | Freq: Four times a day (QID) | INTRAVENOUS | Status: AC
  Administered 2012-10-30 – 2012-10-31 (×2): 2 g via INTRAVENOUS
  Filled 2012-10-30 (×2): qty 50

## 2012-10-30 MED ORDER — ONDANSETRON HCL 4 MG/2ML IJ SOLN: 4.0000 mg | Freq: Once | INTRAMUSCULAR | Status: DC | PRN

## 2012-10-30 MED ORDER — VITAMIN C 500 MG PO TABS
500.0000 mg | ORAL_TABLET | Freq: Every day | ORAL | Status: DC
  Administered 2012-10-31: 500 mg via ORAL
  Filled 2012-10-30: qty 1

## 2012-10-30 MED ORDER — OXYCODONE HCL 5 MG PO TABS: 5.0000 mg | ORAL_TABLET | Freq: Once | ORAL | Status: DC | PRN

## 2012-10-30 MED ORDER — HYDROMORPHONE HCL PF 1 MG/ML IJ SOLN
0.5000 mg | INTRAMUSCULAR | Status: DC | PRN
  Administered 2012-10-31: 0.5 mg via INTRAVENOUS
  Filled 2012-10-30: qty 1

## 2012-10-30 MED ORDER — DILTIAZEM HCL ER COATED BEADS 180 MG PO CP24
180.0000 mg | ORAL_CAPSULE | Freq: Every day | ORAL | Status: DC
  Administered 2012-10-31: 180 mg via ORAL
  Filled 2012-10-30: qty 1

## 2012-10-30 MED ORDER — LOPERAMIDE HCL 2 MG PO CAPS
2.0000 mg | ORAL_CAPSULE | Freq: Four times a day (QID) | ORAL | Status: DC | PRN
  Filled 2012-10-30: qty 1

## 2012-10-30 MED ORDER — SODIUM CHLORIDE 0.9 % IR SOLN
Status: DC | PRN
  Administered 2012-10-30: 1000 mL

## 2012-10-30 MED ORDER — FOLIC ACID 1 MG PO TABS
1.0000 mg | ORAL_TABLET | Freq: Every day | ORAL | Status: DC
  Administered 2012-10-30 – 2012-10-31 (×2): 1 mg via ORAL
  Filled 2012-10-30 (×2): qty 1

## 2012-10-30 MED ORDER — METOCLOPRAMIDE HCL 5 MG/ML IJ SOLN: 5.0000 mg | Freq: Three times a day (TID) | INTRAMUSCULAR | Status: DC | PRN

## 2012-10-30 MED ORDER — LACTATED RINGERS IV SOLN
INTRAVENOUS | Status: DC | PRN
  Administered 2012-10-30 (×2): via INTRAVENOUS

## 2012-10-30 MED ORDER — ARTIFICIAL TEARS OP OINT
TOPICAL_OINTMENT | OPHTHALMIC | Status: DC | PRN
  Administered 2012-10-30: 1 via OPHTHALMIC

## 2012-10-30 MED ORDER — BUPIVACAINE-EPINEPHRINE PF 0.5-1:200000 % IJ SOLN
INTRAMUSCULAR | Status: DC | PRN
  Administered 2012-10-30: 30 mL

## 2012-10-30 MED ORDER — SODIUM CHLORIDE 0.9 % IV SOLN
10.0000 mg | INTRAVENOUS | Status: DC | PRN
  Administered 2012-10-30: 50 ug/min via INTRAVENOUS

## 2012-10-30 MED ORDER — METOCLOPRAMIDE HCL 10 MG PO TABS: 5.0000 mg | ORAL_TABLET | Freq: Three times a day (TID) | ORAL | Status: DC | PRN

## 2012-10-30 MED ORDER — ONDANSETRON HCL 4 MG/2ML IJ SOLN
INTRAMUSCULAR | Status: DC | PRN
  Administered 2012-10-30: 4 mg via INTRAVENOUS

## 2012-10-30 MED ORDER — LEVOTHYROXINE SODIUM 50 MCG PO TABS
50.0000 ug | ORAL_TABLET | Freq: Every day | ORAL | Status: DC
  Administered 2012-10-31: 50 ug via ORAL
  Filled 2012-10-30 (×2): qty 1

## 2012-10-30 MED ORDER — EPHEDRINE SULFATE 50 MG/ML IJ SOLN
INTRAMUSCULAR | Status: DC | PRN
Start: 2012-10-30 — End: 2012-10-30
  Administered 2012-10-30: 2.5 mg via INTRAVENOUS

## 2012-10-30 MED ORDER — ONDANSETRON HCL 4 MG PO TABS: 4.0000 mg | ORAL_TABLET | Freq: Four times a day (QID) | ORAL | Status: DC | PRN

## 2012-10-30 MED ORDER — LEVETIRACETAM 750 MG PO TABS
1500.0000 mg | ORAL_TABLET | Freq: Two times a day (BID) | ORAL | Status: DC
  Administered 2012-10-30 – 2012-10-31 (×2): 1500 mg via ORAL
  Filled 2012-10-30 (×3): qty 2

## 2012-10-30 MED ORDER — OXYCODONE HCL 5 MG/5ML PO SOLN: 5.0000 mg | Freq: Once | ORAL | Status: DC | PRN

## 2012-10-30 MED ORDER — GLYCOPYRROLATE 0.2 MG/ML IJ SOLN
INTRAMUSCULAR | Status: DC | PRN
  Administered 2012-10-30 (×2): 0.2 mg via INTRAVENOUS

## 2012-10-30 MED ORDER — PROPOFOL 10 MG/ML IV BOLUS
INTRAVENOUS | Status: DC | PRN
  Administered 2012-10-30: 150 mg via INTRAVENOUS

## 2012-10-30 MED ORDER — ZOLPIDEM TARTRATE 5 MG PO TABS: 5.0000 mg | ORAL_TABLET | Freq: Every evening | ORAL | Status: DC | PRN

## 2012-10-30 MED ORDER — LIDOCAINE VISCOUS 2 % MT SOLN
20.0000 mL | OROMUCOSAL | Status: DC | PRN
  Administered 2012-10-30: 20 mL via OROMUCOSAL
  Filled 2012-10-30: qty 20

## 2012-10-30 SURGICAL SUPPLY — 92 items
BENZOIN TINCTURE PRP APPL 2/3 (GAUZE/BANDAGES/DRESSINGS) ×2 IMPLANT
BIT DRILL 2.8X4 QC CORT (BIT) ×2 IMPLANT
BIT DRILL 4 LONG FAST STEP (BIT) ×2 IMPLANT
BIT DRILL 4 SHORT FAST STEP (BIT) ×2 IMPLANT
BLADE SURG ROTATE 9660 (MISCELLANEOUS) IMPLANT
CLOTH BEACON ORANGE TIMEOUT ST (SAFETY) ×2 IMPLANT
COVER SURGICAL LIGHT HANDLE (MISCELLANEOUS) ×2 IMPLANT
COVER TABLE BACK 60X90 (DRAPES) IMPLANT
DECANTER SPIKE VIAL GLASS SM (MISCELLANEOUS) IMPLANT
DRAPE INCISE IOBAN 66X45 STRL (DRAPES) ×2 IMPLANT
DRAPE PROXIMA HALF (DRAPES) ×2 IMPLANT
DRAPE U-SHAPE 47X51 STRL (DRAPES) ×6 IMPLANT
DRILL BIT 6.4MMX7INL DISPOSE (BIT) IMPLANT
DRILL BIT 7/64X5 (BIT) IMPLANT
DRSG ADAPTIC 3X8 NADH LF (GAUZE/BANDAGES/DRESSINGS) ×2 IMPLANT
DRSG PAD ABDOMINAL 8X10 ST (GAUZE/BANDAGES/DRESSINGS) ×2 IMPLANT
DURAPREP 26ML APPLICATOR (WOUND CARE) ×2 IMPLANT
ELECT CAUTERY BLADE 6.4 (BLADE) ×2 IMPLANT
ELECT REM PT RETURN 9FT ADLT (ELECTROSURGICAL) ×2
ELECTRODE REM PT RTRN 9FT ADLT (ELECTROSURGICAL) ×1 IMPLANT
EVACUATOR 1/8 PVC DRAIN (DRAIN) IMPLANT
FACESHIELD STD STERILE (MASK) ×2 IMPLANT
GLOVE BIO SURGEON STRL SZ7.5 (GLOVE) ×2 IMPLANT
GLOVE BIO SURGEON STRL SZ8.5 (GLOVE) ×2 IMPLANT
GLOVE BIOGEL PI IND STRL 8 (GLOVE) ×2 IMPLANT
GLOVE BIOGEL PI IND STRL 8.5 (GLOVE) ×1 IMPLANT
GLOVE BIOGEL PI INDICATOR 8 (GLOVE) ×2
GLOVE BIOGEL PI INDICATOR 8.5 (GLOVE) ×1
GLOVE BIOGEL PI ORTHO PRO SZ8 (GLOVE) ×2
GLOVE PI ORTHO PRO STRL SZ8 (GLOVE) ×2 IMPLANT
GLOVE SS BIOGEL STRL SZ 8 (GLOVE) ×2 IMPLANT
GLOVE SUPERSENSE BIOGEL SZ 8 (GLOVE) ×2
GLOVE SURG SS PI 7.5 STRL IVOR (GLOVE) ×4 IMPLANT
GOWN PREVENTION PLUS XLARGE (GOWN DISPOSABLE) ×2 IMPLANT
GOWN PREVENTION PLUS XXLARGE (GOWN DISPOSABLE) ×2 IMPLANT
GOWN SRG XL XLNG 56XLVL 4 (GOWN DISPOSABLE) ×3 IMPLANT
GOWN STRL NON-REIN LRG LVL3 (GOWN DISPOSABLE) IMPLANT
GOWN STRL NON-REIN XL XLG LVL4 (GOWN DISPOSABLE) ×3
HANDPIECE INTERPULSE COAX TIP (DISPOSABLE) ×1
HOOD PEEL AWAY FACE SHEILD DIS (HOOD) ×2 IMPLANT
KIT BASIN OR (CUSTOM PROCEDURE TRAY) ×2 IMPLANT
KIT ROOM TURNOVER OR (KITS) ×2 IMPLANT
MANIFOLD NEPTUNE II (INSTRUMENTS) ×2 IMPLANT
NDL SUT 6 .5 CRC .975X.05 MAYO (NEEDLE) ×1 IMPLANT
NEEDLE 22X1 1/2 (OR ONLY) (NEEDLE) IMPLANT
NEEDLE MAYO TAPER (NEEDLE) ×1
NS IRRIG 1000ML POUR BTL (IV SOLUTION) ×2 IMPLANT
PACK SHOULDER (CUSTOM PROCEDURE TRAY) ×2 IMPLANT
PAD ARMBOARD 7.5X6 YLW CONV (MISCELLANEOUS) ×2 IMPLANT
PASSER SUT SWANSON 36MM LOOP (INSTRUMENTS) ×2 IMPLANT
PEG STND 4.0X32.5MM (Orthopedic Implant) ×2 IMPLANT
PEG STND 4.0X35MM (Orthopedic Implant) ×2 IMPLANT
PEG STND 4.0X40MM (Orthopedic Implant) ×2 IMPLANT
PEG STND 4.0X42.5MM (Orthopedic Implant) ×2 IMPLANT
PEG STND 4.0X45.0MM (Orthopedic Implant) ×2 IMPLANT
PEGSTD 4.0X32.5MM (Orthopedic Implant) ×1 IMPLANT
PEGSTD 4.0X35MM (Orthopedic Implant) ×1 IMPLANT
PEGSTD 4.0X40MM (Orthopedic Implant) ×1 IMPLANT
PEGSTD 4.0X42.5MM (Orthopedic Implant) ×1 IMPLANT
PEGSTD 4.0X45.0MM (Orthopedic Implant) ×1 IMPLANT
PIN GUIDE SHOULDER 2.0MM (PIN) ×4 IMPLANT
PLATE SHOULDER S3 3HOLE RT (Plate) ×2 IMPLANT
SCREW LOCK 90D ANGLED 3.8X22 (Screw) ×2 IMPLANT
SCREW LOCK 90D ANGLED 3.8X24 (Screw) ×2 IMPLANT
SCREW LOCK 90D ANGLED 3.8X28 (Screw) ×2 IMPLANT
SCREW MULTIDIR SS 3.8X28 HUMRL (Screw) ×2 IMPLANT
SET HNDPC FAN SPRY TIP SCT (DISPOSABLE) ×1 IMPLANT
SET PAD KNEE POSITIONER (MISCELLANEOUS) IMPLANT
SLING ARM IMMOBILIZER LRG (SOFTGOODS) IMPLANT
SLING ARM IMMOBILIZER MED (SOFTGOODS) ×2 IMPLANT
SPONGE GAUZE 4X4 12PLY (GAUZE/BANDAGES/DRESSINGS) ×2 IMPLANT
SPONGE LAP 18X18 X RAY DECT (DISPOSABLE) ×2 IMPLANT
SPONGE LAP 4X18 X RAY DECT (DISPOSABLE) ×2 IMPLANT
STAPLER VISISTAT 35W (STAPLE) ×2 IMPLANT
STRIP CLOSURE SKIN 1/2X4 (GAUZE/BANDAGES/DRESSINGS) ×2 IMPLANT
SUCTION FRAZIER TIP 10 FR DISP (SUCTIONS) ×2 IMPLANT
SUPPORT WRAP ARM LG (MISCELLANEOUS) ×2 IMPLANT
SUT ETHIBOND NAB CT1 #1 30IN (SUTURE) ×4 IMPLANT
SUT FIBERWIRE #2 38 T-5 BLUE (SUTURE) ×6
SUT VIC AB 0 CT1 27 (SUTURE) ×1
SUT VIC AB 0 CT1 27XBRD ANBCTR (SUTURE) ×1 IMPLANT
SUT VIC AB 1 CT1 27 (SUTURE) ×1
SUT VIC AB 1 CT1 27XBRD ANBCTR (SUTURE) ×1 IMPLANT
SUT VIC AB 2-0 CT1 27 (SUTURE) ×1
SUT VIC AB 2-0 CT1 TAPERPNT 27 (SUTURE) ×1 IMPLANT
SUTURE FIBERWR #2 38 T-5 BLUE (SUTURE) ×3 IMPLANT
SYR CONTROL 10ML LL (SYRINGE) IMPLANT
TOWEL OR 17X24 6PK STRL BLUE (TOWEL DISPOSABLE) ×2 IMPLANT
TOWEL OR 17X26 10 PK STRL BLUE (TOWEL DISPOSABLE) ×2 IMPLANT
TRAY FOLEY CATH 14FR (SET/KITS/TRAYS/PACK) ×2 IMPLANT
WATER STERILE IRR 1000ML POUR (IV SOLUTION) IMPLANT
YANKAUER SUCT BULB TIP NO VENT (SUCTIONS) ×2 IMPLANT

## 2012-10-30 NOTE — Progress Notes (Signed)
Pt has pre existing trach site with HME voice box.

## 2012-10-30 NOTE — Brief Op Note (Signed)
[name] [id number] 10/30/2012   PRE-OP DIAGNOSIS: right proximal humerus fracture   POST-OP DIAGNOSIS: same  PROCEDURE: right proximal humerus ORIF  ANESTHESIA: general and block  [name]   Dictation #:  [id number]

## 2012-10-30 NOTE — Anesthesia Procedure Notes (Addendum)
Anesthesia Regional Block:  Interscalene brachial plexus block  Pre-Anesthetic Checklist: ,, timeout performed, Correct Patient, Correct Site, Correct Laterality, Correct Procedure, Correct Position, site marked, Risks and benefits discussed,  Surgical consent,  Pre-op evaluation,  At surgeon's request and post-op pain management  Laterality: Right  Prep: chloraprep       Needles:  Injection technique: Single-shot  Needle Type: Echogenic Stimulator Needle     Needle Length: 5cm 5 cm     Additional Needles:  Procedures: ultrasound guided (picture in chart) and nerve stimulator Interscalene brachial plexus block  Nerve Stimulator or Paresthesia:  Response: 0.4 mA,   Additional Responses:   Narrative:  Start time: 10/30/2012 10:05 AM End time: 10/30/2012 10:20 AM  Performed by: Personally  Anesthesiologist: Arta Bruce MD  Additional Notes: Monitors applied. Patient sedated. Sterile prep and drape,hand hygiene and sterile gloves were used. Relevant anatomy identified.Needle position confirmed.Local anesthetic injected incrementally after negative aspiration. Local anesthetic spread visualized around nerve(s). Vascular puncture avoided. No complications. Image printed for medical record.The patient tolerated the procedure well.       Interscalene brachial plexus block

## 2012-10-30 NOTE — Anesthesia Preprocedure Evaluation (Addendum)
Anesthesia Evaluation  Patient identified by MRN, date of birth, ID bandGeneral Assessment Comment:Pt sleeping, responsive to verbal and tactile stimuli  Reviewed: Allergy & Precautions, H&P , NPO status , Patient's Chart, lab work & pertinent test results, reviewed documented beta blocker date and time   Airway Mallampati: II TM Distance: >3 FB Neck ROM: Limited    Dental   Pulmonary former smoker,  H/o Laryngeal CA, laryngectomy 2012--H/o radiation to face/neck breath sounds clear to auscultation  + decreased breath sounds      Cardiovascular hypertension, Pt. on medications + Peripheral Vascular Disease + dysrhythmias Atrial Fibrillation Rhythm:Regular     Neuro/Psych Seizures -,  Anxiety CVA, Residual Symptoms    GI/Hepatic Neg liver ROS, PUD,   Endo/Other  Hypothyroidism   Renal/GU negative Renal ROS     Musculoskeletal   Abdominal Normal abdominal exam  (+)   Peds  Hematology negative hematology ROS (+)   Anesthesia Other Findings   Reproductive/Obstetrics                           Anesthesia Physical Anesthesia Plan  ASA: III  Anesthesia Plan: General   Post-op Pain Management:    Induction: Intravenous  Airway Management Planned: Oral ETT and Tracheostomy  Additional Equipment:   Intra-op Plan:   Post-operative Plan: Extubation in OR  Informed Consent: I have reviewed the patients History and Physical, chart, labs and discussed the procedure including the risks, benefits and alternatives for the proposed anesthesia with the patient or authorized representative who has indicated his/her understanding and acceptance.   Dental advisory given  Plan Discussed with: Surgeon and CRNA  Anesthesia Plan Comments:        Anesthesia Quick Evaluation

## 2012-10-30 NOTE — Progress Notes (Signed)
RT came in to check on patient.  Sats were 87%.  RT  Set patient up on a 28% ATC.  Patients sats are now 95%.

## 2012-10-30 NOTE — Brief Op Note (Signed)
10/30/2012  1:24 PM  PATIENT:  [name]  72 y.o. female  PRE-OPERATIVE DIAGNOSIS:  RIGHT PROXIMAL HUMURUS FRACTURE  POST-OPERATIVE DIAGNOSIS:  RIGHT PROXIMAL HUMURUS FRACTURE  PROCEDURE:  Procedure(s) with comments: OPEN REDUCTION INTERNAL FIXATION (ORIF) HUMERAL SHAFT FRACTURE (Right) - BIG C-ARM, SHOULDER POSITION  SURGEON:  Surgeon(s) and Role:    * Velna Ochs, MD - Primary    * Mable Paris, MD - Assisting  PHYSICIAN ASSISTANT:   ASSISTANTS: none   ANESTHESIA:   general  EBL:  Total I/O In: 1000 [I.V.:1000] Out: 500 [Urine:500]  BLOOD ADMINISTERED:none  DRAINS: none   LOCAL MEDICATIONS USED:  NONE  SPECIMEN:  No Specimen  DISPOSITION OF SPECIMEN:  N/A  COUNTS:  YES  TOURNIQUET:  * No tourniquets in log *  DICTATION: .  PLAN OF CARE: Admit for overnight observation  PATIENT DISPOSITION:  PACU - hemodynamically stable.   Delay start of Pharmacological VTE agent (>24hrs) due to surgical blood loss or risk of bleeding: no

## 2012-10-30 NOTE — Transfer of Care (Signed)
Immediate Anesthesia Transfer of Care Note  Patient: [name]  Procedure(s) Performed: Procedure(s) with comments: OPEN REDUCTION INTERNAL FIXATION (ORIF) HUMERAL SHAFT FRACTURE (Right) - BIG C-ARM, SHOULDER POSITION  Patient Location: PACU  Anesthesia Type:General and Regional  Level of Consciousness: awake, alert , oriented, patient cooperative and responds to stimulation  Airway & Oxygen Therapy: Patient Spontanous Breathing and Patient connected to nasal cannula oxygen  Post-op Assessment: Report given to PACU RN and Post -op Vital signs reviewed and stable  Post vital signs: Reviewed and stable  Complications: No apparent anesthesia complications

## 2012-10-30 NOTE — Preoperative (Signed)
Beta Blockers   Reason not to administer Beta Blockers:Not Applicable 

## 2012-10-30 NOTE — Interval H&P Note (Signed)
History and Physical Interval Note:  10/30/2012 8:59 AM  [name]  has presented today for surgery, with the diagnosis of RIGHT PROXIMAL HUMURUS FRACTURE  The various methods of treatment have been discussed with the patient and family. After consideration of risks, benefits and other options for treatment, the patient has consented to  Procedure(s) with comments: OPEN REDUCTION INTERNAL FIXATION (ORIF) HUMERAL SHAFT FRACTURE (Right) - BIG C-ARM, SHOULDER POSITION as a surgical intervention .  The patient's history has been reviewed, patient examined, no change in status, stable for surgery.  I have reviewed the patient's chart and labs.  Questions were answered to the patient's satisfaction.     Xochitl Egle G

## 2012-10-30 NOTE — Progress Notes (Signed)
Husband at bedside.  

## 2012-10-31 ENCOUNTER — Encounter (HOSPITAL_COMMUNITY): Payer: Self-pay | Admitting: Orthopaedic Surgery

## 2012-10-31 LAB — CBC
HCT: 25.6 % — ABNORMAL LOW (ref 36.0–46.0)
Hemoglobin: 8.9 g/dL — ABNORMAL LOW (ref 12.0–15.0)
MCH: 31.3 pg (ref 26.0–34.0)
MCV: 90.1 fL (ref 78.0–100.0)
RBC: 2.84 MIL/uL — ABNORMAL LOW (ref 3.87–5.11)

## 2012-10-31 LAB — COMPREHENSIVE METABOLIC PANEL
ALT: 9 U/L (ref 0–35)
AST: 17 U/L (ref 0–37)
Albumin: 2.8 g/dL — ABNORMAL LOW (ref 3.5–5.2)
Calcium: 8.4 mg/dL (ref 8.4–10.5)
Creatinine, Ser: 0.67 mg/dL (ref 0.50–1.10)
GFR calc non Af Amer: 84 mL/min — ABNORMAL LOW (ref >90)
Sodium: 127 mEq/L — ABNORMAL LOW (ref 135–145)
Total Protein: 5.9 g/dL — ABNORMAL LOW (ref 6.0–8.3)

## 2012-10-31 MED ORDER — WHITE PETROLATUM GEL
Status: AC
  Filled 2012-10-31: qty 5

## 2012-10-31 MED ORDER — ALPRAZOLAM 0.5 MG PO TABS: 0.5000 mg | ORAL_TABLET | Freq: Three times a day (TID) | ORAL | Status: DC | PRN

## 2012-10-31 MED ORDER — HYDROCODONE-ACETAMINOPHEN 5-325 MG PO TABS: 1.0000 | ORAL_TABLET | Freq: Four times a day (QID) | ORAL | Status: DC | PRN

## 2012-10-31 MED ORDER — ZOLPIDEM TARTRATE 5 MG PO TABS: 5.0000 mg | ORAL_TABLET | Freq: Every evening | ORAL | Status: DC | PRN

## 2012-10-31 NOTE — Progress Notes (Signed)
Subjective: 1 Day Post-Op Procedure(s) (LRB): OPEN REDUCTION INTERNAL FIXATION (ORIF) HUMERAL SHAFT FRACTURE (Right) Pain is ok -meds helping Activity level:  Stay in sling- wbat Diet tolerance:  ok Voiding:  ok Patient reports pain as 3 on 0-10 scale.    Objective: Vital signs in last 24 hours: Temp:  [97 F (36.1 C)-100 F (37.8 C)] 98.7 F (37.1 C) (04/30 0601) Pulse Rate:  [55-127] 106 (04/30 0752) Resp:  [14-23] 18 (04/30 0752) BP: (89-135)/(49-64) 100/50 mmHg (04/30 0601) SpO2:  [92 %-100 %] 96 % (04/30 0752) FiO2 (%):  [28 %] 28 % (04/30 0752)  Labs:  Recent Labs  10/29/12 1550  HGB 12.6    Recent Labs  10/29/12 1550  WBC 7.8  RBC 4.02  HCT 36.0  PLT 175    Recent Labs  10/29/12 1550 10/31/12 0535  NA 130* 127*  K 4.5 4.4  CL 94* 93*  CO2 29 28  BUN 12 14  CREATININE 0.52 0.67  GLUCOSE 79 138*  CALCIUM 9.6 8.4   No results found for this basename: LABPT, INR,  in the last 72 hours  Physical Exam:  Neurologically intact ABD soft Neurovascular intact Sensation intact distally Intact pulses distally No cellulitis present Compartment soft  Assessment/Plan:  1 Day Post-Op Procedure(s) (LRB): OPEN REDUCTION INTERNAL FIXATION (ORIF) HUMERAL SHAFT FRACTURE (Right) Advance diet Up with therapy Discharge to SNF if cleared by therapy Asa 81 mg qd and activity - scd's Dressing change  rto 2 weeks    Levina Boyack R 10/31/2012, 8:00 AM

## 2012-10-31 NOTE — Plan of Care (Signed)
Problem: Phase II Progression Outcomes Goal: Discharge plan established Recommend return to SNF for further therapy needs after acute care d/c, progress rehab of shoulder per MD

## 2012-10-31 NOTE — Clinical Social Work Psychosocial (Signed)
Clinical Social Work Department  BRIEF PSYCHOSOCIAL ASSESSMENT  Patient: [name]  Account Number: [id number]  Admit date: 10/30/12 Clinical Social Worker Sabino Niemann, MSW Date/Time: 10/31/2012 2:00 Pm Referred by: Physician Date Referred:  Referred for   SNF Placement   Other Referral:  Interview type: Patient's husband  Other interview type: PSYCHOSOCIAL DATA  Living Status:Husband Admitted from facility: [facility] Level of care: SNF Primary support name: [name] Primary support relationship to patient: Husband Degree of support available:  Strong and vested  CURRENT CONCERNS  Current Concerns   Post-Acute Placement   Other Concerns:  SOCIAL WORK ASSESSMENT / PLAN  CSW met with pt re: PT recommendation is to return to SNF.   Pt is currently at [facility] for ST-rehab      CSW completed FL2 and sent clinicals to [facility] and [facility].     Assessment/plan status: Information/Referral to [facility]  Other assessment/ plan:  Information/referral to community resources:  SNF     PATIENT'S/FAMILY'S RESPONSE TO PLAN OF CARE:  Patient's husband was agreeable to patient returning to facility.  Pt's husband verbalized  appreciation for CSW assist.   Sabino Niemann, MSW [phone]

## 2012-10-31 NOTE — Discharge Summary (Signed)
Patient ID: [name] MRN: [id number] DOB/AGE: 08-28-1940 72 y.o.  Admit date: 10/30/2012 Discharge date: 10/31/2012  Admission Diagnoses:  Principal Problem:   Closed fracture of right proximal humerus Stable pelvic fx  Discharge Diagnoses:  Same  Past Medical History  Diagnosis Date  . Peripheral vascular disease   . Subdural hematoma   . Ataxia   . Mild dysplasia of cervix   . UTI (lower urinary tract infection)   . Urinary incontinence   . Cerebrovascular disease   . Personal history of colonic polyps     adenomatous 1997 & tubular adenoma and hyplastic  2008  . Hyperlipidemia     takes Pravastatin daily  . H/O alcohol abuse   . Diverticulosis of colon (without mention of hemorrhage)   . Redundant colon   . C. difficile colitis   . Dementia t  . Ulcerative colitis     takes [place] daily  . Dysrhythmia     a fib-takes Diltiazem daily  . Seizures     takes Phenobarbital and Keppra daily;last seizure was April 1,2014  . Stroke 2000    off balance on right side a little and peripheral vision loss on right side  . Arthritis   . Joint pain   . Joint swelling   . History of UTI 10-2012  . Hypothyroidism     takes Levothyroxine daily  . Squamous cell carcinoma of mouth   . Adenocarcinoma, breast     bilateral  . Anxiety   . Dementia   . Cancer of larynx     Surgeries: Procedure(s): OPEN REDUCTION INTERNAL FIXATION (ORIF) HUMERAL SHAFT FRACTURE on 10/30/2012   Consultants:    Discharged Condition: Improved  Hospital Course: [name] is an 72 y.o. female who was admitted 10/30/2012 for operative treatment ofClosed fracture of right proximal humerus. Patient has severe unremitting pain that affects sleep, daily activities, and work/hobbies. After pre-op clearance the patient was taken to the operating room on 10/30/2012 and underwent  Procedure(s): OPEN REDUCTION INTERNAL FIXATION (ORIF) HUMERAL SHAFT FRACTURE.    Patient was given perioperative  antibiotics: Anti-infectives   Start     Dose/Rate Route Frequency Ordered Stop   10/30/12 1800  ceFAZolin (ANCEF) IVPB 2 g/50 mL premix     2 g 100 mL/hr over 30 Minutes Intravenous 4 times per day 10/30/12 1515 10/31/12 0115   10/30/12 0600  ceFAZolin (ANCEF) IVPB 2 g/50 mL premix     2 g 100 mL/hr over 30 Minutes Intravenous On call to O.R. 10/29/12 1413 10/30/12 1105       Patient was given sequential compression devices, early ambulation, and chemoprophylaxis to prevent DVT.  Patient benefited maximally from hospital stay and there were no complications.    Recent vital signs: Patient Vitals for the past 24 hrs:  BP Temp Temp src Pulse Resp SpO2  10/31/12 0752 - - - 106 18 96 %  10/31/12 0601 100/50 mmHg 98.7 F (37.1 C) - 91 16 95 %  10/31/12 0448 - - - 127 16 96 %  10/31/12 0226 123/59 mmHg 98.4 F (36.9 C) - 106 18 95 %  10/31/12 0206 - - - 114 18 95 %  10/30/12 2040 - - - 82 18 95 %  10/30/12 2030 100/56 mmHg 100 F (37.8 C) Oral 82 18 96 %  10/30/12 1545 107/61 mmHg 97 F (36.1 C) - - 14 94 %  10/30/12 1410 - 97.1 F (36.2 C) - 76 14 92 %  10/30/12 1403 89/57 mmHg - - 87 19 94 %  10/30/12 1400 - - - 81 17 92 %  10/30/12 1348 105/49 mmHg - - 85 20 92 %  10/30/12 1345 - - - 83 19 94 %  10/30/12 1333 99/51 mmHg - - 99 22 93 %  10/30/12 1330 - - - 86 19 95 %  10/30/12 1318 118/64 mmHg - - 91 22 96 %  10/30/12 1316 - 97.4 F (36.3 C) - 94 23 98 %  10/30/12 1021 135/58 mmHg - - 79 18 98 %  10/30/12 1020 - - - 86 18 99 %  10/30/12 1019 - - - 80 18 100 %  10/30/12 1015 108/62 mmHg - - 55 18 100 %  10/30/12 1013 - - - 64 18 96 %  10/30/12 0820 115/61 mmHg 97.3 F (36.3 C) Oral 82 18 96 %     Recent laboratory studies:  Recent Labs  10/29/12 1550 10/31/12 0535  WBC 7.8  --   HGB 12.6  --   HCT 36.0  --   PLT 175  --   NA 130* 127*  K 4.5 4.4  CL 94* 93*  CO2 29 28  BUN 12 14  CREATININE 0.52 0.67  GLUCOSE 79 138*  CALCIUM 9.6 8.4     Discharge  Medications:     Medication List    TAKE these medications       ALPRAZolam 0.5 MG tablet  Commonly known as:  XANAX  Take 1 tablet (0.5 mg total) by mouth 3 (three) times daily as needed for anxiety.     aspirin 81 MG chewable tablet  Chew 81 mg by mouth daily.     CALCIUM 500 + D PO  Take 1 tablet by mouth daily.     diltiazem 180 MG 24 hr capsule  Commonly known as:  CARDIZEM CD  Take 1 capsule (180 mg total) by mouth daily.     folic acid 1 MG tablet  Commonly known as:  FOLVITE  Take 1 tablet (1 mg total) by mouth daily.     HYDROcodone-acetaminophen 5-325 MG per tablet  Commonly known as:  NORCO  Take 1 tablet by mouth every 6 (six) hours as needed for pain.     levETIRAcetam 750 MG tablet  Commonly known as:  KEPPRA  Take 2 tablets (1,500 mg total) by mouth every 12 (twelve) hours.     levothyroxine 50 MCG tablet  Commonly known as:  SYNTHROID, LEVOTHROID  Take 1 tablet (50 mcg total) by mouth daily.     lidocaine 2 % solution  Commonly known as:  XYLOCAINE  Take 20 mLs by mouth every 3 (three) hours as needed. Oral pain     loperamide 2 MG capsule  Commonly known as:  IMODIUM  Take 2 mg by mouth 4 (four) times daily as needed for diarrhea or loose stools.     mesalamine 1.2 G EC tablet  Commonly known as:  LIALDA  Take 2 tablets (2.4 g total) by mouth 2 (two) times daily.     multivitamin with minerals Tabs  Take 1 tablet by mouth daily.     ondansetron 4 MG tablet  Commonly known as:  ZOFRAN  Take 1 tablet (4 mg total) by mouth every 6 (six) hours as needed for nausea.     PHENobarbital 97.2 MG tablet  Commonly known as:  LUMINAL  Take 97.2 mg by mouth daily.     pravastatin 40  MG tablet  Commonly known as:  PRAVACHOL  Take 1 tablet (40 mg total) by mouth every evening.     vitamin C 500 MG tablet  Commonly known as:  ASCORBIC ACID  Take 500 mg by mouth daily.     zolpidem 5 MG tablet  Commonly known as:  AMBIEN  Take 1 tablet (5 mg total)  by mouth at bedtime as needed for sleep.      Stay in sling. May change dressing as needed. May be wbat   Diagnostic Studies: Dg Chest 1 View  10/29/2012  *RADIOLOGY REPORT*  Clinical Data: Preop.  CHEST - 1 VIEW  Comparison: 03/18/2012.  Findings: Trachea is midline.  Heart size stable.  Lungs are hyperinflated but clear.  No pleural fluid.  Surgical clips are seen in the axillary regions bilaterally. Postoperative changes of right mastectomy.  Surgical clips are seen along the right paratracheal region as well.  IMPRESSION: No acute findings.   Original Report Authenticated By: Leanna Battles, M.D.    Dg Shoulder Right  10/02/2012  *RADIOLOGY REPORT*  Clinical Data: Fall.  RIGHT SHOULDER - 2+ VIEW  Comparison: None.  Findings: Comminuted fracture of the proximal right humerus involving the surgical neck and the greater tuberosity with slight separation of fracture fragments.  The humeral head fracture fragment remains aligned with the glenoid.  Prior axillary lymph node dissection.  IMPRESSION: Comminuted fracture proximal right humerus as noted above.   Original Report Authenticated By: Lacy Duverney, M.D.    Ct Head Wo Contrast  10/02/2012  *RADIOLOGY REPORT*  Clinical Data:  Fall, unresponsive, dementia  CT HEAD WITHOUT CONTRAST CT CERVICAL SPINE WITHOUT CONTRAST  Technique:  Multidetector CT imaging of the head and cervical spine was performed following the standard protocol without intravenous contrast.  Multiplanar CT image reconstructions of the cervical spine were also generated.  Comparison:  MRI brain dated 03/09/2012  CT HEAD  Findings: No evidence of parenchymal hemorrhage or extra-axial fluid collection. No mass lesion, mass effect, or midline shift.  No CT evidence of acute infarction.  Stable chronic changes involving the left parieto-occipital region with encephalomalacic changes, ex vacuo dilatation of the left lateral ventricle, and overlying craniotomy.  Subcortical white matter and  periventricular small vessel ischemic changes.  Intracranial atherosclerosis.  Global cortical atrophy.  Stable ventricular prominence.  The visualized paranasal sinuses are essentially clear. The mastoid air cells are unopacified.  No evidence of calvarial fracture.  IMPRESSION: No evidence of acute intracranial abnormality.  Stable encephalomalacic/postsurgical changes on the left.  CT CERVICAL SPINE  Findings: Exaggerated cervical lordosis.  No evidence of fracture or dislocation.  Vertebral body heights are maintained.  The dens appears intact.  No prevertebral soft tissue swelling.  Moderate multilevel degenerative changes.  Surgical clips in the neck.  Prior tracheostomy.  Lung apices are notable for mild paraseptal emphysematous changes.  IMPRESSION:  No evidence of traumatic injury to the cervical spine.  Moderate multilevel degenerative changes.   Original Report Authenticated By: Charline Bills, M.D.    Ct Cervical Spine Wo Contrast  10/02/2012  *RADIOLOGY REPORT*  Clinical Data:  Fall, unresponsive, dementia  CT HEAD WITHOUT CONTRAST CT CERVICAL SPINE WITHOUT CONTRAST  Technique:  Multidetector CT imaging of the head and cervical spine was performed following the standard protocol without intravenous contrast.  Multiplanar CT image reconstructions of the cervical spine were also generated.  Comparison:  MRI brain dated 03/09/2012  CT HEAD  Findings: No evidence of parenchymal hemorrhage or extra-axial fluid  collection. No mass lesion, mass effect, or midline shift.  No CT evidence of acute infarction.  Stable chronic changes involving the left parieto-occipital region with encephalomalacic changes, ex vacuo dilatation of the left lateral ventricle, and overlying craniotomy.  Subcortical white matter and periventricular small vessel ischemic changes.  Intracranial atherosclerosis.  Global cortical atrophy.  Stable ventricular prominence.  The visualized paranasal sinuses are essentially clear. The  mastoid air cells are unopacified.  No evidence of calvarial fracture.  IMPRESSION: No evidence of acute intracranial abnormality.  Stable encephalomalacic/postsurgical changes on the left.  CT CERVICAL SPINE  Findings: Exaggerated cervical lordosis.  No evidence of fracture or dislocation.  Vertebral body heights are maintained.  The dens appears intact.  No prevertebral soft tissue swelling.  Moderate multilevel degenerative changes.  Surgical clips in the neck.  Prior tracheostomy.  Lung apices are notable for mild paraseptal emphysematous changes.  IMPRESSION:  No evidence of traumatic injury to the cervical spine.  Moderate multilevel degenerative changes.   Original Report Authenticated By: Charline Bills, M.D.    Ct Hip Right Wo Contrast  10/02/2012  *RADIOLOGY REPORT*  Clinical Data: Right hip pain, fall  CT OF THE RIGHT HIP WITHOUT CONTRAST  Technique:  Multidetector CT imaging was performed according to the standard protocol. Multiplanar CT image reconstructions were also generated.  Comparison: None.  Findings: Status post ORIF of a prior right hip fracture.  No evidence of hardware complication.  No acute right hip fracture is seen.  Mildly comminuted fracture of the right superior pubic ramus (series 2/images 31 and 34).  Nondisplaced fracture of the right inferior pubic ramus (series 2/image 42).  Healing fracture of the left inferior pubic ramus (series 2/image 43).  Visualized soft tissue pelvis is notable for vascular calcifications and a thick-walled bladder.  IMPRESSION: Right superior/inferior pubic rami fractures, as described above.  Healing fracture of the left inferior pubic ramus.  Status post ORIF of a prior right hip fracture.   Original Report Authenticated By: Charline Bills, M.D.    Dg Humerus Right  10/30/2012  *RADIOLOGY REPORT*  Clinical Data: ORIF right humerus.  RIGHT HUMERUS - 2+ VIEW  Comparison: 10/02/2012  Findings: Multiple intraoperative spot images demonstrate  lateral plate and screw fixation across the right humeral neck fracture. Near anatomic alignment.  No hardware complicating feature.  IMPRESSION: Internal fixation of the right humeral neck fracture.  No complicating feature.   Original Report Authenticated By: Charlett Nose, M.D.     Disposition: 03-Skilled Nursing Facility      Discharge Orders   Future Appointments Provider Department Dept Phone   01/14/2013 3:30 PM York Spaniel, MD [facility] [phone]   02/04/2013 2:30 PM Jacques Navy, MD [facility] [phone]   Future Orders Complete By Expires     Apply dressing  As directed     Comments:      miplex    Call MD / Call 911  As directed     Comments:      If you experience chest pain or shortness of breath, CALL 911 and be transported to the hospital emergency room.  If you develope a fever above 101 F, pus (white drainage) or increased drainage or redness at the wound, or calf pain, call your surgeon's office.    Constipation Prevention  As directed     Comments:      Drink plenty of fluids.  Prune juice may be helpful.  You may use a  stool softener, such as Colace (over the counter) 100 mg twice a day.  Use MiraLax (over the counter) for constipation as needed.    Diet - low sodium heart healthy  As directed     Increase activity slowly as tolerated  As directed        Follow-up Information   Follow up with Velna Ochs, MD. Call in 12 days.   Contact information:   [address] [place] [address] [phone]        Signed: Prince Rome 10/31/2012, 8:08 AM

## 2012-10-31 NOTE — Op Note (Signed)
[name], [name]            ACCOUNT NO.:  [id number]  MEDICAL RECORD NO.:  [id number]  LOCATION:  [id number]                        FACILITY:  [facility]  PHYSICIAN:  Lubertha Basque. Ronella Plunk, M.D.DATE OF BIRTH:  [birth year]  DATE OF PROCEDURE:  10/30/2012 DATE OF DISCHARGE:                              OPERATIVE REPORT   PREOPERATIVE DIAGNOSIS:  Right proximal humerus fracture.  POSTOPERATIVE DIAGNOSIS:  Right proximal humerus fracture.  PROCEDURE:  Right proximal humerus open reduction and internal fixation.  ANESTHESIA:  General and block.  ATTENDING SURGEON:  Lubertha Basque. Jerl Santos, M.D.  ASSISTANT:  Jones Broom, MD  INDICATION FOR PROCEDURE:  The patient is a 72 year old woman who broke her shoulder 3 or 4 weeks ago.  We attempted to treat her in a closed fashion as she does have some significant medical comorbidities. Unfortunately, she persisted with disabling pain.  She told us she could not live with this any longer and she was offered ORIF.  Informed operative consent was obtained after discussion of possible complications including reaction to anesthesia, infection, neurovascular injury, and death.  SUMMARY OF FINDINGS AND PROCEDURE:  Under general anesthesia and a block through a deltopectoral approach, we exposed a significantly displaced 3- part proximal humerus fracture.  This was reduced with some moderate difficulty followed by stabilization with a S3 plate from [employer].  Jackquline Bosch assisted throughout and was invaluable to the completion of the case in that he helped maintain reduction and exposure while hardware was placed.  He also closed simultaneously to help minimize OR time.  We used fluoroscopy throughout the case to make appropriate intraoperative decisions and read all of these views ourselves.  She was closed, primarily admitted for appropriate postop care.  DESCRIPTION OF PROCEDURE:  The patient was taken to the operating suite where general  anesthetic was applied without difficulty.  She was also given a block in the pre-anesthesia area.  She was positioned in beach- chair position and prepped draped in normal sterile fashion.  After the administration of IV Kefzol and an appropriate time-out, a deltopectoral approach was taken to the right shoulder.  She did not have much of a cephalic vein.  The deltopectoral interval was easily found and utilized.  As mentioned above, she had a 3-part fracture with the greater tuberosity slightly detached.  We placed some stay sutures in the rotator cuff near the lesser and the greater tuberosities for control. We then split up the interval and found the biceps tendon.  This was released at the level of the glenoid and excised.  With some moderate dissection and difficulty, we were able to free up the distal portion of the humerus and get this positioned under the humeral head.  We used fluoroscopy to confirm adequate reduction.  We then placed the S3 plate in appropriate position.  A guidewire was placed up in the head and was found to be central on two views.  We then secured it to the distal portion in the sliding hole.  Some additional positioning and reduction was required, but eventually, we obtained good position of the plate and bone fragments.  We then used all but one of the distal smooth pegs and we used a  total of three proximal screws two with bicortical purchase and one placed in a locking fashion.  Again, fluoroscopy was used to confirm adequate reduction of the fracture and placement of hardware.   We placed our fingers into the glenohumeral joint and no penetration of the humeral head was felt.  The tuberosities were also sewn to the plate with the nonabsorbable  sutures placed earlier for further stability. The wound was then irrigated followed  by reapproximation of deep tissues with 0 and 2-0 undyed Vicryl followed by skin  Closure with staples.  Adaptic was applied  followed by dry gauze and tape. Estimated blood loss and intraoperative fluids obtained from anesthesia records.  DISPOSITION:  The patient was extubated in the operating room and taken to recovery room in stable condition.  She was to be admitted to the [facility] for appropriate postop care to include perioperative antibiotics and mechanical means for DVT prophylaxis.     Lubertha Basque Jerl Santos, M.D.     PGD/MEDQ  D:  10/30/2012  T:  10/31/2012  Job:  [id number]

## 2012-10-31 NOTE — Progress Notes (Signed)
Clinical social worker assisted with patient discharge to skilled nursing facility, [facility].  CSW addressed all family questions and concerns. CSW copied chart and added all important documents.  Clinical Social Worker will sign off for now as social work intervention is no longer needed.   Sabino Niemann, MSW,  [phone]

## 2012-10-31 NOTE — Progress Notes (Signed)
Physical Therapy Evaluation Patient Details Name: [name] MRN: [id number] DOB: April 25, 1941 Today's Date: 10/31/2012 Time: 0865-7846 PT Time Calculation (min): 20 min  PT Assessment / Plan / Recommendation Clinical Impression  72 yo female admitted for ORIF of R proximal humerus fracture; Presents with decr functional mobility ; Will benefit from continued rehab at SNF post discharge, will likely dc today    PT Assessment  All further PT needs can be met in the next venue of care    Follow Up Recommendations  SNF    Does the patient have the potential to tolerate intense rehabilitation      Barriers to Discharge None      Equipment Recommendations  None recommended by PT    Recommendations for Other Services OT consult (as ordered)   Frequency      Precautions / Restrictions Precautions Precautions: Shoulder Type of Shoulder Precautions: wear sling at all times except during, ROM exercises (elbow, wrist, hand only) bathing and dressing Precaution Booklet Issued: Yes (comment) Required Braces or Orthoses: Other Brace/Splint Other Brace/Splint: sling R UE Restrictions Weight Bearing Restrictions: Yes RUE Weight Bearing: Non weight bearing Other Position/Activity Restrictions: pillow behind R UE when in bed/sitting up in recliner   Pertinent Vitals/Pain 8-9/10 RN notified, and gave pain meds      Mobility  Bed Mobility Bed Mobility: Supine to Sit Supine to Sit: 1: +2 Total assist Supine to Sit: Patient Percentage: 30% Details for Bed Mobility Assistance: Verbal and tactile cues for technique; needed extensive assist elevating trunk from bed largely due to R shoulder pain Transfers Transfers: Sit to Stand;Stand to Sit Sit to Stand: 3: Mod assist;With upper extremity assist Stand to Sit: 3: Mod assist;To chair/3-in-1 Details for Transfer Assistance: cues to initiate and for technique; physical assist for initial lift from bed and to control descent with stand  to sit Ambulation/Gait Ambulation/Gait Assistance: 4: Min assist Ambulation Distance (Feet): 3 Feet (pivot steps bed to chair) Assistive device: 2 person hand held assist Ambulation/Gait Assistance Details: distance limited by pain (which limited activity tolerance); Small steps    Exercises     PT Diagnosis: Acute pain;Difficulty walking  PT Problem List: Decreased strength;Decreased range of motion;Decreased activity tolerance;Decreased balance;Decreased mobility;Decreased coordination;Decreased knowledge of use of DME;Decreased knowledge of precautions;Pain PT Treatment Interventions:     PT Goals    Visit Information  Last PT Received On: 10/31/12 Assistance Needed: +2 (mostly for equipment, O2 to mobilize)    Subjective Data  Subjective: "Please hurry" re: getting up as she was in a lot of pain; Pt also wanting her husband to come (this therapist called him   Prior Functioning  Home Living Lives With: Spouse Type of Home: Skilled Nursing Facility Prior Function Level of Independence: Needs assistance Driving: No Vocation: Retired Musician: Teacher, adult education Dominant Hand: Right    Cognition  Cognition Arousal/Alertness: Awake/alert Behavior During Therapy: WFL for tasks assessed/performed Overall Cognitive Status: Within Functional Limits for tasks assessed    Extremity/Trunk Assessment Right Upper Extremity Assessment RUE ROM/Strength/Tone: Deficits;Unable to fully assess;Due to pain;Due to precautions RUE ROM/Strength/Tone Deficits: R shoulder fracture, R UE in sling with dressing. ROM exercises with elbow, wrist hand only. R hand edematous RUE Sensation: Deficits RUE Sensation Deficits: light touch RUE Coordination: Deficits RUE Coordination Deficits: NWB, no lifting allowed Left Upper Extremity Assessment LUE ROM/Strength/Tone: WFL for tasks assessed LUE Sensation: WFL - Light Touch LUE Coordination: WFL - gross/fine motor Right Lower  Extremity Assessment RLE ROM/Strength/Tone: Deficits RLE  ROM/Strength/Tone Deficits: Noted some generalized weakness, requiring physical assist for sit to stand Left Lower Extremity Assessment LLE ROM/Strength/Tone: Deficits LLE ROM/Strength/Tone Deficits: Same as R   Balance    End of Session PT - End of Session Equipment Utilized During Treatment: Gait belt (sling RUE) Activity Tolerance: Patient limited by pain Patient left: in chair;with call bell/phone within reach Nurse Communication: Mobility status  GP Functional Assessment Tool Used: Clinical Judgement Functional Limitation: Mobility: Walking and moving around Mobility: Walking and Moving Around Current Status ([id number]): At least 60 percent but less than 80 percent impaired, limited or restricted Mobility: Walking and Moving Around Goal Status [phone]): At least 1 percent but less than 20 percent impaired, limited or restricted Mobility: Walking and Moving Around Discharge Status [phone]): At least 60 percent but less than 80 percent impaired, limited or restricted   Van Clines [facility] [place], [place] [phone]  10/31/2012, 1:17 PM

## 2012-10-31 NOTE — Progress Notes (Signed)
Patient discharged via wheelchair to [facility] in stable condition.

## 2012-10-31 NOTE — Anesthesia Postprocedure Evaluation (Signed)
Anesthesia Post Note  Patient: [name]  Procedure(s) Performed: Procedure(s) (LRB): OPEN REDUCTION INTERNAL FIXATION (ORIF) HUMERAL SHAFT FRACTURE (Right)  Anesthesia type: general  Patient location: PACU  Post pain: Pain level controlled  Post assessment: Patient's Cardiovascular Status Stable  Last Vitals:  Filed Vitals:   10/31/12 0752  BP:   Pulse: 106  Temp:   Resp: 18    Post vital signs: Reviewed and stable  Level of consciousness: sedated  Complications: No apparent anesthesia complications

## 2012-10-31 NOTE — Evaluation (Signed)
Occupational Therapy Evaluation Patient Details Name: AKIRAH STORCK MRN: [id number] DOB: 01/12/1941 Today's Date: 10/31/2012 Time: 4098-1191 OT Time Calculation (min): 39 min  OT Assessment / Plan / Recommendation Clinical Impression  Pt demos decline in function with ADLs and ADL mobility following R proximal humeral fracture, pt underwent ORIF. Pt wearing sling and has NWB restrictions. Pt will require assist with all ADLs, no further acute OT services required at thsi time and further OT needs can be met at [facility]. All education completed    OT Assessment  All further OT needs can be met in the next venue of care;Progress rehab of shoulder as ordered by MD at follow-up appointment    Follow Up Recommendations  SNF    Barriers to Discharge  none    Equipment Recommendations  None recommended by OT    Recommendations for Other Services    Frequency       Precautions / Restrictions Precautions Precautions: Shoulder Type of Shoulder Precautions: wear sling at all times except during, ROM exercises (elbow, wrist, hand only) bathing and dressing Precaution Booklet Issued: Yes (comment) Required Braces or Orthoses: Other Brace/Splint Other Brace/Splint: sling R UE Restrictions Weight Bearing Restrictions: Yes RUE Weight Bearing: Non weight bearing Other Position/Activity Restrictions: Pt and her husband educated on positioning of R UE with pillow behind R UE when in bed/sitting up in recliner       ADL  Grooming: Performed;Minimal assistance;Wash/dry hands;Wash/dry face Where Assessed - Grooming: Supported sitting Upper Body Bathing: +1 Total assistance Lower Body Bathing: +1 Total assistance Upper Body Dressing: +1 Total assistance Lower Body Dressing: +1 Total assistance Toilet Transfer: Simulated;Moderate assistance Toilet Transfer Method: Sit to stand Toileting - Clothing Manipulation and Hygiene: +1 Total assistance Where Assessed - Toileting Clothing Manipulation and  Hygiene: Standing ADL Comments: pt and her husband educated on bathing and dressing techniques for R UE    OT Diagnosis: Acute pain;Generalized weakness  OT Problem List: Decreased range of motion;Decreased coordination;Increased edema;Pain;Impaired UE functional use;Impaired sensation;Decreased activity tolerance OT Treatment Interventions:     OT Goals   Visit Information  Last OT Received On: 10/31/12 Assistance Needed: +2 (mostly for equipment, O2 to mobilize)    Subjective Data  Subjective: " I didn't know what I was supposed to do or not supposed to do " Patient Stated Goal: To return home after therpay at SNF   Prior Functioning     Home Living Lives With: Spouse Type of Home: Skilled Nursing Facility Prior Function Level of Independence: Needs assistance Driving: No Vocation: Retired Musician: Teacher, adult education Dominant Hand: Right         Vision/Perception Vision - History Baseline Vision: Wears glasses all the time Patient Visual Report: No change from baseline Perception Perception: Within Functional Limits   Cognition  Cognition Arousal/Alertness: Awake/alert Behavior During Therapy: WFL for tasks assessed/performed Overall Cognitive Status: Within Functional Limits for tasks assessed    Extremity/Trunk Assessment Right Upper Extremity Assessment RUE ROM/Strength/Tone: Deficits;Unable to fully assess;Due to pain;Due to precautions RUE ROM/Strength/Tone Deficits: R shoulder fracture, R UE in sling with dressing. ROM exercises with elbow, wrist hand only. R hand edematous RUE Sensation: Deficits RUE Sensation Deficits: light touch RUE Coordination: Deficits RUE Coordination Deficits: NWB, no lifting allowed Left Upper Extremity Assessment LUE ROM/Strength/Tone: WFL for tasks assessed LUE Sensation: WFL - Light Touch LUE Coordination: WFL - gross/fine motor   Mobility Bed Mobility Bed Mobility: Not  assessed Transfers Transfers: Sit to Stand;Stand to Sit Sit to Stand:  3: Mod assist;With upper extremity assist Stand to Sit: 3: Mod assist;To chair/3-in-1 Details for Transfer Assistance: cues to initiate and for technique; physical assist for initial lift from bed and to control descent with stand to sit     Exercise Donning/doffing shirt without moving shoulder: Caregiver independent with task;Patient able to independently direct caregiver Method for sponge bathing under operated UE: Caregiver independent with task;Patient able to independently direct caregiver Donning/doffing sling/immobilizer: Caregiver independent with task;Patient able to independently direct caregiver Correct positioning of sling/immobilizer: Caregiver independent with task;Patient able to independently direct caregiver ROM for elbow, wrist and digits of operated UE: Caregiver independent with task;Patient able to independently direct caregiver;Minimal assistance Sling wearing schedule (on at all times/off for ADL's): Caregiver independent with task;Patient able to independently direct caregiver Positioning of UE while sleeping: Caregiver independent with task;Patient able to independently direct caregiver   Balance Balance Balance Assessed: No   End of Session OT - End of Session Equipment Utilized During Treatment: Gait belt Activity Tolerance: Patient limited by pain;Patient limited by fatigue Patient left: in chair;with call bell/phone within reach;with family/visitor present  GO Functional Limitation: Self care Self Care Current Status (H0865): At least 80 percent but less than 100 percent impaired, limited or restricted Self Care Goal Status (H8469): At least 80 percent but less than 100 percent impaired, limited or restricted Self Care Discharge Status [phone]): At least 80 percent but less than 100 percent impaired, limited or restricted   Galen Manila 10/31/2012, 1:25 PM

## 2012-11-15 ENCOUNTER — Encounter: Payer: Self-pay | Admitting: *Deleted

## 2012-11-15 NOTE — Telephone Encounter (Signed)
Opened in error

## 2013-01-09 ENCOUNTER — Encounter: Payer: Self-pay | Admitting: Internal Medicine

## 2013-01-09 ENCOUNTER — Ambulatory Visit (INDEPENDENT_AMBULATORY_CARE_PROVIDER_SITE_OTHER): Payer: Medicare Other | Admitting: Internal Medicine

## 2013-01-09 VITALS — BP 142/70 | HR 82 | Temp 97.6°F | Resp 16 | Wt 128.4 lb

## 2013-01-09 DIAGNOSIS — I480 Paroxysmal atrial fibrillation: Secondary | ICD-10-CM

## 2013-01-09 DIAGNOSIS — C329 Malignant neoplasm of larynx, unspecified: Secondary | ICD-10-CM

## 2013-01-09 DIAGNOSIS — S42201S Unspecified fracture of upper end of right humerus, sequela: Secondary | ICD-10-CM

## 2013-01-09 DIAGNOSIS — R569 Unspecified convulsions: Secondary | ICD-10-CM

## 2013-01-09 DIAGNOSIS — S72001S Fracture of unspecified part of neck of right femur, sequela: Secondary | ICD-10-CM

## 2013-01-09 DIAGNOSIS — I482 Chronic atrial fibrillation, unspecified: Secondary | ICD-10-CM | POA: Insufficient documentation

## 2013-01-09 DIAGNOSIS — I4891 Unspecified atrial fibrillation: Secondary | ICD-10-CM

## 2013-01-09 DIAGNOSIS — S42309S Unspecified fracture of shaft of humerus, unspecified arm, sequela: Secondary | ICD-10-CM

## 2013-01-09 DIAGNOSIS — S72009S Fracture of unspecified part of neck of unspecified femur, sequela: Secondary | ICD-10-CM

## 2013-01-09 DIAGNOSIS — K5190 Ulcerative colitis, unspecified, without complications: Secondary | ICD-10-CM

## 2013-01-09 DIAGNOSIS — I10 Essential (primary) hypertension: Secondary | ICD-10-CM

## 2013-01-09 MED ORDER — MESALAMINE 1.2 G PO TBEC: 2.4000 g | DELAYED_RELEASE_TABLET | Freq: Two times a day (BID) | ORAL | Status: DC

## 2013-01-09 MED ORDER — LEVETIRACETAM 750 MG PO TABS: 1500.0000 mg | ORAL_TABLET | Freq: Two times a day (BID) | ORAL | Status: DC

## 2013-01-09 NOTE — Patient Instructions (Addendum)
Doing well all things considered.  We got your medication list reconciled.  Lungs are clear today and oxygen saturation is 94%  Heart - a. Fib today but rate controlled.   Keep up the good work with rehab and giving Him hell.   See you soon.

## 2013-01-09 NOTE — Progress Notes (Signed)
  Subjective:    Patient ID: [name], female    DOB: [birth year], 72 y.o.   MRN: [id number]  HPI [name] presents for first follow up since discharge from [facility] June 20th. She had been admitted to [facility] April 1-5 for supereior and inferior pubic rami fractures and proximal right humerus fractures. Hospital records reviewed. She returned to hospital April 26-27th for ORIF proximal humeral fracture. Since being home she has been doing fine. She is having home PT/OT from [facility] twice a week. During her stay at [facility] she did have a serious bronchitis treated by Dr. Evlyn Kanner with a good recovery.   Reviewed all meds to be sure they are correct after hospitalizations and SNF.  She is current with Dr. Anne Hahn for seizure disorder and will be seeing Dr. Manson Passey for stoma and mouth management post-laryngeal Cancer.  Review of Systems System review is negative for any constitutional, cardiac, pulmonary, GI or neuro symptoms or complaints other than as described in the HPI.     Objective:   Physical Exam Filed Vitals:   01/09/13 1459  BP: 142/70  Pulse: 82  Temp: 97.6 F (36.4 C)  Resp: 16   gen'l - chronically ill appearing white woman in no distress HEENT- C&S clear, no oral lesions Neck - stoma midline, capped Cor - IRIR rate controlled, no murmur Pulm - CTAP Neuro - Alert & oriented.        Assessment & Plan:

## 2013-01-12 NOTE — Assessment & Plan Note (Signed)
Stable rate at today's exam.  Plan Continue CCB for rate control  ASA for stroke prevention.

## 2013-01-12 NOTE — Assessment & Plan Note (Signed)
No seizures since last hospitalization.  Plan Continue present medications  Follow up with neurology as instructed.

## 2013-01-12 NOTE — Assessment & Plan Note (Signed)
Patient had new fracture superior and inferior ramus right with fall. No surgical intervention required. At todays visit she is weight bearing using a folding quad-cane.

## 2013-01-12 NOTE — Assessment & Plan Note (Signed)
S/p delayed repair of proximal right humeral fracture. She is doing OK but cannot fully use the right arm.  Plan Follow up with Dr. Jerl Santos as instructed.

## 2013-01-12 NOTE — Assessment & Plan Note (Signed)
Currently stable and doing well

## 2013-01-12 NOTE — Assessment & Plan Note (Signed)
BP Readings from Last 3 Encounters:  01/09/13 142/70  10/31/12 100/50  10/31/12 100/50   OK control.

## 2013-01-12 NOTE — Assessment & Plan Note (Signed)
Seems stable. Able to communicate with electronic voice box. To see Dr. Manson Passey at [facility] for follow up.

## 2013-01-14 ENCOUNTER — Encounter: Payer: Self-pay | Admitting: Neurology

## 2013-01-14 ENCOUNTER — Ambulatory Visit (INDEPENDENT_AMBULATORY_CARE_PROVIDER_SITE_OTHER): Payer: Medicare Other | Admitting: Neurology

## 2013-01-14 VITALS — BP 114/65 | HR 88 | Ht 65.0 in | Wt 114.0 lb

## 2013-01-14 DIAGNOSIS — R269 Unspecified abnormalities of gait and mobility: Secondary | ICD-10-CM

## 2013-01-14 DIAGNOSIS — Z8669 Personal history of other diseases of the nervous system and sense organs: Secondary | ICD-10-CM

## 2013-01-14 DIAGNOSIS — R569 Unspecified convulsions: Secondary | ICD-10-CM

## 2013-01-14 DIAGNOSIS — I629 Nontraumatic intracranial hemorrhage, unspecified: Secondary | ICD-10-CM

## 2013-01-14 DIAGNOSIS — Z5181 Encounter for therapeutic drug level monitoring: Secondary | ICD-10-CM

## 2013-01-14 NOTE — Progress Notes (Signed)
Reason for visit: Seizures  [name] is an 72 y.o. female  History of present illness:  [name] is a 72 year old left-handed white female with a history of an intracranial hemorrhage and a history of subsequent seizures. The patient is on phenobarbital and Keppra. The patient had a probable seizure event in the beginning of April 2014. The patient fell and fractured her right humerus and right pelvis. The patient required surgery on her right shoulder, and she has just recently completed physical therapy. The patient has not had any further seizure events. The patient has chronic hyponatremia that has been present for least one year. The patient drinks greater than 1 gallon of iced tea daily. The patient is tolerating medications fairly well. The patient is now walking with a cane, and she has not had any further falls. The patient does have chronic gait instability.  Past Medical History  Diagnosis Date  . Peripheral vascular disease   . Subdural hematoma   . Ataxia   . Mild dysplasia of cervix   . UTI (lower urinary tract infection)   . Urinary incontinence   . Cerebrovascular disease   . Personal history of colonic polyps     adenomatous 1997 & tubular adenoma and hyplastic  2008  . Hyperlipidemia     takes Pravastatin daily  . H/O alcohol abuse   . Diverticulosis of colon (without mention of hemorrhage)   . Redundant colon   . C. difficile colitis   . Dementia t  . Ulcerative colitis     takes [place] daily  . Dysrhythmia     a fib-takes Diltiazem daily  . Seizures     takes Phenobarbital and Keppra daily;last seizure was April 1,2014  . Stroke 2000    off balance on right side a little and peripheral vision loss on right side  . Arthritis   . Joint pain   . Joint swelling   . History of UTI 10-2012  . Hypothyroidism     takes Levothyroxine daily  . Squamous cell carcinoma of mouth   . Adenocarcinoma, breast     bilateral  . Anxiety   . Dementia   .  Cancer of larynx   . Hypertension   . Atrial fibrillation   . History of breast cancer   . History of seizures   . Gait disorder   . Oropharyngeal cancer   . History of colitis     Past Surgical History  Procedure Laterality Date  . Subdural hematoma evacuation via craniotomy    . Oral surgery for squamous cell carcinoma of the mouth      x 3  . Multiple tooth extractions      due to oral cancer  . Cataract extraction, bilateral    . Breast lumpectomy      left breast with radiation therapy  . Carotid endarterectomy      left  . Mastectomy      Right, history with nodule dissection  . Orif hip fracture  Sept '12    Right hip: screw and plate repair.  . Larynx surgery  08/2010    [facility] x 2  . Tubal ligation    . Colonoscopy    . Orif humerus fracture Right 10/30/2012    Procedure: OPEN REDUCTION INTERNAL FIXATION (ORIF) HUMERAL SHAFT FRACTURE;  Surgeon: Velna Ochs, MD;  Location: [facility] OR;  Service: Orthopedics;  Laterality: Right;  BIG C-ARM, SHOULDER POSITION    Family History  Problem  Relation Age of Onset  . Coronary artery disease Mother   . Heart disease Mother   . Diabetes Sister   . Breast cancer Maternal Aunt   . Breast cancer Sister     Social history:  reports that she quit smoking about 8 years ago. She has never used smokeless tobacco. She reports that she drinks about 4.2 ounces of alcohol per week. She reports that she does not use illicit drugs.  Allergies: No Known Allergies  Medications:  Current Outpatient Prescriptions on File Prior to Visit  Medication Sig Dispense Refill  . Calcium Carbonate-Vitamin D (CALCIUM 500 + D PO) Take 1 tablet by mouth daily.      Marland Kitchen diltiazem (CARDIZEM CD) 180 MG 24 hr capsule Take 1 capsule (180 mg total) by mouth daily.  90 capsule  3  . folic acid (FOLVITE) 1 MG tablet Take 1 tablet (1 mg total) by mouth daily.  90 tablet  1  . levETIRAcetam (KEPPRA) 750 MG tablet Take 2 tablets (1,500 mg total) by mouth every  12 (twelve) hours.  360 tablet  3  . levothyroxine (SYNTHROID, LEVOTHROID) 50 MCG tablet Take 1 tablet (50 mcg total) by mouth daily.  90 tablet  0  . lidocaine (XYLOCAINE) 2 % solution Take 20 mLs by mouth every 3 (three) hours as needed. Oral pain      . mesalamine (LIALDA) 1.2 G EC tablet Take 2 tablets (2.4 g total) by mouth 2 (two) times daily.  360 tablet  3  . Multiple Vitamin (MULTIVITAMIN WITH MINERALS) TABS Take 1 tablet by mouth daily.      Marland Kitchen PHENobarbital (LUMINAL) 97.2 MG tablet Take 97.2 mg by mouth daily.      . pravastatin (PRAVACHOL) 40 MG tablet Take 1 tablet (40 mg total) by mouth every evening.  90 tablet  0  . vitamin C (ASCORBIC ACID) 500 MG tablet Take 500 mg by mouth daily.       No current facility-administered medications on file prior to visit.    ROS:  Out of a complete 14 system review of symptoms, the patient complains only of the following symptoms, and all other reviewed systems are negative.  Seizures Gait disorder Urinary incontinence  Blood pressure 114/65, pulse 88, height 5\' 5"  (1.651 m), weight 114 lb (51.71 kg).  Physical Exam  General: The patient is alert and cooperative at the time of the examination.  Skin: 1+ edema at the ankles is noted bilaterally.   Neurologic Exam  Cranial nerves: Facial symmetry is present. Speech is generated via an electro-larynx. Extraocular movements are full. Visual fields are full.  Motor: The patient has good strength in all 4 extremities.  Coordination: The patient has good finger-nose-finger and heel-to-shin bilaterally.  Gait and station: The patient has wide-based, circumduction gait with the right leg. The patient uses a cane for ambulation. Tandem gait was not attempted. Gait is unstable. Romberg is negative. No drift is seen.  Reflexes: Deep tendon reflexes are symmetric.   Assessment/Plan:  One. History of chronic seizure disorder with recent recurrence  2. History of cerebrovascular  disease, left intracranial hemorrhage  3. Hyponatremia  The patient will continue her current dose of Keppra taking 1500 mg twice daily. The patient is on 100 mg of phenobarbital at night. The patient will followup in about 6 months. The patient does not operate a motor vehicle. The patient has hyponatremia that is chronic in nature, and she needs to restrict fluid intake to less than  1000 cc daily. This will need to be followed. The most recent sodium level in April 2014 was 127.  Marlan Palau MD 01/14/2013 7:15 PM  [facility] [address] [place], [place] [address]  Phone [phone] Fax [phone]

## 2013-01-28 ENCOUNTER — Telehealth: Payer: Self-pay | Admitting: Gastroenterology

## 2013-01-28 MED ORDER — MESALAMINE 1.2 G PO TBEC: 2.4000 g | DELAYED_RELEASE_TABLET | Freq: Two times a day (BID) | ORAL | Status: DC

## 2013-01-28 NOTE — Telephone Encounter (Signed)
Husband and I had a long discussion about whether pt has CDIFF and she has had it twice, so he knows what to look for. Informed him he can use Imodium OTC per box and he will call if diarrhea worsens. Sample of Lialda left at the front desk.

## 2013-01-28 NOTE — Telephone Encounter (Signed)
Pt fell in April and broke several bones leading to 2.5 months in Rehab; she is able to ambulate with a cane now. She remains on Lialda 1.2 G 2 tabs bid. Pt's diarrhea is slowly beginning to increase again; can she just take anti diarrheals? Thanks.

## 2013-01-28 NOTE — Telephone Encounter (Signed)
If diarrhea has increased fairly suddenly, any recent antibiotic exposures, then she should have a C. difficile stool test Otherwise continue Lilada 4.8 g daily and she can use loperamide per box instructions

## 2013-02-04 ENCOUNTER — Ambulatory Visit (INDEPENDENT_AMBULATORY_CARE_PROVIDER_SITE_OTHER): Payer: Medicare Other | Admitting: Internal Medicine

## 2013-02-04 ENCOUNTER — Encounter: Payer: Self-pay | Admitting: Internal Medicine

## 2013-02-04 ENCOUNTER — Other Ambulatory Visit (INDEPENDENT_AMBULATORY_CARE_PROVIDER_SITE_OTHER): Payer: Medicare Other

## 2013-02-04 VITALS — BP 130/68 | HR 93 | Temp 97.9°F | Ht 65.0 in | Wt 127.6 lb

## 2013-02-04 DIAGNOSIS — I4891 Unspecified atrial fibrillation: Secondary | ICD-10-CM

## 2013-02-04 DIAGNOSIS — IMO0002 Reserved for concepts with insufficient information to code with codable children: Secondary | ICD-10-CM

## 2013-02-04 DIAGNOSIS — S42309S Unspecified fracture of shaft of humerus, unspecified arm, sequela: Secondary | ICD-10-CM

## 2013-02-04 DIAGNOSIS — I480 Paroxysmal atrial fibrillation: Secondary | ICD-10-CM

## 2013-02-04 DIAGNOSIS — E039 Hypothyroidism, unspecified: Secondary | ICD-10-CM

## 2013-02-04 DIAGNOSIS — R569 Unspecified convulsions: Secondary | ICD-10-CM

## 2013-02-04 DIAGNOSIS — E871 Hypo-osmolality and hyponatremia: Secondary | ICD-10-CM

## 2013-02-04 DIAGNOSIS — E785 Hyperlipidemia, unspecified: Secondary | ICD-10-CM

## 2013-02-04 DIAGNOSIS — S42201S Unspecified fracture of upper end of right humerus, sequela: Secondary | ICD-10-CM

## 2013-02-04 DIAGNOSIS — I10 Essential (primary) hypertension: Secondary | ICD-10-CM

## 2013-02-04 DIAGNOSIS — Z0000 Encounter for general adult medical examination without abnormal findings: Secondary | ICD-10-CM

## 2013-02-04 DIAGNOSIS — S3282XS Multiple fractures of pelvis without disruption of pelvic ring, sequela: Secondary | ICD-10-CM

## 2013-02-04 DIAGNOSIS — C329 Malignant neoplasm of larynx, unspecified: Secondary | ICD-10-CM

## 2013-02-04 LAB — LIPID PANEL
Cholesterol: 162 mg/dL (ref 0–200)
LDL Cholesterol: 67 mg/dL (ref 0–99)
Total CHOL/HDL Ratio: 2
VLDL: 16.4 mg/dL (ref 0.0–40.0)

## 2013-02-04 LAB — COMPREHENSIVE METABOLIC PANEL
ALT: 16 U/L (ref 0–35)
AST: 17 U/L (ref 0–37)
Albumin: 4.1 g/dL (ref 3.5–5.2)
Alkaline Phosphatase: 132 U/L — ABNORMAL HIGH (ref 39–117)
BUN: 14 mg/dL (ref 6–23)
Potassium: 5 mEq/L (ref 3.5–5.1)

## 2013-02-04 NOTE — Progress Notes (Signed)
Subjective:    Patient ID: [name], female    DOB: [birth year], 72 y.o.   MRN: [id number]  HPI The patient is here for annual Medicare wellness examination and management of other chronic and acute problems.   The risk factors are reflected in the social history.  The roster of all physicians providing medical care to patient - is listed in the Snapshot section of the chart.  Activities of daily living:  The patient is 100% inedpendent in all ADLs: dressing, toileting, feeding as well as independent mobility using a cane. Does not drive. Has completed HH-PT/OT  Home safety : The patient has smoke detectors in the home. They wear seatbelts. No firearms at home. There is no violence in the home.   There is no risks for hepatitis, STDs or HIV. There is no   history of blood transfusion. They have no travel history to infectious disease endemic areas of the world.  The patient has not seen their dentist in the last six month. Has upper partial, lower denture. Has had oral treatment for head and neck cancer.They have seen their eye doctor in the last year. They deny any hearing difficulty and have not had audiologic testing in the last year.  They do not  have excessive sun exposure. Discussed the need for sun protection: hats, long sleeves and use of sunscreen if there is significant sun exposure.   Diet: the importance of a healthy diet is discussed. They do have a healthy diet.  The patient has no regular exercise program: but has been doing physical therapy after hip fracture and UE fracture.  The benefits of regular aerobic exercise were discussed.  Depression screen: there are no signs or vegative symptoms of depression- irritability, change in appetite, anhedonia, sadness/tearfullness.  Cognitive assessment: the patient manages all their financial and personal affairs and is actively engaged.   The following portions of the patient's history were reviewed and updated as  appropriate: allergies, current medications, past family history, past medical history,  past surgical history, past social history  and problem list.  Vision, hearing, body mass index were assessed and reviewed.   During the course of the visit the patient was educated and counseled about appropriate screening and preventive services including : fall prevention , diabetes screening, nutrition counseling, colorectal cancer screening, and recommended immunizations. Has an appointment with gynecologist - Dr. Chevis Pretty coming up - she has a h/o abnormal PAPs.   Past Medical History  Diagnosis Date  . Peripheral vascular disease   . Subdural hematoma   . Ataxia   . Mild dysplasia of cervix   . UTI (lower urinary tract infection)   . Urinary incontinence   . Cerebrovascular disease   . Personal history of colonic polyps     adenomatous 1997 & tubular adenoma and hyplastic  2008  . Hyperlipidemia     takes Pravastatin daily  . H/O alcohol abuse   . Diverticulosis of colon (without mention of hemorrhage)   . Redundant colon   . C. difficile colitis   . Dementia t  . Ulcerative colitis     takes [place] daily  . Dysrhythmia     a fib-takes Diltiazem daily  . Seizures     takes Phenobarbital and Keppra daily;last seizure was April 1,2014  . Stroke 2000    off balance on right side a little and peripheral vision loss on right side  . Arthritis   . Joint pain   . Joint swelling   .  History of UTI 10-2012  . Hypothyroidism     takes Levothyroxine daily  . Squamous cell carcinoma of mouth   . Adenocarcinoma, breast     bilateral  . Anxiety   . Dementia   . Cancer of larynx   . Hypertension   . Atrial fibrillation   . History of breast cancer   . History of seizures   . Gait disorder   . Oropharyngeal cancer   . History of colitis    Past Surgical History  Procedure Laterality Date  . Subdural hematoma evacuation via craniotomy    . Oral surgery for squamous cell carcinoma of the  mouth      x 3  . Multiple tooth extractions      due to oral cancer  . Cataract extraction, bilateral    . Breast lumpectomy      left breast with radiation therapy  . Carotid endarterectomy      left  . Mastectomy      Right, history with nodule dissection  . Orif hip fracture  Sept '12    Right hip: screw and plate repair.  . Larynx surgery  08/2010    [facility]  . Tubal ligation    . Colonoscopy    . Orif humerus fracture Right 10/30/2012    Procedure: OPEN REDUCTION INTERNAL FIXATION (ORIF) HUMERAL SHAFT FRACTURE;  Surgeon: Velna Ochs, MD;  Location: [facility] OR;  Service: Orthopedics;  Laterality: Right;  BIG C-ARM, SHOULDER POSITION   Family History  Problem Relation Age of Onset  . Coronary artery disease Mother   . Heart disease Mother   . Diabetes Sister   . Breast cancer Maternal Aunt   . Breast cancer Sister    History   Social History  . Marital Status: Married    Spouse Name: N/A    Number of Children: 2  . Years of Education: 12   Occupational History  . retired     Sales executive   Social History Main Topics  . Smoking status: Former Smoker -- 40 years    Quit date: 10/26/2004  . Smokeless tobacco: Never Used     Comment: quit in Nov 23, 2005  . Alcohol Use: 4.2 oz/week    7 Glasses of wine per week     Comment: normally 1 glass wine after dinner - none x 2.5 months  . Drug Use: No  . Sexually Active: No   Other Topics Concern  . Not on file   Social History Narrative   HSG. Married x 7 years divorced; married 11/24/67. 1 son- died as a neonate, 1 son- 11/24/58. Retired- worked as a Sales executive.   End of life: has a living will- does not want futile/ heroic care; no cpr.     Marriage- reports marriage is in good health (4/10)               Current Outpatient Prescriptions on File Prior to Visit  Medication Sig Dispense Refill  . Calcium Carbonate-Vitamin D (CALCIUM 500 + D PO) Take 1 tablet by mouth daily.      Marland Kitchen diltiazem (CARDIZEM CD) 180 MG 24  hr capsule Take 1 capsule (180 mg total) by mouth daily.  90 capsule  3  . folic acid (FOLVITE) 1 MG tablet Take 1 tablet (1 mg total) by mouth daily.  90 tablet  1  . levETIRAcetam (KEPPRA) 750 MG tablet Take 2 tablets (1,500 mg total) by mouth every 12 (twelve)  hours.  360 tablet  3  . levothyroxine (SYNTHROID, LEVOTHROID) 50 MCG tablet Take 1 tablet (50 mcg total) by mouth daily.  90 tablet  0  . lidocaine (XYLOCAINE) 2 % solution Take 20 mLs by mouth every 3 (three) hours as needed. Oral pain      . mesalamine (LIALDA) 1.2 G EC tablet Take 2 tablets (2.4 g total) by mouth 2 (two) times daily.  96 tablet  0  . Multiple Vitamin (MULTIVITAMIN WITH MINERALS) TABS Take 1 tablet by mouth daily.      Marland Kitchen PHENobarbital (LUMINAL) 97.2 MG tablet Take 97.2 mg by mouth daily.      . pravastatin (PRAVACHOL) 40 MG tablet Take 1 tablet (40 mg total) by mouth every evening.  90 tablet  0  . vitamin C (ASCORBIC ACID) 500 MG tablet Take 500 mg by mouth daily.       No current facility-administered medications on file prior to visit.       Review of Systems System review is negative for any constitutional, cardiac, pulmonary, GI or neuro symptoms or complaints other than as described in the HPI.     Objective:   Physical Exam Filed Vitals:   02/04/13 1335  BP: 130/68  Pulse: 93  Temp: 97.9 F (36.6 C)   Wt Readings from Last 3 Encounters:  02/04/13 127 lb 9.6 oz (57.879 kg)  01/14/13 114 lb (51.71 kg)  07/19/12 128 lb (58.06 kg)   Gen'l: well nourished, well developed     Woman in no distress HEENT - [place]/AT, EACs/TMs normal, oropharynx with native dentition in good condition, no buccal or palatal lesions, posterior pharynx clear, mucous membranes moist. C&S clear, PERRLA, fundi - normal Neck - supple, no thyromegaly Nodes- negative submental, cervical, supraclavicular regions Chest - no deformity, no CVAT Lungs - cleat without rales, wheezes. No increased work of breathing Breast  - Cardiovascular - regular rate and rhythm, quiet precordium, no murmurs, rubs or gallops, 2+ radial, DP and PT pulses Abdomen - BS+ x 4, no HSM, no guarding or rebound or tenderness Pelvic - deferred to gyn Rectal - deferred to gyn Extremities - no clubbing, cyanosis, edema or deformity.  Neuro - A&O x 3, CN II-XII normal, motor strength normal and equal, DTRs 2+ and symmetrical biceps, radial, and patellar tendons. Cerebellar - no tremor, no rigidity, fluid movement and normal gait. Derm - Head, neck, back, abdomen and extremities without suspicious lesions  Recent Results (from the past 2160 hour(s))  LIPID PANEL     Status: None   Collection Time    02/04/13  2:49 PM      Result Value Range   Cholesterol 162  0 - 200 mg/dL   Comment: ATP III Classification       Desirable:  < 200 mg/dL               Borderline High:  200 - 239 mg/dL          High:  > = 098 mg/dL   Triglycerides 11.9  0.0 - 149.0 mg/dL   Comment: Normal:  <147 mg/dLBorderline High:  150 - 199 mg/dL   HDL 82.95  >62.13 mg/dL   VLDL 08.6  0.0 - 57.8 mg/dL   LDL Cholesterol 67  0 - 99 mg/dL   Total CHOL/HDL Ratio 2     Comment:                Men  Women1/2 Average Risk     3.4          3.3Average Risk          5.0          4.42X Average Risk          9.6          7.13X Average Risk          15.0          11.0                      COMPREHENSIVE METABOLIC PANEL     Status: Abnormal   Collection Time    02/04/13  2:49 PM      Result Value Range   Sodium 134 (*) 135 - 145 mEq/L   Potassium 5.0  3.5 - 5.1 mEq/L   Chloride 96  96 - 112 mEq/L   CO2 32  19 - 32 mEq/L   Glucose, Bld 91  70 - 99 mg/dL   BUN 14  6 - 23 mg/dL   Creatinine, Ser 0.7  0.4 - 1.2 mg/dL   Total Bilirubin 0.5  0.3 - 1.2 mg/dL   Alkaline Phosphatase 132 (*) 39 - 117 U/L   AST 17  0 - 37 U/L   ALT 16  0 - 35 U/L   Total Protein 7.6  6.0 - 8.3 g/dL   Albumin 4.1  3.5 - 5.2 g/dL   Calcium 9.5  8.4 - 16.1 mg/dL   GFR 09.60  >45.40 mL/min          Assessment & Plan:

## 2013-02-04 NOTE — Patient Instructions (Addendum)
Good to see you doing so well. It has been a rough row to how.  Your exam today is good. We will check routine lab today - a lipid panel, recheck the sodium. Results will be available on MyChart  No changes in your medical regimen.  Keep up the good work.

## 2013-02-05 DIAGNOSIS — Z0000 Encounter for general adult medical examination without abnormal findings: Secondary | ICD-10-CM | POA: Insufficient documentation

## 2013-02-05 NOTE — Assessment & Plan Note (Signed)
Lab reveals LDL is better than goal of 100 or less. HDL is ok. Liver functions are normal.   Plan Continue present regimen

## 2013-02-05 NOTE — Assessment & Plan Note (Signed)
Stable exam. Will continue ASA alone given high fall risk and previous injury.

## 2013-02-05 NOTE — Assessment & Plan Note (Signed)
BP Readings from Last 3 Encounters:  02/04/13 130/68  01/14/13 114/65  07/19/12 112/72   Good control. No change in medications

## 2013-02-05 NOTE — Assessment & Plan Note (Signed)
Lab Results  Component Value Date   TSH 1.961 10/03/2012   Good control on present dose of replacement hormone

## 2013-02-05 NOTE — Assessment & Plan Note (Signed)
Stable on present medication dosing. No seizures since last hospitalization.

## 2013-02-05 NOTE — Assessment & Plan Note (Signed)
Interval history - she has been making a good recovery after seizure related fall with fracture pelvis and humerus. She is home with her husband. Limited exam today is stable. Lab results are in normal limits with very good control of lipids. Previous labs reviewed. She is current with colorectal and breast cancer screenng. Immunizations are up to date except for shingles.  In summary   a nice woman who has been through a lot but at this time seems medically stable. She will return for follow up in 3-4 months.

## 2013-02-05 NOTE — Assessment & Plan Note (Signed)
Doing well. Ambulating with a cane.

## 2013-02-05 NOTE — Assessment & Plan Note (Signed)
Making a good recovery. Using a cane to walk. Has use of her arm.

## 2013-02-05 NOTE — Assessment & Plan Note (Signed)
Stable per last eval by Dr. Manson Passey at [facility]. She is able to swallow and uses larynx amplifier with good results.  Plan Follow up with Dr. Manson Passey as instructed.

## 2013-03-04 HISTORY — PX: HAMMER TOE SURGERY: SHX385

## 2013-03-05 ENCOUNTER — Telehealth: Payer: Self-pay

## 2013-03-05 NOTE — Telephone Encounter (Signed)
Phone call from Eulogio Bear at [facility] [phone] stating an addendum is needed to service dates 03/25/12 - 03/29/12 [facility] hospital stay. The entire stay was billed for observation and per patient's plan only a max 48 hours is allowed for observation. It needs to be re-coded. Please advise. She also states she left a message for [place].

## 2013-03-06 NOTE — Telephone Encounter (Signed)
Recoding the hospital stay from observation to in-patient is the hospitals responsibility, i believe. If I can enter an order for this change I need to know how to do it.

## 2013-03-14 ENCOUNTER — Ambulatory Visit (INDEPENDENT_AMBULATORY_CARE_PROVIDER_SITE_OTHER): Payer: Medicare Other

## 2013-03-14 DIAGNOSIS — Z23 Encounter for immunization: Secondary | ICD-10-CM

## 2013-03-25 ENCOUNTER — Encounter: Payer: Self-pay | Admitting: Gynecology

## 2013-04-19 ENCOUNTER — Other Ambulatory Visit: Payer: Self-pay

## 2013-04-19 ENCOUNTER — Other Ambulatory Visit: Payer: Self-pay | Admitting: Neurology

## 2013-04-19 MED ORDER — PRAVASTATIN SODIUM 40 MG PO TABS: 40.0000 mg | ORAL_TABLET | Freq: Every evening | ORAL | Status: DC

## 2013-04-19 MED ORDER — PHENOBARBITAL 97.2 MG PO TABS: 97.2000 mg | ORAL_TABLET | Freq: Every day | ORAL | Status: DC

## 2013-04-19 MED ORDER — LEVOTHYROXINE SODIUM 50 MCG PO TABS: 50.0000 ug | ORAL_TABLET | Freq: Every day | ORAL | Status: DC

## 2013-04-19 MED ORDER — DILTIAZEM HCL ER COATED BEADS 180 MG PO CP24: 180.0000 mg | ORAL_CAPSULE | Freq: Every day | ORAL | Status: DC

## 2013-04-19 NOTE — Telephone Encounter (Signed)
Rx signed and faxed.

## 2013-05-10 ENCOUNTER — Encounter: Payer: Self-pay | Admitting: Internal Medicine

## 2013-05-31 IMAGING — US US ABDOMEN COMPLETE
1 series · 14 of 25 positions shown · non-contrast
Comparison: 10/26/2011.

CLINICAL DATA: Cholecystitis.

COMPLETE ABDOMINAL ULTRASOUND

[Series 1: us abdomen complete · 0.30mm/px · 14 of 82 slices shown]
[im 1/82]
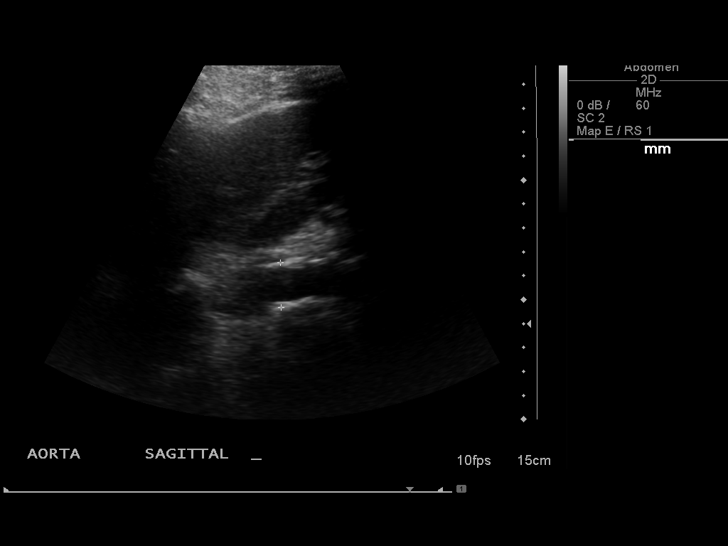
[im 7/82]
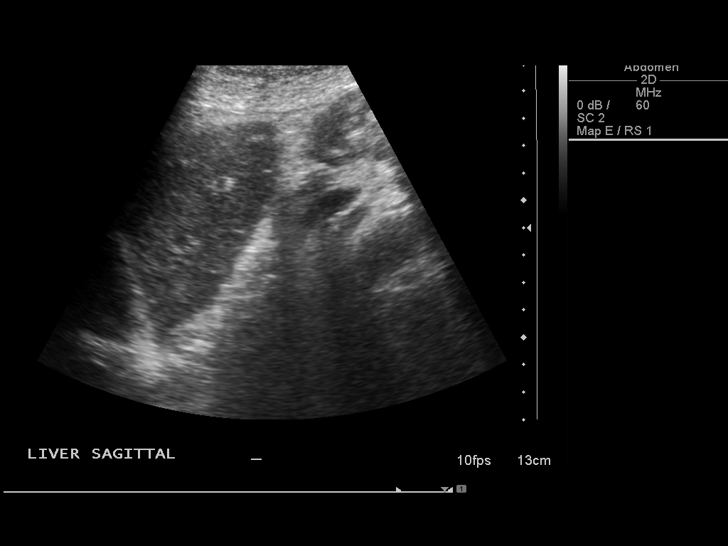
[im 14/82]
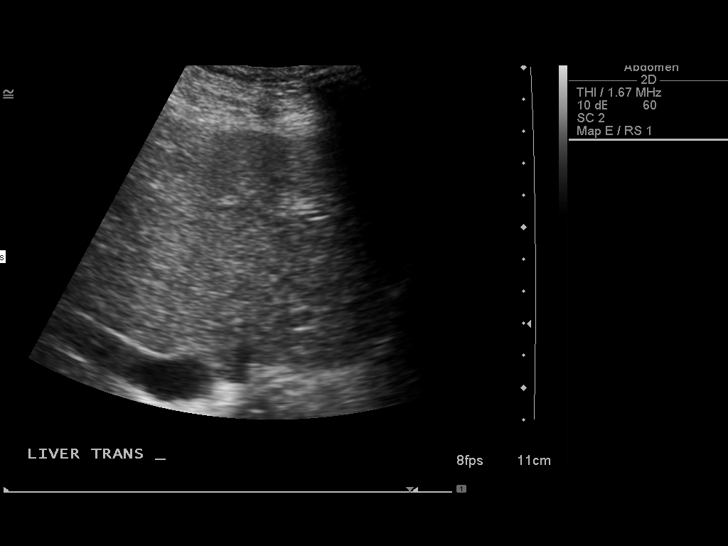
[im 21/82]
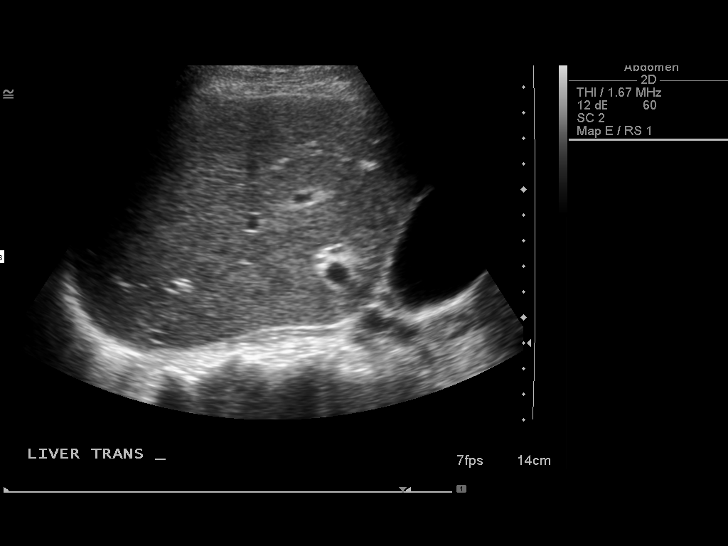
[im 28/82]
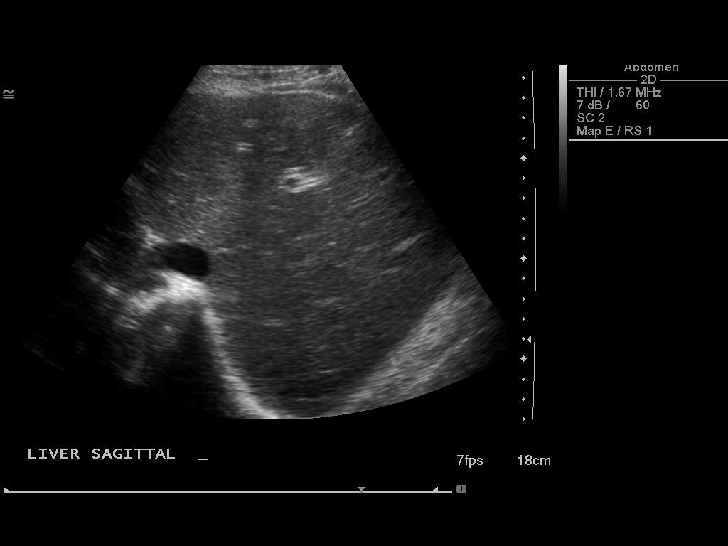
[im 31/82]
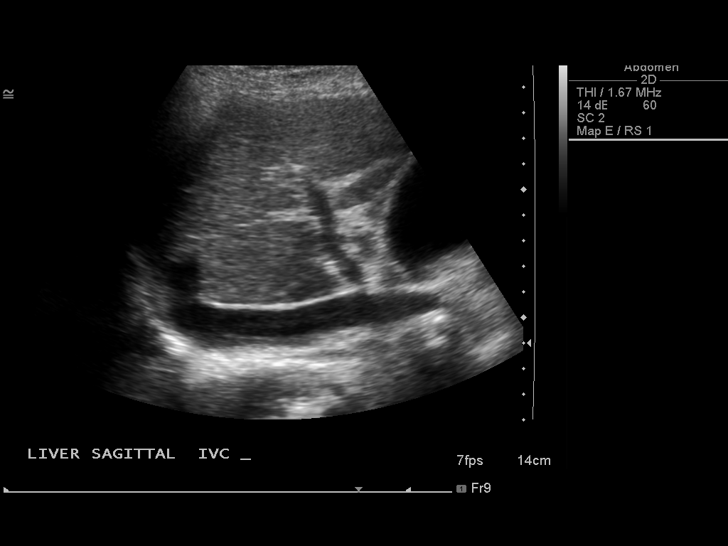
[im 38/82]
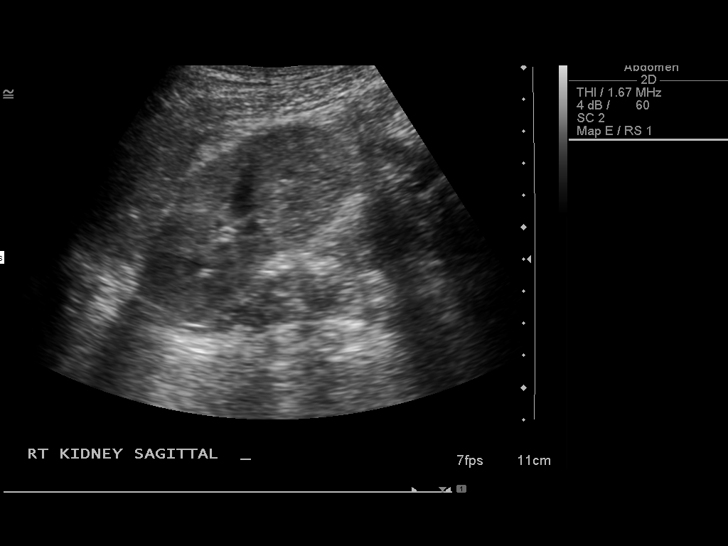
[im 44/82]
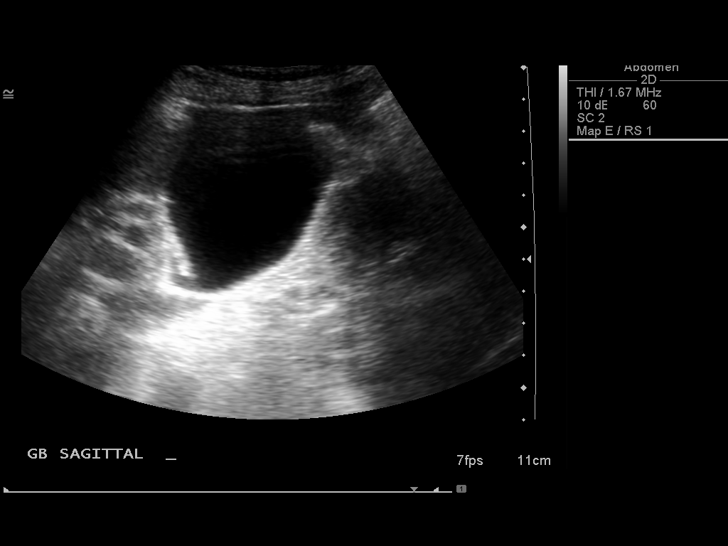
[im 51/82]
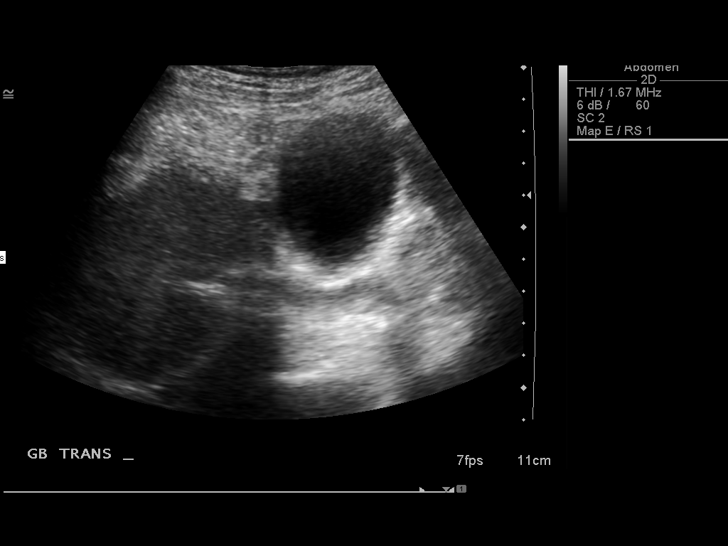
[im 55/82]
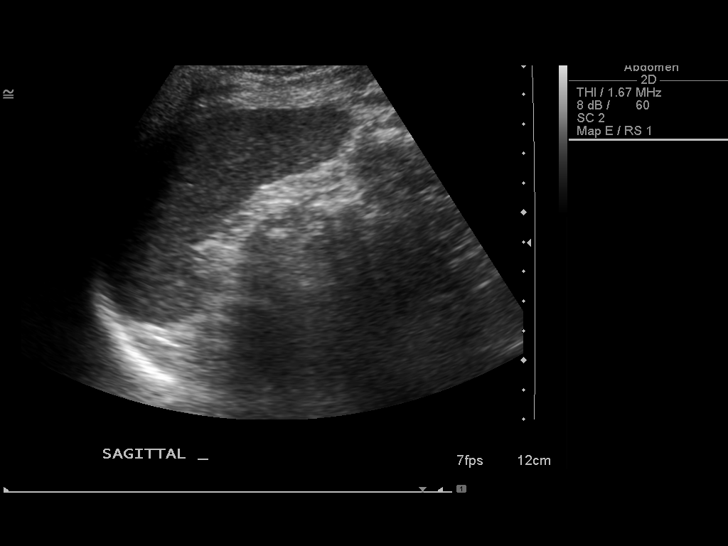
[im 61/82]
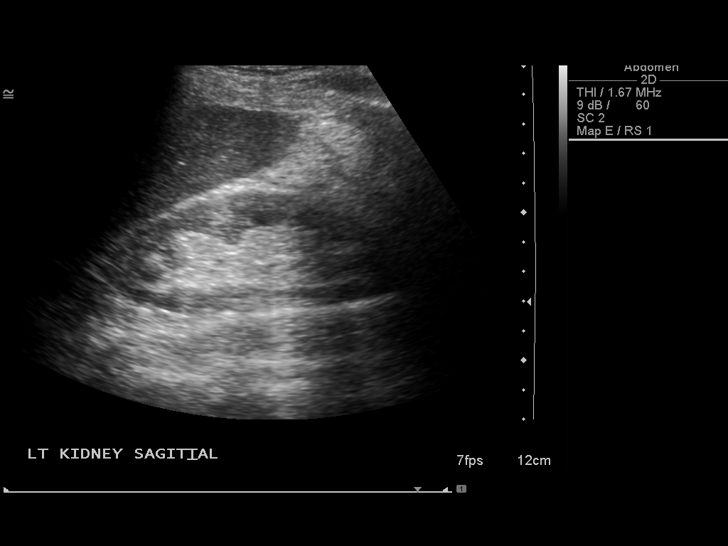
[im 68/82]
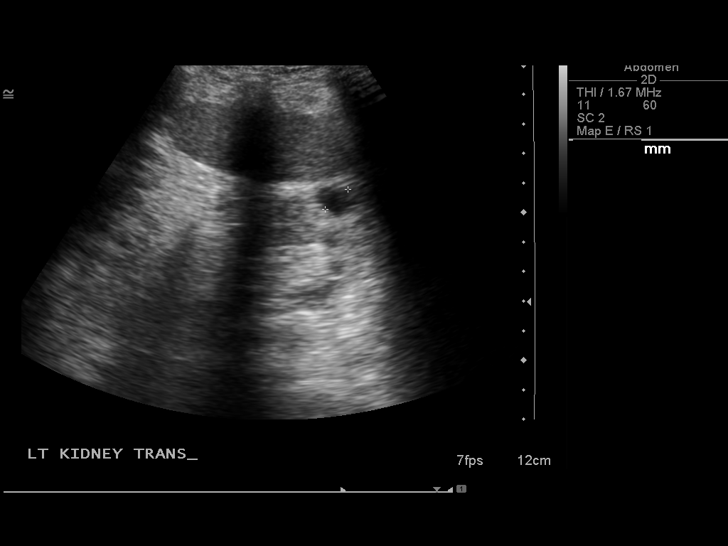
[im 75/82]
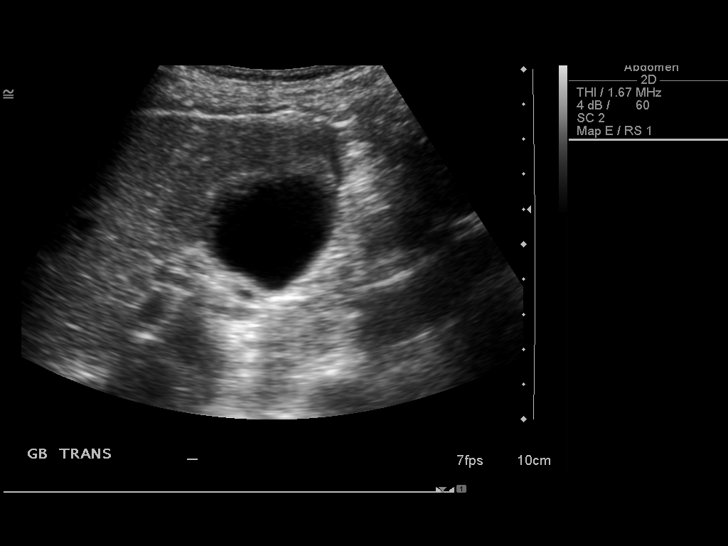
[im 82/82]
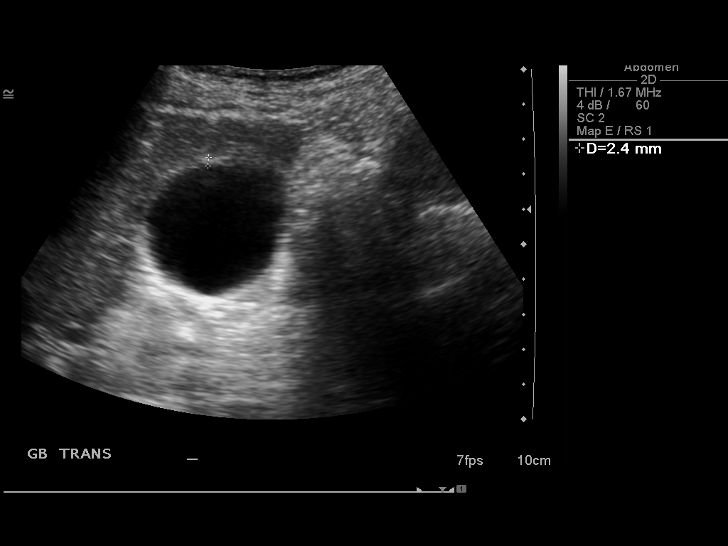

[14 of 25 positions shown; findings below may reference images not displayed]

FINDINGS: Gallbladder:  No gallstones, gallbladder wall thickening, or
pericholecystic fluid.

Common bile duct:  5.3 mm, normal.

Liver:  6 mm simple hepatic cyst identified in the left hepatic
lobe.  Otherwise normal.

IVC:  Appears normal.

Pancreas:  No focal abnormality seen.

Spleen:  93 mm.  Normal echotexture.

Right Kidney:  11.4 cm long axis.

Left Kidney:  10.2 cm.  Mild renal atrophy.  15 mm interpolar renal
cyst.

Abdominal aorta:  19 mm, normal.
IMPRESSION: 1.  No cholelithiasis or cholecystitis.
2.  Left renal cyst and mild left renal atrophy.
3.  6 mm left hepatic lobe simple cyst.

## 2013-06-10 ENCOUNTER — Encounter: Payer: Self-pay | Admitting: Internal Medicine

## 2013-06-11 ENCOUNTER — Ambulatory Visit (INDEPENDENT_AMBULATORY_CARE_PROVIDER_SITE_OTHER): Payer: Medicare Other | Admitting: Internal Medicine

## 2013-06-11 ENCOUNTER — Other Ambulatory Visit (INDEPENDENT_AMBULATORY_CARE_PROVIDER_SITE_OTHER): Payer: Medicare Other

## 2013-06-11 ENCOUNTER — Encounter: Payer: Self-pay | Admitting: Internal Medicine

## 2013-06-11 VITALS — BP 108/60 | HR 96 | Ht 64.0 in | Wt 120.5 lb

## 2013-06-11 DIAGNOSIS — K5190 Ulcerative colitis, unspecified, without complications: Secondary | ICD-10-CM

## 2013-06-11 DIAGNOSIS — E875 Hyperkalemia: Secondary | ICD-10-CM

## 2013-06-11 LAB — CBC
HCT: 39.2 % (ref 36.0–46.0)
MCV: 96 fl (ref 78.0–100.0)
Platelets: 260 10*3/uL (ref 150.0–400.0)
RBC: 4.09 Mil/uL (ref 3.87–5.11)
RDW: 13.6 % (ref 11.5–14.6)

## 2013-06-11 MED ORDER — MESALAMINE ER 0.375 G PO CP24: ORAL_CAPSULE | ORAL | Status: DC

## 2013-06-11 NOTE — Patient Instructions (Signed)
We will contact you to schedule your Flexible sigmoidoscopy at [facility]  We have sent the following medications to your pharmacy for you to pick up at your convenience: [name]' take 4 tablets daily  Your physician has requested that you go to the basement for the following lab work before leaving today: cbc, bmet, esr

## 2013-06-12 ENCOUNTER — Encounter: Payer: Self-pay | Admitting: Internal Medicine

## 2013-06-12 ENCOUNTER — Other Ambulatory Visit: Payer: Self-pay | Admitting: Gastroenterology

## 2013-06-12 ENCOUNTER — Encounter (HOSPITAL_COMMUNITY): Payer: Self-pay | Admitting: *Deleted

## 2013-06-12 ENCOUNTER — Telehealth: Payer: Self-pay | Admitting: Gastroenterology

## 2013-06-12 LAB — BASIC METABOLIC PANEL
BUN: 10 mg/dL (ref 6–23)
Calcium: 9.3 mg/dL (ref 8.4–10.5)
GFR: 99.75 mL/min (ref >60.00)
Glucose, Bld: 117 mg/dL — ABNORMAL HIGH (ref 70–99)
Sodium: 134 mEq/L — ABNORMAL LOW (ref 135–145)

## 2013-06-12 NOTE — Progress Notes (Signed)
Patient ID: [name], female   DOB: 02/05/1941, 72 y.o.   MRN: [id number] HPI: [name] is a 72-year-old female with a complicated past medical history including ulcerative colitis, previously on Remicade, history of C. difficile colitis, history of laryngeal cancer status post initial resection followed by laryngectomy and radiation, status post hip fracture and shoulder fracture due to a fall, who is seen in followup. She has not been seen by me since March 2013.  She had a prolonged hospitalization in [place], followed by a stay in rehabilitation for 3 months between April and June of 2014. She also had a G-tube for some time which has been removed.  She is here with her husband today and they report that overall she is doing better. She is having 1-2 formed stools daily which are nonbloody. Previously she was having bloody stools along with mucus. They've also not seen mucus. He reports she's had a good appetite though recently has had trouble chewing due to loose dentures. She is having new dentures placed next week. She has lost 5-10 pounds recently which she feels is related to her inability to chew. She denies any recent diarrhea. No abdominal pain. No nausea or vomiting. She is swallowing well. No heartburn.  She has continued on Lialda 4.8 g daily. She has not had Remicade in at least 18 monthsch was negative.   Past Medical History  Diagnosis Date  . Peripheral vascular disease   . Subdural hematoma   . Ataxia   . Mild dysplasia of cervix   . UTI (lower urinary tract infection)   . Urinary incontinence   . Cerebrovascular disease   . Personal history of colonic polyps     adenomatous 1997 & tubular adenoma and hyplastic  2008  . Hyperlipidemia     takes Pravastatin daily  . H/O alcohol abuse   . Diverticulosis of colon (without mention of hemorrhage)   . Redundant colon   . C. difficile colitis   . Dementia t  . Ulcerative colitis     takes [place] daily  .  Dysrhythmia     a fib-takes Diltiazem daily  . Seizures     takes Phenobarbital and Keppra daily;last seizure was April 1,2014  . Stroke 2000    off balance on right side a little and peripheral vision loss on right side  . Arthritis   . Joint pain   . Joint swelling   . History of UTI 10-2012  . Hypothyroidism     takes Levothyroxine daily  . Squamous cell carcinoma of mouth   . Adenocarcinoma, breast     bilateral  . Anxiety   . Dementia   . Cancer of larynx   . Hypertension   . Atrial fibrillation   . History of breast cancer   . History of seizures   . Gait disorder   . Oropharyngeal cancer   . History of colitis     Past Surgical History  Procedure Laterality Date  . Subdural hematoma evacuation via craniotomy    . Oral surgery for squamous cell carcinoma of the mouth      x 3  . Multiple tooth extractions      due to oral cancer  . Cataract extraction, bilateral    . Breast lumpectomy      left breast with radiation therapy  . Carotid endarterectomy      left  . Mastectomy      Right, history with nodule dissection  .   Orif hip fracture  Sept '12    Right hip: screw and plate repair.  . Larynx surgery  08/2010    [facility]  . Tubal ligation    . Colonoscopy    . Orif humerus fracture Right 10/30/2012    Procedure: OPEN REDUCTION INTERNAL FIXATION (ORIF) HUMERAL SHAFT FRACTURE;  Surgeon: Peter G Dalldorf, MD;  Location: [facility] OR;  Service: Orthopedics;  Laterality: Right;  BIG C-ARM, SHOULDER POSITION  . Hammer toe surgery Right 03/2013    pins and screws    Current Outpatient Prescriptions  Medication Sig Dispense Refill  . aspirin 81 MG tablet Take 81 mg by mouth daily.      . Calcium Carbonate-Vitamin D (CALCIUM 500 + D PO) Take 1 tablet by mouth daily.      . diltiazem (CARDIZEM CD) 180 MG 24 hr capsule Take 1 capsule (180 mg total) by mouth daily.  90 capsule  3  . folic acid (FOLVITE) 1 MG tablet Take 1 tablet (1 mg total) by mouth daily.  90 tablet  1   . levETIRAcetam (KEPPRA) 750 MG tablet Take 2 tablets (1,500 mg total) by mouth every 12 (twelve) hours.  360 tablet  3  . levothyroxine (SYNTHROID, LEVOTHROID) 50 MCG tablet Take 1 tablet (50 mcg total) by mouth daily.  90 tablet  3  . lidocaine (XYLOCAINE) 2 % solution Take 20 mLs by mouth every 3 (three) hours as needed. Oral pain      . Multiple Vitamin (MULTIVITAMIN WITH MINERALS) TABS Take 1 tablet by mouth daily.      . PHENobarbital (LUMINAL) 97.2 MG tablet Take 1 tablet (97.2 mg total) by mouth daily.  90 tablet  1  . pravastatin (PRAVACHOL) 40 MG tablet Take 1 tablet (40 mg total) by mouth every evening.  90 tablet  3  . vitamin C (ASCORBIC ACID) 500 MG tablet Take 500 mg by mouth daily.      . mesalamine (APRISO) 0.375 G 24 hr capsule 4 tablets daily  120 capsule  3   No current facility-administered medications for this visit.    No Known Allergies  Family History  Problem Relation Age of Onset  . Coronary artery disease Mother   . Heart disease Mother   . Diabetes Sister   . Breast cancer Maternal Aunt   . Breast cancer Sister     History  Substance Use Topics  . Smoking status: Former Smoker -- 40 years    Quit date: 10/26/2004  . Smokeless tobacco: Never Used     Comment: quit in 2007  . Alcohol Use: 4.2 oz/week    7 Glasses of wine per week     Comment: normally 1 glass wine after dinner -     ROS: As per history of present illness, otherwise negative  BP 108/60  Pulse 96  Ht 5' 4" (1.626 m)  Wt 120 lb 8 oz (54.658 kg)  BMI 20.67 kg/m2 Constitutional: Well-developed and thin female in no acute distress HEENT: Normocephalic and atraumatic. Oropharynx is clear and moist. No oropharyngeal exudate. Conjunctivae are normal.  No scleral icterus. Neck: Neck supple. Stoma in place anteriorly with scarring Cardiovascular: Normal rate, regular rhythm and intact distal pulses.  Pulmonary/chest: Effort normal and breath sounds normal. No wheezing, rales or  rhonchi. Abdominal: Soft, nontender, thin, nondistended. Bowel sounds active throughout. Extremities: no clubbing, cyanosis, or edema Lymphadenopathy: No cervical adenopathy noted. Neurological: Alert and oriented to person place and time. Skin: Skin is   warm and dry. No rashes noted. Psychiatric: Normal mood and affect. Behavior is normal.  RELEVANT LABS AND IMAGING: CBC    Component Value Date/Time   WBC 10.1 06/11/2013 1716   RBC 4.09 06/11/2013 1716   RBC 2.91* 10/30/2011 0502   HGB 13.0 06/11/2013 1716   HCT 39.2 06/11/2013 1716   PLT 260.0 06/11/2013 1716   MCV 96.0 06/11/2013 1716   MCH 31.3 10/31/2012 1215   MCHC 33.2 06/11/2013 1716   RDW 13.6 06/11/2013 1716   LYMPHSABS 0.9 10/02/2012 1518   MONOABS 1.0 10/02/2012 1518   EOSABS 0.0 10/02/2012 1518   BASOSABS 0.0 10/02/2012 1518    CMP     Component Value Date/Time   NA 134* 06/11/2013 1716   K 5.5* 06/11/2013 1716   CL 96 06/11/2013 1716   CO2 30 06/11/2013 1716   GLUCOSE 117* 06/11/2013 1716   BUN 10 06/11/2013 1716   CREATININE 0.6 06/11/2013 1716   CALCIUM 9.3 06/11/2013 1716   PROT 7.6 02/04/2013 1449   ALBUMIN 4.1 02/04/2013 1449   AST 17 02/04/2013 1449   ALT 16 02/04/2013 1449   ALKPHOS 132* 02/04/2013 1449   BILITOT 0.5 02/04/2013 1449   GFRNONAA 84* 10/31/2012 0535   GFRAA >90 10/31/2012 0535   Flex sig, 06/03/2011 -- moderate to severe colitis. Biopsies consistent with chronic active ulcerative colitis no dysplasia Colonoscopy, 11/09/2009 -- Dr. David Patterson, examined the cecum, prep excellent. Normal colon other than redundant colon.  No polyps or cancer seen (indication history of adenomatous polyps). Recommendation repeat colonoscopy 5 years  ASSESSMENT/PLAN: 72-year-old female with a complicated past medical history including ulcerative colitis, previously on Remicade, history of C. difficile colitis, history of laryngeal cancer status post initial resection followed by laryngectomy and radiation, status post hip fracture and  shoulder fracture due to a fall, who is seen in followup  1.  UC -- symptomatically she appears to be in remission. Her hemoglobin has improved which is a good indicator that she is not having significant bleeding.  She has been maintained only on a 4.8 g daily but this is becoming cost prohibitive. They are in a doughnut hole associated with her Medicare coverage and asked for a cheaper medication.  I do think she needs maintenance therapy and have prescribed Apriso 1.5 g daily (4 capsules). She will discontinue Lialda after completing her current supply and replace with Apriso.  We also talked about assessing activity in the colon to this point to ensure there is endoscopic healing to go with her symptomatic improvement. We will plan flexible sigmoidoscopy next week in the hospital setting. The procedure was discussed today with both she and her husband and they're agreeable to proceed.  2.  Hx of adenomas -- she would be due colonoscopy for this purpose in 2016  3.  Hyperkalemia --  elevated on day of appointment, will check basic metabolic panel again. If still elevated refer to primary care  

## 2013-06-12 NOTE — Telephone Encounter (Signed)
Spoke to pt's husband told him pt's is scheduled at [facility] on 06/21/2013 @ 8:30 for a flexible sigmoidoscopy. They are to register at 7:00 and I am sending them out instructions today to call me when they get them so I can answer any questions they may have. Pt's husband verbalized understanding.

## 2013-06-13 ENCOUNTER — Other Ambulatory Visit: Payer: Self-pay | Admitting: *Deleted

## 2013-06-13 ENCOUNTER — Other Ambulatory Visit (INDEPENDENT_AMBULATORY_CARE_PROVIDER_SITE_OTHER): Payer: Medicare Other

## 2013-06-13 DIAGNOSIS — E871 Hypo-osmolality and hyponatremia: Secondary | ICD-10-CM

## 2013-06-13 LAB — BASIC METABOLIC PANEL
BUN: 10 mg/dL (ref 6–23)
Calcium: 9.7 mg/dL (ref 8.4–10.5)
Creatinine, Ser: 0.7 mg/dL (ref 0.4–1.2)
GFR: 94.46 mL/min (ref >60.00)
Potassium: 5.3 mEq/L — ABNORMAL HIGH (ref 3.5–5.1)

## 2013-06-14 ENCOUNTER — Encounter (HOSPITAL_COMMUNITY): Payer: Self-pay | Admitting: Pharmacy Technician

## 2013-06-17 ENCOUNTER — Telehealth: Payer: Self-pay | Admitting: *Deleted

## 2013-06-17 ENCOUNTER — Encounter (HOSPITAL_COMMUNITY)
Admission: RE | Admit: 2013-06-17 | Discharge: 2013-06-17 | Disposition: A | Discharge status: Home / Self Care | Payer: Medicare Other | Source: Ambulatory Visit | Attending: Internal Medicine | Admitting: Internal Medicine

## 2013-06-17 MED ORDER — MESALAMINE 800 MG PO TBEC: 2.0000 | DELAYED_RELEASE_TABLET | Freq: Three times a day (TID) | ORAL | Status: DC

## 2013-06-17 NOTE — Telephone Encounter (Signed)
Informed pt of lab results and I sent Dr Debby Bud a note with the last K+ value. Ordered new Asacol HD for pt and left samples up front. Dr [name], Mr Villagomez wants to know about the labs, ESR, Sodium, etc. Please advise.

## 2013-06-17 NOTE — Telephone Encounter (Signed)
ESR is mildly elevated, which is nonspecific. We will be checking activity of UC at flex sig later this week CBC looks good Metabolic panel was what was in question, Na a bit low, K a bit high, referring to PCP

## 2013-06-17 NOTE — Anesthesia Preprocedure Evaluation (Addendum)
Anesthesia Evaluation  Patient identified by MRN, date of birth, ID band Patient awake    Reviewed: Allergy & Precautions, H&P , NPO status , Patient's Chart, lab work & pertinent test results  Airway Mallampati: II TM Distance: >3 FB Neck ROM: full    Dental  (+) Edentulous Upper, Edentulous Lower and Dental Advisory Given   Pulmonary neg pulmonary ROS, former smoker,  layngectomy breath sounds clear to auscultation  Pulmonary exam normal       Cardiovascular hypertension, + dysrhythmias Atrial Fibrillation Rhythm:regular Rate:Normal  ECG AF   Neuro/Psych Seizures -,  Anxiety Subdural hematoma. 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 Last seizure 4/14.  CEA CVA, Residual Symptoms negative psych ROS   GI/Hepatic negative GI ROS, (+)     substance abuse  alcohol use,   Endo/Other  negative endocrine ROSHypothyroidism   Renal/GU negative Renal ROS  negative genitourinary   Musculoskeletal   Abdominal   Peds  Hematology negative hematology ROS (+)   Anesthesia Other Findings   Reproductive/Obstetrics negative OB ROS                          Anesthesia Physical Anesthesia Plan  ASA: III  Anesthesia Plan: MAC   Post-op Pain Management:    Induction:   Airway Management Planned:   Additional Equipment:   Intra-op Plan:   Post-operative Plan:   Informed Consent: I have reviewed the patients History and Physical, chart, labs and discussed the procedure including the risks, benefits and alternatives for the proposed anesthesia with the patient or authorized representative who has indicated his/her understanding and acceptance.   Dental Advisory Given  Plan Discussed with: CRNA and Surgeon  Anesthesia Plan Comments:         Anesthesia Quick Evaluation

## 2013-06-17 NOTE — Telephone Encounter (Signed)
Message copied by Florene Glen on Mon Jun 17, 2013  3:23 PM ------      Message from: Beverley Fiedler      Created: Thu Jun 13, 2013  4:48 PM       Asacol HD 1.6 grams three times daily (4.8 total grams daily in 3 divided doses)      Discontinue Apriso ------

## 2013-06-18 NOTE — Telephone Encounter (Signed)
Informed pt of Dr Lauro Franklin explanation of lab results and that I will forward this to Dr Debby Bud. Mr Benning stated understanding.

## 2013-06-18 NOTE — Telephone Encounter (Signed)
lmom to call back 

## 2013-06-21 ENCOUNTER — Ambulatory Visit (HOSPITAL_COMMUNITY)
Admission: RE | Admit: 2013-06-21 | Discharge: 2013-06-21 | Disposition: A | Discharge status: Home / Self Care | Payer: Medicare Other | Source: Ambulatory Visit | Attending: Internal Medicine | Admitting: Internal Medicine

## 2013-06-21 ENCOUNTER — Encounter (HOSPITAL_COMMUNITY): Payer: Self-pay

## 2013-06-21 ENCOUNTER — Encounter (HOSPITAL_COMMUNITY): Payer: Self-pay | Admitting: *Deleted

## 2013-06-21 ENCOUNTER — Encounter (HOSPITAL_COMMUNITY): Payer: Medicare Other | Admitting: Anesthesiology

## 2013-06-21 ENCOUNTER — Encounter (HOSPITAL_COMMUNITY)
Admission: RE | Disposition: A | Discharge status: Home / Self Care | Payer: Self-pay | Source: Ambulatory Visit | Attending: Internal Medicine

## 2013-06-21 ENCOUNTER — Ambulatory Visit (HOSPITAL_COMMUNITY): Admit: 2013-06-21 | Payer: Self-pay | Admitting: Internal Medicine

## 2013-06-21 ENCOUNTER — Ambulatory Visit (HOSPITAL_COMMUNITY): Payer: Medicare Other | Admitting: Anesthesiology

## 2013-06-21 DIAGNOSIS — Z79899 Other long term (current) drug therapy: Secondary | ICD-10-CM | POA: Insufficient documentation

## 2013-06-21 DIAGNOSIS — E785 Hyperlipidemia, unspecified: Secondary | ICD-10-CM | POA: Insufficient documentation

## 2013-06-21 DIAGNOSIS — R569 Unspecified convulsions: Secondary | ICD-10-CM | POA: Insufficient documentation

## 2013-06-21 DIAGNOSIS — Z87891 Personal history of nicotine dependence: Secondary | ICD-10-CM | POA: Insufficient documentation

## 2013-06-21 DIAGNOSIS — Z860100 Personal history of colon polyps, unspecified: Secondary | ICD-10-CM | POA: Insufficient documentation

## 2013-06-21 DIAGNOSIS — K5180 Other ulcerative colitis without complications: Secondary | ICD-10-CM | POA: Insufficient documentation

## 2013-06-21 DIAGNOSIS — H5460 Unqualified visual loss, one eye, unspecified: Secondary | ICD-10-CM | POA: Insufficient documentation

## 2013-06-21 DIAGNOSIS — I4891 Unspecified atrial fibrillation: Secondary | ICD-10-CM | POA: Insufficient documentation

## 2013-06-21 DIAGNOSIS — E039 Hypothyroidism, unspecified: Secondary | ICD-10-CM | POA: Insufficient documentation

## 2013-06-21 DIAGNOSIS — Z8521 Personal history of malignant neoplasm of larynx: Secondary | ICD-10-CM | POA: Insufficient documentation

## 2013-06-21 DIAGNOSIS — K5190 Ulcerative colitis, unspecified, without complications: Secondary | ICD-10-CM

## 2013-06-21 DIAGNOSIS — I10 Essential (primary) hypertension: Secondary | ICD-10-CM | POA: Insufficient documentation

## 2013-06-21 DIAGNOSIS — Z7982 Long term (current) use of aspirin: Secondary | ICD-10-CM | POA: Insufficient documentation

## 2013-06-21 DIAGNOSIS — I69993 Ataxia following unspecified cerebrovascular disease: Secondary | ICD-10-CM | POA: Insufficient documentation

## 2013-06-21 DIAGNOSIS — E875 Hyperkalemia: Secondary | ICD-10-CM | POA: Insufficient documentation

## 2013-06-21 DIAGNOSIS — Z853 Personal history of malignant neoplasm of breast: Secondary | ICD-10-CM | POA: Insufficient documentation

## 2013-06-21 HISTORY — PX: FLEXIBLE SIGMOIDOSCOPY: SHX5431

## 2013-06-21 SURGERY — SIGMOIDOSCOPY, FLEXIBLE
Anesthesia: Monitor Anesthesia Care

## 2013-06-21 MED ORDER — LIDOCAINE HCL (CARDIAC) 20 MG/ML IV SOLN
INTRAVENOUS | Status: AC
  Filled 2013-06-21: qty 5

## 2013-06-21 MED ORDER — SODIUM CHLORIDE 0.9 % IV SOLN
INTRAVENOUS | Status: DC
  Administered 2013-06-21: 1000 mL via INTRAVENOUS

## 2013-06-21 MED ORDER — FLEET ENEMA 7-19 GM/118ML RE ENEM
ENEMA | RECTAL | Status: AC
  Filled 2013-06-21: qty 1

## 2013-06-21 MED ORDER — LIDOCAINE HCL (CARDIAC) 20 MG/ML IV SOLN
INTRAVENOUS | Status: DC | PRN
  Administered 2013-06-21: 60 mg via INTRAVENOUS

## 2013-06-21 MED ORDER — PROPOFOL 10 MG/ML IV BOLUS
INTRAVENOUS | Status: AC
  Filled 2013-06-21: qty 20

## 2013-06-21 MED ORDER — FENTANYL CITRATE 0.05 MG/ML IJ SOLN: 25.0000 ug | INTRAMUSCULAR | Status: DC | PRN

## 2013-06-21 MED ORDER — LACTATED RINGERS IV SOLN: INTRAVENOUS | Status: DC

## 2013-06-21 MED ORDER — FLEET ENEMA 7-19 GM/118ML RE ENEM: 1.0000 | ENEMA | Freq: Once | RECTAL | Status: DC

## 2013-06-21 MED ORDER — PROPOFOL INFUSION 10 MG/ML OPTIME
INTRAVENOUS | Status: DC | PRN
  Administered 2013-06-21: 50 ug/kg/min via INTRAVENOUS

## 2013-06-21 MED ORDER — LACTATED RINGERS IV SOLN
INTRAVENOUS | Status: DC | PRN
  Administered 2013-06-21: 08:00:00 via INTRAVENOUS

## 2013-06-21 NOTE — Interval H&P Note (Signed)
History and Physical Interval Note: No new complaints Here for flex sig with MAC to evaluate activity of known UC The nature of the procedure, as well as the risks, benefits, and alternatives were carefully and thoroughly reviewed with the patient. Ample time for discussion and questions allowed. The patient understood, was satisfied, and agreed to proceed.     06/21/2013 8:47 AM  [name]  has presented today for surgery, with the diagnosis of [name]  The various methods of treatment have been discussed with the patient and family. After consideration of risks, benefits and other options for treatment, the patient has consented to  Procedure(s): FLEXIBLE SIGMOIDOSCOPY (N/A) as a surgical intervention .  The patient's history has been reviewed, patient examined, no change in status, stable for surgery.  I have reviewed the patient's chart and labs.  Questions were answered to the patient's satisfaction.     Iyona Pehrson M

## 2013-06-21 NOTE — H&P (View-Only) (Signed)
Patient ID: [name], female   DOB: 02-21-1941, 72 y.o.   MRN: [id number] HPI: [name] is a 72 year old female with a complicated past medical history including ulcerative colitis, previously on Remicade, history of C. difficile colitis, history of laryngeal cancer status post initial resection followed by laryngectomy and radiation, status post hip fracture and shoulder fracture due to a fall, who is seen in followup. She has not been seen by me since March 2013.  She had a prolonged hospitalization in [place], followed by a stay in rehabilitation for 3 months between April and June of 2014. She also had a G-tube for some time which has been removed.  She is here with her husband today and they report that overall she is doing better. She is having 1-2 formed stools daily which are nonbloody. Previously she was having bloody stools along with mucus. They've also not seen mucus. He reports she's had a good appetite though recently has had trouble chewing due to loose dentures. She is having new dentures placed next week. She has lost 5-10 pounds recently which she feels is related to her inability to chew. She denies any recent diarrhea. No abdominal pain. No nausea or vomiting. She is swallowing well. No heartburn.  She has continued on Lialda 4.8 g daily. She has not had Remicade in at least 18 monthsch was negative.   Past Medical History  Diagnosis Date  . Peripheral vascular disease   . Subdural hematoma   . Ataxia   . Mild dysplasia of cervix   . UTI (lower urinary tract infection)   . Urinary incontinence   . Cerebrovascular disease   . Personal history of colonic polyps     adenomatous 1997 & tubular adenoma and hyplastic  2008  . Hyperlipidemia     takes Pravastatin daily  . H/O alcohol abuse   . Diverticulosis of colon (without mention of hemorrhage)   . Redundant colon   . C. difficile colitis   . Dementia t  . Ulcerative colitis     takes [place] daily  .  Dysrhythmia     a fib-takes Diltiazem daily  . Seizures     takes Phenobarbital and Keppra daily;last seizure was April 1,2014  . Stroke 2000    off balance on right side a little and peripheral vision loss on right side  . Arthritis   . Joint pain   . Joint swelling   . History of UTI 10-2012  . Hypothyroidism     takes Levothyroxine daily  . Squamous cell carcinoma of mouth   . Adenocarcinoma, breast     bilateral  . Anxiety   . Dementia   . Cancer of larynx   . Hypertension   . Atrial fibrillation   . History of breast cancer   . History of seizures   . Gait disorder   . Oropharyngeal cancer   . History of colitis     Past Surgical History  Procedure Laterality Date  . Subdural hematoma evacuation via craniotomy    . Oral surgery for squamous cell carcinoma of the mouth      x 3  . Multiple tooth extractions      due to oral cancer  . Cataract extraction, bilateral    . Breast lumpectomy      left breast with radiation therapy  . Carotid endarterectomy      left  . Mastectomy      Right, history with nodule dissection  .  Orif hip fracture  Sept '12    Right hip: screw and plate repair.  . Larynx surgery  08/2010    [facility]  . Tubal ligation    . Colonoscopy    . Orif humerus fracture Right 10/30/2012    Procedure: OPEN REDUCTION INTERNAL FIXATION (ORIF) HUMERAL SHAFT FRACTURE;  Surgeon: Velna Ochs, MD;  Location: [facility] OR;  Service: Orthopedics;  Laterality: Right;  BIG C-ARM, SHOULDER POSITION  . Hammer toe surgery Right 03/2013    pins and screws    Current Outpatient Prescriptions  Medication Sig Dispense Refill  . aspirin 81 MG tablet Take 81 mg by mouth daily.      . Calcium Carbonate-Vitamin D (CALCIUM 500 + D PO) Take 1 tablet by mouth daily.      Marland Kitchen diltiazem (CARDIZEM CD) 180 MG 24 hr capsule Take 1 capsule (180 mg total) by mouth daily.  90 capsule  3  . folic acid (FOLVITE) 1 MG tablet Take 1 tablet (1 mg total) by mouth daily.  90 tablet  1   . levETIRAcetam (KEPPRA) 750 MG tablet Take 2 tablets (1,500 mg total) by mouth every 12 (twelve) hours.  360 tablet  3  . levothyroxine (SYNTHROID, LEVOTHROID) 50 MCG tablet Take 1 tablet (50 mcg total) by mouth daily.  90 tablet  3  . lidocaine (XYLOCAINE) 2 % solution Take 20 mLs by mouth every 3 (three) hours as needed. Oral pain      . Multiple Vitamin (MULTIVITAMIN WITH MINERALS) TABS Take 1 tablet by mouth daily.      Marland Kitchen PHENobarbital (LUMINAL) 97.2 MG tablet Take 1 tablet (97.2 mg total) by mouth daily.  90 tablet  1  . pravastatin (PRAVACHOL) 40 MG tablet Take 1 tablet (40 mg total) by mouth every evening.  90 tablet  3  . vitamin C (ASCORBIC ACID) 500 MG tablet Take 500 mg by mouth daily.      . mesalamine (APRISO) 0.375 G 24 hr capsule 4 tablets daily  120 capsule  3   No current facility-administered medications for this visit.    No Known Allergies  Family History  Problem Relation Age of Onset  . Coronary artery disease Mother   . Heart disease Mother   . Diabetes Sister   . Breast cancer Maternal Aunt   . Breast cancer Sister     History  Substance Use Topics  . Smoking status: Former Smoker -- 40 years    Quit date: 10/26/2004  . Smokeless tobacco: Never Used     Comment: quit in 2007  . Alcohol Use: 4.2 oz/week    7 Glasses of wine per week     Comment: normally 1 glass wine after dinner -     ROS: As per history of present illness, otherwise negative  BP 108/60  Pulse 96  Ht 5\' 4"  (1.626 m)  Wt 120 lb 8 oz (54.658 kg)  BMI 20.67 kg/m2 Constitutional: Well-developed and thin female in no acute distress HEENT: Normocephalic and atraumatic. Oropharynx is clear and moist. No oropharyngeal exudate. Conjunctivae are normal.  No scleral icterus. Neck: Neck supple. Stoma in place anteriorly with scarring Cardiovascular: Normal rate, regular rhythm and intact distal pulses.  Pulmonary/chest: Effort normal and breath sounds normal. No wheezing, rales or  rhonchi. Abdominal: Soft, nontender, thin, nondistended. Bowel sounds active throughout. Extremities: no clubbing, cyanosis, or edema Lymphadenopathy: No cervical adenopathy noted. Neurological: Alert and oriented to person place and time. Skin: Skin is  warm and dry. No rashes noted. Psychiatric: Normal mood and affect. Behavior is normal.  RELEVANT LABS AND IMAGING: CBC    Component Value Date/Time   WBC 10.1 06/11/2013 1716   RBC 4.09 06/11/2013 1716   RBC 2.91* 10/30/2011 0502   HGB 13.0 06/11/2013 1716   HCT 39.2 06/11/2013 1716   PLT 260.0 06/11/2013 1716   MCV 96.0 06/11/2013 1716   MCH 31.3 10/31/2012 1215   MCHC 33.2 06/11/2013 1716   RDW 13.6 06/11/2013 1716   LYMPHSABS 0.9 10/02/2012 1518   MONOABS 1.0 10/02/2012 1518   EOSABS 0.0 10/02/2012 1518   BASOSABS 0.0 10/02/2012 1518    CMP     Component Value Date/Time   NA 134* 06/11/2013 1716   K 5.5* 06/11/2013 1716   CL 96 06/11/2013 1716   CO2 30 06/11/2013 1716   GLUCOSE 117* 06/11/2013 1716   BUN 10 06/11/2013 1716   CREATININE 0.6 06/11/2013 1716   CALCIUM 9.3 06/11/2013 1716   PROT 7.6 02/04/2013 1449   ALBUMIN 4.1 02/04/2013 1449   AST 17 02/04/2013 1449   ALT 16 02/04/2013 1449   ALKPHOS 132* 02/04/2013 1449   BILITOT 0.5 02/04/2013 1449   GFRNONAA 84* 10/31/2012 0535   GFRAA >90 10/31/2012 0535   Flex sig, 06/03/2011 -- moderate to severe colitis. Biopsies consistent with chronic active ulcerative colitis no dysplasia Colonoscopy, 11/09/2009 -- Dr. Sheryn Bison, examined the cecum, prep excellent. Normal colon other than redundant colon.  No polyps or cancer seen (indication history of adenomatous polyps). Recommendation repeat colonoscopy 5 years  ASSESSMENT/PLAN: 72 year old female with a complicated past medical history including ulcerative colitis, previously on Remicade, history of C. difficile colitis, history of laryngeal cancer status post initial resection followed by laryngectomy and radiation, status post hip fracture and  shoulder fracture due to a fall, who is seen in followup  1.  UC -- symptomatically she appears to be in remission. Her hemoglobin has improved which is a good indicator that she is not having significant bleeding.  She has been maintained only on a 4.8 g daily but this is becoming cost prohibitive. They are in a doughnut hole associated with her Medicare coverage and asked for a cheaper medication.  I do think she needs maintenance therapy and have prescribed Apriso 1.5 g daily (4 capsules). She will discontinue Lialda after completing her current supply and replace with Apriso.  We also talked about assessing activity in the colon to this point to ensure there is endoscopic healing to go with her symptomatic improvement. We will plan flexible sigmoidoscopy next week in the hospital setting. The procedure was discussed today with both she and her husband and they're agreeable to proceed.  2.  Hx of adenomas -- she would be due colonoscopy for this purpose in 2016  3.  Hyperkalemia --  elevated on day of appointment, will check basic metabolic panel again. If still elevated refer to primary care

## 2013-06-21 NOTE — Transfer of Care (Signed)
Immediate Anesthesia Transfer of Care Note  Patient: [name]  Procedure(s) Performed: Procedure(s): FLEXIBLE SIGMOIDOSCOPY (N/A)  Patient Location: PACU  Anesthesia Type:MAC  Level of Consciousness: sedated  Airway & Oxygen Therapy: Patient Spontanous Breathing and Patient connected to face mask oxygen  Post-op Assessment: Report given to PACU RN and Post -op Vital signs reviewed and stable  Post vital signs: Reviewed and stable  Complications: No apparent anesthesia complications

## 2013-06-21 NOTE — Progress Notes (Signed)
Two fleet enemas given.  1st result [name] liquid small amt.  2nd moderate to large mat return.  Still [name] liquid.  Tolerated well.  Offered no complaints.

## 2013-06-21 NOTE — Anesthesia Postprocedure Evaluation (Signed)
  Anesthesia Post-op Note  Patient: [name]  Procedure(s) Performed: Procedure(s) (LRB): FLEXIBLE SIGMOIDOSCOPY (N/A)  Patient Location: PACU  Anesthesia Type: MAC  Level of Consciousness: awake and alert   Airway and Oxygen Therapy: Patient Spontanous Breathing  Post-op Pain: mild  Post-op Assessment: Post-op Vital signs reviewed, Patient's Cardiovascular Status Stable, Respiratory Function Stable, Patent Airway and No signs of Nausea or vomiting  Last Vitals:  Filed Vitals:   06/21/13 0950  BP: 119/62  Temp:   Resp: 17    Post-op Vital Signs: stable   Complications: No apparent anesthesia complications

## 2013-06-21 NOTE — Op Note (Signed)
[facility] [address] [place], [address]   FLEXIBLE SIGMOIDOSCOPY PROCEDURE REPORT  PATIENT: [name], [name]  MR#: [id number] BIRTHDATE: 1940/11/13 , 72  yrs. old GENDER: Female ENDOSCOPIST: Beverley Fiedler, MD PROCEDURE DATE:  06/21/2013 PROCEDURE:   Sigmoidoscopy with biopsy ASA CLASS:   Class III INDICATIONS:follow up for previously diagnosed ulcerative colitis. MEDICATIONS: MAC sedation, administered by CRNA and See Anesthesia Report.  DESCRIPTION OF PROCEDURE:   After the risks benefits and alternatives of the procedure were thoroughly explained, informed consent was obtained.  Rectal examination revealed no rectal masses. The EC-[id number] ([id number])  endoscope was introduced through the anus  and advanced to the proximal sigmoid colon, without limitation. No adverse events experienced.   The quality of the prep was good .  The instrument was then slowly withdrawn as the mucosa was fully examined.     COLON FINDINGS: There was very mild granularity in the sigmoid mucosa with no erythema or ulcerations.  The rectal mucosa appeared normal.  Random biopsies were performed with cold forceps in the sigmoid and rectum.  Retroflexed views revealed hypertrophied anal papilla without other abnormalities.    The scope was then withdrawn from the patient and the procedure terminated.  COMPLICATIONS: There were no complications.  ENDOSCOPIC IMPRESSION: 1.  Very mild granularity in the sigmoid colon, random biopsies 2.  Dramatic improvement in previously examined ulcerative colitis  RECOMMENDATIONS: 1.  Await biopsy results 2.  Continue Asacol HD at 4.8 grams daily in divided doses  eSigned:  Beverley Fiedler, MD 06/21/2013 9:19 AM   CC:The Patient Jacques Navy, MD

## 2013-06-24 ENCOUNTER — Encounter (HOSPITAL_COMMUNITY): Payer: Self-pay | Admitting: Internal Medicine

## 2013-07-01 ENCOUNTER — Encounter: Payer: Self-pay | Admitting: Internal Medicine

## 2013-07-22 ENCOUNTER — Other Ambulatory Visit (HOSPITAL_COMMUNITY): Payer: Self-pay | Admitting: Orthopaedic Surgery

## 2013-07-22 DIAGNOSIS — B999 Unspecified infectious disease: Secondary | ICD-10-CM

## 2013-07-23 ENCOUNTER — Other Ambulatory Visit: Payer: Self-pay | Admitting: Physician Assistant

## 2013-07-23 ENCOUNTER — Other Ambulatory Visit: Payer: Self-pay | Admitting: Orthopaedic Surgery

## 2013-07-24 ENCOUNTER — Other Ambulatory Visit (HOSPITAL_COMMUNITY): Payer: Self-pay | Admitting: Orthopaedic Surgery

## 2013-07-24 ENCOUNTER — Telehealth: Payer: Self-pay | Admitting: *Deleted

## 2013-07-24 ENCOUNTER — Ambulatory Visit (HOSPITAL_COMMUNITY)
Admission: RE | Admit: 2013-07-24 | Discharge: 2013-07-24 | Disposition: A | Discharge status: Home / Self Care | Payer: Medicare Other | Source: Ambulatory Visit | Attending: Orthopaedic Surgery | Admitting: Orthopaedic Surgery

## 2013-07-24 DIAGNOSIS — B999 Unspecified infectious disease: Secondary | ICD-10-CM

## 2013-07-24 DIAGNOSIS — T8140XA Infection following a procedure, unspecified, initial encounter: Secondary | ICD-10-CM | POA: Insufficient documentation

## 2013-07-24 DIAGNOSIS — Y849 Medical procedure, unspecified as the cause of abnormal reaction of the patient, or of later complication, without mention of misadventure at the time of the procedure: Secondary | ICD-10-CM | POA: Insufficient documentation

## 2013-07-24 NOTE — Telephone Encounter (Signed)
Husband just wanted [place] to know pt has had a problem with an old scar where she had shoulder surgery. One pm about 2 weeks ago they noticed 2 areas that looked like hematomas and the next am made an appt with Dr Rhona Raider. Before they go there the areas burst. The areas were cx and grew enterococcus? And now pt is on IV Vanc for 4 weeks. She had a PICC line put in today. He wonders if the ESR being hi on 06/11/13 was a sign of the problem.

## 2013-07-24 NOTE — Telephone Encounter (Signed)
ESR is very nonspecific, but it is a marker of inflammation.  It does not localize any certain spot of inflammation. I cannot say the answer is no, but  there is no way to predict an abscess based on an elevated ESR I hope she does well and please have him call with any more questions or concerns

## 2013-07-24 NOTE — Procedures (Signed)
Successful placement of dual lumen PICC line to left basilic vein. Length 36cm Tip at lower SVC/RA No complications Ready for use.   Ascencion Dike PA-C Interventional Radiology 07/24/2013 12:21 PM

## 2013-07-25 NOTE — Telephone Encounter (Signed)
Informed Mr Burda of Dr Vena Rua information; he stated understanding.

## 2013-08-05 ENCOUNTER — Encounter: Payer: Self-pay | Admitting: Neurology

## 2013-08-05 ENCOUNTER — Ambulatory Visit (INDEPENDENT_AMBULATORY_CARE_PROVIDER_SITE_OTHER): Payer: Medicare Other | Admitting: Neurology

## 2013-08-05 VITALS — BP 126/66 | HR 96 | Wt 123.0 lb

## 2013-08-05 DIAGNOSIS — R569 Unspecified convulsions: Secondary | ICD-10-CM

## 2013-08-05 NOTE — Progress Notes (Signed)
Reason for visit: Seizures  [name] is an 73 y.o. female  History of present illness:  [name] is a 73 year old left-handed white female with a history of an intracranial hemorrhage and subsequent seizures. The patient has recently had some issues with an infection involving the right shoulder. The patient is on IV antibiotics currently. The patient has not had any seizures since April of 2014. The patient remains on Keppra and phenobarbital, and she is tolerating the medication well. The patient runs chronic hyponatremia, but the recent sodium levels have been near normal at 133. The patient denies any other significant medical issues since last seen other than she has had some surgery on her right foot for hammertoes. The patient returns for an evaluation.  Past Medical History  Diagnosis Date  . Peripheral vascular disease   . Subdural hematoma   . Ataxia   . Mild dysplasia of cervix   . UTI (lower urinary tract infection)   . Urinary incontinence   . Cerebrovascular disease   . Personal history of colonic polyps     adenomatous 1997 & tubular adenoma and hyplastic  2008  . Hyperlipidemia     takes Pravastatin daily  . H/O alcohol abuse   . Diverticulosis of colon (without mention of hemorrhage)   . Redundant colon   . C. difficile colitis   . Dementia t  . Ulcerative colitis     takes [place] daily  . Dysrhythmia     a fib-takes Diltiazem daily  . Seizures     takes Phenobarbital and Keppra daily;last seizure was April 1,2014  . Stroke 2000    off balance on right side a little and peripheral vision loss on right side  . Arthritis   . Joint pain   . Joint swelling   . History of UTI 10-2012  . Hypothyroidism     takes Levothyroxine daily  . Squamous cell carcinoma of mouth   . Adenocarcinoma, breast     bilateral  . Anxiety   . Dementia   . Cancer of larynx   . Hypertension   . Atrial fibrillation   . History of breast cancer   . History of  seizures   . Gait disorder   . Oropharyngeal cancer   . History of colitis     Past Surgical History  Procedure Laterality Date  . Subdural hematoma evacuation via craniotomy    . Oral surgery for squamous cell carcinoma of the mouth      x 3  . Multiple tooth extractions      due to oral cancer  . Cataract extraction, bilateral    . Breast lumpectomy      left breast with radiation therapy  . Carotid endarterectomy      left  . Mastectomy      Right, history with nodule dissection  . Orif hip fracture  Sept '12    Right hip: screw and plate repair.  . Larynx surgery  08/2010    [facility]  . Tubal ligation    . Colonoscopy    . Orif humerus fracture Right 10/30/2012    Procedure: OPEN REDUCTION INTERNAL FIXATION (ORIF) HUMERAL SHAFT FRACTURE;  Surgeon: Hessie Dibble, MD;  Location: [place];  Service: Orthopedics;  Laterality: Right;  BIG C-ARM, SHOULDER POSITION  . Hammer toe surgery Right 03/2013    pins and screws  . Flexible sigmoidoscopy N/A 06/21/2013    Procedure: FLEXIBLE SIGMOIDOSCOPY;  Surgeon:  Jerene Bears, MD;  Location: [facility];  Service: Gastroenterology;  Laterality: N/A;    Family History  Problem Relation Age of Onset  . Coronary artery disease Mother   . Heart disease Mother   . Diabetes Sister   . Breast cancer Maternal Aunt   . Breast cancer Sister     Social history:  reports that she quit smoking about 8 years ago. She has never used smokeless tobacco. She reports that she drinks about 4.2 ounces of alcohol per week. She reports that she does not use illicit drugs.   No Known Allergies  Medications:  Current Outpatient Prescriptions on File Prior to Visit  Medication Sig Dispense Refill  . aspirin 81 MG tablet Take 81 mg by mouth daily.      . Calcium Carbonate-Vitamin D (CALCIUM 500 + D PO) Take 1 tablet by mouth daily.      Marland Kitchen diltiazem (DILACOR XR) 180 MG 24 hr capsule Take 180 mg by mouth every morning.      . folic acid (FOLVITE) [id number]  MCG tablet Take 400 mcg by mouth daily.      Marland Kitchen levETIRAcetam (KEPPRA) 750 MG tablet Take 1,500 mg by mouth 2 (two) times daily.      Marland Kitchen levothyroxine (SYNTHROID, LEVOTHROID) 50 MCG tablet Take 50 mcg by mouth every evening.      . lidocaine (XYLOCAINE) 2 % solution Take 20 mLs by mouth every 3 (three) hours as needed for mouth pain. Oral pain      . Mesalamine (ASACOL HD) 800 MG TBEC Take 2 tablets (1,600 mg total) by mouth 3 (three) times daily.  144 tablet  0  . Multiple Vitamin (MULTIVITAMIN WITH MINERALS) TABS Take 1 tablet by mouth daily.      Marland Kitchen PHENobarbital (LUMINAL) 97.2 MG tablet Take 97.2 mg by mouth every morning.      . pravastatin (PRAVACHOL) 40 MG tablet Take 40 mg by mouth every evening.      . vitamin C (ASCORBIC ACID) 500 MG tablet Take 500 mg by mouth daily.       No current facility-administered medications on file prior to visit.    ROS:  Out of a complete 14 system review of symptoms, the patient complains only of the following symptoms, and all other reviewed systems are negative.  Walking difficulties History of seizures  Blood pressure 126/66, pulse 96, weight 123 lb (55.792 kg).  Physical Exam  General: The patient is alert and cooperative at the time of the examination.  Skin: No significant peripheral edema is noted.   Neurologic Exam  Mental status: The patient is oriented x 3.  Cranial nerves: Facial symmetry is present. Speech is generated with an electrolarynx.  Extraocular movements are full. Visual fields are notable for a right homonymous visual field deficit.  Motor: The patient has good strength in all 4 extremities.  Sensory examination: Soft touch sensation is symmetric on the face, arms, and legs.  Coordination: The patient has good finger-nose-finger and heel-to-shin bilaterally.  Gait and station: The patient has a slightly wide-based gait. Tandem gait is unsteady. Romberg is negative. No drift is seen.  Reflexes: Deep tendon reflexes  are symmetric.   Assessment/Plan:  One. History seizures  2. History of intracranial hemorrhage  3. Chronic hyponatremia  The patient is stable currently on the [place]. We will continue medications for now. The patient will followup through this office in 6-8 months. The patient also remains on phenobarbital. The patient has been  running sodium levels of around 133.  Jill Alexanders MD 08/05/2013 7:17 PM  [facility] [address] [place], [place] [address]  Phone [phone] Fax [phone]

## 2013-08-05 NOTE — Patient Instructions (Signed)
Epilepsy Epilepsy is a disorder in which a person has repeated seizures over time. A seizure is a release of abnormal electrical activity in the brain. Seizures can cause a change in attention, behavior, or the ability to remain awake and alert (altered mental status). Seizures often involve uncontrollable shaking (convulsions).  Most people with epilepsy lead normal lives. However, people with epilepsy are at an increased risk of falls, accidents, and injuries. Therefore, it is important to begin treatment right away. CAUSES  Epilepsy has many possible causes. Anything that disturbs the normal pattern of brain cell activity can lead to seizures. This may include:   Head injury.  Birth trauma.  High fever as a child.  Stroke.  Bleeding into or around the brain.  Certain drugs.  Prolonged low oxygen, such as what occurs after CPR efforts.  Abnormal brain development.  Certain illnesses, such as meningitis, encephalitis (brain infection), malaria, and other infections.  An imbalance of nerve signaling chemicals (neurotransmitters).  SIGNS AND SYMPTOMS  The symptoms of a seizure can vary greatly from one person to another. Right before a seizure, you may have a warning (aura) that a seizure is about to occur. An aura may include the following symptoms:  Fear or anxiety.  Nausea.  Feeling like the room is spinning (vertigo).  Vision changes, such as seeing flashing lights or spots. Common symptoms during a seizure include:  Abnormal sensations, such as an abnormal smell or a bitter taste in the mouth.   Sudden, general body stiffness.   Convulsions that involve rhythmic jerking of the face, arm, or leg on one or both sides.   Sudden change in consciousness.   Appearing to be awake but not responding.   Appearing to be asleep but cannot be awakened.   Grimacing, chewing, lip smacking, drooling, tongue biting, or loss of bowel or bladder control. After a seizure,  you may feel sleepy for a while. DIAGNOSIS  Your health care provider will ask about your symptoms and take a medical history. Descriptions from any witnesses to your seizures will be very helpful in the diagnosis. A physical exam, including a detailed neurological exam, is necessary. Various tests may be done, such as:   An electroencephalogram (EEG). This is a painless test of your brain waves. In this test, a diagram is created of your brain waves. These diagrams can be interpreted by a specialist.  An MRI of the brain.   A CT scan of the brain.   A spinal tap (lumbar puncture, LP).  Blood tests to check for signs of infection or abnormal blood chemistry. TREATMENT  There is no cure for epilepsy, but it is generally treatable. Once epilepsy is diagnosed, it is important to begin treatment as soon as possible. For most people with epilepsy, seizures can be controlled with medicines. The following may also be used:  A pacemaker for the brain (vagus nerve stimulator) can be used for people with seizures that are not well controlled by medicine.  Surgery on the brain. For some people, epilepsy eventually goes away. HOME CARE INSTRUCTIONS   Follow your health care provider's recommendations on driving and safety in normal activities.  Get enough rest. Lack of sleep can cause seizures.  Only take over-the-counter or prescription medicines as directed by your health care provider. Take any prescribed medicine exactly as directed.  Avoid any known triggers of your seizures.  Keep a seizure diary. Record what you recall about any seizure, especially any possible trigger.   Make   sure the people you live and work with know that you are prone to seizures. They should receive instructions on how to help you. In general, a witness to a seizure should:   Cushion your head and body.   Turn you on your side.   Avoid unnecessarily restraining you.   Not place anything inside your  mouth.   Call for emergency medical help if there is any question about what has occurred.   Follow up with your health care provider as directed. You may need regular blood tests to monitor the levels of your medicine.  SEEK MEDICAL CARE IF:   You develop signs of infection or other illness. This might increase the risk of a seizure.   You seem to be having more frequent seizures.   Your seizure pattern is changing.  SEEK IMMEDIATE MEDICAL CARE IF:   You have a seizure that does not stop after a few moments.   You have a seizure that causes any difficulty in breathing.   You have a seizure that results in a very severe headache.   You have a seizure that leaves you with the inability to speak or use a part of your body.  Document Released: 06/20/2005 Document Revised: 04/10/2013 Document Reviewed: 01/30/2013 [employer] Patient Information 2014 [employer].  

## 2013-09-06 ENCOUNTER — Encounter: Payer: Self-pay | Admitting: Internal Medicine

## 2013-09-06 ENCOUNTER — Other Ambulatory Visit: Payer: Medicare Other

## 2013-09-06 ENCOUNTER — Ambulatory Visit (INDEPENDENT_AMBULATORY_CARE_PROVIDER_SITE_OTHER): Payer: Medicare Other | Admitting: Internal Medicine

## 2013-09-06 VITALS — BP 118/72 | HR 78 | Temp 98.0°F | Wt 122.2 lb

## 2013-09-06 DIAGNOSIS — R300 Dysuria: Secondary | ICD-10-CM

## 2013-09-06 DIAGNOSIS — N3090 Cystitis, unspecified without hematuria: Secondary | ICD-10-CM

## 2013-09-06 LAB — POCT URINALYSIS DIPSTICK
BILIRUBIN UA: NEGATIVE
Glucose, UA: NEGATIVE
Nitrite, UA: POSITIVE
PH UA: 6.5
Protein, UA: 300
Spec Grav, UA: 1.015
Urobilinogen, UA: 0.2

## 2013-09-06 MED ORDER — SULFAMETHOXAZOLE-TMP DS 800-160 MG PO TABS: 1.0000 | ORAL_TABLET | Freq: Two times a day (BID) | ORAL | Status: DC

## 2013-09-06 NOTE — Patient Instructions (Signed)
Cystitis - positive urinalysis  Plan Will send urine for culture  Start Septra DS twice a day for 7 days, subject to change based on culture.

## 2013-09-06 NOTE — Progress Notes (Signed)
Pre visit review using our clinic review tool, if applicable. No additional management support is needed unless otherwise documented below in the visit note. 

## 2013-09-06 NOTE — Progress Notes (Signed)
Subjective:    Patient ID: [name], female    DOB: [birth year], 73 y.o.   MRN: [id number]  HPI [name] had a problem with enterococcal abscess right arm. This had been a problem since last August . She was treated with 10 days oral antibiotics. She had a recurrence late December and Jan 5th saw Dr. Latanya Maudlin for abscess and she was put on antibiotics (keflex) but when cultures came back positive for enterococcus she was treated x 4 weeks with IV vancomycin and at the end treatment, Feb 18th,  the blood cultures were negative. Five days prior to this visit she started having dysuria. Dip U/A is positive.  Past Medical History  Diagnosis Date  . Peripheral vascular disease   . Subdural hematoma   . Ataxia   . Mild dysplasia of cervix   . UTI (lower urinary tract infection)   . Urinary incontinence   . Cerebrovascular disease   . Personal history of colonic polyps     adenomatous 1997 & tubular adenoma and hyplastic  2008  . Hyperlipidemia     takes Pravastatin daily  . H/O alcohol abuse   . Diverticulosis of colon (without mention of hemorrhage)   . Redundant colon   . C. difficile colitis   . Dementia t  . Ulcerative colitis     takes [place] daily  . Dysrhythmia     a fib-takes Diltiazem daily  . Seizures     takes Phenobarbital and Keppra daily;last seizure was April 1,2014  . Stroke 2000    off balance on right side a little and peripheral vision loss on right side  . Arthritis   . Joint pain   . Joint swelling   . History of UTI 10-2012  . Hypothyroidism     takes Levothyroxine daily  . Squamous cell carcinoma of mouth   . Adenocarcinoma, breast     bilateral  . Anxiety   . Dementia   . Cancer of larynx   . Hypertension   . Atrial fibrillation   . History of breast cancer   . History of seizures   . Gait disorder   . Oropharyngeal cancer   . History of colitis    Past Surgical History  Procedure Laterality Date  . Subdural hematoma evacuation via  craniotomy    . Oral surgery for squamous cell carcinoma of the mouth      x 3  . Multiple tooth extractions      due to oral cancer  . Cataract extraction, bilateral    . Breast lumpectomy      left breast with radiation therapy  . Carotid endarterectomy      left  . Mastectomy      Right, history with nodule dissection  . Orif hip fracture  Sept '12    Right hip: screw and plate repair.  . Larynx surgery  08/2010    [facility]  . Tubal ligation    . Colonoscopy    . Orif humerus fracture Right 10/30/2012    Procedure: OPEN REDUCTION INTERNAL FIXATION (ORIF) HUMERAL SHAFT FRACTURE;  Surgeon: Hessie Dibble, MD;  Location: [place];  Service: Orthopedics;  Laterality: Right;  BIG C-ARM, SHOULDER POSITION  . Hammer toe surgery Right 03/2013    pins and screws  . Flexible sigmoidoscopy N/A 06/21/2013    Procedure: FLEXIBLE SIGMOIDOSCOPY;  Surgeon: Jerene Bears, MD;  Location: [facility];  Service: Gastroenterology;  Laterality: N/A;  Family History  Problem Relation Age of Onset  . Coronary artery disease Mother   . Heart disease Mother   . Diabetes Sister   . Breast cancer Maternal Aunt   . Breast cancer Sister    History   Social History  . Marital Status: Married    Spouse Name: N/A    Number of Children: 2  . Years of Education: 12   Occupational History  . retired     Art therapist   Social History Main Topics  . Smoking status: Former Smoker -- 70 years    Quit date: 10/26/2004  . Smokeless tobacco: Never Used     Comment: quit in 10-24-2005  . Alcohol Use: 4.2 oz/week    7 Glasses of wine per week     Comment: normally 1 glass wine after dinner -   . Drug Use: No  . Sexual Activity: No   Other Topics Concern  . Not on file   Social History Narrative   HSG. Married x 7 years divorced; married 25-Oct-2067. 1 son- died as a neonate, 1 son- 10-25-2058. Retired- worked as a Art therapist.   End of life: has a living will- does not want futile/ heroic care; no cpr.      Marriage- reports marriage is in good health (4/10)                Current Outpatient Prescriptions on File Prior to Visit  Medication Sig Dispense Refill  . aspirin 81 MG tablet Take 81 mg by mouth daily.      . Calcium Carbonate-Vitamin D (CALCIUM 500 + D PO) Take 1 tablet by mouth daily.      Marland Kitchen diltiazem (DILACOR XR) 180 MG 24 hr capsule Take 180 mg by mouth every morning.      . folic acid (FOLVITE) A999333 MCG tablet Take 400 mcg by mouth daily.      Marland Kitchen levETIRAcetam (KEPPRA) 750 MG tablet Take 1,500 mg by mouth 2 (two) times daily.      Marland Kitchen levothyroxine (SYNTHROID, LEVOTHROID) 50 MCG tablet Take 50 mcg by mouth every evening.      . lidocaine (XYLOCAINE) 2 % solution Take 20 mLs by mouth every 3 (three) hours as needed for mouth pain. Oral pain      . Mesalamine (ASACOL HD) 800 MG TBEC Take 2 tablets (1,600 mg total) by mouth 3 (three) times daily.  144 tablet  0  . Multiple Vitamin (MULTIVITAMIN WITH MINERALS) TABS Take 1 tablet by mouth daily.      Marland Kitchen PHENobarbital (LUMINAL) 97.2 MG tablet Take 97.2 mg by mouth every morning.      . pravastatin (PRAVACHOL) 40 MG tablet Take 40 mg by mouth every evening.      . vitamin C (ASCORBIC ACID) 500 MG tablet Take 500 mg by mouth daily.       No current facility-administered medications on file prior to visit.      Review of Systems System review is negative for any constitutional, cardiac, pulmonary, GI or neuro symptoms or complaints other than as described in the HPI.     Objective:   Physical Exam Filed Vitals:   09/06/13 0931  BP: 118/72  Pulse: 78  Temp: 98 F (36.7 C)   Gen'l - WNWD Cor - 2+ RRR PUlm - CTAP Abd - mild tenderness suprapubic area  U/A - dip positive       Assessment & Plan:  Cystitis - positive urinalysis  Plan  Will send urine for culture  Start Septra DS twice a day for 7 days, subject to change based on culture.  Addendum - culture with >100,000 colonoies E.Coli, ss pending

## 2013-09-09 LAB — CULTURE, URINE COMPREHENSIVE: Colony Count: >=100000

## 2013-10-02 HISTORY — PX: SHOULDER SURGERY: SHX246

## 2013-11-04 ENCOUNTER — Other Ambulatory Visit: Payer: Self-pay | Admitting: *Deleted

## 2013-11-04 MED ORDER — LEVETIRACETAM 750 MG PO TABS: 1500.0000 mg | ORAL_TABLET | Freq: Two times a day (BID) | ORAL | Status: DC

## 2013-11-05 ENCOUNTER — Encounter: Payer: Self-pay | Admitting: Internal Medicine

## 2013-11-06 ENCOUNTER — Other Ambulatory Visit: Payer: Self-pay | Admitting: *Deleted

## 2013-11-06 MED ORDER — LEVETIRACETAM 750 MG PO TABS: 1500.0000 mg | ORAL_TABLET | Freq: Two times a day (BID) | ORAL | Status: DC

## 2013-11-08 ENCOUNTER — Telehealth: Payer: Self-pay | Admitting: Internal Medicine

## 2013-11-08 NOTE — Telephone Encounter (Signed)
Large amount of bright red blood in her stool today.  She will come in on Monday and see Dr. Hilarie Fredrickson.  History of UC.  Husband reports that the bleeding has been increasing for a few weeks.

## 2013-11-11 ENCOUNTER — Encounter: Payer: Self-pay | Admitting: Internal Medicine

## 2013-11-11 ENCOUNTER — Ambulatory Visit (INDEPENDENT_AMBULATORY_CARE_PROVIDER_SITE_OTHER): Payer: Medicare Other | Admitting: Internal Medicine

## 2013-11-11 ENCOUNTER — Telehealth: Payer: Self-pay | Admitting: Internal Medicine

## 2013-11-11 ENCOUNTER — Other Ambulatory Visit (INDEPENDENT_AMBULATORY_CARE_PROVIDER_SITE_OTHER): Payer: Medicare Other

## 2013-11-11 VITALS — BP 88/40 | HR 76 | Temp 99.9°F | Ht 65.0 in | Wt 124.2 lb

## 2013-11-11 DIAGNOSIS — R509 Fever, unspecified: Secondary | ICD-10-CM

## 2013-11-11 DIAGNOSIS — Z9889 Other specified postprocedural states: Secondary | ICD-10-CM

## 2013-11-11 DIAGNOSIS — L0291 Cutaneous abscess, unspecified: Secondary | ICD-10-CM

## 2013-11-11 DIAGNOSIS — K5190 Ulcerative colitis, unspecified, without complications: Secondary | ICD-10-CM

## 2013-11-11 DIAGNOSIS — L0390 Cellulitis, unspecified: Secondary | ICD-10-CM

## 2013-11-11 DIAGNOSIS — R55 Syncope and collapse: Secondary | ICD-10-CM

## 2013-11-11 LAB — IBC PANEL
IRON: 18 ug/dL — AB (ref 42–145)
SATURATION RATIOS: 6.3 % — AB (ref 20.0–50.0)
Transferrin: 205.6 mg/dL — ABNORMAL LOW (ref 212.0–360.0)

## 2013-11-11 LAB — COMPREHENSIVE METABOLIC PANEL
ALT: 15 U/L (ref 0–35)
AST: 15 U/L (ref 0–37)
Albumin: 3.9 g/dL (ref 3.5–5.2)
Alkaline Phosphatase: 82 U/L (ref 39–117)
BILIRUBIN TOTAL: 1.3 mg/dL — AB (ref 0.2–1.2)
BUN: 15 mg/dL (ref 6–23)
CO2: 30 mEq/L (ref 19–32)
CREATININE: 0.7 mg/dL (ref 0.4–1.2)
Calcium: 9.4 mg/dL (ref 8.4–10.5)
Chloride: 95 mEq/L — ABNORMAL LOW (ref 96–112)
GFR: 82.53 mL/min (ref >60.00)
Glucose, Bld: 117 mg/dL — ABNORMAL HIGH (ref 70–99)
Potassium: 3.7 mEq/L (ref 3.5–5.1)
Sodium: 133 mEq/L — ABNORMAL LOW (ref 135–145)
Total Protein: 8 g/dL (ref 6.0–8.3)

## 2013-11-11 LAB — CBC
HCT: 39 % (ref 36.0–46.0)
Hemoglobin: 13.3 g/dL (ref 12.0–15.0)
MCHC: 34 g/dL (ref 30.0–36.0)
MCV: 96.8 fl (ref 78.0–100.0)
Platelets: 175 10*3/uL (ref 150.0–400.0)
RBC: 4.03 Mil/uL (ref 3.87–5.11)
RDW: 14.3 % (ref 11.5–15.5)
WBC: 18.6 10*3/uL (ref 4.0–10.5)

## 2013-11-11 LAB — FERRITIN: Ferritin: 120.3 ng/mL (ref 10.0–291.0)

## 2013-11-11 NOTE — Telephone Encounter (Signed)
Patient was seen by the surgeon today.  She had xrays and the plate in her shoulder had deteriorated and will need to have th screws and plate removed most likely.  She was started on an antibiotics.  She will go back and see a shoulder specialist on Wed to determine what the next steps are for her shoulder.  Recall office visit placed for 3 months

## 2013-11-11 NOTE — Progress Notes (Signed)
Subjective:    Patient ID: [name], female    DOB: 15-Apr-1941, 73 y.o.   MRN: [id number]  HPI Mrs. Hoppe is a 73 year old female with a complicated medical history including ulcerative colitis, history of C. difficile colitis, history of laryngeal cancer status post laryngectomy with radiation, hip fracture and shoulder fracture with hardware in place who is seen in followup. She was last seen in December 2014 after which time she had a flexible sigmoidoscopy. This showed considerable improvement in her colitis with only mildly granular mucosa. Biopsies showed chronic minimally active colitis without dysplasia. She was taking Lialda but having trouble affording this medication and it was changed to Asacol HD, which unfortunately is quite expensive for them given that they are now in the donut hole. She is taking for 20 mg daily. About 3 weeks ago she had 5-7 days of more loose stools with small amounts of blood on the tissue and in her bowel movements. This was associated with increased fecal urgency, but the symptoms have resolved.  Unfortunately on Friday the patient fell in her bathroom. She was found by her husband she was lying against the wall days been disoriented. Her husband had a hard time waking her but took her pulse and found that she did have a pulse. He could not get her to respond for 2-3 minutes. After 5 minutes she was awake and able to stand and walk on her own. They did not seek medical care. Also over the weekend she is noted pain and increased redness and swelling of her right upper extremity. No documented fevers.  She reports being treated for an enterococcal skin infection associated with her right shoulder hardware. She was treated by her orthopedic MD, Dr. Rhona Raider initially with Bactrim but this was changed to IV vancomycin which she took for 4 weeks through PICC line. This completed around February 2015.  Today in the office she reports considerable pain in her  upper right arm. Again no documented fevers but fever was taken here and elevated at 99.9.  She was also found to be hypotensive without tachycardia.  The patient reports recently normal formed brown stool without blood. She denies abdominal pain. Reports good appetite. No trouble swallowing. She is eating a soft diet. She reports one bowel movement daily over the last week.   Review of Systems As per history of present illness, otherwise negative  Current Medications, Allergies, Past Medical History, Past Surgical History, Family History and Social History were reviewed in [employer] record.     Objective:   Physical Exam BP 88/40  Pulse 76  Temp(Src) 99.9 F (37.7 C)  Ht 5\' 5"  (1.651 m)  Wt 124 lb 3.2 oz (56.337 kg)  BMI 20.67 kg/m2 Constitutional: Well-developed and thin female in no acute distress  HEENT: Normocephalic and atraumatic. Oropharynx is clear and moist. Status post laryngectomy with tracheostomy Neck: Neck supple. Stoma in place anteriorly with scarring  Cardiovascular: Normal rate, regular rhythm and intact distal pulses.  Pulmonary/chest: Effort normal and breath sounds normal. Initially right lower lobe rhonchi clearing with coughing Abdominal: Soft, nontender, thin, nondistended. Bowel sounds active throughout.  Extremities: no clubbing, cyanosis, or edema in the lower extremities, significant right upper extremity redness, swelling, and increased warmth, tenderness to touch, this extends from the surgical incision in her right shoulder to her antecubital fossa Neurological: Alert and oriented to person place and time.  Skin: See above extremity exam Psychiatric: Normal mood and affect. Behavior is  normal.  CBC    Component Value Date/Time   WBC 10.1 06/11/2013 1716   RBC 4.09 06/11/2013 1716   RBC 2.91* 10/30/2011 0502   HGB 13.0 06/11/2013 1716   HCT 39.2 06/11/2013 1716   PLT 260.0 06/11/2013 1716   MCV 96.0 06/11/2013 1716   MCH 31.3  10/31/2012 1215   MCHC 33.2 06/11/2013 1716   RDW 13.6 06/11/2013 1716   LYMPHSABS 0.9 10/02/2012 1518   MONOABS 1.0 10/02/2012 1518   EOSABS 0.0 10/02/2012 1518   BASOSABS 0.0 10/02/2012 1518    WBC count called to office from today 18K     Assessment & Plan:  73 year old female with a complicated medical history including ulcerative colitis, history of C. difficile colitis, history of laryngeal cancer status post laryngectomy with radiation, hip fracture and shoulder fracture with hardware in place who is seen in followup.  1.  RUE cellulitis -- biggest issue today, and concerning given her fever, hypotension, and leukocytosis, especially in the setting of fairly recently treated enterococcal skin infection. I have contacted Dr. Jerald Kief office and he will see her immediately.  She is going from our office to his office now.  2.  UC -- much improved at the time of flexible sigmoidoscopy in December 2014. It does sound like she had a small flare of her colitis several weeks ago but this has apparently resolved. We will continue Asacol HD 4.8 g daily in 3 divided doses.  Samples given today.  I'm also checking iron studies today that with active infection these may be unreliable, especially in relation to ferritin.  No plans for further immunosuppression of present in light of #1 above.  3.  Syncopal episode Friday -- I am surprised he did not seek medical care Friday night, but apparently the patient did not want to. This sounds like an episode of syncope rather than seizure, though she does have a history of seizure disorder and is followed by Dr. Jannifer Franklin. My impression is that she had hypotensive episode when trying to stand in the bathroom, possibly related to ongoing infection see #1. She is seeking further care today. If this recurs she is to seek immediate medical attention and notify her neurologist. Husband and patient voice understanding and agreement

## 2013-11-11 NOTE — Patient Instructions (Signed)
You have an appointment at [facility] at 11:15am  Your physician has requested that you go to the basement for the following lab work before leaving today: CBC, Iron Studies, CMP  Stay on current dose of Asocol HD  Follow up with Dr. Hilarie Fredrickson in 3 months

## 2013-11-12 ENCOUNTER — Other Ambulatory Visit: Payer: Self-pay

## 2013-11-12 MED ORDER — INTEGRA PLUS PO CAPS: 1.0000 | ORAL_CAPSULE | Freq: Every day | ORAL | Status: DC

## 2013-11-13 ENCOUNTER — Encounter (HOSPITAL_COMMUNITY): Payer: Self-pay

## 2013-11-13 ENCOUNTER — Inpatient Hospital Stay (HOSPITAL_COMMUNITY)
Admission: AD | Admit: 2013-11-13 | Discharge: 2013-11-18 | DRG: 496 | Disposition: A | Discharge status: Home / Self Care | Payer: Medicare Other | Source: Ambulatory Visit | Attending: Orthopedic Surgery | Admitting: Orthopedic Surgery

## 2013-11-13 ENCOUNTER — Telehealth: Payer: Self-pay | Admitting: Internal Medicine

## 2013-11-13 ENCOUNTER — Encounter (HOSPITAL_COMMUNITY): Payer: Self-pay | Admitting: *Deleted

## 2013-11-13 DIAGNOSIS — Z803 Family history of malignant neoplasm of breast: Secondary | ICD-10-CM

## 2013-11-13 DIAGNOSIS — T847XXA Infection and inflammatory reaction due to other internal orthopedic prosthetic devices, implants and grafts, initial encounter: Secondary | ICD-10-CM | POA: Diagnosis present

## 2013-11-13 DIAGNOSIS — E039 Hypothyroidism, unspecified: Secondary | ICD-10-CM | POA: Diagnosis present

## 2013-11-13 DIAGNOSIS — Z85819 Personal history of malignant neoplasm of unspecified site of lip, oral cavity, and pharynx: Secondary | ICD-10-CM

## 2013-11-13 DIAGNOSIS — Z87891 Personal history of nicotine dependence: Secondary | ICD-10-CM

## 2013-11-13 DIAGNOSIS — E785 Hyperlipidemia, unspecified: Secondary | ICD-10-CM | POA: Diagnosis present

## 2013-11-13 DIAGNOSIS — I4891 Unspecified atrial fibrillation: Secondary | ICD-10-CM | POA: Diagnosis present

## 2013-11-13 DIAGNOSIS — K5190 Ulcerative colitis, unspecified, without complications: Secondary | ICD-10-CM | POA: Diagnosis present

## 2013-11-13 DIAGNOSIS — Z7982 Long term (current) use of aspirin: Secondary | ICD-10-CM

## 2013-11-13 DIAGNOSIS — Z853 Personal history of malignant neoplasm of breast: Secondary | ICD-10-CM

## 2013-11-13 DIAGNOSIS — D62 Acute posthemorrhagic anemia: Secondary | ICD-10-CM | POA: Diagnosis not present

## 2013-11-13 DIAGNOSIS — Z8673 Personal history of transient ischemic attack (TIA), and cerebral infarction without residual deficits: Secondary | ICD-10-CM

## 2013-11-13 DIAGNOSIS — Z9849 Cataract extraction status, unspecified eye: Secondary | ICD-10-CM

## 2013-11-13 DIAGNOSIS — M869 Osteomyelitis, unspecified: Secondary | ICD-10-CM

## 2013-11-13 DIAGNOSIS — Z833 Family history of diabetes mellitus: Secondary | ICD-10-CM

## 2013-11-13 DIAGNOSIS — R569 Unspecified convulsions: Secondary | ICD-10-CM | POA: Diagnosis present

## 2013-11-13 DIAGNOSIS — Z8249 Family history of ischemic heart disease and other diseases of the circulatory system: Secondary | ICD-10-CM

## 2013-11-13 MED ORDER — LACTATED RINGERS IV SOLN
INTRAVENOUS | Status: DC
  Administered 2013-11-13: via INTRAVENOUS
  Administered 2013-11-14: 75 mL/h via INTRAVENOUS

## 2013-11-13 MED ORDER — LEVETIRACETAM 750 MG PO TABS
1500.0000 mg | ORAL_TABLET | Freq: Two times a day (BID) | ORAL | Status: DC
  Administered 2013-11-13: 1500 mg via ORAL
  Filled 2013-11-13 (×3): qty 2

## 2013-11-13 MED ORDER — CHLORHEXIDINE GLUCONATE 4 % EX LIQD
60.0000 mL | Freq: Once | CUTANEOUS | Status: AC
  Administered 2013-11-14: 4 via TOPICAL
  Filled 2013-11-13: qty 60

## 2013-11-13 NOTE — Telephone Encounter (Signed)
FYI

## 2013-11-13 NOTE — Progress Notes (Signed)
Pt to be admitted today prior to surgery. Will not be a Same Day workup.

## 2013-11-14 ENCOUNTER — Inpatient Hospital Stay (HOSPITAL_COMMUNITY): Payer: Medicare Other | Admitting: Anesthesiology

## 2013-11-14 ENCOUNTER — Encounter (HOSPITAL_COMMUNITY): Payer: Self-pay | Admitting: Anesthesiology

## 2013-11-14 ENCOUNTER — Inpatient Hospital Stay (HOSPITAL_COMMUNITY): Payer: Medicare Other

## 2013-11-14 ENCOUNTER — Ambulatory Visit (HOSPITAL_COMMUNITY): Admission: RE | Admit: 2013-11-14 | Payer: Medicare Other | Source: Ambulatory Visit | Admitting: Orthopedic Surgery

## 2013-11-14 ENCOUNTER — Encounter (HOSPITAL_COMMUNITY): Payer: Medicare Other | Admitting: Anesthesiology

## 2013-11-14 ENCOUNTER — Encounter (HOSPITAL_COMMUNITY)
Admission: AD | Disposition: A | Discharge status: Home / Self Care | Payer: Self-pay | Source: Ambulatory Visit | Attending: Orthopedic Surgery

## 2013-11-14 DIAGNOSIS — Z8521 Personal history of malignant neoplasm of larynx: Secondary | ICD-10-CM

## 2013-11-14 DIAGNOSIS — Z853 Personal history of malignant neoplasm of breast: Secondary | ICD-10-CM

## 2013-11-14 DIAGNOSIS — Z85819 Personal history of malignant neoplasm of unspecified site of lip, oral cavity, and pharynx: Secondary | ICD-10-CM

## 2013-11-14 DIAGNOSIS — M869 Osteomyelitis, unspecified: Principal | ICD-10-CM

## 2013-11-14 DIAGNOSIS — T847XXA Infection and inflammatory reaction due to other internal orthopedic prosthetic devices, implants and grafts, initial encounter: Secondary | ICD-10-CM | POA: Diagnosis present

## 2013-11-14 HISTORY — PX: INCISION AND DRAINAGE: SHX5863

## 2013-11-14 HISTORY — PX: HARDWARE REMOVAL: SHX979

## 2013-11-14 LAB — CBC WITH DIFFERENTIAL/PLATELET
BASOS ABS: 0 10*3/uL (ref 0.0–0.1)
BASOS PCT: 0 % (ref 0–1)
Eosinophils Absolute: 0.2 10*3/uL (ref 0.0–0.7)
Eosinophils Relative: 2 % (ref 0–5)
HEMATOCRIT: 31 % — AB (ref 36.0–46.0)
Hemoglobin: 10.8 g/dL — ABNORMAL LOW (ref 12.0–15.0)
LYMPHS PCT: 14 % (ref 12–46)
Lymphs Abs: 1.2 10*3/uL (ref 0.7–4.0)
MCH: 32.5 pg (ref 26.0–34.0)
MCHC: 34.8 g/dL (ref 30.0–36.0)
MCV: 93.4 fL (ref 78.0–100.0)
MONO ABS: 0.9 10*3/uL (ref 0.1–1.0)
Monocytes Relative: 12 % (ref 3–12)
Neutro Abs: 5.9 10*3/uL (ref 1.7–7.7)
Neutrophils Relative %: 72 % (ref 43–77)
PLATELETS: 160 10*3/uL (ref 150–400)
RBC: 3.32 MIL/uL — ABNORMAL LOW (ref 3.87–5.11)
RDW: 13.7 % (ref 11.5–15.5)
WBC: 8.1 10*3/uL (ref 4.0–10.5)

## 2013-11-14 LAB — C-REACTIVE PROTEIN: CRP: 7.9 mg/dL — AB (ref <0.60)

## 2013-11-14 LAB — SURGICAL PCR SCREEN
MRSA, PCR: NEGATIVE
Staphylococcus aureus: NEGATIVE

## 2013-11-14 LAB — SEDIMENTATION RATE: SED RATE: 86 mm/h — AB (ref 0–22)

## 2013-11-14 SURGERY — REMOVAL, HARDWARE
Anesthesia: General | Site: Shoulder | Laterality: Right

## 2013-11-14 MED ORDER — PROPOFOL 10 MG/ML IV BOLUS
INTRAVENOUS | Status: AC
  Filled 2013-11-14: qty 20

## 2013-11-14 MED ORDER — LIDOCAINE HCL (CARDIAC) 20 MG/ML IV SOLN
INTRAVENOUS | Status: AC
  Filled 2013-11-14: qty 5

## 2013-11-14 MED ORDER — ONDANSETRON HCL 4 MG/2ML IJ SOLN: 4.0000 mg | Freq: Four times a day (QID) | INTRAMUSCULAR | Status: DC | PRN

## 2013-11-14 MED ORDER — FENTANYL CITRATE 0.05 MG/ML IJ SOLN
INTRAMUSCULAR | Status: DC | PRN
  Administered 2013-11-14: 50 ug via INTRAVENOUS
  Administered 2013-11-14: 100 ug via INTRAVENOUS

## 2013-11-14 MED ORDER — ROCURONIUM BROMIDE 50 MG/5ML IV SOLN
INTRAVENOUS | Status: AC
  Filled 2013-11-14: qty 1

## 2013-11-14 MED ORDER — METOCLOPRAMIDE HCL 5 MG/ML IJ SOLN: 5.0000 mg | Freq: Three times a day (TID) | INTRAMUSCULAR | Status: DC | PRN

## 2013-11-14 MED ORDER — OXYCODONE HCL 5 MG PO TABS
5.0000 mg | ORAL_TABLET | Freq: Once | ORAL | Status: AC | PRN
  Administered 2013-11-14: 5 mg via ORAL

## 2013-11-14 MED ORDER — HYDROCODONE-ACETAMINOPHEN 5-325 MG PO TABS
1.0000 | ORAL_TABLET | ORAL | Status: DC | PRN
  Administered 2013-11-15 (×3): 1 via ORAL
  Filled 2013-11-14 (×3): qty 1

## 2013-11-14 MED ORDER — DIPHENHYDRAMINE HCL 12.5 MG/5ML PO ELIX: 12.5000 mg | ORAL_SOLUTION | ORAL | Status: DC | PRN

## 2013-11-14 MED ORDER — PHENOBARBITAL 32.4 MG PO TABS
97.2000 mg | ORAL_TABLET | Freq: Every morning | ORAL | Status: DC
  Administered 2013-11-14 – 2013-11-18 (×4): 97.2 mg via ORAL
  Filled 2013-11-14 (×4): qty 3

## 2013-11-14 MED ORDER — VANCOMYCIN HCL IN DEXTROSE 1-5 GM/200ML-% IV SOLN
1000.0000 mg | INTRAVENOUS | Status: DC
  Administered 2013-11-14 – 2013-11-18 (×5): 1000 mg via INTRAVENOUS
  Filled 2013-11-14 (×6): qty 200

## 2013-11-14 MED ORDER — ROCURONIUM BROMIDE 100 MG/10ML IV SOLN
INTRAVENOUS | Status: DC | PRN
  Administered 2013-11-14: 20 mg via INTRAVENOUS

## 2013-11-14 MED ORDER — BISACODYL 10 MG RE SUPP: 10.0000 mg | Freq: Every day | RECTAL | Status: DC | PRN

## 2013-11-14 MED ORDER — METOCLOPRAMIDE HCL 5 MG PO TABS
5.0000 mg | ORAL_TABLET | Freq: Three times a day (TID) | ORAL | Status: DC | PRN
  Filled 2013-11-14: qty 2

## 2013-11-14 MED ORDER — ASPIRIN EC 81 MG PO TBEC
81.0000 mg | DELAYED_RELEASE_TABLET | Freq: Every day | ORAL | Status: DC
  Administered 2013-11-15 – 2013-11-18 (×4): 81 mg via ORAL
  Filled 2013-11-14 (×5): qty 1

## 2013-11-14 MED ORDER — POLYETHYLENE GLYCOL 3350 17 G PO PACK: 17.0000 g | PACK | Freq: Every day | ORAL | Status: DC | PRN

## 2013-11-14 MED ORDER — SODIUM CHLORIDE 0.9 % IV SOLN: INTRAVENOUS | Status: DC

## 2013-11-14 MED ORDER — MEPERIDINE HCL 25 MG/ML IJ SOLN: 6.2500 mg | INTRAMUSCULAR | Status: DC | PRN

## 2013-11-14 MED ORDER — ARTIFICIAL TEARS OP OINT
TOPICAL_OINTMENT | OPHTHALMIC | Status: DC | PRN
  Administered 2013-11-14: 1 via OPHTHALMIC

## 2013-11-14 MED ORDER — GLYCOPYRROLATE 0.2 MG/ML IJ SOLN
INTRAMUSCULAR | Status: DC | PRN
  Administered 2013-11-14: 0.4 mg via INTRAVENOUS

## 2013-11-14 MED ORDER — LEVETIRACETAM 750 MG PO TABS
1500.0000 mg | ORAL_TABLET | Freq: Two times a day (BID) | ORAL | Status: DC
  Administered 2013-11-14 – 2013-11-18 (×9): 1500 mg via ORAL
  Filled 2013-11-14 (×10): qty 2

## 2013-11-14 MED ORDER — DOCUSATE SODIUM 100 MG PO CAPS: 100.0000 mg | ORAL_CAPSULE | Freq: Two times a day (BID) | ORAL | Status: DC

## 2013-11-14 MED ORDER — DILTIAZEM HCL ER 180 MG PO CP24
180.0000 mg | ORAL_CAPSULE | Freq: Every morning | ORAL | Status: DC
  Administered 2013-11-14 – 2013-11-18 (×5): 180 mg via ORAL
  Filled 2013-11-14 (×5): qty 1

## 2013-11-14 MED ORDER — DEXTROSE 5 % IV SOLN
10.0000 mg | INTRAVENOUS | Status: DC | PRN
  Administered 2013-11-14: 20 ug/min via INTRAVENOUS

## 2013-11-14 MED ORDER — GLYCOPYRROLATE 0.2 MG/ML IJ SOLN
INTRAMUSCULAR | Status: AC
  Filled 2013-11-14: qty 2

## 2013-11-14 MED ORDER — FLEET ENEMA 7-19 GM/118ML RE ENEM: 1.0000 | ENEMA | Freq: Once | RECTAL | Status: AC | PRN

## 2013-11-14 MED ORDER — NEOSTIGMINE METHYLSULFATE 10 MG/10ML IV SOLN
INTRAVENOUS | Status: AC
  Filled 2013-11-14: qty 1

## 2013-11-14 MED ORDER — OXYCODONE HCL 5 MG/5ML PO SOLN: 5.0000 mg | Freq: Once | ORAL | Status: AC | PRN

## 2013-11-14 MED ORDER — MIDAZOLAM HCL 2 MG/2ML IJ SOLN
INTRAMUSCULAR | Status: AC
  Filled 2013-11-14: qty 2

## 2013-11-14 MED ORDER — ONDANSETRON HCL 4 MG/2ML IJ SOLN
INTRAMUSCULAR | Status: DC | PRN
  Administered 2013-11-14: 4 mg via INTRAVENOUS

## 2013-11-14 MED ORDER — ATORVASTATIN CALCIUM 10 MG PO TABS
10.0000 mg | ORAL_TABLET | Freq: Every day | ORAL | Status: DC
  Administered 2013-11-15 – 2013-11-18 (×5): 10 mg via ORAL
  Filled 2013-11-14 (×6): qty 1

## 2013-11-14 MED ORDER — HYDROMORPHONE HCL PF 1 MG/ML IJ SOLN
0.2500 mg | INTRAMUSCULAR | Status: DC | PRN
  Administered 2013-11-14 (×2): 0.5 mg via INTRAVENOUS
  Administered 2013-11-14 (×2): 0.25 mg via INTRAVENOUS

## 2013-11-14 MED ORDER — ONDANSETRON HCL 4 MG PO TABS: 4.0000 mg | ORAL_TABLET | Freq: Four times a day (QID) | ORAL | Status: DC | PRN

## 2013-11-14 MED ORDER — DOCUSATE SODIUM 100 MG PO CAPS
100.0000 mg | ORAL_CAPSULE | Freq: Two times a day (BID) | ORAL | Status: DC
  Administered 2013-11-14 – 2013-11-18 (×6): 100 mg via ORAL
  Filled 2013-11-14 (×9): qty 1

## 2013-11-14 MED ORDER — ACETAMINOPHEN 325 MG PO TABS
650.0000 mg | ORAL_TABLET | Freq: Four times a day (QID) | ORAL | Status: DC | PRN
  Administered 2013-11-17: 650 mg via ORAL
  Filled 2013-11-14: qty 2

## 2013-11-14 MED ORDER — ONDANSETRON HCL 4 MG/2ML IJ SOLN
INTRAMUSCULAR | Status: AC
  Filled 2013-11-14: qty 2

## 2013-11-14 MED ORDER — ACETAMINOPHEN 650 MG RE SUPP: 650.0000 mg | Freq: Four times a day (QID) | RECTAL | Status: DC | PRN

## 2013-11-14 MED ORDER — LIDOCAINE VISCOUS 2 % MT SOLN
20.0000 mL | OROMUCOSAL | Status: DC | PRN
  Filled 2013-11-14: qty 20

## 2013-11-14 MED ORDER — LACTATED RINGERS IV SOLN: INTRAVENOUS | Status: DC

## 2013-11-14 MED ORDER — LEVOTHYROXINE SODIUM 50 MCG PO TABS
50.0000 ug | ORAL_TABLET | Freq: Every evening | ORAL | Status: DC
  Administered 2013-11-14 – 2013-11-18 (×5): 50 ug via ORAL
  Filled 2013-11-14 (×5): qty 1

## 2013-11-14 MED ORDER — PHENOL 1.4 % MT LIQD: 1.0000 | OROMUCOSAL | Status: DC | PRN

## 2013-11-14 MED ORDER — MENTHOL 3 MG MT LOZG: 1.0000 | LOZENGE | OROMUCOSAL | Status: DC | PRN

## 2013-11-14 MED ORDER — OXYCODONE HCL 5 MG PO TABS
5.0000 mg | ORAL_TABLET | ORAL | Status: DC | PRN
  Administered 2013-11-17 – 2013-11-18 (×2): 10 mg via ORAL
  Filled 2013-11-14 (×2): qty 2

## 2013-11-14 MED ORDER — PROMETHAZINE HCL 25 MG/ML IJ SOLN: 6.2500 mg | INTRAMUSCULAR | Status: DC | PRN

## 2013-11-14 MED ORDER — NEOSTIGMINE METHYLSULFATE 10 MG/10ML IV SOLN
INTRAVENOUS | Status: DC | PRN
  Administered 2013-11-14: 3 mg via INTRAVENOUS

## 2013-11-14 MED ORDER — LIDOCAINE HCL (CARDIAC) 20 MG/ML IV SOLN
INTRAVENOUS | Status: DC | PRN
  Administered 2013-11-14: 40 mg via INTRAVENOUS

## 2013-11-14 MED ORDER — MESALAMINE 400 MG PO CPDR
1600.0000 mg | DELAYED_RELEASE_CAPSULE | Freq: Three times a day (TID) | ORAL | Status: DC
  Administered 2013-11-14 – 2013-11-18 (×12): 1600 mg via ORAL
  Filled 2013-11-14 (×15): qty 4

## 2013-11-14 MED ORDER — 0.9 % SODIUM CHLORIDE (POUR BTL) OPTIME
TOPICAL | Status: DC | PRN
  Administered 2013-11-14: 1000 mL

## 2013-11-14 MED ORDER — ARTIFICIAL TEARS OP OINT
TOPICAL_OINTMENT | OPHTHALMIC | Status: AC
  Filled 2013-11-14: qty 3.5

## 2013-11-14 MED ORDER — SIMVASTATIN 20 MG PO TABS
20.0000 mg | ORAL_TABLET | Freq: Every day | ORAL | Status: DC
  Filled 2013-11-14: qty 1

## 2013-11-14 MED ORDER — MORPHINE SULFATE 2 MG/ML IJ SOLN
1.0000 mg | INTRAMUSCULAR | Status: DC | PRN
  Administered 2013-11-16: 1 mg via INTRAVENOUS
  Filled 2013-11-14: qty 1

## 2013-11-14 MED ORDER — ALUMINUM HYDROXIDE GEL 320 MG/5ML PO SUSP
15.0000 mL | ORAL | Status: DC | PRN
  Filled 2013-11-14: qty 30

## 2013-11-14 MED ORDER — LACTATED RINGERS IV SOLN
INTRAVENOUS | Status: DC | PRN
  Administered 2013-11-14 (×2): via INTRAVENOUS

## 2013-11-14 MED ORDER — PHENYLEPHRINE 40 MCG/ML (10ML) SYRINGE FOR IV PUSH (FOR BLOOD PRESSURE SUPPORT)
PREFILLED_SYRINGE | INTRAVENOUS | Status: AC
  Filled 2013-11-14: qty 10

## 2013-11-14 MED ORDER — OXYCODONE-ACETAMINOPHEN 5-325 MG PO TABS
1.0000 | ORAL_TABLET | ORAL | Status: DC | PRN
  Administered 2013-11-14 – 2013-11-15 (×3): 2 via ORAL
  Administered 2013-11-16 – 2013-11-17 (×4): 1 via ORAL
  Administered 2013-11-18 (×3): 2 via ORAL
  Filled 2013-11-14: qty 2
  Filled 2013-11-14: qty 1
  Filled 2013-11-14: qty 2
  Filled 2013-11-14: qty 1
  Filled 2013-11-14 (×5): qty 2
  Filled 2013-11-14: qty 1

## 2013-11-14 MED ORDER — HYDROCODONE-ACETAMINOPHEN 5-325 MG PO TABS: 1.0000 | ORAL_TABLET | ORAL | Status: DC | PRN

## 2013-11-14 MED ORDER — FENTANYL CITRATE 0.05 MG/ML IJ SOLN
INTRAMUSCULAR | Status: AC
  Filled 2013-11-14: qty 5

## 2013-11-14 MED ORDER — PROPOFOL 10 MG/ML IV BOLUS
INTRAVENOUS | Status: DC | PRN
  Administered 2013-11-14: 20 mg via INTRAVENOUS
  Administered 2013-11-14: 80 mg via INTRAVENOUS

## 2013-11-14 MED ORDER — PHENYLEPHRINE HCL 10 MG/ML IJ SOLN
INTRAMUSCULAR | Status: DC | PRN
  Administered 2013-11-14 (×5): 80 ug via INTRAVENOUS

## 2013-11-14 SURGICAL SUPPLY — 58 items
BANDAGE ELASTIC 6 VELCRO ST LF (GAUZE/BANDAGES/DRESSINGS) IMPLANT
BANDAGE ESMARK 6X9 LF (GAUZE/BANDAGES/DRESSINGS) ×1 IMPLANT
BLADE 10 SAFETY STRL DISP (BLADE) ×3 IMPLANT
BLADE SURG 10 STRL SS (BLADE) ×3 IMPLANT
BLADE SURG ROTATE 9660 (MISCELLANEOUS) IMPLANT
BNDG COHESIVE 3X5 TAN STRL LF (GAUZE/BANDAGES/DRESSINGS) ×3 IMPLANT
BNDG ESMARK 6X9 LF (GAUZE/BANDAGES/DRESSINGS) ×3
CHLORAPREP W/TINT 26ML (MISCELLANEOUS) ×3 IMPLANT
COVER MAYO STAND STRL (DRAPES) ×3 IMPLANT
COVER SURGICAL LIGHT HANDLE (MISCELLANEOUS) ×3 IMPLANT
CUFF TOURNIQUET SINGLE 34IN LL (TOURNIQUET CUFF) IMPLANT
CUFF TOURNIQUET SINGLE 44IN (TOURNIQUET CUFF) IMPLANT
DRAPE OEC MINIVIEW 54X84 (DRAPES) IMPLANT
DRAPE U-SHAPE 47X51 STRL (DRAPES) ×3 IMPLANT
DRSG ADAPTIC 3X8 NADH LF (GAUZE/BANDAGES/DRESSINGS) ×3 IMPLANT
DRSG PAD ABDOMINAL 8X10 ST (GAUZE/BANDAGES/DRESSINGS) ×3 IMPLANT
ELECT REM PT RETURN 9FT ADLT (ELECTROSURGICAL) ×3
ELECTRODE REM PT RTRN 9FT ADLT (ELECTROSURGICAL) ×1 IMPLANT
EVACUATOR 1/8 PVC DRAIN (DRAIN) ×6 IMPLANT
GAUZE XEROFORM 1X8 LF (GAUZE/BANDAGES/DRESSINGS) IMPLANT
GLOVE BIO SURGEON STRL SZ7 (GLOVE) ×3 IMPLANT
GLOVE BIO SURGEON STRL SZ7.5 (GLOVE) ×3 IMPLANT
GLOVE BIOGEL PI IND STRL 8 (GLOVE) ×1 IMPLANT
GLOVE BIOGEL PI INDICATOR 8 (GLOVE) ×2
GOWN STRL REUS W/ TWL LRG LVL3 (GOWN DISPOSABLE) ×3 IMPLANT
GOWN STRL REUS W/ TWL XL LVL3 (GOWN DISPOSABLE) ×1 IMPLANT
GOWN STRL REUS W/TWL LRG LVL3 (GOWN DISPOSABLE) ×6
GOWN STRL REUS W/TWL XL LVL3 (GOWN DISPOSABLE) ×2
HANDPIECE INTERPULSE COAX TIP (DISPOSABLE) ×2
KIT BASIN OR (CUSTOM PROCEDURE TRAY) ×3 IMPLANT
KIT ROOM TURNOVER OR (KITS) ×3 IMPLANT
MANIFOLD NEPTUNE II (INSTRUMENTS) ×3 IMPLANT
NS IRRIG 1000ML POUR BTL (IV SOLUTION) ×3 IMPLANT
PACK ORTHO EXTREMITY (CUSTOM PROCEDURE TRAY) ×3 IMPLANT
PAD ARMBOARD 7.5X6 YLW CONV (MISCELLANEOUS) ×6 IMPLANT
PAD CAST 4YDX4 CTTN HI CHSV (CAST SUPPLIES) IMPLANT
PADDING CAST COTTON 4X4 STRL (CAST SUPPLIES)
SET HNDPC FAN SPRY TIP SCT (DISPOSABLE) ×1 IMPLANT
SLING   SWATHE MEDIUM (SOFTGOODS) ×2
SLING  SWATHE MEDIUM (SOFTGOODS) ×1 IMPLANT
SPONGE GAUZE 4X4 12PLY (GAUZE/BANDAGES/DRESSINGS) IMPLANT
SPONGE GAUZE 4X4 12PLY STER LF (GAUZE/BANDAGES/DRESSINGS) ×3 IMPLANT
SPONGE LAP 4X18 X RAY DECT (DISPOSABLE) ×6 IMPLANT
STAPLER VISISTAT 35W (STAPLE) IMPLANT
STOCKINETTE IMPERVIOUS 9X36 MD (GAUZE/BANDAGES/DRESSINGS) ×3 IMPLANT
SUCTION FRAZIER TIP 10 FR DISP (SUCTIONS) ×3 IMPLANT
SUT ETHILON 2 0 FS 18 (SUTURE) ×6 IMPLANT
SUT ETHILON 4 0 PS 2 18 (SUTURE) IMPLANT
SUT VIC AB 0 CTB1 27 (SUTURE) IMPLANT
SUT VIC AB 2-0 FS1 27 (SUTURE) ×3 IMPLANT
SYR CONTROL 10ML LL (SYRINGE) IMPLANT
TAPE CLOTH SURG 6X10 WHT LF (GAUZE/BANDAGES/DRESSINGS) ×3 IMPLANT
TOWEL OR 17X24 6PK STRL BLUE (TOWEL DISPOSABLE) ×3 IMPLANT
TOWEL OR 17X26 10 PK STRL BLUE (TOWEL DISPOSABLE) ×3 IMPLANT
TUBE CONNECTING 12'X1/4 (SUCTIONS) ×1
TUBE CONNECTING 12X1/4 (SUCTIONS) ×2 IMPLANT
WATER STERILE IRR 1000ML POUR (IV SOLUTION) ×3 IMPLANT
YANKAUER SUCT BULB TIP NO VENT (SUCTIONS) ×3 IMPLANT

## 2013-11-14 NOTE — H&P (Signed)
[name] is an 73 y.o. female.   Chief Complaint: right shoulder pain HPI: 73 yo female over 1 year s/p right shoulder ORIF proximal humerus fracture. Had history of infection right shoulder improved with IV vancomycin x 4 weeks in January. Presented to clinic yesterday with 4 day history of worsening right shoulder pain, swelling, redness, warmth. 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   Past Medical History  Diagnosis Date  . Peripheral vascular disease   . Subdural hematoma   . Ataxia   . Mild dysplasia of cervix   . UTI (lower urinary tract infection)   . Urinary incontinence   . Cerebrovascular disease   . Personal history of colonic polyps     adenomatous 1997 & tubular adenoma and hyplastic  2008  . Hyperlipidemia     takes Pravastatin daily  . H/O alcohol abuse   . Diverticulosis of colon (without mention of hemorrhage)   . Redundant colon   . C. difficile colitis   . Dementia t  . Ulcerative colitis     takes [place] daily  . Dysrhythmia     a fib-takes Diltiazem daily  . Seizures     takes Phenobarbital and Keppra daily;last seizure was April 1,2014  . Stroke 2000    off balance on right side a little and peripheral vision loss on right side  . Arthritis   . Joint pain   . Joint swelling   . History of UTI 10-2012  . Hypothyroidism     takes Levothyroxine daily  . Squamous cell carcinoma of mouth   . Adenocarcinoma, breast     bilateral  . Anxiety   . Dementia   . Cancer of larynx   . Hypertension   . Atrial fibrillation   . History of breast cancer   . History of seizures   . Gait disorder   . Oropharyngeal cancer   . History of colitis     Past Surgical History  Procedure Laterality Date  . Subdural hematoma evacuation via craniotomy    . Oral surgery for squamous cell carcinoma of the mouth      x 3  . Multiple tooth extractions      due to oral  cancer  . Cataract extraction, bilateral    . Breast lumpectomy      left breast with radiation therapy  . Carotid endarterectomy      left  . Mastectomy      Right, history with nodule dissection  . Orif hip fracture  Sept '12    Right hip: screw and plate repair.  . Larynx surgery  08/2010    [facility]  . Tubal ligation    . Colonoscopy    . Orif humerus fracture Right 10/30/2012    Procedure: OPEN REDUCTION INTERNAL FIXATION (ORIF) HUMERAL SHAFT FRACTURE;  Surgeon: Hessie Dibble, MD;  Location: [facility];  Service: Orthopedics;  Laterality: Right;  BIG C-ARM, SHOULDER POSITION  . Hammer toe surgery Right 03/2013    pins and screws  . Flexible sigmoidoscopy N/A 06/21/2013    Procedure: FLEXIBLE SIGMOIDOSCOPY;  Surgeon: Jerene Bears, MD;  Location: [facility];  Service: Gastroenterology;  Laterality: N/A;    Family History  Problem Relation Age of Onset  . Coronary artery disease Mother   . Heart disease Mother   . Diabetes Sister   . Breast cancer Maternal Aunt   . Breast  cancer Sister    Social History:  reports that she quit smoking about 9 years ago. She has never used smokeless tobacco. She reports that she drinks about 4.2 ounces of alcohol per week. She reports that she does not use illicit drugs.  Allergies: No Known Allergies  Medications Prior to Admission  Medication Sig Dispense Refill  . aspirin 81 MG tablet Take 81 mg by mouth daily.      . Calcium Carbonate-Vitamin D (CALCIUM 500 + D PO) Take 1 tablet by mouth daily.      . cephALEXin (KEFLEX) 500 MG capsule Take 500 mg by mouth 4 (four) times daily. Started 11/11/13, for 10 days.      Marland Kitchen diltiazem (DILACOR XR) 180 MG 24 hr capsule Take 180 mg by mouth every morning.      . ferrous sulfate 325 (65 FE) MG EC tablet Take 325 mg by mouth 2 (two) times daily.      Marland Kitchen levETIRAcetam (KEPPRA) 750 MG tablet Take 2 tablets (1,500 mg total) by mouth 2 (two) times daily.  120 tablet  0  . levothyroxine (SYNTHROID,  LEVOTHROID) 50 MCG tablet Take 50 mcg by mouth every evening.      . lidocaine (XYLOCAINE) 2 % solution Take 20 mLs by mouth every 3 (three) hours as needed for mouth pain. Oral pain      . Mesalamine (ASACOL HD) 800 MG TBEC Take 2 tablets (1,600 mg total) by mouth 3 (three) times daily.  144 tablet  0  . Multiple Vitamin (MULTIVITAMIN WITH MINERALS) TABS Take 1 tablet by mouth daily.      Marland Kitchen PHENobarbital (LUMINAL) 97.2 MG tablet Take 97.2 mg by mouth every morning.      . pravastatin (PRAVACHOL) 40 MG tablet Take 40 mg by mouth every evening.      . vitamin C (ASCORBIC ACID) 500 MG tablet Take 500 mg by mouth daily.        No results found for this or any previous visit (from the past 48 hour(s)). No results found.  Review of Systems  Musculoskeletal:       Right shoulder pain, swelling  All other systems reviewed and are negative.   Blood pressure 113/61, pulse 85, temperature 98.8 F (37.1 C), temperature source Oral, resp. rate 16, height 5\' 5"  (1.651 m), weight 56.246 kg (124 lb), SpO2 95.00%. Physical Exam  Constitutional: She is oriented to person, place, and time. She appears well-developed and well-nourished.  HENT:  Head: Normocephalic and atraumatic.  Eyes: EOM are normal.  Neck: Normal range of motion.  Musculoskeletal:  Right shoulder with swelling, erythema, warmth Healed incision with no drainage ROM with significant pain Distally NVI  Neurological: She is alert and oriented to person, place, and time.  Skin: Skin is warm and dry.  Psychiatric: She has a normal mood and affect. Her behavior is normal. Judgment and thought content normal.     Assessment/Plan Over 1 year s/p orif right proximal humerus fracture with concern for recurrent infection Plan to admit inpatient to begin IV vancomycin I&D today with hardware removal, will obtain cultures at time of surgery Will get ID to tailor abx regimen     Grier Mitts 11/14/2013, 7:30 AM

## 2013-11-14 NOTE — H&P (Signed)
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  Will remove all hardware and I&D formally, then will need another course of IV abx to eradicate infection.

## 2013-11-14 NOTE — Anesthesia Preprocedure Evaluation (Addendum)
Anesthesia Evaluation  Patient identified by MRN, date of birth, ID band Patient awake    Reviewed: Allergy & Precautions, H&P , NPO status , Patient's Chart, lab work & pertinent test results  History of Anesthesia Complications Negative for: history of anesthetic complications  Airway Mallampati: II TM Distance: >3 FB Neck ROM: Full    Dental  (+) Teeth Intact, Caps, Dental Advisory Given   Pulmonary former smoker (quit '06),  H/o laryngeal cancer: trach stoma breath sounds clear to auscultation  Pulmonary exam normal       Cardiovascular hypertension, Pt. on medications + Peripheral Vascular Disease (h/o CEA) Rhythm:Regular Rate:Normal  '13 ECHO: EF 55-60%, valves OK, possible PFO   Neuro/Psych Seizures -, Well Controlled,  H/o crani for SDH CVA (balance issues), Residual Symptoms    GI/Hepatic negative GI ROS, (+)     substance abuse  alcohol use,   Endo/Other  negative endocrine ROSHypothyroidism   Renal/GU negative Renal ROS     Musculoskeletal   Abdominal   Peds  Hematology negative hematology ROS (+)   Anesthesia Other Findings   Reproductive/Obstetrics                        Anesthesia Physical Anesthesia Plan  ASA: III  Anesthesia Plan: General   Post-op Pain Management:    Induction: Intravenous  Airway Management Planned: Tracheostomy  Additional Equipment:   Intra-op Plan:   Post-operative Plan: Extubation in OR  Informed Consent: I have reviewed the patients History and Physical, chart, labs and discussed the procedure including the risks, benefits and alternatives for the proposed anesthesia with the patient or authorized representative who has indicated his/her understanding and acceptance.   Dental advisory given  Plan Discussed with: Surgeon and CRNA  Anesthesia Plan Comments: (Plan routine monitors, GETA with ETT in stoma)        Anesthesia Quick  Evaluation

## 2013-11-14 NOTE — Anesthesia Postprocedure Evaluation (Signed)
  Anesthesia Post-op Note  Patient: [name]  Procedure(s) Performed: Procedure(s): RIGHT SHOUDER HARDWARE REMOVAL (Right) INCISION AND DRAINAGE (Right)  Patient Location: PACU  Anesthesia Type:GA combined with regional for post-op pain  Level of Consciousness: awake, alert , oriented and patient cooperative  Airway and Oxygen Therapy: Patient Spontanous Breathing  Post-op Pain: 4 /10, mild  Post-op Assessment: Post-op Vital signs reviewed, Patient's Cardiovascular Status Stable, Respiratory Function Stable, Patent Airway, No signs of Nausea or vomiting and Pain level controlled  Post-op Vital Signs: Reviewed and stable  Last Vitals:  Filed Vitals:   11/14/13 1241  BP: 94/51  Pulse: 75  Temp: 36.7 C  Resp: 18    Complications: No apparent anesthesia complications

## 2013-11-14 NOTE — Progress Notes (Signed)
Utilization review completed.  

## 2013-11-14 NOTE — Progress Notes (Signed)
Pt arrived to unit around 1800. C/o no pain at this time. Respiratory called to assess capped trach stoma. Suction equipment at bedside. Pt instructed on use and able to suction self at this time. Pt instructed on plan of care for the night until surgery and until the AM when further orders are placed. Pt comfortable in bed. Call bell left within reach, pt understands use. Will continue to monitor.

## 2013-11-14 NOTE — Progress Notes (Signed)
Pt transferred to the floor, without any concerns. Pain is controlled down to 4 with analgesics. On a trach coller, breathing clear. Respiratory called to assess the trach stoma. Husband called to PACU for easy communication with the patient. No concerns. [name]

## 2013-11-14 NOTE — Anesthesia Procedure Notes (Signed)
Procedure Name: Intubation Date/Time: 11/14/2013 9:28 AM Performed by: Scheryl Darter Pre-anesthesia Checklist: Patient identified, Emergency Drugs available, Suction available, Patient being monitored and Timeout performed Patient Re-evaluated:Patient Re-evaluated prior to inductionOxygen Delivery Method: Circle system utilized Preoxygenation: Pre-oxygenation with 100% oxygen Intubation Type: IV induction Ventilation: Mask ventilation without difficulty Tube type: Reinforced Tube size: 7.0 mm Number of attempts: 1 Placement Confirmation: positive ETCO2 and breath sounds checked- equal and bilateral Tube secured with: Tape Comments: Spiral flex tube placed in tracheostomy stoma and secured, + ETC02, BBS equal, vss

## 2013-11-14 NOTE — Transfer of Care (Signed)
Immediate Anesthesia Transfer of Care Note  Patient: [name]  Procedure(s) Performed: Procedure(s): RIGHT SHOUDER HARDWARE REMOVAL (Right) INCISION AND DRAINAGE (Right)  Patient Location: PACU  Anesthesia Type:General  Level of Consciousness: awake, alert , oriented and sedated  Airway & Oxygen Therapy: Patient Spontanous Breathing and Patient connected to tracheostomy mask oxygen  Post-op Assessment: Report given to PACU RN, Post -op Vital signs reviewed and stable and Patient moving all extremities  Post vital signs: Reviewed and stable  Complications: No apparent anesthesia complications

## 2013-11-14 NOTE — Progress Notes (Signed)
Came to discuss PICC placement with patient. Patient requests to speak to her husband first. Primary RN notified.

## 2013-11-14 NOTE — Progress Notes (Signed)
ANTIBIOTIC CONSULT NOTE - INITIAL  Pharmacy Consult for vancomycin Indication: wound infection  No Known Allergies  Patient Measurements: Height: 5\' 5"  (165.1 cm) Weight: 124 lb (56.246 kg) IBW/kg (Calculated) : 57  Vital Signs: Temp: 98.8 F (37.1 C) (05/14 0616) Temp src: Oral (05/14 0616) BP: 113/61 mmHg (05/14 0616) Pulse Rate: 85 (05/14 0616)  Labs:  Recent Labs  11/11/13 1038  WBC 18.6 cH*  HGB 13.3  PLT 175.0  CREATININE 0.7   Estimated Creatinine Clearance: 53.9 ml/min (by C-G formula based on Cr of 0.7).   Microbiology: No results found for this or any previous visit (from the past 720 hour(s)).  Medical History: Past Medical History  Diagnosis Date  . Peripheral vascular disease   . Subdural hematoma   . Ataxia   . Mild dysplasia of cervix   . UTI (lower urinary tract infection)   . Urinary incontinence   . Cerebrovascular disease   . Personal history of colonic polyps     adenomatous 1997 & tubular adenoma and hyplastic  2008  . Hyperlipidemia     takes Pravastatin daily  . H/O alcohol abuse   . Diverticulosis of colon (without mention of hemorrhage)   . Redundant colon   . C. difficile colitis   . Dementia t  . Ulcerative colitis     takes [place] daily  . Dysrhythmia     a fib-takes Diltiazem daily  . Seizures     takes Phenobarbital and Keppra daily;last seizure was April 1,2014  . Stroke 2000    off balance on right side a little and peripheral vision loss on right side  . Arthritis   . Joint pain   . Joint swelling   . History of UTI 10-2012  . Hypothyroidism     takes Levothyroxine daily  . Squamous cell carcinoma of mouth   . Adenocarcinoma, breast     bilateral  . Anxiety   . Dementia   . Cancer of larynx   . Hypertension   . Atrial fibrillation   . History of breast cancer   . History of seizures   . Gait disorder   . Oropharyngeal cancer   . History of colitis     Medications:  Prescriptions prior to admission   Medication Sig Dispense Refill  . aspirin 81 MG tablet Take 81 mg by mouth daily.      . Calcium Carbonate-Vitamin D (CALCIUM 500 + D PO) Take 1 tablet by mouth daily.      . cephALEXin (KEFLEX) 500 MG capsule Take 500 mg by mouth 4 (four) times daily. Started 11/11/13, for 10 days.      Marland Kitchen diltiazem (DILACOR XR) 180 MG 24 hr capsule Take 180 mg by mouth every morning.      . ferrous sulfate 325 (65 FE) MG EC tablet Take 325 mg by mouth 2 (two) times daily.      Marland Kitchen levETIRAcetam (KEPPRA) 750 MG tablet Take 2 tablets (1,500 mg total) by mouth 2 (two) times daily.  120 tablet  0  . levothyroxine (SYNTHROID, LEVOTHROID) 50 MCG tablet Take 50 mcg by mouth every evening.      . lidocaine (XYLOCAINE) 2 % solution Take 20 mLs by mouth every 3 (three) hours as needed for mouth pain. Oral pain      . Mesalamine (ASACOL HD) 800 MG TBEC Take 2 tablets (1,600 mg total) by mouth 3 (three) times daily.  144 tablet  0  . Multiple Vitamin (MULTIVITAMIN  WITH MINERALS) TABS Take 1 tablet by mouth daily.      Marland Kitchen PHENobarbital (LUMINAL) 97.2 MG tablet Take 97.2 mg by mouth every morning.      . pravastatin (PRAVACHOL) 40 MG tablet Take 40 mg by mouth every evening.      . vitamin C (ASCORBIC ACID) 500 MG tablet Take 500 mg by mouth daily.       Scheduled:  . chlorhexidine  60 mL Topical Once  . diltiazem  180 mg Oral q morning - 10a  . docusate sodium  100 mg Oral BID  . levETIRAcetam  1,500 mg Oral BID  . levETIRAcetam  1,500 mg Oral BID  . levothyroxine  50 mcg Oral QPM  . Mesalamine  1,600 mg Oral TID  . PHENobarbital  97.2 mg Oral q morning - 10a  . simvastatin  20 mg Oral q1800   Infusions:  . lactated ringers 75 mL/hr at 11/13/13 2345    Assessment: 73yo female sustained mechanical fall Friday and over the weekend noted pain and increased erythema/swelling of RUE, had been put on Keflex for presumed infection since pt had been on IV ABX (Bactrim-->vanc) x4wk ending in [place] for enterococcal skin  infection associated w/ right shoulder hardware, concern for recurrent infection, admitted for I&D and hardware removal, to begin IV ABX.  Goal of Therapy:  Vancomycin trough level 10-15 mcg/ml  Plan:  Will begin vancomycin 1000mg  IV Q24H and monitor CBC, Cx, levels prn.  Wynona Neat, PharmD, BCPS  11/14/2013,7:34 AM

## 2013-11-14 NOTE — Op Note (Signed)
Procedure(s): RIGHT SHOUDER HARDWARE REMOVAL INCISION AND DRAINAGE Procedure Note  [name] female 73 y.o. 11/14/2013  Procedure(s) and Anesthesia Type:    * RIGHT SHOUDER HARDWARE REMOVAL - Choice       Irrigation and debridement deep infection right shoulder      Surgeon: Nita Sells   Assistants: Jeanmarie Hubert PA-C (Danielle was present and scrubbed throughout the procedure and was essential in positioning, retraction, exposure, and closure)  Anesthesia: General endotracheal anesthesia    Procedure Detail  RIGHT SHOUDER HARDWARE REMOVAL, INCISION AND DRAINAGE  Estimated Blood Loss:  200 mL         Drains: none  Blood Given: none         Specimens: none        Complications:  * No complications entered in OR log *         Disposition: PACU - hemodynamically stable.         Condition: stable    Procedure:   INDICATIONS FOR SURGERY: The patient has a history of ORIF right proximal humerus fracture approximately one year ago. She had some issues a postoperative infection which were initially treated with antibiotics. She had recent recurrence and infection with significant pain erythema and swelling in the arm. She had an elevated white count consistent with infection. Indicated for operative treatment to debride the infection and removed the deep hardware. She also was noted to have collapse of the humeral head on x-ray.  OPERATIVE FINDINGS: Gross purulence involving the joint space extending superficially over the plate to subcutaneous tissues distally. She was noted to have complete collapse of the humeral head.  DESCRIPTION OF PROCEDURE: The patient was identified in preoperative  holding area where I personally marked the operative site after  verifying site, side, and procedure with the patient. The patient was taken back  to the operating room where general anesthesia was induced without  Complication. She was placed in the beachchair  position. The right upper extremity was prepped and draped in standard sterile fashion. The previously made incision was opened sharply and extended distally about 2 cm. Just medial to the incision site was noted to be a pocket that was opened and a resultant amounts of significant purulence was noted and cultured. She was then given vancomycin intraoperatively after cultures were taken. The tract of purulence was developed communicated directly with the plate. The deltopectoral interval was opened and the entire plate and proximal humerus were exposed.  The plate, all pegs, and all screws were removed without difficulty. The site of the plate was then aggressively debrided with a Cobb elevator, curettes and rongeurs. The small curettes were used in each of the cortical bone holes and proximally into the head to debride this bone extensively. The joint was opened and the rotator interval and purulence was noted to be in the joint. Therefore the subscapularis was taken down to expose the joint. Humeral head was noted to be collapsed. Aggressive debridement was carried out above the bursal tissues and rotator cuff surrounding the joint which appeared unhealthy and infected. Pulse irrigation was then used to irrigate about 2 L normal saline through the joint and then another liter throughout the subcutaneous tissues after aggressive debridement. Deep drains were then placed out laterally beneath the deltoid with one placed in the glenohumeral joint and one in the subdeltoid space. The wound was then closed with sparse 2-0 antibiotic coated Vicryl in a deep dermal layer and then 2-0 nylon in an  interrupted horizontal mattress sutures superficially. Sterile dressing was then applied and the patient was allowed to awaken from anesthesia transferred to stretcher and taken to the recovery room in stable condition.  Postoperative plan: She will be admitted to the hospital. She will be restarted on vancomycin. We will  await intraoperative cultures to treat presumably for enterococcus as this was her last infection. Infectious disease will be consult. She will likely be in the hospital several days getting IV antibiotics and transitioning to home antibiotics when ready.

## 2013-11-14 NOTE — Consult Note (Addendum)
[facility] for Infectious Disease  Date of Admission:  11/13/2013  Date of Consult:  11/14/2013  Reason for Consult: Osteomyelitis Referring Physician: Tamera Punt  Impression/Recommendation Osteomyelitis of L arm C diff 05-19-11 Multiple Previous Cancers  Would- Await Cx Place PIC Continue vanco for now Watch her bowel movements, with hx of C diff Get records of her previous Cx ([phone])  Comment Hopefully she can be "cured" with hardware now out. Would anticipate that she will need 6 weeks of therapy. Hopefully Dr Tamera Punt can get her culture records, I was not able to get them from the office.   Thank you so much for this interesting consult,   Campbell Riches (pager) [phone] [email or web address]  [name] is an 74 y.o. female.  HPI: 73 yo F with hx of laryngeal CA x2, oral Ca x 3, bilateral Breast CA, C diff, Ulcerative Colitis, and R shoulder ORIF (10-29-12).  After her surgery (July-August) she developed a "hematoma" at the site. She was treated with po nabx at her SNF.  By January, 2015, she developed 2 swollen areas (sice of a quarter and a fifty cent piece). These burst prior to her getting to her MD's office. She was treated for enterococcal infection, first with bactrim po then vancomycin for 4 weeks in January-February 2015.  She returned to f/u with her orthopedist 5-10 and had worsening pain, swelling and warmth of her shoulder. She was admitted 5-14 and underwent removal of her hardware as well as drainage of gross purulence.  She was started on vancomycin.  Her WBC 8.1, CRP 7.9.    Past Medical History  Diagnosis Date  . Peripheral vascular disease   . Subdural hematoma   . Ataxia   . Mild dysplasia of cervix   . UTI (lower urinary tract infection)   . Urinary incontinence   . Cerebrovascular disease   . Personal history of colonic polyps     adenomatous 1997 & tubular adenoma and hyplastic  2008  . Hyperlipidemia     takes  Pravastatin daily  . H/O alcohol abuse   . Diverticulosis of colon (without mention of hemorrhage)   . Redundant colon   . C. difficile colitis   . Dementia t  . Ulcerative colitis     takes [place] daily  . Dysrhythmia     a fib-takes Diltiazem daily  . Seizures     takes Phenobarbital and Keppra daily;last seizure was April 1,2014  . Stroke 2000    off balance on right side a little and peripheral vision loss on right side  . Arthritis   . Joint pain   . Joint swelling   . History of UTI 10-2012  . Hypothyroidism     takes Levothyroxine daily  . Squamous cell carcinoma of mouth   . Adenocarcinoma, breast     bilateral  . Anxiety   . Dementia   . Cancer of larynx   . Hypertension   . Atrial fibrillation   . History of breast cancer   . History of seizures   . Gait disorder   . Oropharyngeal cancer   . History of colitis     Past Surgical History  Procedure Laterality Date  . Subdural hematoma evacuation via craniotomy    . Oral surgery for squamous cell carcinoma of the mouth      x 3  . Multiple tooth extractions      due to oral cancer  . Cataract extraction, bilateral    .  Breast lumpectomy      left breast with radiation therapy  . Carotid endarterectomy      left  . Mastectomy      Right, history with nodule dissection  . Orif hip fracture  Sept '12    Right hip: screw and plate repair.  . Larynx surgery  08/2010    [facility] x 2  . Tubal ligation    . Colonoscopy    . Orif humerus fracture Right 10/30/2012    Procedure: OPEN REDUCTION INTERNAL FIXATION (ORIF) HUMERAL SHAFT FRACTURE;  Surgeon: Hessie Dibble, MD;  Location: [facility];  Service: Orthopedics;  Laterality: Right;  BIG C-ARM, SHOULDER POSITION  . Hammer toe surgery Right 03/2013    pins and screws  . Flexible sigmoidoscopy N/A 06/21/2013    Procedure: FLEXIBLE SIGMOIDOSCOPY;  Surgeon: Jerene Bears, MD;  Location: [facility];  Service: Gastroenterology;  Laterality: N/A;     No Known  Allergies  Medications:  Scheduled: . aspirin EC  81 mg Oral Daily  . atorvastatin  10 mg Oral q1800  . diltiazem  180 mg Oral q morning - 10a  . docusate sodium  100 mg Oral BID  . levETIRAcetam  1,500 mg Oral BID  . levothyroxine  50 mcg Oral QPM  . Mesalamine  1,600 mg Oral TID  . PHENobarbital  97.2 mg Oral q morning - 10a  . vancomycin  1,000 mg Intravenous Q24H    Abtx:  Anti-infectives   Start     Dose/Rate Route Frequency Ordered Stop   11/14/13 0800  vancomycin (VANCOCIN) IVPB 1000 mg/200 mL premix     1,000 mg 200 mL/hr over 60 Minutes Intravenous Every 24 hours 11/14/13 0749        Total days of antibiotics: 1 (vanco)          Social History:  reports that she quit smoking about 9 years ago. She has never used smokeless tobacco. She reports that she drinks about 4.2 ounces of alcohol per week. She reports that she does not use illicit drugs.  Family History  Problem Relation Age of Onset  . Coronary artery disease Mother   . Heart disease Mother   . Diabetes Sister   . Breast cancer Maternal Aunt   . Breast cancer Sister     General ROS: previously eating well, no G-tube. Normal BM, normal urination. No fever or chills. see HPI.   Blood pressure 94/51, pulse 75, temperature 98 F (36.7 C), temperature source Oral, resp. rate 18, height 5\' 5"  (1.651 m), weight 56.246 kg (124 lb), SpO2 93.00%. General appearance: alert, cooperative, no distress and pale Eyes: negative findings: pupils equal, round, reactive to light and accomodation Throat: normal findings: oropharynx pink & moist without lesions or evidence of thrush Neck: midline tracheostomy Lungs: clear to auscultation bilaterally Heart: regular rate and rhythm Abdomen: normal findings: bowel sounds normal and soft, non-tender Extremities: R shoulder wound is clean, dressings in place.    Results for orders placed during the hospital encounter of 11/13/13 (from the past 48 hour(s))  SURGICAL PCR  SCREEN     Status: None   Collection Time    11/14/13  6:07 AM      Result Value Ref Range   MRSA, PCR NEGATIVE  NEGATIVE   Staphylococcus aureus NEGATIVE  NEGATIVE   Comment:            The Xpert SA Assay (FDA     approved for NASAL specimens  in patients over 76 years of age),     is one component of     a comprehensive surveillance     program.  Test performance has     been validated by [employer] for patients greater     than or equal to 64 year old.     It is not intended     to diagnose infection nor to     guide or monitor treatment.  SEDIMENTATION RATE     Status: Abnormal   Collection Time    11/14/13  7:50 AM      Result Value Ref Range   Sed Rate 86 (*) 0 - 22 mm/hr  C-REACTIVE PROTEIN     Status: Abnormal   Collection Time    11/14/13  7:50 AM      Result Value Ref Range   CRP 7.9 (*) <0.60 mg/dL   Comment: Performed at [facility]  CBC WITH DIFFERENTIAL     Status: Abnormal   Collection Time    11/14/13  7:50 AM      Result Value Ref Range   WBC 8.1  4.0 - 10.5 K/uL   RBC 3.32 (*) 3.87 - 5.11 MIL/uL   Hemoglobin 10.8 (*) 12.0 - 15.0 g/dL   HCT 31.0 (*) 36.0 - 46.0 %   MCV 93.4  78.0 - 100.0 fL   MCH 32.5  26.0 - 34.0 pg   MCHC 34.8  30.0 - 36.0 g/dL   RDW 13.7  11.5 - 15.5 %   Platelets 160  150 - 400 K/uL   Neutrophils Relative % 72  43 - 77 %   Neutro Abs 5.9  1.7 - 7.7 K/uL   Lymphocytes Relative 14  12 - 46 %   Lymphs Abs 1.2  0.7 - 4.0 K/uL   Monocytes Relative 12  3 - 12 %   Monocytes Absolute 0.9  0.1 - 1.0 K/uL   Eosinophils Relative 2  0 - 5 %   Eosinophils Absolute 0.2  0.0 - 0.7 K/uL   Basophils Relative 0  0 - 1 %   Basophils Absolute 0.0  0.0 - 0.1 K/uL      Component Value Date/Time   SDES URINE, CLEAN CATCH 10/02/2012 1649   SPECREQUEST NONE 10/02/2012 1649   CULT ESCHERICHIA COLI 10/02/2012 1649   REPTSTATUS 10/04/2012 FINAL 10/02/2012 1649   No results found. Recent Results (from the past 240 hour(s))  SURGICAL PCR  SCREEN     Status: None   Collection Time    11/14/13  6:07 AM      Result Value Ref Range Status   MRSA, PCR NEGATIVE  NEGATIVE Final   Staphylococcus aureus NEGATIVE  NEGATIVE Final   Comment:            The Xpert SA Assay (FDA     approved for NASAL specimens     in patients over 54 years of age),     is one component of     a comprehensive surveillance     program.  Test performance has     been validated by [employer] for patients greater     than or equal to 77 year old.     It is not intended     to diagnose infection nor to     guide or monitor treatment.      11/14/2013, 3:19 PM  LOS: 1 day     **Disclaimer: This note may have been dictated with voice recognition software. Similar sounding words can inadvertently be transcribed and this note may contain transcription errors which may not have been corrected upon publication of note.**

## 2013-11-15 ENCOUNTER — Encounter (HOSPITAL_COMMUNITY): Payer: Self-pay | Admitting: Orthopedic Surgery

## 2013-11-15 DIAGNOSIS — Z859 Personal history of malignant neoplasm, unspecified: Secondary | ICD-10-CM

## 2013-11-15 DIAGNOSIS — T847XXA Infection and inflammatory reaction due to other internal orthopedic prosthetic devices, implants and grafts, initial encounter: Secondary | ICD-10-CM

## 2013-11-15 DIAGNOSIS — Y849 Medical procedure, unspecified as the cause of abnormal reaction of the patient, or of later complication, without mention of misadventure at the time of the procedure: Secondary | ICD-10-CM

## 2013-11-15 LAB — CBC
HCT: 25.5 % — ABNORMAL LOW (ref 36.0–46.0)
Hemoglobin: 8.7 g/dL — ABNORMAL LOW (ref 12.0–15.0)
MCH: 32.6 pg (ref 26.0–34.0)
MCHC: 34.1 g/dL (ref 30.0–36.0)
MCV: 95.5 fL (ref 78.0–100.0)
Platelets: 153 10*3/uL (ref 150–400)
RBC: 2.67 MIL/uL — ABNORMAL LOW (ref 3.87–5.11)
RDW: 14 % (ref 11.5–15.5)
WBC: 7.5 10*3/uL (ref 4.0–10.5)

## 2013-11-15 LAB — BASIC METABOLIC PANEL
BUN: 7 mg/dL (ref 6–23)
CHLORIDE: 94 meq/L — AB (ref 96–112)
CO2: 26 mEq/L (ref 19–32)
Calcium: 8.1 mg/dL — ABNORMAL LOW (ref 8.4–10.5)
Creatinine, Ser: 0.55 mg/dL (ref 0.50–1.10)
GFR calc non Af Amer: 89 mL/min — ABNORMAL LOW (ref >90)
Glucose, Bld: 118 mg/dL — ABNORMAL HIGH (ref 70–99)
Potassium: 4 mEq/L (ref 3.7–5.3)
Sodium: 130 mEq/L — ABNORMAL LOW (ref 137–147)

## 2013-11-15 MED ORDER — SODIUM CHLORIDE 0.9 % IV BOLUS (SEPSIS)
500.0000 mL | Freq: Once | INTRAVENOUS | Status: AC
  Administered 2013-11-14: 500 mL via INTRAVENOUS

## 2013-11-15 NOTE — Progress Notes (Addendum)
DR Tamera Punt office notified re:  IV nurse unable to insert PICC - recommend IR placement of PICC. 1430- PA called back and TO for IR PICC. 1500- Unable to get order in Epic - IV team referred - to call back. 30- PA paged by office re: IR order.

## 2013-11-15 NOTE — Progress Notes (Signed)
Attempted PICC insertion in left basilic and left brachial veins but unable to advance guidewire due to vein constriction.  Informed staff RN who will contact physician for IR to place PICC. Pt tolerated procedure well

## 2013-11-15 NOTE — Progress Notes (Signed)
Pts BP 82/42 manually. PA paged, received orders for a one time 500 cc bolus of NS. After 500 cc bolus, pts BP 97/43. Will continue to monitor.

## 2013-11-15 NOTE — Progress Notes (Signed)
INFECTIOUS DISEASE PROGRESS NOTE  ID: [name] is a 73 y.o. female with  Active Problems:   Wound infection complicating hardware   Hardware complicating wound infection  Subjective: Had drain from wound last pm.   Abtx:  Anti-infectives   Start     Dose/Rate Route Frequency Ordered Stop   11/14/13 0800  vancomycin (VANCOCIN) IVPB 1000 mg/200 mL premix     1,000 mg 200 mL/hr over 60 Minutes Intravenous Every 24 hours 11/14/13 0749        Medications:  Scheduled: . aspirin EC  81 mg Oral Daily  . atorvastatin  10 mg Oral q1800  . diltiazem  180 mg Oral q morning - 10a  . docusate sodium  100 mg Oral BID  . levETIRAcetam  1,500 mg Oral BID  . levothyroxine  50 mcg Oral QPM  . Mesalamine  1,600 mg Oral TID  . PHENobarbital  97.2 mg Oral q morning - 10a  . vancomycin  1,000 mg Intravenous Q24H    Objective: Vital signs in last 24 hours: Temp:  [98.1 F (36.7 C)-98.8 F (37.1 C)] 98.8 F (37.1 C) (05/15 0401) Pulse Rate:  [79-104] 104 (05/15 1315) Resp:  [14-20] 20 (05/15 1315) BP: (82-97)/(42-46) 96/46 mmHg (05/15 0401) SpO2:  [93 %-100 %] 98 % (05/15 1315)   General appearance: alert, cooperative and no distress Incision/Wound: R shoulder dressed.   Lab Results  Recent Labs  11/14/13 0750 11/15/13 0450  WBC 8.1 7.5  HGB 10.8* 8.7*  HCT 31.0* 25.5*  NA  --  130*  K  --  4.0  CL  --  94*  CO2  --  26  BUN  --  7  CREATININE  --  0.55   Liver Panel No results found for this basename: PROT, ALBUMIN, AST, ALT, ALKPHOS, BILITOT, BILIDIR, IBILI,  in the last 72 hours Sedimentation Rate  Recent Labs  11/14/13 0750  ESRSEDRATE 86*   C-Reactive Protein  Recent Labs  11/14/13 0750  CRP 7.9*    Microbiology: Recent Results (from the past 240 hour(s))  SURGICAL PCR SCREEN     Status: None   Collection Time    11/14/13  6:07 AM      Result Value Ref Range Status   MRSA, PCR NEGATIVE  NEGATIVE Final   Staphylococcus aureus NEGATIVE   NEGATIVE Final   Comment:            The Xpert SA Assay (FDA     approved for NASAL specimens     in patients over 56 years of age),     is one component of     a comprehensive surveillance     program.  Test performance has     been validated by [employer] for patients greater     than or equal to 68 year old.     It is not intended     to diagnose infection nor to     guide or monitor treatment.  CULTURE, ROUTINE-ABSCESS     Status: None   Collection Time    11/14/13  9:58 AM      Result Value Ref Range Status   Specimen Description ABSCESS SHOULDER RIGHT   Final   Special Requests NONE   Final   Gram Stain     Final   Value: RARE WBC PRESENT,BOTH PMN AND MONONUCLEAR     RARE SQUAMOUS EPITHELIAL CELLS PRESENT  NO ORGANISMS SEEN     Performed at [facility]     Final   Value: NO GROWTH 1 DAY     Performed at [facility]   Report Status PENDING   Incomplete  ANAEROBIC CULTURE     Status: None   Collection Time    11/14/13  9:58 AM      Result Value Ref Range Status   Specimen Description ABSCESS SHOULDER RIGHT   Final   Special Requests NONE   Final   Gram Stain     Final   Value: RARE WBC PRESENT,BOTH PMN AND MONONUCLEAR     RARE SQUAMOUS EPITHELIAL CELLS PRESENT     NO ORGANISMS SEEN     Performed at [facility]   Culture     Final   Value: NO ANAEROBES ISOLATED     Note: CULTURE IN PROGRESS FOR 14 DAYS     Performed at [facility]   Report Status PENDING   Incomplete    Studies/Results: No results found.   Assessment/Plan: Osteomyelitis of L arm  C diff 05-19-11  Multiple Previous Cancers  Total days of antibiotics: 2 vanco Would continue vanco Await her Cx To get PIC in IR Dr Linus Salmons will check in over weekend    Previous Cx-  (07-15-13) Enterococcus sp (s-amp, vanco, gent)       Campbell Riches Infectious Diseases (pager) [phone] [email or web address] 11/15/2013, 1:53 PM  LOS: 2 days    **Disclaimer: This note may have been dictated with voice recognition software. Similar sounding words can inadvertently be transcribed and this note may contain transcription errors which may not have been corrected upon publication of note.**

## 2013-11-15 NOTE — Progress Notes (Signed)
   PATIENT ID: [name]   1 Day Post-Op Procedure(s) (LRB): RIGHT SHOUDER HARDWARE REMOVAL (Right) INCISION AND DRAINAGE (Right)  Subjective: Patient sitting up and walking to bathroom this am, tolerating that well. Reports pain in right shoulder but is tolerable.   Objective:  Filed Vitals:   11/15/13 0915  BP:   Pulse: 80  Temp:   Resp: 18     Awake, alert, orientated R UE dressing c/d/i Drains in place Mild distal swelling of hand Distally NVI  Labs:   Recent Labs  11/14/13 0750 11/15/13 0450  HGB 10.8* 8.7*   Recent Labs  11/14/13 0750 11/15/13 0450  WBC 8.1 7.5  RBC 3.32* 2.67*  HCT 31.0* 25.5*  PLT 160 153   Recent Labs  11/15/13 0450  NA 130*  K 4.0  CL 94*  CO2 26  BUN 7  CREATININE 0.55  GLUCOSE 118*  CALCIUM 8.1*    Assessment and Plan: 1 day s/p I & D hardware removal right shoulder  Continue drains until Monday Infectious disease on board, PICC line ordered and likely placed today vanc for now and will awake cx from surgery yesterday Previous cx results faxed to Dr. Johnnye Sima collection dated 07/15/13 from [facility]- significant for moderate enterococcus species Low BP overnight, responded to 500cc fluid bolus, will continue to monitor Na low, will repeat labs daily to see if trending down  ABLA- expected, will repeat labs tomorrow    VTE proph: ASA 81 daily,SCDs

## 2013-11-15 NOTE — Progress Notes (Signed)
Peripherally Inserted Central Catheter/Midline Placement  The IV Nurse has discussed with the patient and/or persons authorized to consent for the patient, the purpose of this procedure and the potential benefits and risks involved with this procedure.  The benefits include less needle sticks, lab draws from the catheter and patient may be discharged home with the catheter.  Risks include, but not limited to, infection, bleeding, blood clot (thrombus formation), and puncture of an artery; nerve damage and irregular heat beat.  Alternatives to this procedure were also discussed.  PICC/Midline Placement Documentation  PICC Double Lumen (Ped) 07/24/13 PICC Left Arm 36 cm (Active)       Murvin Natal 11/15/2013, 12:24 PM

## 2013-11-16 ENCOUNTER — Inpatient Hospital Stay (HOSPITAL_COMMUNITY): Payer: Medicare Other

## 2013-11-16 DIAGNOSIS — M869 Osteomyelitis, unspecified: Secondary | ICD-10-CM

## 2013-11-16 LAB — COMPREHENSIVE METABOLIC PANEL
ALK PHOS: 79 U/L (ref 39–117)
ALT: 15 U/L (ref 0–35)
AST: 16 U/L (ref 0–37)
Albumin: 2.7 g/dL — ABNORMAL LOW (ref 3.5–5.2)
BILIRUBIN TOTAL: 0.3 mg/dL (ref 0.3–1.2)
BUN: 5 mg/dL — ABNORMAL LOW (ref 6–23)
CHLORIDE: 95 meq/L — AB (ref 96–112)
CO2: 25 mEq/L (ref 19–32)
Calcium: 8.3 mg/dL — ABNORMAL LOW (ref 8.4–10.5)
Creatinine, Ser: 0.49 mg/dL — ABNORMAL LOW (ref 0.50–1.10)
GFR calc non Af Amer: >90 mL/min (ref >90)
GLUCOSE: 120 mg/dL — AB (ref 70–99)
POTASSIUM: 3.9 meq/L (ref 3.7–5.3)
Sodium: 131 mEq/L — ABNORMAL LOW (ref 137–147)
TOTAL PROTEIN: 6.3 g/dL (ref 6.0–8.3)

## 2013-11-16 LAB — CBC
HCT: 24 % — ABNORMAL LOW (ref 36.0–46.0)
HEMOGLOBIN: 8.4 g/dL — AB (ref 12.0–15.0)
MCH: 32.9 pg (ref 26.0–34.0)
MCHC: 35 g/dL (ref 30.0–36.0)
MCV: 94.1 fL (ref 78.0–100.0)
Platelets: 169 10*3/uL (ref 150–400)
RBC: 2.55 MIL/uL — ABNORMAL LOW (ref 3.87–5.11)
RDW: 13.9 % (ref 11.5–15.5)
WBC: 7.2 10*3/uL (ref 4.0–10.5)

## 2013-11-16 NOTE — Progress Notes (Signed)
PATIENT ID: [name]  MRN: [id number]  DOB/AGE:  1941-06-16 / 73 y.o.  2 Days Post-Op Procedure(s) (LRB): RIGHT SHOUDER HARDWARE REMOVAL (Right) INCISION AND DRAINAGE (Right)    PROGRESS NOTE Subjective:   Patient is alert, oriented, no Nausea, no Vomiting, yes passing gas, yes Bowel Movement. Taking PO well. Denies SOB, Chest or Calf Pain. Using Incentive Spirometer, PAS in place. Ambulate WBAT, Patient reports pain as mild,     Objective: Vital signs in last 24 hours: Temp:  [98.2 F (36.8 C)-98.4 F (36.9 C)] 98.2 F (36.8 C) (05/16 0712) Pulse Rate:  [80-104] 85 (05/16 0712) Resp:  [16-20] 16 (05/16 0712) BP: (89-97)/(60-84) 95/84 mmHg (05/16 0712) SpO2:  [91 %-100 %] 92 % (05/16 0712) FiO2 (%):  [28 %] 28 % (05/16 0108)    Intake/Output from previous day: I/O last 3 completed shifts: In: 960 [P.O.:960] Out: 687 [Urine:650; Drains:37]   Intake/Output this shift:     LABORATORY DATA:  Recent Labs  11/15/13 0450 11/16/13 0640  WBC 7.5 7.2  HGB 8.7* 8.4*  HCT 25.5* 24.0*  PLT 153 169  NA 130* 131*  K 4.0 3.9  CL 94* 95*  CO2 26 25  BUN 7 5*  CREATININE 0.55 0.49*  GLUCOSE 118* 120*  CALCIUM 8.1* 8.3*    Examination: Neurologically intact Neurovascular intact Sensation intact distally Intact pulses distally} Mild swelling in hand  Assessment:   2 Days Post-Op Procedure(s) (LRB): RIGHT SHOUDER HARDWARE REMOVAL (Right) INCISION AND DRAINAGE (Right) ADDITIONAL DIAGNOSIS:  Hypertension, afib  Plan: VTE proph: ASA 81 daily,SCDs  Continue drains until Monday  Infectious disease on board, PICC line ordered awaiting IR placement vanc for now and will awake cx from surgery yesterday  Previous cx results faxed to Dr. Johnnye Sima collection dated 07/15/13 from [place] labs- significant for moderate enterococcus species   Na low, will repeat labs daily, trending up           Leighton Parody 11/16/2013, 8:11 AM

## 2013-11-16 NOTE — Progress Notes (Addendum)
[facility] for Infectious Disease  Date of Admission:  11/13/2013  Antibiotics: vancomycin  Subjective: Currently getting picc line in IR  Objective: Temp:  [98.2 F (36.8 C)-98.4 F (36.9 C)] 98.2 F (36.8 C) (05/16 0712) Pulse Rate:  [80-104] 85 (05/16 0712) Resp:  [16-20] 16 (05/16 0712) BP: (89-97)/(60-84) 95/84 mmHg (05/16 0712) SpO2:  [91 %-100 %] 94 % (05/16 0846) FiO2 (%):  [28 %] 28 % (05/16 0108)  General:pt not available  Lab Results Lab Results  Component Value Date   WBC 7.2 11/16/2013   HGB 8.4* 11/16/2013   HCT 24.0* 11/16/2013   MCV 94.1 11/16/2013   PLT 169 11/16/2013    Lab Results  Component Value Date   CREATININE 0.49* 11/16/2013   BUN 5* 11/16/2013   NA 131* 11/16/2013   K 3.9 11/16/2013   CL 95* 11/16/2013   CO2 25 11/16/2013    Lab Results  Component Value Date   ALT 15 11/16/2013   AST 16 11/16/2013   ALKPHOS 79 11/16/2013   BILITOT 0.3 11/16/2013      Microbiology: Recent Results (from the past 240 hour(s))  SURGICAL PCR SCREEN     Status: None   Collection Time    11/14/13  6:07 AM      Result Value Ref Range Status   MRSA, PCR NEGATIVE  NEGATIVE Final   Staphylococcus aureus NEGATIVE  NEGATIVE Final   Comment:            The Xpert SA Assay (FDA     approved for NASAL specimens     in patients over 25 years of age),     is one component of     a comprehensive surveillance     program.  Test performance has     been validated by [employer] for patients greater     than or equal to 65 year old.     It is not intended     to diagnose infection nor to     guide or monitor treatment.  CULTURE, ROUTINE-ABSCESS     Status: None   Collection Time    11/14/13  9:58 AM      Result Value Ref Range Status   Specimen Description ABSCESS SHOULDER RIGHT   Final   Special Requests NONE   Final   Gram Stain     Final   Value: RARE WBC PRESENT,BOTH PMN AND MONONUCLEAR     RARE SQUAMOUS EPITHELIAL CELLS PRESENT     NO ORGANISMS SEEN      Performed at [facility]   Culture     Final   Value: NO GROWTH 2 DAYS     Performed at [facility]   Report Status PENDING   Incomplete  FUNGUS CULTURE W SMEAR     Status: None   Collection Time    11/14/13  9:58 AM      Result Value Ref Range Status   Specimen Description ABSCESS SHOULDER RIGHT   Final   Special Requests NONE   Final   Fungal Smear     Final   Value: NO YEAST OR FUNGAL ELEMENTS SEEN     Performed at [facility]   Culture     Final   Value: CULTURE IN PROGRESS FOR FOUR WEEKS     Performed at [facility]   Report Status PENDING   Incomplete  ANAEROBIC CULTURE  Status: None   Collection Time    11/14/13  9:58 AM      Result Value Ref Range Status   Specimen Description ABSCESS SHOULDER RIGHT   Final   Special Requests NONE   Final   Gram Stain     Final   Value: RARE WBC PRESENT,BOTH PMN AND MONONUCLEAR     RARE SQUAMOUS EPITHELIAL CELLS PRESENT     NO ORGANISMS SEEN     Performed at [facility]   Culture     Final   Value: NO ANAEROBES ISOLATED     Note: CULTURE IN PROGRESS FOR 14 DAYS     Performed at [facility]   Report Status PENDING   Incomplete    Studies/Results: Ir Fluoro Guide Cv Line Left  11/16/2013   CLINICAL DATA:  Hardware infection  EXAM: Left PICC LINE PLACEMENT WITH ULTRASOUND AND FLUOROSCOPIC GUIDANCE  FLUOROSCOPY TIME:  6 seconds.  PROCEDURE: The patient was advised of the possible risks andcomplications and agreed to undergo the procedure. The patient was then brought to the angiographic suite for the procedure.  The left arm was prepped with chlorhexidine, drapedin the usual sterile fashion using maximum barrier technique (cap and mask, sterile gown, sterile gloves, large sterile sheet, hand hygiene and cutaneous antisepsis) and infiltrated locally with 1% Lidocaine.  Ultrasound demonstrated patency of the left basilar vein, and this was documented with an image. Under real-time  ultrasound guidance, this vein was accessed with a 21 gauge micropuncture needle and image documentation was performed. A 0.018 wire was introduced in to the vein. Over this, a 5 [place] single lumen power PICC was advanced to the lower SVC/right atrial junction. Fluoroscopy during the procedure and fluoro spot radiograph confirms appropriate catheter position. The catheter was flushed and covered with asterile dressing.  Complications: None.  IMPRESSION: Successful left arm Power PICC line placement with ultrasound and fluoroscopic guidance. The catheter is ready for use.   Electronically Signed   By: Maryclare Bean M.D.   On: 11/16/2013 11:10   Ir US Guide Vasc Access Left  11/16/2013   CLINICAL DATA:  Hardware infection  EXAM: Left PICC LINE PLACEMENT WITH ULTRASOUND AND FLUOROSCOPIC GUIDANCE  FLUOROSCOPY TIME:  6 seconds.  PROCEDURE: The patient was advised of the possible risks andcomplications and agreed to undergo the procedure. The patient was then brought to the angiographic suite for the procedure.  The left arm was prepped with chlorhexidine, drapedin the usual sterile fashion using maximum barrier technique (cap and mask, sterile gown, sterile gloves, large sterile sheet, hand hygiene and cutaneous antisepsis) and infiltrated locally with 1% Lidocaine.  Ultrasound demonstrated patency of the left basilar vein, and this was documented with an image. Under real-time ultrasound guidance, this vein was accessed with a 21 gauge micropuncture needle and image documentation was performed. A 0.018 wire was introduced in to the vein. Over this, a 5 [place] single lumen power PICC was advanced to the lower SVC/right atrial junction. Fluoroscopy during the procedure and fluoro spot radiograph confirms appropriate catheter position. The catheter was flushed and covered with asterile dressing.  Complications: None.  IMPRESSION: Successful left arm Power PICC line placement with ultrasound and fluoroscopic guidance. The  catheter is ready for use.   Electronically Signed   By: Maryclare Bean M.D.   On: 11/16/2013 11:10    Assessment/Plan: 1) osteomyelitis - awaiting culture.  Previous Enterococcus.   Thayer Headings, [place] for Infectious Disease [facility]  Group [email or web address] [phone] pager   [phone] cell 11/16/2013, 11:57 AM

## 2013-11-16 NOTE — Procedures (Signed)
LUE PICC 41 cm SVC RA No comp

## 2013-11-17 LAB — CULTURE, ROUTINE-ABSCESS: Culture: NO GROWTH

## 2013-11-17 LAB — COMPREHENSIVE METABOLIC PANEL
ALT: 17 U/L (ref 0–35)
AST: 16 U/L (ref 0–37)
Albumin: 3 g/dL — ABNORMAL LOW (ref 3.5–5.2)
Alkaline Phosphatase: 91 U/L (ref 39–117)
BUN: 5 mg/dL — AB (ref 6–23)
CO2: 26 mEq/L (ref 19–32)
Calcium: 8.9 mg/dL (ref 8.4–10.5)
Chloride: 97 mEq/L (ref 96–112)
Creatinine, Ser: 0.44 mg/dL — ABNORMAL LOW (ref 0.50–1.10)
GFR calc Af Amer: >90 mL/min (ref >90)
GFR calc non Af Amer: >90 mL/min (ref >90)
Glucose, Bld: 119 mg/dL — ABNORMAL HIGH (ref 70–99)
POTASSIUM: 3.8 meq/L (ref 3.7–5.3)
Sodium: 136 mEq/L — ABNORMAL LOW (ref 137–147)
Total Bilirubin: 0.3 mg/dL (ref 0.3–1.2)
Total Protein: 6.9 g/dL (ref 6.0–8.3)

## 2013-11-17 LAB — CBC
HEMATOCRIT: 27.2 % — AB (ref 36.0–46.0)
Hemoglobin: 9.2 g/dL — ABNORMAL LOW (ref 12.0–15.0)
MCH: 32.2 pg (ref 26.0–34.0)
MCHC: 33.8 g/dL (ref 30.0–36.0)
MCV: 95.1 fL (ref 78.0–100.0)
PLATELETS: 215 10*3/uL (ref 150–400)
RBC: 2.86 MIL/uL — ABNORMAL LOW (ref 3.87–5.11)
RDW: 13.9 % (ref 11.5–15.5)
WBC: 7.7 10*3/uL (ref 4.0–10.5)

## 2013-11-17 MED ORDER — SODIUM CHLORIDE 0.9 % IJ SOLN
10.0000 mL | INTRAMUSCULAR | Status: DC | PRN
  Administered 2013-11-18: 10 mL

## 2013-11-17 MED ORDER — SODIUM CHLORIDE 0.9 % IJ SOLN: 10.0000 mL | Freq: Two times a day (BID) | INTRAMUSCULAR | Status: DC

## 2013-11-17 NOTE — Progress Notes (Signed)
PATIENT ID: [name]  MRN: [id number]  DOB/AGE:  1940-11-21 / 73 y.o.  3 Days Post-Op Procedure(s) (LRB): RIGHT SHOUDER HARDWARE REMOVAL (Right) INCISION AND DRAINAGE (Right)    PROGRESS NOTE Subjective:   Patient is alert, oriented, no Nausea, no Vomiting, yes passing gas, yes Bowel Movement. Taking PO well. Denies SOB, Chest or Calf Pain. Using Incentive Spirometer, PAS in place. Ambulate WBAT, Patient reports pain as mild. Pt pulled drain last night on her own.      Objective: Vital signs in last 24 hours: Temp:  [98.3 F (36.8 C)-98.5 F (36.9 C)] 98.4 F (36.9 C) (05/17 0649) Pulse Rate:  [83-108] 108 (05/17 0649) Resp:  [16-18] 18 (05/17 0649) BP: (102-132)/(51-65) 132/65 mmHg (05/17 0649) SpO2:  [92 %-94 %] 94 % (05/17 0649)    Intake/Output from previous day: I/O last 3 completed shifts: In: 1080 [P.O.:1080] Out: 10 [Drains:10]   Intake/Output this shift:     LABORATORY DATA:  Recent Labs  11/15/13 0450 11/16/13 0640  WBC 7.5 7.2  HGB 8.7* 8.4*  HCT 25.5* 24.0*  PLT 153 169  NA 130* 131*  K 4.0 3.9  CL 94* 95*  CO2 26 25  BUN 7 5*  CREATININE 0.55 0.49*  GLUCOSE 118* 120*  CALCIUM 8.1* 8.3*    Examination: Neurologically intact Neurovascular intact Sensation intact distally Intact pulses distally Dorsiflexion/Plantar flexion intact} Pt continues to have edema in right hand and she was instructed on hand squeezing and elevation. Assessment:   3 Days Post-Op Procedure(s) (LRB): RIGHT SHOUDER HARDWARE REMOVAL (Right) INCISION AND DRAINAGE (Right) ADDITIONAL DIAGNOSIS:  Hypertension and afib  Plan: VTE proph: ASA 22 daily,SCDs  Pt pulled drains on own Infectious disease on board, PICC line placed yesterday by IR vanc for now and will awake cx from surgery yesterday  Previous cx results faxed to Dr. Johnnye Sima collection dated 07/15/13 from [place] labs- significant for moderate enterococcus species  Na low, will repeat labs daily, trending  up awaiting labs from today      Leighton Parody 11/17/2013, 8:03 AM

## 2013-11-18 LAB — COMPREHENSIVE METABOLIC PANEL
ALK PHOS: 86 U/L (ref 39–117)
ALT: 16 U/L (ref 0–35)
AST: 14 U/L (ref 0–37)
Albumin: 2.7 g/dL — ABNORMAL LOW (ref 3.5–5.2)
BILIRUBIN TOTAL: 0.3 mg/dL (ref 0.3–1.2)
BUN: 3 mg/dL — ABNORMAL LOW (ref 6–23)
CO2: 29 meq/L (ref 19–32)
Calcium: 8.6 mg/dL (ref 8.4–10.5)
Chloride: 97 mEq/L (ref 96–112)
Creatinine, Ser: 0.46 mg/dL — ABNORMAL LOW (ref 0.50–1.10)
GFR calc Af Amer: >90 mL/min (ref >90)
Glucose, Bld: 108 mg/dL — ABNORMAL HIGH (ref 70–99)
POTASSIUM: 3.9 meq/L (ref 3.7–5.3)
SODIUM: 135 meq/L — AB (ref 137–147)
Total Protein: 6.2 g/dL (ref 6.0–8.3)

## 2013-11-18 LAB — VANCOMYCIN, TROUGH: VANCOMYCIN TR: 7.6 ug/mL — AB (ref 10.0–20.0)

## 2013-11-18 MED ORDER — VANCOMYCIN HCL IN DEXTROSE 750-5 MG/150ML-% IV SOLN
750.0000 mg | Freq: Two times a day (BID) | INTRAVENOUS | Status: DC
  Filled 2013-11-18 (×2): qty 150

## 2013-11-18 MED ORDER — VANCOMYCIN HCL IN DEXTROSE 750-5 MG/150ML-% IV SOLN: 750.0000 mg | Freq: Two times a day (BID) | INTRAVENOUS | Status: DC

## 2013-11-18 MED ORDER — HEPARIN SOD (PORK) LOCK FLUSH 100 UNIT/ML IV SOLN
250.0000 [IU] | INTRAVENOUS | Status: AC | PRN
Start: 2013-11-18 — End: 2013-11-18
  Administered 2013-11-18: 250 [IU]

## 2013-11-18 MED ORDER — OXYCODONE-ACETAMINOPHEN 5-325 MG PO TABS: 1.0000 | ORAL_TABLET | ORAL | Status: DC | PRN

## 2013-11-18 NOTE — Progress Notes (Signed)
ANTIBIOTIC CONSULT NOTE - INITIAL  Pharmacy Consult for vancomycin Indication: wound infection  No Known Allergies  Patient Measurements: Height: 5\' 5"  (165.1 cm) Weight: 124 lb (56.246 kg) IBW/kg (Calculated) : 57  Vital Signs: Temp: 97.9 F (36.6 C) (05/18 0404) BP: 103/66 mmHg (05/18 0404) Pulse Rate: 94 (05/18 0404)  Labs:  Recent Labs  11/16/13 0640 11/17/13 0800 11/18/13 0500  WBC 7.2 7.7  --   HGB 8.4* 9.2*  --   PLT 169 215  --   CREATININE 0.49* 0.44* 0.46*   Estimated Creatinine Clearance: 53.9 ml/min (by C-G formula based on Cr of 0.46).   Microbiology: Recent Results (from the past 720 hour(s))  SURGICAL PCR SCREEN     Status: None   Collection Time    11/14/13  6:07 AM      Result Value Ref Range Status   MRSA, PCR NEGATIVE  NEGATIVE Final   Staphylococcus aureus NEGATIVE  NEGATIVE Final   Comment:            The Xpert SA Assay (FDA     approved for NASAL specimens     in patients over 38 years of age),     is one component of     a comprehensive surveillance     program.  Test performance has     been validated by [employer] for patients greater     than or equal to 70 year old.     It is not intended     to diagnose infection nor to     guide or monitor treatment.  CULTURE, ROUTINE-ABSCESS     Status: None   Collection Time    11/14/13  9:58 AM      Result Value Ref Range Status   Specimen Description ABSCESS SHOULDER RIGHT   Final   Special Requests NONE   Final   Gram Stain     Final   Value: RARE WBC PRESENT,BOTH PMN AND MONONUCLEAR     RARE SQUAMOUS EPITHELIAL CELLS PRESENT     NO ORGANISMS SEEN     Performed at [facility]   Culture     Final   Value: NO GROWTH 3 DAYS     Performed at [facility]   Report Status 11/17/2013 FINAL   Final  FUNGUS CULTURE W SMEAR     Status: None   Collection Time    11/14/13  9:58 AM      Result Value Ref Range Status   Specimen Description ABSCESS SHOULDER RIGHT   Final    Special Requests NONE   Final   Fungal Smear     Final   Value: NO YEAST OR FUNGAL ELEMENTS SEEN     Performed at [facility]   Culture     Final   Value: CULTURE IN PROGRESS FOR FOUR WEEKS     Performed at [facility]   Report Status PENDING   Incomplete  ANAEROBIC CULTURE     Status: None   Collection Time    11/14/13  9:58 AM      Result Value Ref Range Status   Specimen Description ABSCESS SHOULDER RIGHT   Final   Special Requests NONE   Final   Gram Stain     Final   Value: RARE WBC PRESENT,BOTH PMN AND MONONUCLEAR     RARE SQUAMOUS EPITHELIAL CELLS PRESENT     NO ORGANISMS SEEN  Performed at [facility]     Final   Value: NO ANAEROBES ISOLATED     Note: CULTURE IN PROGRESS FOR 14 DAYS     Performed at [facility]   Report Status PENDING   Incomplete  AFB CULTURE WITH SMEAR     Status: None   Collection Time    11/14/13  9:58 AM      Result Value Ref Range Status   Specimen Description ABSCESS SHOULDER RIGHT   Final   Special Requests NONE   Final   Acid Fast Smear     Final   Value: NO ACID FAST BACILLI SEEN     Performed at [facility]   Culture     Final   Value: CULTURE WILL BE EXAMINED FOR 6 WEEKS BEFORE ISSUING A FINAL REPORT     Performed at [facility]   Report Status PENDING   Incomplete    Medical History: Past Medical History  Diagnosis Date  . Peripheral vascular disease   . Subdural hematoma   . Ataxia   . Mild dysplasia of cervix   . UTI (lower urinary tract infection)   . Urinary incontinence   . Cerebrovascular disease   . Personal history of colonic polyps     adenomatous 1997 & tubular adenoma and hyplastic  2008  . Hyperlipidemia     takes Pravastatin daily  . H/O alcohol abuse   . Diverticulosis of colon (without mention of hemorrhage)   . Redundant colon   . C. difficile colitis   . Dementia t  . Ulcerative colitis     takes [place] daily  . Dysrhythmia     a  fib-takes Diltiazem daily  . Seizures     takes Phenobarbital and Keppra daily;last seizure was April 1,2014  . Stroke 2000    off balance on right side a little and peripheral vision loss on right side  . Arthritis   . Joint pain   . Joint swelling   . History of UTI 10-2012  . Hypothyroidism     takes Levothyroxine daily  . Squamous cell carcinoma of mouth   . Adenocarcinoma, breast     bilateral  . Anxiety   . Dementia   . Cancer of larynx   . Hypertension   . Atrial fibrillation   . History of breast cancer   . History of seizures   . Gait disorder   . Oropharyngeal cancer   . History of colitis     Medications:  Prescriptions prior to admission  Medication Sig Dispense Refill  . aspirin 81 MG tablet Take 81 mg by mouth daily.      . Calcium Carbonate-Vitamin D (CALCIUM 500 + D PO) Take 1 tablet by mouth daily.      . cephALEXin (KEFLEX) 500 MG capsule Take 500 mg by mouth 4 (four) times daily. Started 11/11/13, for 10 days.      Marland Kitchen diltiazem (DILACOR XR) 180 MG 24 hr capsule Take 180 mg by mouth every morning.      . ferrous sulfate 325 (65 FE) MG EC tablet Take 325 mg by mouth 2 (two) times daily.      Marland Kitchen levETIRAcetam (KEPPRA) 750 MG tablet Take 2 tablets (1,500 mg total) by mouth 2 (two) times daily.  120 tablet  0  . levothyroxine (SYNTHROID, LEVOTHROID) 50 MCG tablet Take 50 mcg by mouth every evening.      . lidocaine (  XYLOCAINE) 2 % solution Take 20 mLs by mouth every 3 (three) hours as needed for mouth pain. Oral pain      . Mesalamine (ASACOL HD) 800 MG TBEC Take 2 tablets (1,600 mg total) by mouth 3 (three) times daily.  144 tablet  0  . Multiple Vitamin (MULTIVITAMIN WITH MINERALS) TABS Take 1 tablet by mouth daily.      Marland Kitchen PHENobarbital (LUMINAL) 97.2 MG tablet Take 97.2 mg by mouth every morning.      . pravastatin (PRAVACHOL) 40 MG tablet Take 40 mg by mouth every evening.      . vitamin C (ASCORBIC ACID) 500 MG tablet Take 500 mg by mouth daily.        Scheduled:  . aspirin EC  81 mg Oral Daily  . atorvastatin  10 mg Oral q1800  . diltiazem  180 mg Oral q morning - 10a  . docusate sodium  100 mg Oral BID  . levETIRAcetam  1,500 mg Oral BID  . levothyroxine  50 mcg Oral QPM  . Mesalamine  1,600 mg Oral TID  . PHENobarbital  97.2 mg Oral q morning - 10a  . sodium chloride  10-40 mL Intracatheter Q12H  . vancomycin  750 mg Intravenous Q12H   Infusions:  . sodium chloride    . lactated ringers 75 mL/hr (11/14/13 0837)    Assessment: 72yo female on Vanc D#5 for OM (will need 6 weeks abx) - Afebrile, WBC WNL,(PTA keflex - recently on bactrim>>vanc in Februrary) - s/p I&D and hardware removal from R shoulder 5/14. Vancomycin trough subtherapeutic on 1g IV Q 24 hrs. Pt. Will likely discharge home today on IV vancomycin  Vanc 5/14>>  5/18 Vancomycin trough 7.6  5/14 Shoulder abscess - NEG  Goal of Therapy:  Vancomycin trough level 10-15 mcg/ml  Plan:  Will increase dose to 750 mg IV Q12 hrs Please f/u weekly BMET and vancomycin trough later this week.  Maryanna Shape, PharmD, BCPS  Clinical Pharmacist  Pager: [phone]   11/18/2013,1:55 PM

## 2013-11-18 NOTE — Progress Notes (Signed)
   PATIENT ID: [name]   4 Days Post-Op Procedure(s) (LRB): RIGHT SHOUDER HARDWARE REMOVAL (Right) INCISION AND DRAINAGE (Right)  Subjective: Doing well this am. Sitting up and eating breakfast. Ready to go home.   Objective:  Filed Vitals:   11/18/13 0404  BP: 103/66  Pulse: 94  Temp: 97.9 F (36.6 C)  Resp: 18     R UE dressing with spotted blood, dry, intact Drains out yesterday  Mild distal swelling in hand Wiggles fingers, distally NVI  Labs:   Recent Labs  11/16/13 0640 11/17/13 0800  HGB 8.4* 9.2*   Recent Labs  11/16/13 0640 11/17/13 0800  WBC 7.2 7.7  RBC 2.55* 2.86*  HCT 24.0* 27.2*  PLT 169 215   Recent Labs  11/17/13 0800 11/18/13 0500  NA 136* 135*  K 3.8 3.9  CL 97 97  CO2 26 29  BUN 5* 3*  CREATININE 0.44* 0.46*  GLUCOSE 119* 108*  CALCIUM 8.9 8.6    Assessment and Plan: 4 day s/p I & D/hardware removal right shoulder Dressing change today by nursing cx final labs with no growth x 3 days, will defer reccommended tx to ID team but likely d/c home with vancomycin If ID agrees, can d/c home today with IV vanc Percocet for home pain control, script in chart Follow up with Dr. Tamera Punt in 10 days   VTE proph: 81mg  ASA daily, SCDs

## 2013-11-18 NOTE — Progress Notes (Signed)
D/C Instructions and scripts given to Pt. Pt verbalized understanding of instructions and home health set up to [place] at [place] husband at bedside totake Pt home at this time.

## 2013-11-18 NOTE — Discharge Instructions (Signed)
IV pain medication as prescribed Follow up with Dr. Tamera Punt in 10 days Follow up with infectious disease clinic in 2 weeks bloodwork needed weekly Okay to wear sling for comfort, okay to discontinue sling if not comfortable Pain medicine has been prescribed for you.  Use your medicine liberally over the first 48 hours, and then you can begin to taper your use. You may take Extra Strength Tylenol or Tylenol only in place of the pain pills.    Please call [phone] during normal business hours or [phone] after hours for any problems. Including the following:  - excessive redness of the incisions - drainage for more than 4 days - fever of more than 101.5 F  *Please note that pain medications will not be refilled after hours or on weekends.

## 2013-11-18 NOTE — Care Management Note (Signed)
CARE MANAGEMENT NOTE 11/18/2013  Patient:  TOSHA, BELGARDE A   Account Number:  [id number]  Date Initiated:  11/18/2013  Documentation initiated by:  Ricki Miller  Subjective/Objective Assessment:   73 yr old female s/p ORIF of right shoulder. S/p I & D with hardware removal.     Action/Plan:   Case manager contacted patient's husband concerning HH needs for IV Vancx6weeks. Husband has experience with administering and carring for PICC.  Choice offered. 1st choice not available, Husband agreeable to [facility]. referral called.   Anticipated DC Date:  11/18/2013   Anticipated DC Plan:  [place]  CM consult      [facility] Choice  HOME HEALTH   Choice offered to / List presented to:  C-3 Spouse      DME agency  [place] arranged  HH-1 RN  IV Antibiotics      [place].   Status of service:  Completed, signed off Medicare Important Message given?  YES (If response is "NO", the following Medicare IM given date fields will be blank) Date Medicare IM given:  11/13/2013 Date Additional Medicare IM given:  11/18/2013  Discharge Disposition:  [place]  Per UR Regulation:    If discussed at Long Length of Stay Meetings, dates discussed:    Comments:

## 2013-11-18 NOTE — Progress Notes (Signed)
Pt had blood in her stool. Three small stools with blood noted in toilet. Pt states this is from her ulcerative colitis. Makes frequent trips to the [facility] because of her colitis.

## 2013-11-18 NOTE — Progress Notes (Addendum)
INFECTIOUS DISEASE PROGRESS NOTE  ID: [name] is a 73 y.o. female with  Active Problems:   Wound infection complicating hardware   Hardware complicating wound infection  Subjective: Without complaints  Abtx:  Anti-infectives   Start     Dose/Rate Route Frequency Ordered Stop   11/18/13 2000  vancomycin (VANCOCIN) IVPB 750 mg/150 ml premix     750 mg 150 mL/hr over 60 Minutes Intravenous Every 12 hours 11/18/13 1343     11/14/13 0800  vancomycin (VANCOCIN) IVPB 1000 mg/200 mL premix  Status:  Discontinued     1,000 mg 200 mL/hr over 60 Minutes Intravenous Every 24 hours 11/14/13 0749 11/18/13 1343      Medications:  Scheduled: . aspirin EC  81 mg Oral Daily  . atorvastatin  10 mg Oral q1800  . diltiazem  180 mg Oral q morning - 10a  . docusate sodium  100 mg Oral BID  . levETIRAcetam  1,500 mg Oral BID  . levothyroxine  50 mcg Oral QPM  . Mesalamine  1,600 mg Oral TID  . PHENobarbital  97.2 mg Oral q morning - 10a  . sodium chloride  10-40 mL Intracatheter Q12H  . vancomycin  750 mg Intravenous Q12H    Objective: Vital signs in last 24 hours: Temp:  [97.9 F (36.6 C)] 97.9 F (36.6 C) (05/18 0404) Pulse Rate:  [78-95] 94 (05/18 0404) Resp:  [18] 18 (05/18 0404) BP: (103-143)/(66-93) 103/66 mmHg (05/18 0404) SpO2:  [95 %-98 %] 95 % (05/18 0404)   General appearance: alert, cooperative and no distress Incision/Wound: R shoulder wrapped.   Lab Results  Recent Labs  11/16/13 0640 11/17/13 0800 11/18/13 0500  WBC 7.2 7.7  --   HGB 8.4* 9.2*  --   HCT 24.0* 27.2*  --   NA 131* 136* 135*  K 3.9 3.8 3.9  CL 95* 97 97  CO2 25 26 29   BUN 5* 5* 3*  CREATININE 0.49* 0.44* 0.46*   Liver Panel  Recent Labs  11/17/13 0800 11/18/13 0500  PROT 6.9 6.2  ALBUMIN 3.0* 2.7*  AST 16 14  ALT 17 16  ALKPHOS 91 86  BILITOT 0.3 0.3   Sedimentation Rate No results found for this basename: ESRSEDRATE,  in the last 72 hours C-Reactive Protein No  results found for this basename: CRP,  in the last 72 hours  Microbiology: Recent Results (from the past 240 hour(s))  SURGICAL PCR SCREEN     Status: None   Collection Time    11/14/13  6:07 AM      Result Value Ref Range Status   MRSA, PCR NEGATIVE  NEGATIVE Final   Staphylococcus aureus NEGATIVE  NEGATIVE Final   Comment:            The Xpert SA Assay (FDA     approved for NASAL specimens     in patients over 35 years of age),     is one component of     a comprehensive surveillance     program.  Test performance has     been validated by [employer] for patients greater     than or equal to 45 year old.     It is not intended     to diagnose infection nor to     guide or monitor treatment.  CULTURE, ROUTINE-ABSCESS     Status: None   Collection Time    11/14/13  9:58 AM  Result Value Ref Range Status   Specimen Description ABSCESS SHOULDER RIGHT   Final   Special Requests NONE   Final   Gram Stain     Final   Value: RARE WBC PRESENT,BOTH PMN AND MONONUCLEAR     RARE SQUAMOUS EPITHELIAL CELLS PRESENT     NO ORGANISMS SEEN     Performed at [facility]   Culture     Final   Value: NO GROWTH 3 DAYS     Performed at [facility]   Report Status 11/17/2013 FINAL   Final  FUNGUS CULTURE W SMEAR     Status: None   Collection Time    11/14/13  9:58 AM      Result Value Ref Range Status   Specimen Description ABSCESS SHOULDER RIGHT   Final   Special Requests NONE   Final   Fungal Smear     Final   Value: NO YEAST OR FUNGAL ELEMENTS SEEN     Performed at [facility]   Culture     Final   Value: CULTURE IN PROGRESS FOR FOUR WEEKS     Performed at [facility]   Report Status PENDING   Incomplete  ANAEROBIC CULTURE     Status: None   Collection Time    11/14/13  9:58 AM      Result Value Ref Range Status   Specimen Description ABSCESS SHOULDER RIGHT   Final   Special Requests NONE   Final   Gram Stain     Final   Value: RARE  WBC PRESENT,BOTH PMN AND MONONUCLEAR     RARE SQUAMOUS EPITHELIAL CELLS PRESENT     NO ORGANISMS SEEN     Performed at [facility]   Culture     Final   Value: NO ANAEROBES ISOLATED     Note: CULTURE IN PROGRESS FOR 14 DAYS     Performed at [facility]   Report Status PENDING   Incomplete  AFB CULTURE WITH SMEAR     Status: None   Collection Time    11/14/13  9:58 AM      Result Value Ref Range Status   Specimen Description ABSCESS SHOULDER RIGHT   Final   Special Requests NONE   Final   Acid Fast Smear     Final   Value: NO ACID FAST BACILLI SEEN     Performed at [facility]   Culture     Final   Value: CULTURE WILL BE EXAMINED FOR 6 WEEKS BEFORE ISSUING A FINAL REPORT     Performed at [facility]   Report Status PENDING   Incomplete    Studies/Results: No results found.   Assessment/Plan: Osteomyelitis of L arm (previous entoeroccus) C diff 05-19-11  Multiple Previous Cancers  Total days of antibiotics: 5 vanco  debridement Cx 5-14 (-) Will continue vanco, aim for 6 weeks.  vanco 74m q12h til June 24 Weekly CBC, BMP, ESR, CRP. Fax to [phone] follow with oral amox? Have her f/u in ID clinic in 2-3 weeks.          JCampbell RichesInfectious Diseases (pager) [phone] 11/18/2013, 2:05 PM  LOS: 5 days   **Disclaimer: This note may have been dictated with voice recognition software. Similar sounding words can inadvertently be transcribed and this note may contain transcription errors which may not have been corrected upon publication of note.**

## 2013-11-20 ENCOUNTER — Encounter: Payer: Self-pay | Admitting: Infectious Diseases

## 2013-11-21 NOTE — Discharge Summary (Signed)
Patient ID: KNYA ENDRIS MRN: [id number] DOB/AGE: [birth year] 73 y.o.  Admit date: 11/13/2013 Discharge date: 11/21/2013  Admission Diagnoses:  Active Problems:   Wound infection complicating hardware   Hardware complicating wound infection   Discharge Diagnoses:  Same  Past Medical History  Diagnosis Date  . Peripheral vascular disease   . Subdural hematoma   . Ataxia   . Mild dysplasia of cervix   . UTI (lower urinary tract infection)   . Urinary incontinence   . Cerebrovascular disease   . Personal history of colonic polyps     adenomatous 1997 & tubular adenoma and hyplastic  2008  . Hyperlipidemia     takes Pravastatin daily  . H/O alcohol abuse   . Diverticulosis of colon (without mention of hemorrhage)   . Redundant colon   . C. difficile colitis   . Dementia t  . Ulcerative colitis     takes [place] daily  . Dysrhythmia     a fib-takes Diltiazem daily  . Seizures     takes Phenobarbital and Keppra daily;last seizure was April 1,2014  . Stroke 2000    off balance on right side a little and peripheral vision loss on right side  . Arthritis   . Joint pain   . Joint swelling   . History of UTI 10-2012  . Hypothyroidism     takes Levothyroxine daily  . Squamous cell carcinoma of mouth   . Adenocarcinoma, breast     bilateral  . Anxiety   . Dementia   . Cancer of larynx   . Hypertension   . Atrial fibrillation   . History of breast cancer   . History of seizures   . Gait disorder   . Oropharyngeal cancer   . History of colitis     Surgeries: Procedure(s): RIGHT SHOUDER HARDWARE REMOVAL INCISION AND DRAINAGE on 11/13/2013 - 11/14/2013   Consultants:    Discharged Condition: Improved  Hospital Course: [name] is an 73 y.o. female who was admitted 11/13/2013 for operative treatment of infected right shoulder. 73 yo female over 1 year s/p right shoulder ORIF proximal humerus fracture. Had history of infection right shoulder improved with IV  vancomycin x 4 weeks in January. Presented to clinic yesterday with 4 day history of worsening right shoulder pain, swelling, redness, warmth. 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   After pre-op clearance the patient was taken to the operating room on 11/13/2013 - 11/14/2013 and underwent  Procedure(s): [place].    Patient was given perioperative antibiotics:  Anti-infectives   Start     Dose/Rate Route Frequency Ordered Stop   11/18/13 2000  vancomycin (VANCOCIN) IVPB 750 mg/150 ml premix  Status:  Discontinued     750 mg 150 mL/hr over 60 Minutes Intravenous Every 12 hours 11/18/13 1343 11/18/13 2024   11/18/13 0000  Vancomycin (VANCOCIN) 750 MG/150ML SOLN    Comments:  Okay to fill 1 weeks worth (14 units) of 138mL at a time if appropriate   750 mg 150 mL/hr over 60 Minutes Intravenous Every 12 hours 11/18/13 1447     11/14/13 0800  vancomycin (VANCOCIN) IVPB 1000 mg/200 mL premix  Status:  Discontinued     1,000 mg 200 mL/hr over 60 Minutes Intravenous Every 24 hours 11/14/13 0749 11/18/13 1343       Patient was given sequential compression devices, early ambulation, and ASA  81mg  daily to prevent DVT.  Patient benefited maximally from hospital stay and there were no complications.  Infectious disease consulted and appropriate IV abx (vancomycin) given via PICC line.   Recent vital signs: No data found.    Recent laboratory studies: No results found for this basename: WBC, HGB, HCT, PLT, NA, K, CL, CO2, BUN, CREATININE, GLUCOSE, PT, INR, CALCIUM, 2,  in the last 72 hours   Discharge Medications:     Medication List         aspirin 81 MG tablet  Take 81 mg by mouth daily.     CALCIUM 500 + D PO  Take 1 tablet by mouth daily.     cephALEXin 500 MG capsule  Commonly known as:  KEFLEX  Take 500 mg by mouth 4 (four) times daily. Started  11/11/13, for 10 days.     diltiazem 180 MG 24 hr capsule  Commonly known as:  DILACOR XR  Take 180 mg by mouth every morning.     ferrous sulfate 325 (65 FE) MG EC tablet  Take 325 mg by mouth 2 (two) times daily.     levETIRAcetam 750 MG tablet  Commonly known as:  KEPPRA  Take 2 tablets (1,500 mg total) by mouth 2 (two) times daily.     levothyroxine 50 MCG tablet  Commonly known as:  SYNTHROID, LEVOTHROID  Take 50 mcg by mouth every evening.     lidocaine 2 % solution  Commonly known as:  XYLOCAINE  Take 20 mLs by mouth every 3 (three) hours as needed for mouth pain. Oral pain     Mesalamine 800 MG Tbec  Commonly known as:  ASACOL HD  Take 2 tablets (1,600 mg total) by mouth 3 (three) times daily.     multivitamin with minerals Tabs tablet  Take 1 tablet by mouth daily.     oxyCODONE-acetaminophen 5-325 MG per tablet  Commonly known as:  ROXICET  Take 1-2 tablets by mouth every 4 (four) hours as needed for severe pain.     PHENobarbital 97.2 MG tablet  Commonly known as:  LUMINAL  Take 97.2 mg by mouth every morning.     pravastatin 40 MG tablet  Commonly known as:  PRAVACHOL  Take 40 mg by mouth every evening.     Vancomycin 750 MG/150ML Soln  Commonly known as:  VANCOCIN  Inject 150 mLs (750 mg total) into the vein every 12 (twelve) hours.     vitamin C 500 MG tablet  Commonly known as:  ASCORBIC ACID  Take 500 mg by mouth daily.        Diagnostic Studies: Ir Fluoro Guide Cv Line Left  11/16/2013   CLINICAL DATA:  Hardware infection  EXAM: Left PICC LINE PLACEMENT WITH ULTRASOUND AND FLUOROSCOPIC GUIDANCE  FLUOROSCOPY TIME:  6 seconds.  PROCEDURE: The patient was advised of the possible risks andcomplications and agreed to undergo the procedure. The patient was then brought to the angiographic suite for the procedure.  The left arm was prepped with chlorhexidine, drapedin the usual sterile fashion using maximum barrier technique (cap and mask, sterile gown,  sterile gloves, large sterile sheet, hand hygiene and cutaneous antisepsis) and infiltrated locally with 1% Lidocaine.  Ultrasound demonstrated patency of the left basilar vein, and this was documented with an image. Under real-time ultrasound guidance, this vein was accessed with a 21 gauge micropuncture needle and image documentation was performed. A 0.018 wire was introduced in to the vein. Over this, a 5  [place] single lumen power PICC was advanced to the lower SVC/right atrial junction. Fluoroscopy during the procedure and fluoro spot radiograph confirms appropriate catheter position. The catheter was flushed and covered with asterile dressing.  Complications: None.  IMPRESSION: Successful left arm Power PICC line placement with ultrasound and fluoroscopic guidance. The catheter is ready for use.   Electronically Signed   By: Maryclare Bean M.D.   On: 11/16/2013 11:10   Ir US Guide Vasc Access Left  11/16/2013   CLINICAL DATA:  Hardware infection  EXAM: Left PICC LINE PLACEMENT WITH ULTRASOUND AND FLUOROSCOPIC GUIDANCE  FLUOROSCOPY TIME:  6 seconds.  PROCEDURE: The patient was advised of the possible risks andcomplications and agreed to undergo the procedure. The patient was then brought to the angiographic suite for the procedure.  The left arm was prepped with chlorhexidine, drapedin the usual sterile fashion using maximum barrier technique (cap and mask, sterile gown, sterile gloves, large sterile sheet, hand hygiene and cutaneous antisepsis) and infiltrated locally with 1% Lidocaine.  Ultrasound demonstrated patency of the left basilar vein, and this was documented with an image. Under real-time ultrasound guidance, this vein was accessed with a 21 gauge micropuncture needle and image documentation was performed. A 0.018 wire was introduced in to the vein. Over this, a 5 [place] single lumen power PICC was advanced to the lower SVC/right atrial junction. Fluoroscopy during the procedure and fluoro spot  radiograph confirms appropriate catheter position. The catheter was flushed and covered with asterile dressing.  Complications: None.  IMPRESSION: Successful left arm Power PICC line placement with ultrasound and fluoroscopic guidance. The catheter is ready for use.   Electronically Signed   By: Maryclare Bean M.D.   On: 11/16/2013 11:10    Disposition: 01-Home or Self Care      Discharge Instructions   Call MD / Call 911    Complete by:  As directed   If you experience chest pain or shortness of breath, CALL 911 and be transported to the hospital emergency room.  If you develope a fever above 101 F, pus (white drainage) or increased drainage or redness at the wound, or calf pain, call your surgeon's office.     Constipation Prevention    Complete by:  As directed   Drink plenty of fluids.  Prune juice may be helpful.  You may use a stool softener, such as Colace (over the counter) 100 mg twice a day.  Use MiraLax (over the counter) for constipation as needed.     Diet - low sodium heart healthy    Complete by:  As directed      Increase activity slowly as tolerated    Complete by:  As directed            Follow-up Information   Follow up with Nita Sells, MD. Schedule an appointment as soon as possible for a visit in 10 days.   Specialty:  Orthopedic Surgery   Contact information:   [address] [place] [address] [phone]       Follow up with [facility]. (Someone from [facility] will contact you concerning start date and time for RN.)    Contact information:   [address] [place] [address] [phone]       Follow up with Bobby Rumpf, MD. Schedule an appointment as soon as possible for a visit in 2 weeks.   Specialty:  Infectious Diseases   Contact information:   [address]  [place] [address] [phone]        Signed: Grier Mitts 11/21/2013, 2:08 PM

## 2013-11-28 LAB — ANAEROBIC CULTURE

## 2013-11-29 ENCOUNTER — Encounter: Payer: Self-pay | Admitting: Nurse Practitioner

## 2013-12-09 ENCOUNTER — Encounter: Payer: Self-pay | Admitting: Infectious Diseases

## 2013-12-10 ENCOUNTER — Telehealth: Payer: Self-pay | Admitting: *Deleted

## 2013-12-10 NOTE — Telephone Encounter (Signed)
Vanc trough = 24.4.  [facility] called pt to "skip" tonight's dose of vanc.  Will draw BUN and creatinine today, not previously drawn.

## 2013-12-10 NOTE — Telephone Encounter (Signed)
Labs received from [place], noticed patient did not have a hospital follow up. Spoke with Lyda Jester and husband did not want to schedule if no changes were to be made. She spoke with Dr. Johnnye Sima and he said that was fine; this office would need to be notified at the end of IV antibiotics so she could be changed to oral. When [facility] calls Dr. Johnnye Sima should be paged regarding oral antibiotics. Myrtis Hopping

## 2013-12-12 ENCOUNTER — Telehealth: Payer: Self-pay | Admitting: *Deleted

## 2013-12-12 NOTE — Telephone Encounter (Signed)
Right arm incision warm and erythematous reported by [facility] RN.  Pt has appt with orthopedist tomorrow.  [facility] appt at [facility] is 12/17/13 w/ Dr Megan Salon.  [facility] RN will call orthopedic MD about incision.

## 2013-12-14 LAB — FUNGUS CULTURE W SMEAR: FUNGAL SMEAR: NONE SEEN

## 2013-12-17 ENCOUNTER — Ambulatory Visit (INDEPENDENT_AMBULATORY_CARE_PROVIDER_SITE_OTHER): Payer: Medicare Other | Admitting: Internal Medicine

## 2013-12-17 ENCOUNTER — Encounter: Payer: Self-pay | Admitting: Internal Medicine

## 2013-12-17 VITALS — BP 142/97 | HR 94 | Temp 98.4°F | Ht 65.0 in | Wt 126.0 lb

## 2013-12-17 DIAGNOSIS — T847XXA Infection and inflammatory reaction due to other internal orthopedic prosthetic devices, implants and grafts, initial encounter: Secondary | ICD-10-CM

## 2013-12-17 MED ORDER — VANCOMYCIN HCL 5000 MG IV SOLR: 1.0000 g | Freq: Every day | INTRAVENOUS | Status: AC

## 2013-12-17 MED ORDER — AMOXICILLIN 500 MG PO TABS: 500.0000 mg | ORAL_TABLET | Freq: Two times a day (BID) | ORAL | Status: DC

## 2013-12-17 NOTE — Progress Notes (Signed)
Patient ID: [name], female   DOB: Sep 13, 1940, 73 y.o.   MRN: [id number]         [facility]  Patient Active Problem List   Diagnosis Date Noted  . Wound infection complicating hardware 41/96/2229  . Hardware complicating wound infection 11/14/2013  . Routine health maintenance 02/05/2013  . PAF (paroxysmal atrial fibrillation) 01/09/2013  . Closed fracture of right proximal humerus 10/30/2012    Class: Acute  . Multiple closed pelvic fractures without disruption of pelvic circle 10/02/2012  . Encounter for therapeutic drug monitoring 07/19/2012  . Personal history of other disorders of nervous system and sense organs 07/19/2012  . Abnormality of gait 07/19/2012  . Hemiplegia, unspecified, affecting dominant side 03/19/2012  . Seizure 03/18/2012  . Larynx cancer 08/18/2011  . Anemia, iron deficiency 05/06/2011  . UC (ulcerative colitis) 01/10/2011  . CARCINOMA, SQUAMOUS CELL, HX OF 10/20/2007  . HYPOTHYROIDISM 06/23/2007  . HYPERLIPIDEMIA 06/23/2007  . HYPERTENSION 06/23/2007  . PERIPHERAL VASCULAR DISEASE 06/23/2007  . URINARY INCONTINENCE 06/23/2007  . ADENOCARCINOMA, BREAST, BILATERAL, HX OF 06/23/2007  . ALCOHOL ABUSE, HX OF 06/23/2007  . CEREBROVASCULAR DISEASE, HX OF 06/23/2007    Patient's Medications  New Prescriptions   AMOXICILLIN (AMOXIL) 500 MG TABLET    Take 1 tablet (500 mg total) by mouth 2 (two) times daily.  Previous Medications   ASPIRIN 81 MG TABLET    Take 81 mg by mouth daily.   CALCIUM CARBONATE-VITAMIN D (CALCIUM 500 + D PO)    Take 1 tablet by mouth daily.   DILTIAZEM (DILACOR XR) 180 MG 24 HR CAPSULE    Take 180 mg by mouth every morning.   FERROUS SULFATE 325 (65 FE) MG EC TABLET    Take 325 mg by mouth 2 (two) times daily.   LEVETIRACETAM (KEPPRA) 750 MG TABLET    Take 2 tablets (1,500 mg total) by mouth 2 (two) times daily.   LEVOTHYROXINE (SYNTHROID, LEVOTHROID) 50 MCG TABLET    Take 50 mcg by mouth every  evening.   LIDOCAINE (XYLOCAINE) 2 % SOLUTION    Take 20 mLs by mouth every 3 (three) hours as needed for mouth pain. Oral pain   MESALAMINE (ASACOL HD) 800 MG TBEC    Take 2 tablets (1,600 mg total) by mouth 3 (three) times daily.   MULTIPLE VITAMIN (MULTIVITAMIN WITH MINERALS) TABS    Take 1 tablet by mouth daily.   OXYCODONE-ACETAMINOPHEN (ROXICET) 5-325 MG PER TABLET    Take 1-2 tablets by mouth every 4 (four) hours as needed for severe pain.   PHENOBARBITAL (LUMINAL) 97.2 MG TABLET    Take 97.2 mg by mouth every morning.   PRAVASTATIN (PRAVACHOL) 40 MG TABLET    Take 40 mg by mouth every evening.   VITAMIN C (ASCORBIC ACID) 500 MG TABLET    Take 500 mg by mouth daily.  Modified Medications   Modified Medication Previous Medication   VANCOMYCIN (VANCOCIN) 5000 MG INJECTION Vancomycin (VANCOCIN) 750 MG/150ML SOLN      Inject 1 g into the vein daily.    Inject 150 mLs (750 mg total) into the vein every 12 (twelve) hours.  Discontinued Medications   CEPHALEXIN (KEFLEX) 500 MG CAPSULE    Take 500 mg by mouth 4 (four) times daily. Started 11/11/13, for 10 days.    Subjective: [name] is in with her husband for hospital followup visit. She underwent open reduction and internal fixation of a right proximal humerus fracture in  May of 2014. Postoperatively she developed a hematoma and a superficial abscess. It responded initially to empiric oral antibiotic therapy then recurred. Enterococcus was reportedly grown from abscess drainage although I do not have copies of that report. She was treated with IV vancomycin and improved.  She recently developed recurrent signs of infection. She started back on oral antibiotic therapy and took 3 doses of an antibiotic she does not recall the name of for her being admitted to the hospital recently for incision and drainage and removal of hardware on 11/14/2013. Dr. Evelena Peat Chandler's operative note indicated "complete collapse of the humeral head". Operative Gram  stain and cultures were negative. She was discharged on IV vancomycin and has now completed 34 days of therapy. She is feeling better with less pain. Last week she developed some increased redness and swelling but that resolved spontaneously. She has not had any problems tolerating her PICC or vancomycin.  Review of Systems: Pertinent items are noted in HPI.  Past Medical History  Diagnosis Date  . Peripheral vascular disease   . Subdural hematoma   . Ataxia   . Mild dysplasia of cervix   . UTI (lower urinary tract infection)   . Urinary incontinence   . Cerebrovascular disease   . Personal history of colonic polyps     adenomatous 1997 & tubular adenoma and hyplastic  2008  . Hyperlipidemia     takes Pravastatin daily  . H/O alcohol abuse   . Diverticulosis of colon (without mention of hemorrhage)   . Redundant colon   . C. difficile colitis   . Dementia t  . Ulcerative colitis     takes [place] daily  . Dysrhythmia     a fib-takes Diltiazem daily  . Seizures     takes Phenobarbital and Keppra daily;last seizure was April 1,2014  . Stroke 2000    off balance on right side a little and peripheral vision loss on right side  . Arthritis   . Joint pain   . Joint swelling   . History of UTI 10-2012  . Hypothyroidism     takes Levothyroxine daily  . Squamous cell carcinoma of mouth   . Adenocarcinoma, breast     bilateral  . Anxiety   . Dementia   . Cancer of larynx   . Hypertension   . Atrial fibrillation   . History of breast cancer   . History of seizures   . Gait disorder   . Oropharyngeal cancer   . History of colitis     History  Substance Use Topics  . Smoking status: Former Smoker -- 39 years    Quit date: 10/26/2004  . Smokeless tobacco: Never Used     Comment: quit in 2007  . Alcohol Use: 4.2 oz/week    7 Glasses of wine per week     Comment: normally 1 glass wine after dinner - occasional    Family History  Problem Relation Age of Onset  . Coronary  artery disease Mother   . Heart disease Mother   . Diabetes Sister   . Breast cancer Maternal Aunt   . Breast cancer Sister     No Known Allergies  Objective: Temp: 98.4 F (36.9 C) (06/16 1423) Temp src: Oral (06/16 1423) BP: 142/97 mmHg (06/16 1423) Pulse Rate: 94 (06/16 1423)  General: She is in no distress and appears in good spirits. Skin: Left arm PICC site appears normal Right shoulder incision is healing nicely without any drainage.  She has some mild residual erythema lateral to the incision but she and her husband indicate that this is markedly improved.  Lab Results Sedimentation rate and C-reactive protein were normal one week ago   Assessment: She is improving on empiric IV vancomycin after her incision and drainage of right shoulder infection and hardware removal. I will have her complete another 8 days of IV vancomycin therapy then have the PICC removed and she will switch to oral amoxicillin.  Plan: 1. Discontinue IV vancomycin and had a PICC removed on June 25 2. Start oral amoxicillin on June 25 3. Followup in 3-4 weeks   Michel Bickers, MD [facility] [place] [phone] pager   [phone] cell 12/17/2013, 2:46 PM

## 2013-12-29 LAB — AFB CULTURE WITH SMEAR (NOT AT ARMC): ACID FAST SMEAR: NONE SEEN

## 2014-01-01 ENCOUNTER — Encounter: Payer: Self-pay | Admitting: Infectious Diseases

## 2014-01-07 ENCOUNTER — Encounter: Payer: Self-pay | Admitting: Infectious Diseases

## 2014-01-09 ENCOUNTER — Telehealth: Payer: Self-pay | Admitting: *Deleted

## 2014-01-09 NOTE — Telephone Encounter (Signed)
Patient husband called to report that since his wife started the Amoxicillian she has been having diarrhea. He advised it is usually after either dose (am/pm) and that she is eating with the medication. He advised that it is not a lot just that it comes so fast and due to her hip surgery she moves slow right now and she is having a bowel movement on herself sometimes. Advised him will let the doctor know and maybe we can get her something that can help. The patient does not want to stop the medication just the diarrhea as she is not having any discomfort just urgency.

## 2014-01-10 ENCOUNTER — Telehealth: Payer: Self-pay | Admitting: Internal Medicine

## 2014-01-10 NOTE — Telephone Encounter (Signed)
Patient has been having a flare of her colitis.  She has had several rounds antibiotics since shoulder surgery.  She is having rectal bleeding and urgency.  She will come in on 01/13/14 4:00.  Appt was scheduled with her husband

## 2014-01-10 NOTE — Telephone Encounter (Signed)
Patient's husband stated that it doesn't appear to be to be like C-Diff. Patient has colitis as well so he is going to call her GI doctor at [facility] and follow with them.

## 2014-01-10 NOTE — Telephone Encounter (Signed)
I would like for her to submit a stool sample for C. difficile PCR before deciding if it is safe to take an antimotility agent such as Imodium.

## 2014-01-13 ENCOUNTER — Other Ambulatory Visit (INDEPENDENT_AMBULATORY_CARE_PROVIDER_SITE_OTHER): Payer: Medicare Other

## 2014-01-13 ENCOUNTER — Ambulatory Visit (INDEPENDENT_AMBULATORY_CARE_PROVIDER_SITE_OTHER): Payer: Medicare Other | Admitting: Internal Medicine

## 2014-01-13 ENCOUNTER — Encounter: Payer: Self-pay | Admitting: Internal Medicine

## 2014-01-13 VITALS — BP 110/60 | HR 74 | Ht 65.0 in | Wt 126.2 lb

## 2014-01-13 DIAGNOSIS — R197 Diarrhea, unspecified: Secondary | ICD-10-CM

## 2014-01-13 DIAGNOSIS — T847XXS Infection and inflammatory reaction due to other internal orthopedic prosthetic devices, implants and grafts, sequela: Secondary | ICD-10-CM

## 2014-01-13 DIAGNOSIS — A0472 Enterocolitis due to Clostridium difficile, not specified as recurrent: Secondary | ICD-10-CM

## 2014-01-13 DIAGNOSIS — D509 Iron deficiency anemia, unspecified: Secondary | ICD-10-CM

## 2014-01-13 DIAGNOSIS — K5190 Ulcerative colitis, unspecified, without complications: Secondary | ICD-10-CM

## 2014-01-13 DIAGNOSIS — B370 Candidal stomatitis: Secondary | ICD-10-CM

## 2014-01-13 DIAGNOSIS — K51919 Ulcerative colitis, unspecified with unspecified complications: Secondary | ICD-10-CM

## 2014-01-13 LAB — CBC WITH DIFFERENTIAL/PLATELET
BASOS ABS: 0 10*3/uL (ref 0.0–0.1)
Basophils Relative: 0.1 % (ref 0.0–3.0)
Eosinophils Absolute: 0.2 10*3/uL (ref 0.0–0.7)
Eosinophils Relative: 3.2 % (ref 0.0–5.0)
HEMATOCRIT: 37.6 % (ref 36.0–46.0)
Hemoglobin: 12.4 g/dL (ref 12.0–15.0)
LYMPHS ABS: 1.7 10*3/uL (ref 0.7–4.0)
Lymphocytes Relative: 21.5 % (ref 12.0–46.0)
MCHC: 32.9 g/dL (ref 30.0–36.0)
MCV: 98.1 fl (ref 78.0–100.0)
MONOS PCT: 8.4 % (ref 3.0–12.0)
Monocytes Absolute: 0.7 10*3/uL (ref 0.1–1.0)
NEUTROS PCT: 66.8 % (ref 43.0–77.0)
Neutro Abs: 5.2 10*3/uL (ref 1.4–7.7)
Platelets: 194 10*3/uL (ref 150.0–400.0)
RBC: 3.83 Mil/uL — ABNORMAL LOW (ref 3.87–5.11)
RDW: 15.6 % — AB (ref 11.5–15.5)
WBC: 7.8 10*3/uL (ref 4.0–10.5)

## 2014-01-13 MED ORDER — MESALAMINE 800 MG PO TBEC: 2.0000 | DELAYED_RELEASE_TABLET | Freq: Three times a day (TID) | ORAL | Status: DC

## 2014-01-13 NOTE — Patient Instructions (Signed)
We will send in your prescriptions to your pharmacy Follow up in 3 months Use a probiotic daily or Activia  Use Imodium daily

## 2014-01-13 NOTE — Progress Notes (Signed)
Subjective:    Patient ID: [name], female    DOB: [birth year], 73 y.o.   MRN: [id number]  HPI [name] is a 73 year old female with a complicated past medical history including ulcerative colitis, history of C. difficile colitis, history of laryngeal cancer status post laryngectomy with radiation, history of hip fracture and shoulder fracture, who is seen in followup. At the time of her last visit on 11/11/2013 she was having significant pain along with redness and swelling in her right upper arm. She had previously been treated for an enterococcal skin infection and was referred urgently to see orthopedic. She ended up needing an operation including removal of the hardware in her right shoulder along with incision and drainage. She took IV vancomycin for 6 weeks and was transitioned to oral amoxicillin with plans to continue therapy for 45 days. She is starting rehabilitation but feels better regarding her right upper extremity.  Regarding her colitis, she had a flexible sigmoidoscopy performed in December 2014 which revealed only mild granular mucosa with minimally active colitis without dysplasia by histology. Endoscopically the mucosa looked much better. She has been taking mesalamine 4.8 g daily in divided doses but this medication has been extremely expensive for her. Her husband is interested in getting this medication from [place] and has done much research. Apparently the medication from [place] it is much cheaper and produced the same manufacturer.  She reports she was doing well from a pulmonary standpoint but developed recurrent diarrhea several days after starting amoxicillin in late June. This has been mildly bloody with mucus. To them it does not seem like C. difficile because it is not as copious nor foul-smelling. She denies fever or chills. Reports extremely good appetite without nausea or vomiting. She has a bad taste in her mouth. No recent fevers. Denies abdominal  pain.  She has been taking oral iron twice daily for the last 2-3 months.   Review of Systems As per history of present illness, otherwise negative  Current Medications, Allergies, Past Medical History, Past Surgical History, Family History and Social History were reviewed in [employer] record.     Objective:   Physical Exam BP 110/60  Pulse 74  Ht 5\' 5"  (1.651 m)  Wt 126 lb 3.2 oz (57.244 kg)  BMI 21.00 kg/m2 Constitutional: Well-developed female in no acute distress  HEENT: Normocephalic and atraumatic. Thrush on the tongue, mild, moist. Status post laryngectomy with tracheostomy  Neck: Neck supple. Stoma in place anteriorly with scarring  Cardiovascular: Normal rate, regular rhythm and intact distal pulses.  Pulmonary/chest: Effort normal and breath sounds normal. Initially right lower lobe rhonchi clearing with coughing  Abdominal: Soft, nontender, thin, nondistended. Bowel sounds active throughout.  Extremities: no clubbing, cyanosis, or edema in the lower extremities Neurological: Alert and oriented to person place and time.  Psychiatric: Normal mood and affect. Behavior is normal.   CBC    Component Value Date/Time   WBC 7.7 11/17/2013 0800   RBC 2.86* 11/17/2013 0800   RBC 2.91* 10/30/2011 0502   HGB 9.2* 11/17/2013 0800   HCT 27.2* 11/17/2013 0800   PLT 215 11/17/2013 0800   MCV 95.1 11/17/2013 0800   MCH 32.2 11/17/2013 0800   MCHC 33.8 11/17/2013 0800   RDW 13.9 11/17/2013 0800   LYMPHSABS 1.2 11/14/2013 0750   MONOABS 0.9 11/14/2013 0750   EOSABS 0.2 11/14/2013 0750   BASOSABS 0.0 11/14/2013 0750    CMP     Component Value Date/Time  NA 135* 11/18/2013 0500   K 3.9 11/18/2013 0500   CL 97 11/18/2013 0500   CO2 29 11/18/2013 0500   GLUCOSE 108* 11/18/2013 0500   BUN 3* 11/18/2013 0500   CREATININE 0.46* 11/18/2013 0500   CALCIUM 8.6 11/18/2013 0500   PROT 6.2 11/18/2013 0500   ALBUMIN 2.7* 11/18/2013 0500   AST 14 11/18/2013 0500   ALT 16  11/18/2013 0500   ALKPHOS 86 11/18/2013 0500   BILITOT 0.3 11/18/2013 0500   GFRNONAA >90 11/18/2013 0500   GFRAA >90 11/18/2013 0500   Iron/TIBC/Ferritin/ %Sat    Component Value Date/Time   IRON 18* 11/11/2013 1038   TIBC Not calculated due to Iron <10. 10/30/2011 0502   FERRITIN 120.3 11/11/2013 1038   IRONPCTSAT 6.3* 11/11/2013 1038       Assessment & Plan:   73 year old female with a complicated past medical history including ulcerative colitis, history of C. difficile colitis, history of laryngeal cancer status post laryngectomy with radiation, history of hip fracture and shoulder fracture, who is seen in followup.   1. UC, hx of C. Diff, currently on ABX in the setting of RUE hardware infection -- she certainly is at risk for recurrent C. difficile colitis, but also could be having a mild flare in her known UC or antibiotic associated diarrhea. It sounds like her bleeding is very minimal. I recommended C. difficile PCR testing. If negative, would continue amoxicillin per ID clinic until completed. Add daily probiotic. Continue mesalamine 1.6 mg 3 times daily. Will write hard RX to help facilitate obtaining this medication from [place]. If PCR for C. difficile is negative she can use Imodium per box obstruction. I am hesitant to start steroids in the setting of her recent serious infection. I'll see her back in 3 months for this issue. If she completes oral antibiotics, C. difficile is negative, and colitis symptoms persist despite max dose mesalamine, would consider Uceris (to avoid systemic steroids)  2.  Thrush -- nystatin swish and spit  3.  IDA -- continue oral iron supplementation, check CBC and iron studies today

## 2014-01-14 ENCOUNTER — Other Ambulatory Visit: Payer: Medicare Other

## 2014-01-14 DIAGNOSIS — K51919 Ulcerative colitis, unspecified with unspecified complications: Secondary | ICD-10-CM

## 2014-01-14 LAB — IBC PANEL
IRON: 61 ug/dL (ref 42–145)
SATURATION RATIOS: 21.5 % (ref 20.0–50.0)
Transferrin: 202.4 mg/dL — ABNORMAL LOW (ref 212.0–360.0)

## 2014-01-14 LAB — IRON: Iron: 61 ug/dL (ref 42–145)

## 2014-01-14 LAB — FERRITIN: FERRITIN: 81.4 ng/mL (ref 10.0–291.0)

## 2014-01-15 LAB — CLOSTRIDIUM DIFFICILE BY PCR: CDIFFPCR: DETECTED — AB

## 2014-01-16 ENCOUNTER — Telehealth: Payer: Self-pay | Admitting: *Deleted

## 2014-01-16 ENCOUNTER — Telehealth: Payer: Self-pay | Admitting: Internal Medicine

## 2014-01-16 ENCOUNTER — Other Ambulatory Visit: Payer: Self-pay

## 2014-01-16 ENCOUNTER — Ambulatory Visit: Payer: Medicare Other | Admitting: Internal Medicine

## 2014-01-16 MED ORDER — SACCHAROMYCES BOULARDII 250 MG PO CAPS: 250.0000 mg | ORAL_CAPSULE | Freq: Two times a day (BID) | ORAL | Status: DC

## 2014-01-16 MED ORDER — VANCOMYCIN 50 MG/ML ORAL SOLUTION: 250.0000 mg | Freq: Four times a day (QID) | ORAL | Status: DC

## 2014-01-16 NOTE — Telephone Encounter (Signed)
Patient husband called and advised she was put on oral Vanc 50 mg 4x daily for 2 weeks for C-Diff and she still takes Amoxicillin 500 mg 2x daily. He is concerned it is to much for her and wanted Dr Megan Salon to advise what to do as he asked Dr Hilarie Fredrickson (the prescribing doctor) and he advised the patient to call our office. He advised she has not taken the Vanc today just the Amoxicillin and wanted to know before she does. He advised she was just D/C on IV vanc recently and changed to oral Amoxicillin. Advised him will page the doctor and give him a call back once I get an answer.

## 2014-01-16 NOTE — Telephone Encounter (Signed)
[name] will contact the physician prescribing the Amoxicillin as discussed.

## 2014-01-17 NOTE — Telephone Encounter (Signed)
Per Dr Megan Salon called the patient to advise to stop the Vanc and schedule an earlier appt and was unable to reach them had to leave a message to call the office asap.

## 2014-01-17 NOTE — Telephone Encounter (Signed)
Patient husband called back and advised him per Dr Megan Salon to D/C the patient Amoxicillian until done with oral Vanc. Also scheduled an appt to see [place] 02/04/14.

## 2014-01-17 NOTE — Telephone Encounter (Signed)
[name] was recently diagnosed with C. difficile colitis complicating her amoxicillin therapy for her shoulder infection. Please let her know this is a very difficult situation. Tell her to continue taking the oral vancomycin but stop the amoxicillin now. Please move her appointment up to the first available appointment with one of our clinic doctors.

## 2014-01-21 ENCOUNTER — Telehealth: Payer: Self-pay | Admitting: Internal Medicine

## 2014-01-21 NOTE — Telephone Encounter (Signed)
Left a message for patient to return my call. 

## 2014-01-21 NOTE — Telephone Encounter (Signed)
Called patient and spoke to husband and he states they have already picked up the prescription for Vancomycin from the pharmacy and is his wife is currently taking it.

## 2014-01-27 ENCOUNTER — Encounter: Payer: Self-pay | Admitting: Infectious Diseases

## 2014-02-03 ENCOUNTER — Ambulatory Visit: Payer: Medicare Other | Admitting: Nurse Practitioner

## 2014-02-04 ENCOUNTER — Encounter: Payer: Self-pay | Admitting: Internal Medicine

## 2014-02-04 ENCOUNTER — Ambulatory Visit (INDEPENDENT_AMBULATORY_CARE_PROVIDER_SITE_OTHER): Payer: Medicare Other | Admitting: Internal Medicine

## 2014-02-04 ENCOUNTER — Ambulatory Visit: Payer: Medicare Other | Admitting: Internal Medicine

## 2014-02-04 VITALS — BP 150/78 | HR 96 | Temp 98.3°F | Wt 125.0 lb

## 2014-02-04 DIAGNOSIS — Z5189 Encounter for other specified aftercare: Secondary | ICD-10-CM

## 2014-02-04 DIAGNOSIS — T847XXA Infection and inflammatory reaction due to other internal orthopedic prosthetic devices, implants and grafts, initial encounter: Secondary | ICD-10-CM

## 2014-02-04 DIAGNOSIS — T847XXD Infection and inflammatory reaction due to other internal orthopedic prosthetic devices, implants and grafts, subsequent encounter: Secondary | ICD-10-CM

## 2014-02-05 NOTE — Assessment & Plan Note (Addendum)
At this time, I am going to have her restart the amoxicillin and see if she can tolerate without return of C diff.  I do not believe she is currently symptomatic with relatively few stools and not watery.  She will continue with the Florastar.  She will call if significant diarrhea returns.  She will return in about 2 weeks  40 minutes spent with 20 minutes of face to face contact in counseling on options.  Reviewed record, op report.

## 2014-02-05 NOTE — Progress Notes (Signed)
   Subjective:    Patient ID: [name], female    DOB: [birth year], 73 y.o.   MRN: [id number]  HPI She comes in with her husband for follow up.  In May 2014 she underwent ORIF of right proximal humerus fracture and developed hematoma and abscess postoperatively.  Initially responded to oral antibiotics after growing Enterococcus changed to IV vancomycin and then in May 2015 had a repeat infection requiring hardware removal by Dr. Tamera Punt on 11/14/2013.  She was treated with IV vancomycin through June 25th and then to take oral amoxicillin.  Several days after starting oral amoxicillin, she developed significant diarrhea initially thought related to her ulcerative colitis but after seeing Dr. Hilarie Fredrickson of GI, noted positive C diff PCR.  She was treated with po vancomycin and stopped amoxicillin.  She has now completed oral vancomcyin and is having less stools, though does have 3-4 stools a day with variable consistency.  She does report some blood but is on the toilet tissue, not stool.  No fever, no chills, arm feels well with much improved pain.     Review of Systems  Constitutional: Negative for fever, chills and fatigue.  Gastrointestinal: Positive for diarrhea.  Skin: Negative for rash.  Neurological: Negative for dizziness.       Objective:   Physical Exam  Constitutional: No distress.  Neck:  Noted scarring from previous trach/surgeries  Cardiovascular: Normal rate, regular rhythm and normal heart sounds.   No murmur heard. Skin: No rash noted.          Assessment & Plan:

## 2014-02-10 ENCOUNTER — Encounter: Payer: Self-pay | Admitting: Internal Medicine

## 2014-02-10 ENCOUNTER — Ambulatory Visit (INDEPENDENT_AMBULATORY_CARE_PROVIDER_SITE_OTHER): Payer: Medicare Other | Admitting: Internal Medicine

## 2014-02-10 ENCOUNTER — Other Ambulatory Visit (INDEPENDENT_AMBULATORY_CARE_PROVIDER_SITE_OTHER): Payer: Medicare Other

## 2014-02-10 ENCOUNTER — Telehealth: Payer: Self-pay | Admitting: Internal Medicine

## 2014-02-10 VITALS — BP 147/84 | HR 100 | Temp 98.2°F | Resp 16 | Ht 65.0 in | Wt 124.0 lb

## 2014-02-10 DIAGNOSIS — E785 Hyperlipidemia, unspecified: Secondary | ICD-10-CM

## 2014-02-10 DIAGNOSIS — R778 Other specified abnormalities of plasma proteins: Secondary | ICD-10-CM

## 2014-02-10 DIAGNOSIS — R32 Unspecified urinary incontinence: Secondary | ICD-10-CM

## 2014-02-10 DIAGNOSIS — R197 Diarrhea, unspecified: Secondary | ICD-10-CM

## 2014-02-10 DIAGNOSIS — E039 Hypothyroidism, unspecified: Secondary | ICD-10-CM

## 2014-02-10 DIAGNOSIS — R7309 Other abnormal glucose: Secondary | ICD-10-CM

## 2014-02-10 DIAGNOSIS — K5190 Ulcerative colitis, unspecified, without complications: Secondary | ICD-10-CM

## 2014-02-10 DIAGNOSIS — Z0000 Encounter for general adult medical examination without abnormal findings: Secondary | ICD-10-CM

## 2014-02-10 DIAGNOSIS — I10 Essential (primary) hypertension: Secondary | ICD-10-CM

## 2014-02-10 DIAGNOSIS — R569 Unspecified convulsions: Secondary | ICD-10-CM

## 2014-02-10 DIAGNOSIS — D509 Iron deficiency anemia, unspecified: Secondary | ICD-10-CM

## 2014-02-10 DIAGNOSIS — R799 Abnormal finding of blood chemistry, unspecified: Secondary | ICD-10-CM

## 2014-02-10 DIAGNOSIS — Z23 Encounter for immunization: Secondary | ICD-10-CM

## 2014-02-10 DIAGNOSIS — R7303 Prediabetes: Secondary | ICD-10-CM | POA: Insufficient documentation

## 2014-02-10 LAB — HEMOGLOBIN A1C: Hgb A1c MFr Bld: 5.7 % (ref 4.6–6.5)

## 2014-02-10 LAB — COMPREHENSIVE METABOLIC PANEL
ALBUMIN: 4.4 g/dL (ref 3.5–5.2)
ALT: 15 U/L (ref 0–35)
AST: 24 U/L (ref 0–37)
Alkaline Phosphatase: 121 U/L — ABNORMAL HIGH (ref 39–117)
BUN: 8 mg/dL (ref 6–23)
CHLORIDE: 97 meq/L (ref 96–112)
CO2: 30 mEq/L (ref 19–32)
CREATININE: 0.6 mg/dL (ref 0.4–1.2)
Calcium: 9.8 mg/dL (ref 8.4–10.5)
GFR: 105.44 mL/min (ref >60.00)
GLUCOSE: 87 mg/dL (ref 70–99)
Potassium: 4.4 mEq/L (ref 3.5–5.1)
Sodium: 134 mEq/L — ABNORMAL LOW (ref 135–145)
TOTAL PROTEIN: 8.5 g/dL — AB (ref 6.0–8.3)
Total Bilirubin: 0.6 mg/dL (ref 0.2–1.2)

## 2014-02-10 LAB — CBC WITH DIFFERENTIAL/PLATELET
BASOS ABS: 0 10*3/uL (ref 0.0–0.1)
Basophils Relative: 0.3 % (ref 0.0–3.0)
Eosinophils Absolute: 0.4 10*3/uL (ref 0.0–0.7)
Eosinophils Relative: 3.4 % (ref 0.0–5.0)
HEMATOCRIT: 40.7 % (ref 36.0–46.0)
Hemoglobin: 14 g/dL (ref 12.0–15.0)
Lymphocytes Relative: 16.9 % (ref 12.0–46.0)
Lymphs Abs: 1.9 10*3/uL (ref 0.7–4.0)
MCHC: 34.4 g/dL (ref 30.0–36.0)
MCV: 96.5 fl (ref 78.0–100.0)
MONO ABS: 0.7 10*3/uL (ref 0.1–1.0)
Monocytes Relative: 6.2 % (ref 3.0–12.0)
NEUTROS PCT: 73.2 % (ref 43.0–77.0)
Neutro Abs: 8.1 10*3/uL — ABNORMAL HIGH (ref 1.4–7.7)
PLATELETS: 203 10*3/uL (ref 150.0–400.0)
RBC: 4.22 Mil/uL (ref 3.87–5.11)
RDW: 14.7 % (ref 11.5–15.5)
WBC: 11.1 10*3/uL — ABNORMAL HIGH (ref 4.0–10.5)

## 2014-02-10 LAB — IBC PANEL
IRON: 98 ug/dL (ref 42–145)
SATURATION RATIOS: 34.3 % (ref 20.0–50.0)
Transferrin: 204.2 mg/dL — ABNORMAL LOW (ref 212.0–360.0)

## 2014-02-10 LAB — LIPID PANEL
CHOL/HDL RATIO: 2
Cholesterol: 165 mg/dL (ref 0–200)
HDL: 76.2 mg/dL (ref >39.00)
LDL Cholesterol: 77 mg/dL (ref 0–99)
NonHDL: 88.8
Triglycerides: 57 mg/dL (ref 0.0–149.0)
VLDL: 11.4 mg/dL (ref 0.0–40.0)

## 2014-02-10 LAB — TSH: TSH: 2.29 u[IU]/mL (ref 0.35–4.50)

## 2014-02-10 LAB — FERRITIN: Ferritin: 102.4 ng/mL (ref 10.0–291.0)

## 2014-02-10 NOTE — Progress Notes (Signed)
Subjective:    Patient ID: [name], female    DOB: 1940/07/20, 73 y.o.   MRN: [id number]  Hypertension This is a chronic problem. The current episode started more than 1 year ago. The problem is unchanged. The problem is controlled. Pertinent negatives include no anxiety, blurred vision, chest pain, headaches, malaise/fatigue, neck pain, orthopnea, palpitations, peripheral edema, PND, shortness of breath or sweats. There are no associated agents to hypertension. Past treatments include calcium channel blockers. The current treatment provides moderate improvement. There are no compliance problems.  Hypertensive end-organ damage includes CVA and heart failure.      Review of Systems  Constitutional: Negative.  Negative for fever, chills, malaise/fatigue, diaphoresis, appetite change and fatigue.  HENT: Negative.   Eyes: Negative.  Negative for blurred vision.  Respiratory: Negative.  Negative for cough, choking, chest tightness, shortness of breath and stridor.   Cardiovascular: Negative.  Negative for chest pain, palpitations, orthopnea, leg swelling and PND.  Gastrointestinal: Negative.  Negative for nausea, vomiting, abdominal pain, diarrhea, constipation and blood in stool.  Endocrine: Negative.   Genitourinary: Negative.   Musculoskeletal: Negative.  Negative for arthralgias, back pain, myalgias, neck pain and neck stiffness.  Skin: Negative.  Negative for rash.  Allergic/Immunologic: Negative.   Neurological: Negative.  Negative for dizziness and headaches.  Hematological: Negative.  Negative for adenopathy. Does not bruise/bleed easily.  Psychiatric/Behavioral: Negative.        Objective:   Physical Exam  Vitals reviewed. Constitutional: She is oriented to person, place, and time. She appears well-developed and well-nourished. No distress.  HENT:  Head: Normocephalic and atraumatic.  Mouth/Throat: Oropharynx is clear and moist. No oropharyngeal exudate.  Eyes:  Conjunctivae are normal. Right eye exhibits no discharge. Left eye exhibits no discharge. No scleral icterus.  Neck: Normal range of motion. Neck supple. No JVD present. No tracheal deviation present. No thyromegaly present.  Cardiovascular: Normal rate and intact distal pulses.  An irregularly irregular rhythm present. Exam reveals no gallop and no friction rub.   Murmur heard.  Decrescendo systolic murmur is present with a grade of 1/6   No diastolic murmur is present  Pulses:      Carotid pulses are 1+ on the right side, and 1+ on the left side.      Radial pulses are 1+ on the right side, and 1+ on the left side.       Femoral pulses are 1+ on the right side, and 1+ on the left side.      Popliteal pulses are 1+ on the right side, and 1+ on the left side.       Dorsalis pedis pulses are 1+ on the right side, and 1+ on the left side.       Posterior tibial pulses are 1+ on the right side, and 1+ on the left side.  Pulmonary/Chest: Effort normal and breath sounds normal. No stridor. No respiratory distress. She has no wheezes. She has no rales. She exhibits no tenderness.  Abdominal: Soft. Bowel sounds are normal. She exhibits no distension and no mass. There is no tenderness. There is no rebound and no guarding.  Musculoskeletal: Normal range of motion. She exhibits no edema and no tenderness.  Lymphadenopathy:    She has no cervical adenopathy.  Neurological: She is oriented to person, place, and time.  Skin: Skin is warm and dry. No rash noted. She is not diaphoretic. No erythema. No pallor.  Psychiatric: She has a normal mood and affect. Her  behavior is normal. Judgment and thought content normal.     Lab Results  Component Value Date   WBC 7.8 01/13/2014   HGB 12.4 01/13/2014   HCT 37.6 01/13/2014   PLT 194.0 01/13/2014   GLUCOSE 108* 11/18/2013   CHOL 162 02/04/2013   TRIG 82.0 02/04/2013   HDL 78.20 02/04/2013   LDLCALC 67 02/04/2013   ALT 16 11/18/2013   AST 14 11/18/2013   NA 135*  11/18/2013   K 3.9 11/18/2013   CL 97 11/18/2013   CREATININE 0.46* 11/18/2013   BUN 3* 11/18/2013   CO2 29 11/18/2013   TSH 1.961 10/03/2012   INR 1.26 03/18/2012   HGBA1C 5.5 05/06/2011       Assessment & Plan:

## 2014-02-10 NOTE — Telephone Encounter (Signed)
Pt has finished the vancomycin, husband states she is still having lots of urgency. Cdiff by PCR ordered and pt will pick up kit today when she sees Dr. Ronnald Ramp for OV. Dr. Hilarie Fredrickson notified.

## 2014-02-10 NOTE — Progress Notes (Signed)
Pre visit review using our clinic review tool, if applicable. No additional management support is needed unless otherwise documented below in the visit note. 

## 2014-02-10 NOTE — Patient Instructions (Signed)
Hypothyroidism The thyroid is a large gland located in the lower front of your neck. The thyroid gland helps control metabolism. Metabolism is how your body handles food. It controls metabolism with the hormone thyroxine. When this gland is underactive (hypothyroid), it produces too little hormone.  CAUSES These include:   Absence or destruction of thyroid tissue.  Goiter due to iodine deficiency.  Goiter due to medications.  Congenital defects (since birth).  Problems with the pituitary. This causes a lack of TSH (thyroid stimulating hormone). This hormone tells the thyroid to turn out more hormone. SYMPTOMS  Lethargy (feeling as though you have no energy)  Cold intolerance  Weight gain (in spite of normal food intake)  Dry skin  Coarse hair  Menstrual irregularity (if severe, may lead to infertility)  Slowing of thought processes Cardiac problems are also caused by insufficient amounts of thyroid hormone. Hypothyroidism in the newborn is cretinism, and is an extreme form. It is important that this form be treated adequately and immediately or it will lead rapidly to retarded physical and mental development. DIAGNOSIS  To prove hypothyroidism, your caregiver may do blood tests and ultrasound tests. Sometimes the signs are hidden. It may be necessary for your caregiver to watch this illness with blood tests either before or after diagnosis and treatment. TREATMENT  Low levels of thyroid hormone are increased by using synthetic thyroid hormone. This is a safe, effective treatment. It usually takes about four weeks to gain the full effects of the medication. After you have the full effect of the medication, it will generally take another four weeks for problems to leave. Your caregiver may start you on low doses. If you have had heart problems the dose may be gradually increased. It is generally not an emergency to get rapidly to normal. HOME CARE INSTRUCTIONS   Take your  medications as your caregiver suggests. Let your caregiver know of any medications you are taking or start taking. Your caregiver will help you with dosage schedules.  As your condition improves, your dosage needs may increase. It will be necessary to have continuing blood tests as suggested by your caregiver.  Report all suspected medication side effects to your caregiver. SEEK MEDICAL CARE IF: Seek medical care if you develop:  Sweating.  Tremulousness (tremors).  Anxiety.  Rapid weight loss.  Heat intolerance.  Emotional swings.  Diarrhea.  Weakness. SEEK IMMEDIATE MEDICAL CARE IF:  You develop chest pain, an irregular heart beat (palpitations), or a rapid heart beat. MAKE SURE YOU:   Understand these instructions.  Will watch your condition.  Will get help right away if you are not doing well or get worse. Document Released: 06/20/2005 Document Revised: 09/12/2011 Document Reviewed: 02/08/2008 ExitCare Patient Information 2015 [employer]. This information is not intended to replace advice given to you by your health care provider. Make sure you discuss any questions you have with your health care provider.  

## 2014-02-11 ENCOUNTER — Encounter: Payer: Self-pay | Admitting: Internal Medicine

## 2014-02-11 DIAGNOSIS — R778 Other specified abnormalities of plasma proteins: Secondary | ICD-10-CM | POA: Insufficient documentation

## 2014-02-11 NOTE — Assessment & Plan Note (Signed)
I have asked her to return for an SPEP to elaborate more on this

## 2014-02-12 ENCOUNTER — Encounter: Payer: Self-pay | Admitting: Internal Medicine

## 2014-02-12 NOTE — Assessment & Plan Note (Addendum)

## 2014-02-12 NOTE — Assessment & Plan Note (Signed)
Her TSH is on the normal range She will stay on the current dose 

## 2014-02-12 NOTE — Assessment & Plan Note (Signed)
She has achieved her LDL goal 

## 2014-02-12 NOTE — Assessment & Plan Note (Signed)
Her BP is adequately well controlled 

## 2014-02-13 ENCOUNTER — Other Ambulatory Visit: Payer: Self-pay

## 2014-02-13 ENCOUNTER — Telehealth: Payer: Self-pay | Admitting: *Deleted

## 2014-02-13 LAB — CLOSTRIDIUM DIFFICILE BY PCR: CDIFFPCR: DETECTED — AB

## 2014-02-13 MED ORDER — FIDAXOMICIN 200 MG PO TABS: 200.0000 mg | ORAL_TABLET | Freq: Two times a day (BID) | ORAL | Status: DC

## 2014-02-13 MED ORDER — VANCOMYCIN HCL 250 MG PO CAPS: 250.0000 mg | ORAL_CAPSULE | Freq: Four times a day (QID) | ORAL | Status: DC

## 2014-02-13 NOTE — Telephone Encounter (Signed)
Pt has C. Diff again per Dr. Hilarie Fredrickson.  Husband's question is "Does Dr. Linus Salmons want [place] to stop oral amoxicillin while she is taking the oral Vancomycin?"   Per verbal order, Dr. Scharlene Gloss, the pt should stop oral amoxicillin while taking the oral vancomycin.  Informed husband of Dr. Henreitta Leber order.  Husband verbalized back the instruction.

## 2014-02-14 ENCOUNTER — Telehealth: Payer: Self-pay | Admitting: Internal Medicine

## 2014-02-14 NOTE — Telephone Encounter (Signed)
Pt stated he came in 02/10/14 and he ask our office to send in refill for her med but drug store does not have anything. Pt request Diltiazem, Levothyroxin, Keppra, Phenobarbital and Pravastatin to be send [facility] [address] and also send to [facility].

## 2014-02-17 ENCOUNTER — Other Ambulatory Visit: Payer: Self-pay

## 2014-02-17 ENCOUNTER — Ambulatory Visit: Payer: Medicare Other | Admitting: Internal Medicine

## 2014-02-17 MED ORDER — LEVOTHYROXINE SODIUM 50 MCG PO TABS: 50.0000 ug | ORAL_TABLET | Freq: Every evening | ORAL | Status: DC

## 2014-02-17 MED ORDER — PRAVASTATIN SODIUM 40 MG PO TABS
40.0000 mg | ORAL_TABLET | Freq: Every evening | ORAL | Status: DC
Start: 2014-02-17 — End: 2014-06-16

## 2014-02-17 MED ORDER — PHENOBARBITAL 97.2 MG PO TABS: 97.2000 mg | ORAL_TABLET | Freq: Every morning | ORAL | Status: DC

## 2014-02-17 MED ORDER — DILTIAZEM HCL ER 180 MG PO CP24: 180.0000 mg | ORAL_CAPSULE | Freq: Every morning | ORAL | Status: DC

## 2014-02-19 ENCOUNTER — Ambulatory Visit (INDEPENDENT_AMBULATORY_CARE_PROVIDER_SITE_OTHER): Payer: Medicare Other | Admitting: Nurse Practitioner

## 2014-02-19 ENCOUNTER — Encounter: Payer: Self-pay | Admitting: Nurse Practitioner

## 2014-02-19 VITALS — BP 116/67 | HR 94 | Temp 98.1°F | Ht 65.5 in | Wt 121.8 lb

## 2014-02-19 DIAGNOSIS — R569 Unspecified convulsions: Secondary | ICD-10-CM

## 2014-02-19 NOTE — Patient Instructions (Signed)
Continue Keppra at current dose does not need refills Continue phenobarbital at current dose does not need refills Sodium levels are stable Followup in 6 to 8 months

## 2014-02-19 NOTE — Progress Notes (Signed)
[facility]  PATIENT: [name] DOB: 11-12-1940   REASON FOR VISIT: Followup for seizure disorder and history are intercranial hemorrhage.   HISTORY OF PRESENT ILLNESS:[name] is a 73 year old left-handed white female with a history of an intracranial hemorrhage and subsequent seizures. She was last seen in this office by [name] 08/05/2013. She continues to have problems with infection most recently C diff.  The patient is on PO antibiotics currently. The patient has not had any seizures since April of 2014. The patient remains on Keppra and phenobarbital, and she is tolerating the medication well. The patient runs chronic hyponatremia, but the recent sodium levels have been near normal at 134 on 02/10/14.  The patient denies any other significant medical issues since last seen. The patient returns for an evaluation. Her seizure medication were  just refilled by her primary care physician.    REVIEW OF SYSTEMS: Full 14 system review of systems performed and notable only for those listed, all others are neg:  Constitutional: N/A  Cardiovascular: N/A  Ear/Nose/Throat: N/A  Skin: N/A  Eyes: N/A  Respiratory: N/A  Gastroitestinal: Diarrhea,  rectal bleeding Hematology/Lymphatic: N/A  Endocrine: N/A Musculoskeletal:N/A  Allergy/Immunology: N/A  Neurological: History of seizure Psychiatric: N/A Sleep : NA   ALLERGIES: No Known Allergies  HOME MEDICATIONS: Outpatient Prescriptions Prior to Visit  Medication Sig Dispense Refill  . amoxicillin (AMOXIL) 500 MG tablet Take 1 tablet (500 mg total) by mouth 2 (two) times daily.  90 tablet  1  . aspirin 81 MG tablet Take 81 mg by mouth daily.      . Calcium Carbonate-Vitamin D (CALCIUM 500 + D PO) Take 1 tablet by mouth daily.      Marland Kitchen diltiazem (DILACOR XR) 180 MG 24 hr capsule Take 1 capsule (180 mg total) by mouth every morning.  90 capsule  3  . ferrous sulfate 325 (65 FE) MG EC tablet Take 325 mg by  mouth 2 (two) times daily.      . fidaxomicin (DIFICID) 200 MG TABS tablet Take 1 tablet (200 mg total) by mouth 2 (two) times daily.  20 tablet  0  . levETIRAcetam (KEPPRA) 750 MG tablet Take 2 tablets (1,500 mg total) by mouth 2 (two) times daily.  120 tablet  0  . levothyroxine (SYNTHROID, LEVOTHROID) 50 MCG tablet Take 1 tablet (50 mcg total) by mouth every evening.  90 tablet  3  . lidocaine (XYLOCAINE) 2 % solution Take 20 mLs by mouth every 3 (three) hours as needed for mouth pain. Oral pain      . Mesalamine (ASACOL HD) 800 MG TBEC Take 2 tablets (1,600 mg total) by mouth 3 (three) times daily.  144 tablet  6  . Multiple Vitamin (MULTIVITAMIN WITH MINERALS) TABS Take 1 tablet by mouth daily.      Marland Kitchen PHENobarbital (LUMINAL) 97.2 MG tablet Take 1 tablet (97.2 mg total) by mouth every morning.  90 tablet  3  . pravastatin (PRAVACHOL) 40 MG tablet Take 1 tablet (40 mg total) by mouth every evening.  90 tablet  3  . saccharomyces boulardii (FLORASTOR) 250 MG capsule Take 1 capsule (250 mg total) by mouth 2 (two) times daily.  60 capsule  0  . vancomycin (VANCOCIN) 250 MG capsule Take 1 capsule (250 mg total) by mouth 4 (four) times daily.  56 capsule  0  . vitamin C (ASCORBIC ACID) 500 MG tablet Take 500 mg by mouth daily.  No facility-administered medications prior to visit.    PAST MEDICAL HISTORY: Past Medical History  Diagnosis Date  . Peripheral vascular disease   . Subdural hematoma   . Ataxia   . Mild dysplasia of cervix   . UTI (lower urinary tract infection)   . Urinary incontinence   . Cerebrovascular disease   . Personal history of colonic polyps     adenomatous 1997 & tubular adenoma and hyplastic  2008  . Hyperlipidemia     takes Pravastatin daily  . H/O alcohol abuse   . Diverticulosis of colon (without mention of hemorrhage)   . Redundant colon   . C. difficile colitis   . Dementia t  . Ulcerative colitis     takes [place] daily  . Dysrhythmia     a fib-takes  Diltiazem daily  . Seizures     takes Phenobarbital and Keppra daily;last seizure was April 1,2014  . Stroke 2000    off balance on right side a little and peripheral vision loss on right side  . Arthritis   . Joint pain   . Joint swelling   . History of UTI 10-2012  . Hypothyroidism     takes Levothyroxine daily  . Squamous cell carcinoma of mouth   . Adenocarcinoma, breast     bilateral  . Anxiety   . Dementia   . Cancer of larynx   . Hypertension   . Atrial fibrillation   . History of breast cancer   . History of seizures   . Gait disorder   . Oropharyngeal cancer   . History of colitis     PAST SURGICAL HISTORY: Past Surgical History  Procedure Laterality Date  . Subdural hematoma evacuation via craniotomy    . Oral surgery for squamous cell carcinoma of the mouth      x 3  . Multiple tooth extractions      due to oral cancer  . Cataract extraction, bilateral    . Breast lumpectomy      left breast with radiation therapy  . Carotid endarterectomy      left  . Mastectomy      Right, history with nodule dissection  . Orif hip fracture  Sept '12    Right hip: screw and plate repair.  . Larynx surgery  08/2010    [facility] x 2  . Tubal ligation    . Colonoscopy    . Orif humerus fracture Right 10/30/2012    Procedure: OPEN REDUCTION INTERNAL FIXATION (ORIF) HUMERAL SHAFT FRACTURE;  Surgeon: [name] Dibble, MD;  Location: [place];  Service: Orthopedics;  Laterality: Right;  BIG C-ARM, SHOULDER POSITION  . Hammer toe surgery Right 03/2013    pins and screws  . Flexible sigmoidoscopy N/A 06/21/2013    Procedure: FLEXIBLE SIGMOIDOSCOPY;  Surgeon: [name] Bears, MD;  Location: [facility];  Service: Gastroenterology;  Laterality: N/A;  . Hardware removal Right 11/14/2013    Procedure: RIGHT SHOUDER HARDWARE REMOVAL;  Surgeon: [name] Sells, MD;  Location: [place];  Service: Orthopedics;  Laterality: Right;  . Incision and drainage Right 11/14/2013    Procedure:  INCISION AND DRAINAGE;  Surgeon: [name] Sells, MD;  Location: [place];  Service: Orthopedics;  Laterality: Right;  . Shoulder surgery  10/2013    FAMILY HISTORY: Family History  Problem Relation Age of Onset  . Coronary artery disease Mother   . Heart disease Mother   . Diabetes Sister   . Breast cancer Maternal  4   . Breast cancer Sister   . Colon cancer      nephew (sister's son)    SOCIAL HISTORY: History   Social History  . Marital Status: Married    Spouse Name: Doug    Number of Children: 2  . Years of Education: 12   Occupational History  . retired     Art therapist   Social History Main Topics  . Smoking status: Former Smoker -- 15 years    Quit date: 10/26/2004  . Smokeless tobacco: Never Used     Comment: quit in 16-Oct-2005  . Alcohol Use: 4.2 oz/week    7 Glasses of wine per week     Comment: normally 1 glass wine after dinner - occasional  . Drug Use: No  . Sexual Activity: No   Other Topics Concern  . Not on file   Social History Narrative   HSG. Married x 7 years divorced; married October 17, 2067. 1 son- died as a neonate, 1 son- 2058/10/17. Retired- worked as a Art therapist.   End of life: has a living will- does not want futile/ heroic care; no cpr.     Marriage- reports marriage is in good health (4/10)                 PHYSICAL EXAM  Filed Vitals:   02/19/14 1522  BP: 116/67  Pulse: 94  Temp: 98.1 F (36.7 C)  TempSrc: Oral  Height: 5' 5.5" (1.664 m)  Weight: 121 lb 12.8 oz (55.248 kg)   Body mass index is 19.95 kg/(m^2). General: The patient is alert and cooperative at the time of the examination.  Skin: No significant peripheral edema is noted.  Neurologic Exam  Mental status: The patient is oriented x 3.  Cranial nerves: Facial symmetry is present. Speech is generated with an electrolarynx. Extraocular movements are full. Visual fields are notable for a right homonymous visual field deficit.  Motor: The patient has good strength in  all 4 extremities.  Sensory examination: Soft touch sensation is symmetric on the face, arms, and legs.  Coordination: The patient has good finger-nose-finger and heel-to-shin bilaterally.  Gait and station: The patient has a slightly wide-based gait. Tandem gait is unsteady. Romberg is negative. No drift is seen.  Reflexes: Deep tendon reflexes are symmetric.    DIAGNOSTIC DATA (LABS, IMAGING, TESTING) - I reviewed patient records, labs, notes, testing and imaging myself where available.  Lab Results  Component Value Date   WBC 11.1* 02/10/2014   HGB 14.0 02/10/2014   HCT 40.7 02/10/2014   MCV 96.5 02/10/2014   PLT 203.0 02/10/2014      Component Value Date/Time   NA 134* 02/10/2014 1439   K 4.4 02/10/2014 1439   CL 97 02/10/2014 1439   CO2 30 02/10/2014 1439   GLUCOSE 87 02/10/2014 1439   BUN 8 02/10/2014 1439   CREATININE 0.6 02/10/2014 1439   CALCIUM 9.8 02/10/2014 1439   PROT 8.5* 02/10/2014 1439   ALBUMIN 4.4 02/10/2014 1439   AST 24 02/10/2014 1439   ALT 15 02/10/2014 1439   ALKPHOS 121* 02/10/2014 1439   BILITOT 0.6 02/10/2014 1439   GFRNONAA >90 11/18/2013 0500   GFRAA >90 11/18/2013 0500   Lab Results  Component Value Date   CHOL 165 02/10/2014   HDL 76.20 02/10/2014   LDLCALC 77 02/10/2014   TRIG 57.0 02/10/2014   CHOLHDL 2 02/10/2014   Lab Results  Component Value Date   HGBA1C 5.7 02/10/2014  Lab Results  Component Value Date   TSH 2.29 02/10/2014      ASSESSMENT AND PLAN  73 y.o. year old female  has a past medical history of seizure disorder with last seizure being April 2014. Also history of intercranial hemorrhage and chronic hyponatremia which is stable.  Continue Keppra at current dose does not need refills Continue phenobarbital at current dose does not need refills Followup in 6 to 8 months These [name], [facility], [facility], [facility] [address], [place], [place] [address] [phone]

## 2014-02-19 NOTE — Progress Notes (Signed)
I have read the note, and I agree with the clinical assessment and plan.  Crisanto Nied KEITH   

## 2014-02-20 ENCOUNTER — Ambulatory Visit: Payer: Medicare Other | Admitting: Internal Medicine

## 2014-02-26 ENCOUNTER — Other Ambulatory Visit: Payer: Self-pay

## 2014-02-26 ENCOUNTER — Telehealth: Payer: Self-pay | Admitting: Internal Medicine

## 2014-02-26 DIAGNOSIS — R197 Diarrhea, unspecified: Secondary | ICD-10-CM

## 2014-02-26 NOTE — Telephone Encounter (Signed)
Ok to repeat c diff PCR

## 2014-02-26 NOTE — Telephone Encounter (Signed)
Pts husband states that his wife should finish taking the last dose of vancomycin tomorrow night. States she is still having loose stools. Would like to know if they should come in and have her tested to see if cdiff is clear prior to OV 03/05/14. Please advise.

## 2014-02-28 ENCOUNTER — Encounter: Payer: Self-pay | Admitting: Infectious Diseases

## 2014-02-28 ENCOUNTER — Other Ambulatory Visit: Payer: Medicare Other

## 2014-02-28 DIAGNOSIS — R197 Diarrhea, unspecified: Secondary | ICD-10-CM

## 2014-03-02 LAB — CLOSTRIDIUM DIFFICILE BY PCR: Toxigenic C. Difficile by PCR: NOT DETECTED

## 2014-03-05 ENCOUNTER — Ambulatory Visit (INDEPENDENT_AMBULATORY_CARE_PROVIDER_SITE_OTHER): Payer: Medicare Other | Admitting: Internal Medicine

## 2014-03-05 ENCOUNTER — Encounter: Payer: Self-pay | Admitting: Internal Medicine

## 2014-03-05 VITALS — BP 90/60 | HR 80 | Ht 65.0 in | Wt 121.0 lb

## 2014-03-05 DIAGNOSIS — R778 Other specified abnormalities of plasma proteins: Secondary | ICD-10-CM

## 2014-03-05 DIAGNOSIS — R197 Diarrhea, unspecified: Secondary | ICD-10-CM

## 2014-03-05 DIAGNOSIS — A0472 Enterocolitis due to Clostridium difficile, not specified as recurrent: Secondary | ICD-10-CM

## 2014-03-05 DIAGNOSIS — R799 Abnormal finding of blood chemistry, unspecified: Secondary | ICD-10-CM

## 2014-03-05 DIAGNOSIS — K5190 Ulcerative colitis, unspecified, without complications: Secondary | ICD-10-CM

## 2014-03-05 MED ORDER — MESALAMINE 800 MG PO TBEC: 2.0000 | DELAYED_RELEASE_TABLET | Freq: Three times a day (TID) | ORAL | Status: DC

## 2014-03-05 NOTE — Patient Instructions (Signed)
Go to the basement for labs today We have printed a 90 day supply prescription for you to mail Continue Florastor daily Restart Metamucil daily

## 2014-03-05 NOTE — Progress Notes (Signed)
Subjective:    Patient ID: [name], female    DOB: 08-21-1940, 73 y.o.   MRN: [id number]  HPI [name] is a 73 year old female without a past medical history including ulcerative colitis, history of C. 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 She was last seen on 01/13/2014. Shortly after this office visit she was diagnosed with C. difficile on 01/14/2014 and was treated with vancomycin. Symptoms recurred and stool was again positive for C. difficile on 02/11/2014. She was treated with a prolonged course of vancomycin and finished therapy 6 days ago. Her husband submitted stool for C. difficile PCR the day after she finished antibiotics.  She is here today with her husband. Stools have definitely improved and decreased in frequency though she is still going 4-6 times daily. Stools can be urgent and remained loose but not as watery or as foul-smelling as before. She continues to have a good appetite without nausea or vomiting. Stools have been nonbloody and non-melenic. She has continued on Florastor twice daily.  No fevers or chills. Fortunately no recent antibiotics though she has been on these several months back with her shoulder infection. She has continued Asacol HD 4.8 g daily (three divided doses which she is getting significantly cheaper from a pharmacy in [place]).  She is not on PPI. She also continues on iron supplement   Review of Systems As per history of present illness, otherwise negative  Current Medications, Allergies, Past Medical History, Past Surgical History, Family History and Social History were reviewed in [employer] record.     Objective:   Physical Exam BP 90/60  Pulse 80  Ht 5\' 5"  (1.651 m)  Wt 121 lb (54.885 kg)  BMI 20.14  kg/m2 Constitutional: Well-developed, No distress. HEENT: Normocephalic and atraumatic. Tongue is moist, status post laryngectomy with tracheostomy covered, no icterus Neck: Anterior stroma with scarring Cardiovascular: Normal rate, regular rhythm and intact distal pulses. Pulmonary/chest: Effort normal and breath sounds normal. No wheezing, rales or rhonchi. Abdominal: Soft, nontender, nondistended. Bowel sounds active throughout.  Extremities: no clubbing, cyanosis, or edema Lymphadenopathy: No cervical adenopathy noted. Neurological: Alert and oriented to person place and time. Psychiatric: Normal mood and affect. Behavior is normal.  C. difficile PCR positive 01/14/2014, 02/11/2014 C. difficile PCR negative 02/28/2014      Assessment & Plan:   73 year old female without a past medical history including ulcerative colitis, history of C. 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   1. C. difficile colitis/history of ulcerative colitis -- hopefully C. difficile has resolved at this point. I'm going to repeat one additional C. difficile PCR to ensure that infection has resolved. I want her to continue Florastor twice daily. She will continue Asacol HD 1.6 g 3 times daily. I would like her to admit Metamucil to help bulk her stools which has worked in the past. If C. difficile is negative, she can add loperamide once to twice daily to help decrease stool frequency. I do not get the sense that she is having a severe ulcerative colitis flare and therefore I'm avoiding colonic release budesonide given her recent infection problems. --Await stool for C.  difficile PCR and return in 3 months, sooner if worsening.

## 2014-03-07 ENCOUNTER — Other Ambulatory Visit: Payer: Medicare Other

## 2014-03-07 DIAGNOSIS — R197 Diarrhea, unspecified: Secondary | ICD-10-CM

## 2014-03-09 LAB — CLOSTRIDIUM DIFFICILE BY PCR: Toxigenic C. Difficile by PCR: NOT DETECTED

## 2014-03-13 ENCOUNTER — Other Ambulatory Visit: Payer: Self-pay

## 2014-03-13 MED ORDER — LEVETIRACETAM 750 MG PO TABS: 1500.0000 mg | ORAL_TABLET | Freq: Two times a day (BID) | ORAL | Status: DC

## 2014-03-14 ENCOUNTER — Other Ambulatory Visit: Payer: Self-pay

## 2014-03-14 MED ORDER — PHENOBARBITAL 97.2 MG PO TABS: 97.2000 mg | ORAL_TABLET | Freq: Every morning | ORAL | Status: DC

## 2014-03-14 MED ORDER — LEVETIRACETAM 750 MG PO TABS: 1500.0000 mg | ORAL_TABLET | Freq: Two times a day (BID) | ORAL | Status: DC

## 2014-03-14 NOTE — Telephone Encounter (Signed)
Rx signed and faxed.

## 2014-03-18 ENCOUNTER — Ambulatory Visit (INDEPENDENT_AMBULATORY_CARE_PROVIDER_SITE_OTHER): Payer: Medicare Other

## 2014-03-18 DIAGNOSIS — Z23 Encounter for immunization: Secondary | ICD-10-CM

## 2014-03-21 ENCOUNTER — Telehealth: Payer: Self-pay | Admitting: Internal Medicine

## 2014-03-21 ENCOUNTER — Encounter: Payer: Self-pay | Admitting: Gastroenterology

## 2014-03-21 NOTE — Telephone Encounter (Signed)
Per OV note from 03/05/14 pt was to continue taking Florastor BID. Pts husband wants to know how long she needs to stay on this, his supply is getting low. Please advise.

## 2014-03-21 NOTE — Telephone Encounter (Signed)
Spoke with [name] and he is aware.

## 2014-03-21 NOTE — Telephone Encounter (Signed)
Okay to stop when current supply runs out Restart in the setting of any future antibiotics

## 2014-03-26 ENCOUNTER — Ambulatory Visit (INDEPENDENT_AMBULATORY_CARE_PROVIDER_SITE_OTHER): Payer: Medicare Other | Admitting: Internal Medicine

## 2014-03-26 ENCOUNTER — Encounter: Payer: Self-pay | Admitting: Infectious Diseases

## 2014-03-26 ENCOUNTER — Encounter: Payer: Self-pay | Admitting: Internal Medicine

## 2014-03-26 VITALS — BP 139/78 | HR 61 | Temp 98.2°F | Wt 122.0 lb

## 2014-03-26 DIAGNOSIS — T847XXD Infection and inflammatory reaction due to other internal orthopedic prosthetic devices, implants and grafts, subsequent encounter: Secondary | ICD-10-CM

## 2014-03-26 DIAGNOSIS — Z5189 Encounter for other specified aftercare: Secondary | ICD-10-CM

## 2014-03-26 DIAGNOSIS — T847XXA Infection and inflammatory reaction due to other internal orthopedic prosthetic devices, implants and grafts, initial encounter: Secondary | ICD-10-CM

## 2014-03-26 NOTE — Progress Notes (Signed)
   Subjective:    Patient ID: [name], female    DOB: Dec 21, 1940, 73 y.o.   MRN: [id number]  HPI She comes in with her husband for follow up.  In May 2014 she underwent ORIF of right proximal humerus fracture and developed hematoma and abscess postoperatively.  Initially responded to oral antibiotics after growing Enterococcus changed to IV vancomycin and then in May 2015 had a repeat infection requiring hardware removal by Dr. Tamera Punt on 11/14/2013.  She was treated with IV vancomycin through June 25th and then to take oral amoxicillin.  Several days after starting oral amoxicillin, she developed significant diarrhea initially thought related to her ulcerative colitis but after seeing Dr. Hilarie Fredrickson of GI, noted positive C diff PCR.  She was treated with po vancomycin and stopped amoxicillin.  She then completed oral vancomcyin.  I had her restart the amoxicillin but she quickly again developed C diff positive diarreha, not felt to be due to UC.  She was seen by Dr. Hilarie Fredrickson and treated for C diff.  Now resolved.     Review of Systems  Constitutional: Negative for fever, chills and fatigue.  Gastrointestinal: Negative for diarrhea.  Musculoskeletal: Negative for arthralgias.  Skin: Negative for rash.  Neurological: Negative for dizziness.       Objective:   Physical Exam  Constitutional: No distress.  Neck:  Noted scarring from previous trach/surgeries  Cardiovascular: Normal rate, regular rhythm and normal heart sounds.   No murmur heard. Skin: No rash noted.          Assessment & Plan:

## 2014-03-26 NOTE — Assessment & Plan Note (Addendum)
At this point, with no issues from shoulder from my exam, recurrent C diff, I would not continue with any further antibiotics for her shoulder Enterococcus.  She is going to see Dr. Tamera Punt again but tells me she seems to have pretty sufficient function of her shoulder for bathing, ADLs and is ok with it how it is.  Return PRN.

## 2014-04-14 ENCOUNTER — Ambulatory Visit (INDEPENDENT_AMBULATORY_CARE_PROVIDER_SITE_OTHER): Payer: Medicare Other | Admitting: Infectious Diseases

## 2014-04-14 ENCOUNTER — Encounter: Payer: Self-pay | Admitting: Infectious Diseases

## 2014-04-14 VITALS — BP 102/64 | HR 108 | Temp 98.2°F | Wt 119.0 lb

## 2014-04-14 DIAGNOSIS — T847XXD Infection and inflammatory reaction due to other internal orthopedic prosthetic devices, implants and grafts, subsequent encounter: Secondary | ICD-10-CM

## 2014-04-14 NOTE — Assessment & Plan Note (Addendum)
She will complete her anbx on 10-14. She appears to be tolerating this course of anbx without too much difficulty.  I would like to r/o that she has DVT in her UE.  Will see her back in 2 weeks. i have asked her to call back in the intervening period if she has any worsening.

## 2014-04-14 NOTE — Progress Notes (Signed)
   Subjective:    Patient ID: [name], female    DOB: 1941-05-14, 73 y.o.   MRN: [id number]  HPI 73 yo F who in May 2014 underwent ORIF of right proximal humerus fracture and developed hematoma and abscess postoperatively. Initially responded to oral antibiotics after growing Enterococcus, and then in May 2015 had a repeat infection requiring hardware removal by Dr. Tamera Punt on 11/14/2013. She was treated with IV vancomycin through June 25th and then oral amoxicillin. Several days after starting oral amoxicillin, she developed significant diarrhea and was positive C diff PCR. She was treated with po vancomycin and stopped amoxicillin. She completed this and was again challenged with po amoxil (8-4) and again developed C diff. She was seen in f/u 9-23 and was not started back on anbx.   Today she feels like her shoulder is better. Has no pain. Was seen by ortho last week and had heat in her arm, erythema to elbow. She was started on po anbx/Augmentin ~ 2 weeks ago  ([facility] [address] Has 4 tabs left.  Has had significant loose BM since then. Has been seen by her GI doctor, felt to have UC flare. No fever or chills. Has had at least 4 loose bm /day. Had C diff ~ 9-4. Since then loose BM has been about the same. She still has urgency.    Review of Systems  Constitutional: Negative for fever and chills.  Gastrointestinal: Positive for diarrhea. Negative for constipation.       Objective:   Physical Exam  Constitutional: She appears well-developed and well-nourished.  HENT:  Mouth/Throat: No oropharyngeal exudate.  Cardiovascular: Normal rate, regular rhythm and normal heart sounds.   Pulmonary/Chest: Effort normal and breath sounds normal.  Abdominal: Soft. Bowel sounds are normal. She exhibits no distension. There is no tenderness.  Musculoskeletal:       Arms:         Assessment & Plan:

## 2014-04-15 ENCOUNTER — Ambulatory Visit: Payer: Medicare Other | Admitting: Infectious Diseases

## 2014-04-16 ENCOUNTER — Ambulatory Visit (HOSPITAL_COMMUNITY)
Admission: RE | Admit: 2014-04-16 | Discharge: 2014-04-16 | Disposition: A | Discharge status: Home / Self Care | Payer: Medicare Other | Source: Ambulatory Visit | Attending: Infectious Diseases | Admitting: Infectious Diseases

## 2014-04-16 ENCOUNTER — Ambulatory Visit: Payer: Medicare Other | Admitting: Infectious Diseases

## 2014-04-16 DIAGNOSIS — M79609 Pain in unspecified limb: Secondary | ICD-10-CM | POA: Insufficient documentation

## 2014-04-16 DIAGNOSIS — M7989 Other specified soft tissue disorders: Secondary | ICD-10-CM

## 2014-04-16 DIAGNOSIS — T847XXD Infection and inflammatory reaction due to other internal orthopedic prosthetic devices, implants and grafts, subsequent encounter: Secondary | ICD-10-CM

## 2014-04-16 NOTE — Progress Notes (Signed)
*  PRELIMINARY RESULTS* Vascular Ultrasound Right upper extremity venous duplex has been completed.  Preliminary findings: no evidence of deep or superficial thrombosis.  Called results to Dr. Johnnye Sima.    Landry Mellow, RDMS, RVT  04/16/2014, 3:04 PM

## 2014-05-05 ENCOUNTER — Telehealth: Payer: Self-pay | Admitting: Internal Medicine

## 2014-05-05 ENCOUNTER — Ambulatory Visit: Payer: Medicare Other | Admitting: Physician Assistant

## 2014-05-05 NOTE — Telephone Encounter (Signed)
Caller name: douglas Relation to pt: husband Call back number:[phone]   Reason for call:  Having a lot of "urgency" situations and can barley make it to the bathroom.  Wants to know what they should do; should they come in the office?  Having some blood in stool occasionally.

## 2014-05-05 NOTE — Telephone Encounter (Signed)
Spoke with pts husband. Pt is having frequent stools with urgency, states that she does not make it to the bathroom on time. Pt has had a little blood in the stool but husband reports this is nothing new. Offered pt an appt for this afternoon. Husband states they would rather wait and see Dr. Hilarie Fredrickson, let him know there are no appts with the doctor until late December/January. Husband states they are being seen by ID this week and he will speak with the doctor there. He knows to call us back if he needs to get her scheduled with [facility]level.

## 2014-05-07 ENCOUNTER — Other Ambulatory Visit: Payer: Self-pay | Admitting: *Deleted

## 2014-05-07 ENCOUNTER — Encounter: Payer: Self-pay | Admitting: Infectious Diseases

## 2014-05-07 ENCOUNTER — Ambulatory Visit (INDEPENDENT_AMBULATORY_CARE_PROVIDER_SITE_OTHER): Payer: Medicare Other | Admitting: Infectious Diseases

## 2014-05-07 VITALS — BP 143/81 | HR 112 | Temp 97.9°F | Wt 120.0 lb

## 2014-05-07 DIAGNOSIS — A0472 Enterocolitis due to Clostridium difficile, not specified as recurrent: Secondary | ICD-10-CM | POA: Insufficient documentation

## 2014-05-07 DIAGNOSIS — T847XXD Infection and inflammatory reaction due to other internal orthopedic prosthetic devices, implants and grafts, subsequent encounter: Secondary | ICD-10-CM

## 2014-05-07 DIAGNOSIS — A047 Enterocolitis due to Clostridium difficile: Secondary | ICD-10-CM

## 2014-05-07 MED ORDER — VANCOMYCIN 50 MG/ML ORAL SOLUTION: 125.0000 mg | Freq: Four times a day (QID) | ORAL | Status: DC

## 2014-05-07 NOTE — Assessment & Plan Note (Signed)
Will give her prolonged oral vanco taper.  It is not clear to me that this is an issue with her UC, recurrent C diff or both. She will let us know if she is not improved. She will f/u with GI as well.

## 2014-05-07 NOTE — Assessment & Plan Note (Signed)
She appears to be doing well. Has ortho f/u.

## 2014-05-07 NOTE — Progress Notes (Signed)
   Subjective:    Patient ID: [name], female    DOB: [birth year], 73 y.o.   MRN: [id number]  HPI 73 yo F who in May 2014 underwent ORIF of right proximal humerus fracture and developed hematoma and abscess postoperatively. Initially responded to oral antibiotics after growing Enterococcus, and then in May 2015 had a repeat infection requiring hardware removal by Dr. Tamera Punt on 11/14/2013. She was treated with IV vancomycin through June 25th and then oral amoxicillin. Several days after starting oral amoxicillin, she developed significant diarrhea and was positive C diff PCR. She was treated with po vancomycin and stopped amoxicillin. She completed this and was again challenged with po amoxil (8-4) and again developed C diff. She was seen in f/u 9-23 and was not started back on anbx.   She was started on po anbx/Augmentin and significant loose BM. Has been seen by her GI doctor, felt to have UC flare. No fever or chills. Has had at least 4 loose bm /day. Had C diff ~ 9-4.  She returns today with worsening loose BM. States she is going 10-15x/day. Sometimes is not able to make it to the bathroom. No f/c. Shoulder is doing well (no heat, no erythema, still has limited motion in her shoulder).  Has ortho f/u in 2-3 weeks.  Will have GI f/u in January.  Wt has been steady, been drinking ensure. Has been able to eat and as soon as she does she has loose BM. Has loose BM even when not eating.   Review of Systems  Constitutional: Negative for fever and chills.  Gastrointestinal: Positive for diarrhea.       Objective:   Physical Exam  Constitutional: She appears well-developed and well-nourished.  HENT:  Mouth/Throat: Oropharynx is clear and moist. No oropharyngeal exudate.  Eyes: EOM are normal. Pupils are equal, round, and reactive to light.  Cardiovascular: Normal rate, regular rhythm and normal heart sounds.   Pulmonary/Chest: Effort normal and breath sounds normal.  Abdominal: Soft.  Bowel sounds are normal. She exhibits no distension. There is no tenderness.  Musculoskeletal:       Arms:         Assessment & Plan:

## 2014-05-09 LAB — CLOSTRIDIUM DIFFICILE BY PCR: Toxigenic C. Difficile by PCR: DETECTED — CR

## 2014-05-12 ENCOUNTER — Telehealth: Payer: Self-pay | Admitting: *Deleted

## 2014-05-12 NOTE — Telephone Encounter (Signed)
[facility] called to report patient has a + CDiff from labs done 05/08/14. Advised will inform the doctor and find out what he wants to do for the patient.

## 2014-05-13 NOTE — Telephone Encounter (Signed)
Spoke with patient's husband.  She is on the vancomycin taper, started it 11/4.  Feels it is a little less frequent, but still having urgency and having difficulty making it to the bathroom. "is somewhat better" per husband.

## 2014-05-13 NOTE — Telephone Encounter (Signed)
Per my previous note, she is on prolonged vanco taper.  Please confirm

## 2014-05-13 NOTE — Telephone Encounter (Signed)
Patient would like to know if it would be a good idea to be on probiotic (florastor) while taking oral vancomycin? Please advise.

## 2014-06-02 ENCOUNTER — Telehealth: Payer: Self-pay | Admitting: *Deleted

## 2014-06-02 DIAGNOSIS — R197 Diarrhea, unspecified: Secondary | ICD-10-CM

## 2014-06-02 NOTE — Telephone Encounter (Signed)
Please resume vanco at 125mg  qid for 2 weeks, 125mg  po bid for 1 week, 125mg  po qday for 1 week, 125mg  qod for 3 weeks.

## 2014-06-02 NOTE — Telephone Encounter (Signed)
Patient's husband called, stating she is in the middle of her vancomycin taper (started 11/4; now taking 1/2 tsp daily through Wednesday 12/2, then down to 1/2 tsp three times a week for 2 weeks).  She has had an increase in urgency to stool, has had "runny pudding" consistency stool 10-15 times a day, has had accidents in her clothes, and occasional bright red blood on the toilet paper since Thanksgiving.  She is seeing her primary care physician on Friday 12/4.  Patient will call Dr. Vena Rua office, too.  Per Dr. Johnnye Sima, C.diff PCR order placed; patient's husband will pick up today, should bring back to lab 12/1.    Please advise on the vancomycin taper. Landis Gandy, RN

## 2014-06-03 ENCOUNTER — Telehealth: Payer: Self-pay | Admitting: Internal Medicine

## 2014-06-03 MED ORDER — BUDESONIDE 9 MG PO TB24: 9.0000 mg | ORAL_TABLET | Freq: Every day | ORAL | Status: DC

## 2014-06-03 NOTE — Telephone Encounter (Signed)
Verbal order called into [facility].  Instructions given to patient's husband.  He will bring sample this morning when patient wakes up, will restart taper at noon.

## 2014-06-03 NOTE — Telephone Encounter (Signed)
Pts husband states he is "at wits end." States they have been dealing with cdiff off and on for over 6 months. Pt started having diarrhea again yesterday and they called ID and she was started back on Vancomycin. He states pt is having a lot of urgency, she can't take but a couple of steps and the stool comes out. States there is also blood in the stool today. He is trying to collect stool specimen to return to ID but wants to know what else Dr. Hilarie Fredrickson might recommend. Please advise.

## 2014-06-03 NOTE — Telephone Encounter (Signed)
Spoke with pts husband and he is aware, script sent to the pharmacy.

## 2014-06-03 NOTE — Telephone Encounter (Signed)
Very well could be having a flare of UC as well as recurrent c diff. Stool study recently ordered, would also recommend Uceris 9 mg daily x 8 weeks for UC And may need to consider fecal transplant for recurrent c diff.

## 2014-06-04 ENCOUNTER — Other Ambulatory Visit: Payer: Medicare Other

## 2014-06-04 ENCOUNTER — Telehealth: Payer: Self-pay

## 2014-06-04 DIAGNOSIS — R197 Diarrhea, unspecified: Secondary | ICD-10-CM

## 2014-06-04 NOTE — Telephone Encounter (Signed)
yes

## 2014-06-04 NOTE — Telephone Encounter (Signed)
Pt's husband aware...

## 2014-06-04 NOTE — Telephone Encounter (Signed)
Pts husband called the pharmacy and wants to know if his wife is supposed to continue taking the Asacol along with the Uceris. Please advise.

## 2014-06-05 ENCOUNTER — Encounter (HOSPITAL_COMMUNITY): Payer: Self-pay | Admitting: Emergency Medicine

## 2014-06-05 ENCOUNTER — Inpatient Hospital Stay (HOSPITAL_COMMUNITY)
Admission: EM | Admit: 2014-06-05 | Discharge: 2014-06-09 | DRG: 871 | Disposition: A | Discharge status: Home / Self Care | Payer: Medicare Other | Attending: Internal Medicine | Admitting: Internal Medicine

## 2014-06-05 ENCOUNTER — Ambulatory Visit: Payer: Medicare Other | Admitting: Internal Medicine

## 2014-06-05 ENCOUNTER — Telehealth: Payer: Self-pay | Admitting: *Deleted

## 2014-06-05 DIAGNOSIS — E785 Hyperlipidemia, unspecified: Secondary | ICD-10-CM | POA: Diagnosis present

## 2014-06-05 DIAGNOSIS — Z8249 Family history of ischemic heart disease and other diseases of the circulatory system: Secondary | ICD-10-CM

## 2014-06-05 DIAGNOSIS — I10 Essential (primary) hypertension: Secondary | ICD-10-CM | POA: Diagnosis present

## 2014-06-05 DIAGNOSIS — I739 Peripheral vascular disease, unspecified: Secondary | ICD-10-CM | POA: Diagnosis present

## 2014-06-05 DIAGNOSIS — G40909 Epilepsy, unspecified, not intractable, without status epilepticus: Secondary | ICD-10-CM | POA: Diagnosis present

## 2014-06-05 DIAGNOSIS — Z833 Family history of diabetes mellitus: Secondary | ICD-10-CM

## 2014-06-05 DIAGNOSIS — A047 Enterocolitis due to Clostridium difficile: Secondary | ICD-10-CM | POA: Diagnosis present

## 2014-06-05 DIAGNOSIS — D491 Neoplasm of unspecified behavior of respiratory system: Secondary | ICD-10-CM | POA: Diagnosis present

## 2014-06-05 DIAGNOSIS — E039 Hypothyroidism, unspecified: Secondary | ICD-10-CM | POA: Diagnosis present

## 2014-06-05 DIAGNOSIS — F419 Anxiety disorder, unspecified: Secondary | ICD-10-CM | POA: Diagnosis present

## 2014-06-05 DIAGNOSIS — E876 Hypokalemia: Secondary | ICD-10-CM | POA: Diagnosis present

## 2014-06-05 DIAGNOSIS — A419 Sepsis, unspecified organism: Secondary | ICD-10-CM | POA: Diagnosis not present

## 2014-06-05 DIAGNOSIS — F0390 Unspecified dementia without behavioral disturbance: Secondary | ICD-10-CM | POA: Diagnosis present

## 2014-06-05 DIAGNOSIS — D630 Anemia in neoplastic disease: Secondary | ICD-10-CM | POA: Diagnosis present

## 2014-06-05 DIAGNOSIS — R569 Unspecified convulsions: Secondary | ICD-10-CM | POA: Diagnosis present

## 2014-06-05 DIAGNOSIS — K5190 Ulcerative colitis, unspecified, without complications: Secondary | ICD-10-CM | POA: Diagnosis present

## 2014-06-05 DIAGNOSIS — D72829 Elevated white blood cell count, unspecified: Secondary | ICD-10-CM | POA: Diagnosis present

## 2014-06-05 DIAGNOSIS — K5730 Diverticulosis of large intestine without perforation or abscess without bleeding: Secondary | ICD-10-CM | POA: Diagnosis present

## 2014-06-05 DIAGNOSIS — Z803 Family history of malignant neoplasm of breast: Secondary | ICD-10-CM | POA: Diagnosis not present

## 2014-06-05 DIAGNOSIS — D509 Iron deficiency anemia, unspecified: Secondary | ICD-10-CM | POA: Diagnosis present

## 2014-06-05 DIAGNOSIS — L03116 Cellulitis of left lower limb: Secondary | ICD-10-CM | POA: Diagnosis present

## 2014-06-05 DIAGNOSIS — Z681 Body mass index (BMI) 19 or less, adult: Secondary | ICD-10-CM | POA: Diagnosis not present

## 2014-06-05 DIAGNOSIS — E43 Unspecified severe protein-calorie malnutrition: Secondary | ICD-10-CM | POA: Insufficient documentation

## 2014-06-05 DIAGNOSIS — L0390 Cellulitis, unspecified: Secondary | ICD-10-CM | POA: Diagnosis present

## 2014-06-05 DIAGNOSIS — Z8673 Personal history of transient ischemic attack (TIA), and cerebral infarction without residual deficits: Secondary | ICD-10-CM | POA: Diagnosis not present

## 2014-06-05 DIAGNOSIS — Z7982 Long term (current) use of aspirin: Secondary | ICD-10-CM

## 2014-06-05 DIAGNOSIS — R269 Unspecified abnormalities of gait and mobility: Secondary | ICD-10-CM | POA: Diagnosis present

## 2014-06-05 DIAGNOSIS — M1990 Unspecified osteoarthritis, unspecified site: Secondary | ICD-10-CM | POA: Diagnosis present

## 2014-06-05 DIAGNOSIS — Z9011 Acquired absence of right breast and nipple: Secondary | ICD-10-CM | POA: Diagnosis present

## 2014-06-05 DIAGNOSIS — I482 Chronic atrial fibrillation, unspecified: Secondary | ICD-10-CM | POA: Diagnosis present

## 2014-06-05 DIAGNOSIS — A0472 Enterocolitis due to Clostridium difficile, not specified as recurrent: Secondary | ICD-10-CM | POA: Diagnosis present

## 2014-06-05 DIAGNOSIS — I480 Paroxysmal atrial fibrillation: Secondary | ICD-10-CM | POA: Diagnosis present

## 2014-06-05 DIAGNOSIS — Z87891 Personal history of nicotine dependence: Secondary | ICD-10-CM | POA: Diagnosis not present

## 2014-06-05 LAB — CBC WITH DIFFERENTIAL/PLATELET
BASOS ABS: 0 10*3/uL (ref 0.0–0.1)
Basophils Relative: 0 % (ref 0–1)
EOS PCT: 0 % (ref 0–5)
Eosinophils Absolute: 0 10*3/uL (ref 0.0–0.7)
HCT: 34.8 % — ABNORMAL LOW (ref 36.0–46.0)
Hemoglobin: 11.4 g/dL — ABNORMAL LOW (ref 12.0–15.0)
LYMPHS ABS: 1.4 10*3/uL (ref 0.7–4.0)
Lymphocytes Relative: 10 % — ABNORMAL LOW (ref 12–46)
MCH: 31.7 pg (ref 26.0–34.0)
MCHC: 32.8 g/dL (ref 30.0–36.0)
MCV: 96.7 fL (ref 78.0–100.0)
Monocytes Absolute: 1 10*3/uL (ref 0.1–1.0)
Monocytes Relative: 8 % (ref 3–12)
Neutro Abs: 10.7 10*3/uL — ABNORMAL HIGH (ref 1.7–7.7)
Neutrophils Relative %: 82 % — ABNORMAL HIGH (ref 43–77)
PLATELETS: 187 10*3/uL (ref 150–400)
RBC: 3.6 MIL/uL — ABNORMAL LOW (ref 3.87–5.11)
RDW: 15.1 % (ref 11.5–15.5)
WBC: 13.1 10*3/uL — ABNORMAL HIGH (ref 4.0–10.5)

## 2014-06-05 LAB — BASIC METABOLIC PANEL
Anion gap: 14 (ref 5–15)
BUN: 8 mg/dL (ref 6–23)
CALCIUM: 9.2 mg/dL (ref 8.4–10.5)
CO2: 24 mEq/L (ref 19–32)
Chloride: 94 mEq/L — ABNORMAL LOW (ref 96–112)
Creatinine, Ser: 0.56 mg/dL (ref 0.50–1.10)
GFR, EST NON AFRICAN AMERICAN: 89 mL/min — AB (ref >90)
GLUCOSE: 105 mg/dL — AB (ref 70–99)
Potassium: 3.8 mEq/L (ref 3.7–5.3)
Sodium: 132 mEq/L — ABNORMAL LOW (ref 137–147)

## 2014-06-05 LAB — MAGNESIUM: Magnesium: 2.2 mg/dL (ref 1.5–2.5)

## 2014-06-05 LAB — PHOSPHORUS: PHOSPHORUS: 3.8 mg/dL (ref 2.3–4.6)

## 2014-06-05 LAB — CLOSTRIDIUM DIFFICILE BY PCR: Toxigenic C. Difficile by PCR: NOT DETECTED

## 2014-06-05 LAB — TSH: TSH: 1.28 u[IU]/mL (ref 0.350–4.500)

## 2014-06-05 MED ORDER — ASPIRIN EC 81 MG PO TBEC
81.0000 mg | DELAYED_RELEASE_TABLET | Freq: Every day | ORAL | Status: DC
  Administered 2014-06-05 – 2014-06-09 (×5): 81 mg via ORAL
  Filled 2014-06-05 (×5): qty 1

## 2014-06-05 MED ORDER — DILTIAZEM HCL ER 180 MG PO CP24
180.0000 mg | ORAL_CAPSULE | Freq: Every morning | ORAL | Status: DC
  Administered 2014-06-06 – 2014-06-09 (×4): 180 mg via ORAL
  Filled 2014-06-05 (×4): qty 1

## 2014-06-05 MED ORDER — LEVETIRACETAM 750 MG PO TABS
1500.0000 mg | ORAL_TABLET | Freq: Two times a day (BID) | ORAL | Status: DC
  Administered 2014-06-05 – 2014-06-09 (×8): 1500 mg via ORAL
  Filled 2014-06-05 (×9): qty 2

## 2014-06-05 MED ORDER — BUDESONIDE 3 MG PO CP24
9.0000 mg | ORAL_CAPSULE | Freq: Every day | ORAL | Status: DC
  Administered 2014-06-06 – 2014-06-09 (×4): 9 mg via ORAL
  Filled 2014-06-05 (×4): qty 3

## 2014-06-05 MED ORDER — SODIUM CHLORIDE 0.9 % IV SOLN
INTRAVENOUS | Status: DC
  Administered 2014-06-05: 19:00:00 via INTRAVENOUS

## 2014-06-05 MED ORDER — VANCOMYCIN HCL IN DEXTROSE 1-5 GM/200ML-% IV SOLN: 1000.0000 mg | Freq: Two times a day (BID) | INTRAVENOUS | Status: DC

## 2014-06-05 MED ORDER — ONDANSETRON HCL 4 MG/2ML IJ SOLN: 4.0000 mg | Freq: Four times a day (QID) | INTRAMUSCULAR | Status: DC | PRN

## 2014-06-05 MED ORDER — PIPERACILLIN-TAZOBACTAM 3.375 G IVPB
3.3750 g | Freq: Once | INTRAVENOUS | Status: AC
  Administered 2014-06-05: 3.375 g via INTRAVENOUS
  Filled 2014-06-05: qty 50

## 2014-06-05 MED ORDER — OXYCODONE-ACETAMINOPHEN 5-325 MG PO TABS
1.0000 | ORAL_TABLET | Freq: Once | ORAL | Status: AC
  Administered 2014-06-05: 1 via ORAL
  Filled 2014-06-05: qty 1

## 2014-06-05 MED ORDER — VANCOMYCIN HCL IN DEXTROSE 1-5 GM/200ML-% IV SOLN
1000.0000 mg | INTRAVENOUS | Status: DC
  Administered 2014-06-06 – 2014-06-08 (×3): 1000 mg via INTRAVENOUS
  Filled 2014-06-05 (×3): qty 200

## 2014-06-05 MED ORDER — ONDANSETRON HCL 4 MG PO TABS: 4.0000 mg | ORAL_TABLET | Freq: Four times a day (QID) | ORAL | Status: DC | PRN

## 2014-06-05 MED ORDER — PIPERACILLIN-TAZOBACTAM 3.375 G IVPB 30 MIN: 3.3750 g | Freq: Three times a day (TID) | INTRAVENOUS | Status: DC

## 2014-06-05 MED ORDER — SACCHAROMYCES BOULARDII 250 MG PO CAPS
250.0000 mg | ORAL_CAPSULE | Freq: Two times a day (BID) | ORAL | Status: DC
  Administered 2014-06-05 – 2014-06-09 (×8): 250 mg via ORAL
  Filled 2014-06-05 (×9): qty 1

## 2014-06-05 MED ORDER — MESALAMINE 400 MG PO CPDR
800.0000 mg | DELAYED_RELEASE_CAPSULE | Freq: Three times a day (TID) | ORAL | Status: DC
  Administered 2014-06-05 – 2014-06-09 (×11): 800 mg via ORAL
  Filled 2014-06-05 (×14): qty 2

## 2014-06-05 MED ORDER — ACETAMINOPHEN 650 MG RE SUPP: 650.0000 mg | Freq: Four times a day (QID) | RECTAL | Status: DC | PRN

## 2014-06-05 MED ORDER — PHENOBARBITAL 32.4 MG PO TABS
97.2000 mg | ORAL_TABLET | Freq: Every morning | ORAL | Status: DC
  Administered 2014-06-06 – 2014-06-09 (×4): 97.2 mg via ORAL
  Filled 2014-06-05 (×4): qty 3

## 2014-06-05 MED ORDER — PIPERACILLIN-TAZOBACTAM 3.375 G IVPB
3.3750 g | Freq: Three times a day (TID) | INTRAVENOUS | Status: DC
  Administered 2014-06-05 – 2014-06-08 (×8): 3.375 g via INTRAVENOUS
  Filled 2014-06-05 (×10): qty 50

## 2014-06-05 MED ORDER — PRAVASTATIN SODIUM 40 MG PO TABS
40.0000 mg | ORAL_TABLET | Freq: Every evening | ORAL | Status: DC
  Administered 2014-06-05 – 2014-06-08 (×4): 40 mg via ORAL
  Filled 2014-06-05 (×5): qty 1

## 2014-06-05 MED ORDER — VANCOMYCIN 50 MG/ML ORAL SOLUTION
125.0000 mg | Freq: Once | ORAL | Status: AC
  Administered 2014-06-05: 125 mg via ORAL
  Filled 2014-06-05: qty 2.5

## 2014-06-05 MED ORDER — VANCOMYCIN HCL IN DEXTROSE 1-5 GM/200ML-% IV SOLN
1000.0000 mg | Freq: Once | INTRAVENOUS | Status: AC
  Administered 2014-06-05: 1000 mg via INTRAVENOUS
  Filled 2014-06-05: qty 200

## 2014-06-05 MED ORDER — VANCOMYCIN 50 MG/ML ORAL SOLUTION
125.0000 mg | Freq: Four times a day (QID) | ORAL | Status: DC
Start: 2014-06-05 — End: 2014-06-09
  Administered 2014-06-05 – 2014-06-09 (×16): 125 mg via ORAL
  Filled 2014-06-05 (×19): qty 2.5

## 2014-06-05 MED ORDER — OXYCODONE HCL 5 MG PO TABS: 5.0000 mg | ORAL_TABLET | Freq: Four times a day (QID) | ORAL | Status: DC | PRN

## 2014-06-05 MED ORDER — LEVOTHYROXINE SODIUM 50 MCG PO TABS
50.0000 ug | ORAL_TABLET | Freq: Every evening | ORAL | Status: DC
  Administered 2014-06-05 – 2014-06-08 (×4): 50 ug via ORAL
  Filled 2014-06-05 (×5): qty 1

## 2014-06-05 MED ORDER — ACETAMINOPHEN 325 MG PO TABS
650.0000 mg | ORAL_TABLET | Freq: Four times a day (QID) | ORAL | Status: DC | PRN
  Administered 2014-06-06: 650 mg via ORAL
  Filled 2014-06-05: qty 2

## 2014-06-05 MED ORDER — ADULT MULTIVITAMIN W/MINERALS CH
1.0000 | ORAL_TABLET | Freq: Every day | ORAL | Status: DC
  Administered 2014-06-06 – 2014-06-09 (×4): 1 via ORAL
  Filled 2014-06-05 (×4): qty 1

## 2014-06-05 NOTE — Progress Notes (Signed)
ANTIBIOTIC CONSULT NOTE - INITIAL  Pharmacy Consult for vancomycin, Zosyn Indication: cellulitis  No Known Allergies  Patient Measurements: Weight: 119 lb 14.9 oz (54.4 kg)  Vital Signs: Temp: 98.9 F (37.2 C) (12/03 1650) Temp Source: Oral (12/03 1316) BP: 121/55 mmHg (12/03 1650) Pulse Rate: 95 (12/03 1650) Intake/Output from previous day:   Intake/Output from this shift:    Labs:  Recent Labs  06/05/14 1106  WBC 13.1*  HGB 11.4*  PLT 187  CREATININE 0.56   Estimated Creatinine Clearance: 52.2 mL/min (by C-G formula based on Cr of 0.56). No results for input(s): VANCOTROUGH, VANCOPEAK, VANCORANDOM, GENTTROUGH, GENTPEAK, GENTRANDOM, TOBRATROUGH, TOBRAPEAK, TOBRARND, AMIKACINPEAK, AMIKACINTROU, AMIKACIN in the last 72 hours.   Microbiology: Recent Results (from the past 720 hour(s))  Clostridium Difficile by PCR     Status: Abnormal   Collection Time: 05/08/14  3:08 PM  Result Value Ref Range Status   C difficile by pcr Detected (AA) Not Detected Final    Comment: This test is for use only with liquid or soft stools; performance characteristics of other clinical specimen types have not been established.   This assay was performed by Cepheid GeneXpert(R) PCR. The performance characteristics of this assay have been determined by [employer]. Performance characteristics refer to the analytical performance of the test.   Clostridium Difficile by PCR     Status: None   Collection Time: 06/04/14  3:17 PM  Result Value Ref Range Status   C difficile by pcr Not Detected Not Detected Final    Comment: This test is for use only with liquid or soft stools; performance characteristics of other clinical specimen types have not been established.   This assay was performed by Cepheid GeneXpert(R) PCR. The performance characteristics of this assay have been determined by [employer]. Performance characteristics refer to the analytical performance of the  test.     Medical History: Past Medical History  Diagnosis Date  . Peripheral vascular disease   . Subdural hematoma   . Ataxia   . Mild dysplasia of cervix   . UTI (lower urinary tract infection)   . Urinary incontinence   . Cerebrovascular disease   . Personal history of colonic polyps     adenomatous 1997 & tubular adenoma and hyplastic  2008  . Hyperlipidemia     takes Pravastatin daily  . H/O alcohol abuse   . Diverticulosis of colon (without mention of hemorrhage)   . Redundant colon   . C. difficile colitis   . Dementia t  . Ulcerative colitis     takes [place] daily  . Dysrhythmia     a fib-takes Diltiazem daily  . Seizures     takes Phenobarbital and Keppra daily;last seizure was April 1,2014  . Stroke 2000    off balance on right side a little and peripheral vision loss on right side  . Arthritis   . Joint pain   . Joint swelling   . History of UTI 10-2012  . Hypothyroidism     takes Levothyroxine daily  . Squamous cell carcinoma of mouth   . Adenocarcinoma, breast     bilateral  . Anxiety   . Dementia   . Cancer of larynx   . Hypertension   . Atrial fibrillation   . History of breast cancer   . History of seizures   . Gait disorder   . Oropharyngeal cancer   . History of colitis     Medications:  Scheduled:  .  aspirin  81 mg Oral Daily  . Budesonide  9 mg Oral Daily  . [START ON 06/06/2014] diltiazem  180 mg Oral q morning - 10a  . levETIRAcetam  1,500 mg Oral BID  . levothyroxine  50 mcg Oral QPM  . Mesalamine  2 tablet Oral TID  . multivitamin with minerals  1 tablet Oral Daily  . [START ON 06/06/2014] PHENobarbital  97.2 mg Oral q morning - 10a  . piperacillin-tazobactam (ZOSYN)  IV  3.375 g Intravenous Once  . pravastatin  40 mg Oral QPM  . saccharomyces boulardii  250 mg Oral BID  . vancomycin  125 mg Oral 4 times per day   Infusions:  . sodium chloride     Assessment: 73 yo female presented to ER with left ankle joint swelling and  left foot pain that have progressed over last 2 days. Rash also on left leg that has been progressing. To start vancomycin and Zosyn per pharmacy for treatment of cellulitis.  12/3 >> vancomycin >> 12/3 >> Zosyn >>    Afebrile  WBC 13.1  Scr 0.56 with est CrCl of 52 ml/min  Goal of Therapy:  Vancomycin trough level 10-15 mcg/ml  Plan:  1) Vanc 1g given at 1242. Start vancomycin 1g IV q24 thereafter beginning tomorrow 2) Zosyn 3.375g IV given at 1416. Start Zosyn 3.375g IV q8 (extended interval infusion) thereafter later this evening   Adrian Saran, PharmD, BCPS Pager [phone] 06/05/2014 6:10 PM

## 2014-06-05 NOTE — ED Provider Notes (Signed)
CSN: [id number]     Arrival date & time 06/05/14  3570 History   First MD Initiated Contact with Patient 06/05/14 1007     Chief Complaint  Patient presents with  . Joint Swelling    left ankle  . Foot Pain    left     Patient is a 73 y.o. female presenting with lower extremity pain. The history is provided by the patient and the spouse.  Foot Pain   [name] presents for evaluation of left posterior ankle pain and swelling. Her symptoms started about 2 days ago and had become progressively worse over the last 24 hours. She denies any injury to the ankle or prior similar symptoms. She states that the back of her ankle and just above the ankle is where most of her pain is concentrated. She's noticed a rash on the left leg that's been there since last night and is progressing. She denies fevers or flulike symptoms. She denies any new chest pain, shortness of breath, vomiting. She takes a baby aspirin daily. She has no history of DVT PE. She is not diabetic. She is currently undergoing treatment for C. Difficile. Symptoms are moderate and constant. She can no longer bear weight to the left leg.  Past Medical History  Diagnosis Date  . Peripheral vascular disease   . Subdural hematoma   . Ataxia   . Mild dysplasia of cervix   . UTI (lower urinary tract infection)   . Urinary incontinence   . Cerebrovascular disease   . Personal history of colonic polyps     adenomatous 1997 & tubular adenoma and hyplastic  2008  . Hyperlipidemia     takes Pravastatin daily  . H/O alcohol abuse   . Diverticulosis of colon (without mention of hemorrhage)   . Redundant colon   . C. difficile colitis   . Dementia t  . Ulcerative colitis     takes [place] daily  . Dysrhythmia     a fib-takes Diltiazem daily  . Seizures     takes Phenobarbital and Keppra daily;last seizure was April 1,2014  . Stroke 2000    off balance on right side a little and peripheral vision loss on right side  . Arthritis   .  Joint pain   . Joint swelling   . History of UTI 10-2012  . Hypothyroidism     takes Levothyroxine daily  . Squamous cell carcinoma of mouth   . Adenocarcinoma, breast     bilateral  . Anxiety   . Dementia   . Cancer of larynx   . Hypertension   . Atrial fibrillation   . History of breast cancer   . History of seizures   . Gait disorder   . Oropharyngeal cancer   . History of colitis    Past Surgical History  Procedure Laterality Date  . Subdural hematoma evacuation via craniotomy    . Oral surgery for squamous cell carcinoma of the mouth      x 3  . Multiple tooth extractions      due to oral cancer  . Cataract extraction, bilateral    . Breast lumpectomy      left breast with radiation therapy  . Carotid endarterectomy      left  . Mastectomy      Right, history with nodule dissection  . Orif hip fracture  Sept '12    Right hip: screw and plate repair.  . Larynx surgery  08/2010  [facility] x 2  . Tubal ligation    . Colonoscopy    . Orif humerus fracture Right 10/30/2012    Procedure: OPEN REDUCTION INTERNAL FIXATION (ORIF) HUMERAL SHAFT FRACTURE;  Surgeon: Hessie Dibble, MD;  Location: [place];  Service: Orthopedics;  Laterality: Right;  BIG C-ARM, SHOULDER POSITION  . Hammer toe surgery Right 03/2013    pins and screws  . Flexible sigmoidoscopy N/A 06/21/2013    Procedure: FLEXIBLE SIGMOIDOSCOPY;  Surgeon: Jerene Bears, MD;  Location: [facility];  Service: Gastroenterology;  Laterality: N/A;  . Hardware removal Right 11/14/2013    Procedure: RIGHT SHOUDER HARDWARE REMOVAL;  Surgeon: Nita Sells, MD;  Location: [place];  Service: Orthopedics;  Laterality: Right;  . Incision and drainage Right 11/14/2013    Procedure: INCISION AND DRAINAGE;  Surgeon: Nita Sells, MD;  Location: [place];  Service: Orthopedics;  Laterality: Right;  . Shoulder surgery  10/2013   Family History  Problem Relation Age of Onset  . Coronary artery disease Mother   .  Heart disease Mother   . Diabetes Sister   . Breast cancer Maternal Aunt   . Breast cancer Sister   . Colon cancer      nephew (sister's son)   History  Substance Use Topics  . Smoking status: Former Smoker -- 28 years    Quit date: 10/26/2004  . Smokeless tobacco: Never Used     Comment: quit in 2007  . Alcohol Use: 4.2 oz/week    7 Glasses of wine per week     Comment: normally 1 glass wine after dinner - occasional   OB History    No data available     Review of Systems  All other systems reviewed and are negative.     Allergies  Review of patient's allergies indicates no known allergies.  Home Medications   Prior to Admission medications   Medication Sig Start Date End Date Taking? Authorizing Provider  aspirin 81 MG tablet Take 81 mg by mouth daily.    Historical Provider, MD  Budesonide 9 MG TB24 Take 9 mg by mouth daily. 06/03/14   Jerene Bears, MD  Calcium Carbonate-Vitamin D (CALCIUM 500 + D PO) Take 1 tablet by mouth daily.    Historical Provider, MD  diltiazem (DILACOR XR) 180 MG 24 hr capsule Take 1 capsule (180 mg total) by mouth every morning. 02/17/14   Janith Lima, MD  ferrous sulfate 325 (65 FE) MG EC tablet Take 325 mg by mouth 2 (two) times daily.    Historical Provider, MD  levETIRAcetam (KEPPRA) 750 MG tablet Take 2 tablets (1,500 mg total) by mouth 2 (two) times daily. 03/14/14   Janith Lima, MD  levothyroxine (SYNTHROID, LEVOTHROID) 50 MCG tablet Take 1 tablet (50 mcg total) by mouth every evening. 02/17/14   Janith Lima, MD  lidocaine (XYLOCAINE) 2 % solution Take 20 mLs by mouth every 3 (three) hours as needed for mouth pain. Oral pain    Historical Provider, MD  Mesalamine (ASACOL HD) 800 MG TBEC Take 2 tablets (1,600 mg total) by mouth 3 (three) times daily. 03/05/14   Jerene Bears, MD  Multiple Vitamin (MULTIVITAMIN WITH MINERALS) TABS Take 1 tablet by mouth daily.    Historical Provider, MD  PHENobarbital (LUMINAL) 97.2 MG tablet Take 1  tablet (97.2 mg total) by mouth every morning. 03/14/14   Kathrynn Ducking, MD  pravastatin (PRAVACHOL) 40 MG tablet Take 1 tablet (40  mg total) by mouth every evening. 02/17/14   Janith Lima, MD  saccharomyces boulardii (FLORASTOR) 250 MG capsule Take 1 capsule (250 mg total) by mouth 2 (two) times daily. 01/16/14   Jerene Bears, MD  vancomycin (VANCOCIN) 50 mg/mL oral solution Take 2.5 mLs (125 mg total) by mouth every 6 (six) hours. 125mg  qid for 14 days, 125mg  bid for 7 days, 125mg  qday for 7 days, 125mg  tiw for 14 days. 05/07/14   Campbell Riches, MD   BP 133/70 mmHg  Pulse 91  Temp(Src) 98.5 F (36.9 C) (Oral)  Resp 20  SpO2 97% Physical Exam  Constitutional: She is oriented to person, place, and time. She appears well-developed and well-nourished.  HENT:  Head: Normocephalic and atraumatic.  Neck:  Tracheostomy and anterior neck.  Cardiovascular: Normal rate and regular rhythm.   No murmur heard. Pulmonary/Chest:  Occasional wheezes bilaterally  Abdominal: Soft. There is no tenderness. There is no rebound.  Musculoskeletal:  2+ pitting edema of the left lower extremity predominantly over the ankle and foot. There are petechiae in coalescing purpura of the anterior shin with greatest collection over the posterior ankle and calf. There is diffuse proximal foot, ankle, and distal shin tenderness. Point of maximal tenderness is over the Achilles tendon and the posterior ankle. 2+ DP pulses bilaterally.  Neurological: She is alert and oriented to person, place, and time.  Skin: Skin is warm and dry.  Psychiatric: She has a normal mood and affect. Her behavior is normal.  Nursing note and vitals reviewed.   ED Course  Procedures (including critical care time) Labs Review Labs Reviewed  CBC WITH DIFFERENTIAL - Abnormal; Notable for the following:    WBC 13.1 (*)    RBC 3.60 (*)    Hemoglobin 11.4 (*)    HCT 34.8 (*)    Neutrophils Relative % 82 (*)    Neutro Abs 10.7 (*)     Lymphocytes Relative 10 (*)    All other components within normal limits  BASIC METABOLIC PANEL - Abnormal; Notable for the following:    Sodium 132 (*)    Chloride 94 (*)    Glucose, Bld 105 (*)    GFR calc non Af Amer 89 (*)    All other components within normal limits    Imaging Review No results found.   EKG Interpretation None      MDM   Final diagnoses:  Cellulitis of left lower extremity    Patient here with progressive swelling and pain of the left lower extremity, concern for cellulitis. Also concern for patient's ability to manage this as an outpatient giving her inability to ambulate and the rapidity progression of her symptoms. DVT study obtained that was negative for DVT. Discussed with medicine regarding admission for further management. Current clinical picture is not consistent with necrotizing fasciitis or gouty arthritis.    Quintella Reichert, MD 06/05/14 [phone]

## 2014-06-05 NOTE — Telephone Encounter (Signed)
[place] [facility] - Client [place] [facility] Patient Name: [name] Gender: Female DOB: Nov 14, 1940 Age: 73 Y 11 M 2 D Return Phone Number: [phone] (Primary) Address: [address] City/State/Zip: [place] [address] Client [facility] - Client Client Site [place] - Day Physician [place], [place] Type Call Call Type Triage / Clinical Caller Name Reather Steller Relationship To Patient Spouse Return Phone Number [phone] (Primary) Chief Complaint Leg Swelling And Edema Initial Comment Caller states, about 130 am , wife's Lt ankle was swollen, red from ankle to knee, felt hot , concern is blood clots PreDisposition Call Doctor Nurse Assessment Nurse: Mallie Mussel, RN, Alveta Heimlich Date/Time Eilene Ghazi Time): 06/05/2014 8:13:12 AM Confirm and document reason for call. If symptomatic, describe symptoms. ---Caller states that his wife's left ankle began swelling this morning. There is redness from her ankle to her knee and it feels hot to touch. Denies any difficulty breathing or chest pain. Has the patient traveled out of the country within the last 30 days? ---No Does the patient require triage? ---Yes Related visit to physician within the last 2 weeks? ---No Does the PT have any chronic conditions? (i.e. diabetes, asthma, etc.) ---Yes List chronic conditions. ---Dementia, UC, CDiff Guidelines Guideline Title Affirmed Question Affirmed Notes Nurse Date/Time Eilene Ghazi Time) Leg Swelling and Edema [1] Can't walk or can barely walk AND [2] new onset Not able to walk on her own now an this is new Mallie Mussel, Therapist, sports, Alveta Heimlich 06/05/2014 8:15:10 AM Disp. Time Eilene Ghazi Time) Disposition Final User 06/05/2014 8:23:45 AM Go to ED Now Yes Mallie Mussel, RN, Ola Spurr Understands: Yes Disagree/Comply: Comply PLEASE NOTE: All timestamps contained within this report are represented as [place] Standard  Time. CONFIDENTIALTY NOTICE: This fax transmission is intended only for the addressee. It contains information that is legally privileged, confidential or otherwise protected from use or disclosure. If you are not the intended recipient, you are strictly prohibited from reviewing, disclosing, copying using or disseminating any of this information or taking any action in reliance on or regarding this information. If you have received this fax in error, please notify us immediately by telephone so that we can arrange for its return to [place]. Phone: [phone], Toll-Free: [phone], Fax: [phone] Page: 2 of 2 Call Id: [id number] Care Advice Given Per Guideline GO TO ED NOW: You need to be seen in the Emergency Department. Go to the ER at ___________ [place] now. Drive carefully. Comments User: Reeves Forth, RN Date/Time Eilene Ghazi Time)

## 2014-06-05 NOTE — ED Notes (Signed)
Unsuccessful attempts x 2 to place PIV site  EDP made aware Per MD, obtain labs via straight stick PIV placement to be obtained if patient requires IV abx

## 2014-06-05 NOTE — ED Notes (Signed)
Dee, RT called and spoke with this nurse r/t issue with patient trach Per Karena Addison, if patient needed to be bagged, a pedi ambu mask would be required, which Pharmacy has Pharmacy staff called and made aware of need for pedi ambu mask Per Larkin Ina in Pharmacy, a mask will be located and sent to the ED

## 2014-06-05 NOTE — Progress Notes (Signed)
Utilization Review completed.  Keshun Berrett RN CM  

## 2014-06-05 NOTE — ED Notes (Signed)
Patient has a stoma for breathing, not a tracheostomy. Patient is desating. Saturations dropped into 70's. Respiratory called for assessment. Patient now sating at 93% on room air.

## 2014-06-05 NOTE — Progress Notes (Signed)
VASCULAR LAB PRELIMINARY  PRELIMINARY  PRELIMINARY  PRELIMINARY  Left lower extremity venous duplex completed.    Preliminary report:  Left:  No evidence of DVT, superficial thrombosis, or Baker's cyst.  [name], RVT 06/05/2014, 12:04 PM

## 2014-06-05 NOTE — H&P (Signed)
[facility] History and Physical  LADANA CHAVERO YHC:[id number] DOB: Dec 14, 1940 DOA: 06/05/2014  Referring physician: Dr. Marjorie Smolder PCP: Scarlette Calico, MD   Chief Complaint: LLE swelling, erythema, warmness and pain  HPI: SOKHA CRAKER is a 73 y.o. female with PMH as listed below; but significant for laryngeal cancer, PVD, atrial fibrillation, HLD, HTN, seizure disorder and prior CVA. Presented to [facility] ED with new onset left foot erythema, swelling and pain. Patient reports symptoms present for the last 2 days and actively worsening. Patient unable to bear weight on her left foot today and decide to seek medical attention. Physical exam demonstrated cellulitis of left foot, patient was afebrile; but with elevated WBC's. [facility] called to admit patient for further evaluation and treatement   Review of Systems:  Negative except as otherwise mentioned on HPI.  Past Medical History  Diagnosis Date  . Peripheral vascular disease   . Subdural hematoma   . Ataxia   . Mild dysplasia of cervix   . UTI (lower urinary tract infection)   . Urinary incontinence   . Cerebrovascular disease   . Personal history of colonic polyps     adenomatous 1997 & tubular adenoma and hyplastic  2008  . Hyperlipidemia     takes Pravastatin daily  . H/O alcohol abuse   . Diverticulosis of colon (without mention of hemorrhage)   . Redundant colon   . C. difficile colitis   . Dementia t  . Ulcerative colitis     takes [place] daily  . Dysrhythmia     a fib-takes Diltiazem daily  . Seizures     takes Phenobarbital and Keppra daily;last seizure was April 1,2014  . Stroke 2000    off balance on right side a little and peripheral vision loss on right side  . Arthritis   . Joint pain   . Joint swelling   . History of UTI 10-2012  . Hypothyroidism     takes Levothyroxine daily  . Squamous cell carcinoma of mouth   . Adenocarcinoma, breast     bilateral  . Anxiety   . Dementia   . Cancer of larynx   .  Hypertension   . Atrial fibrillation   . History of breast cancer   . History of seizures   . Gait disorder   . Oropharyngeal cancer   . History of colitis    Past Surgical History  Procedure Laterality Date  . Subdural hematoma evacuation via craniotomy    . Oral surgery for squamous cell carcinoma of the mouth      x 3  . Multiple tooth extractions      due to oral cancer  . Cataract extraction, bilateral    . Breast lumpectomy      left breast with radiation therapy  . Carotid endarterectomy      left  . Mastectomy      Right, history with nodule dissection  . Orif hip fracture  Sept '12    Right hip: screw and plate repair.  . Larynx surgery  08/2010    [facility] x 2  . Tubal ligation    . Colonoscopy    . Orif humerus fracture Right 10/30/2012    Procedure: OPEN REDUCTION INTERNAL FIXATION (ORIF) HUMERAL SHAFT FRACTURE;  Surgeon: Hessie Dibble, MD;  Location: [place];  Service: Orthopedics;  Laterality: Right;  BIG C-ARM, SHOULDER POSITION  . Hammer toe surgery Right 03/2013    pins and screws  . Flexible sigmoidoscopy N/A  06/21/2013    Procedure: FLEXIBLE SIGMOIDOSCOPY;  Surgeon: Jerene Bears, MD;  Location: [facility];  Service: Gastroenterology;  Laterality: N/A;  . Hardware removal Right 11/14/2013    Procedure: RIGHT SHOUDER HARDWARE REMOVAL;  Surgeon: Nita Sells, MD;  Location: [place];  Service: Orthopedics;  Laterality: Right;  . Incision and drainage Right 11/14/2013    Procedure: INCISION AND DRAINAGE;  Surgeon: Nita Sells, MD;  Location: [place];  Service: Orthopedics;  Laterality: Right;  . Shoulder surgery  10/2013   Social History:  reports that she quit smoking about 9 years ago. She has never used smokeless tobacco. She reports that she drinks about 4.2 oz of alcohol per week. She reports that she does not use illicit drugs.  No Known Allergies  Family History  Problem Relation Age of Onset  . Coronary artery disease Mother   .  Heart disease Mother   . Diabetes Sister   . Breast cancer Maternal Aunt   . Breast cancer Sister   . Colon cancer      nephew (sister's son)     Prior to Admission medications   Medication Sig Start Date End Date Taking? Authorizing Provider  aspirin 81 MG tablet Take 81 mg by mouth daily.   Yes Historical Provider, MD  Budesonide 9 MG TB24 Take 9 mg by mouth daily. 06/03/14  Yes Jerene Bears, MD  Calcium Carbonate-Vitamin D (CALCIUM 500 + D PO) Take 1 tablet by mouth daily.   Yes Historical Provider, MD  diltiazem (DILACOR XR) 180 MG 24 hr capsule Take 1 capsule (180 mg total) by mouth every morning. 02/17/14  Yes Janith Lima, MD  levETIRAcetam (KEPPRA) 750 MG tablet Take 2 tablets (1,500 mg total) by mouth 2 (two) times daily. 03/14/14  Yes Janith Lima, MD  levothyroxine (SYNTHROID, LEVOTHROID) 50 MCG tablet Take 1 tablet (50 mcg total) by mouth every evening. 02/17/14  Yes Janith Lima, MD  lidocaine (XYLOCAINE) 2 % solution Take 20 mLs by mouth every 3 (three) hours as needed for mouth pain. Oral pain   Yes Historical Provider, MD  Mesalamine (ASACOL HD) 800 MG TBEC Take 2 tablets (1,600 mg total) by mouth 3 (three) times daily. 03/05/14  Yes Jerene Bears, MD  Multiple Vitamin (MULTIVITAMIN WITH MINERALS) TABS Take 1 tablet by mouth daily.   Yes Historical Provider, MD  PHENobarbital (LUMINAL) 97.2 MG tablet Take 1 tablet (97.2 mg total) by mouth every morning. 03/14/14  Yes Kathrynn Ducking, MD  pravastatin (PRAVACHOL) 40 MG tablet Take 1 tablet (40 mg total) by mouth every evening. 02/17/14  Yes Janith Lima, MD  vancomycin (VANCOCIN) 50 mg/mL oral solution Take 2.5 mLs (125 mg total) by mouth every 6 (six) hours. 125mg  qid for 14 days, 125mg  bid for 7 days, 125mg  qday for 7 days, 125mg  tiw for 14 days. 05/07/14  Yes Campbell Riches, MD  saccharomyces boulardii (FLORASTOR) 250 MG capsule Take 1 capsule (250 mg total) by mouth 2 (two) times daily. Patient not taking: Reported on  06/05/2014 01/16/14   Jerene Bears, MD   Physical Exam: Filed Vitals:   06/05/14 0932 06/05/14 1316 06/05/14 1650  BP: 133/70 143/89 121/55  Pulse: 91 103 95  Temp: 98.5 F (36.9 C) 99.7 F (37.6 C) 98.9 F (37.2 C)  TempSrc: Oral Oral   Resp: 20 23 18   SpO2: 97% 91% 94%    Wt Readings from Last 3 Encounters:  05/07/14 54.432  kg (120 lb)  04/14/14 53.978 kg (119 lb)  03/26/14 55.339 kg (122 lb)    General:  Appears calm and comfortable; patient unable to talk due to laryngectomy (uses transdermal electronic microphone) Eyes: PERRL, normal lids, irises & conjunctiva; no icterus ENT: grossly normal hearing, mild dryness of MM; no erythema or exudates in her mouth Neck: no LAD, masses or thyromegaly, no JVD. Scar from previous laryngectomy appreciated. Patient with stoma in place. Cardiovascular: RRR, no m/r/g.  Respiratory: CTA bilaterally, Normal respiratory effort. Patient  Abdomen: soft, ntnd Skin: no open wounds; positive petechiae on her LE bilaterally Musculoskeletal: LLE swelling, erythema, warmness and pain. Psychiatric: grossly normal mood and affect; speech impaired since laryngectomy Neurologic: grossly non-focal.          Labs on Admission:  Basic Metabolic Panel:  Recent Labs Lab 06/05/14 1106  NA 132*  K 3.8  CL 94*  CO2 24  GLUCOSE 105*  BUN 8  CREATININE 0.56  CALCIUM 9.2   CBC:  Recent Labs Lab 06/05/14 1106  WBC 13.1*  NEUTROABS 10.7*  HGB 11.4*  HCT 34.8*  MCV 96.7  PLT 187   Radiological Exams on Admission: No results found.  EKG:  None  Assessment/Plan 1-LLE Cellulitis: with swelling, warmness and erythema affecting posterior aspect of foot/ankle and calf -will start broad spectrum antibiotics using vanc and zosyn -leg elevation, cold compresses and PRN analgesics -depending on clinical response; patient might need CT/MRI of her foot -neg duplex for DVT, SVT or baker cyst  2-Hypothyroidism: check TSH -continue  synthroid  3-Anemia, iron deficiency: due to chronic hematochezia with ulcerative colitis Hgb 11.4 currently -will monitor  4-PAF (paroxysmal atrial fibrillation): on sinus rhythm at this moment -continue cardizem and ASA  5-Enteritis due to Clostridium difficile: patient actively taking vancomycin orally. -given needs for further antibiotics for cellulitis will continue oral vancomycin -continue florastor  6-Leukocytosis: secondary to current infection  7-HLD: continue statins  8-essential HTN: well controlled and stable  -will continue current antihypertensive regimen   9-ulcerative colitis: continue mesalamine and budenoside    Code Status: Full DVT Prophylaxis:SCD's were offered, patient refuse them at this moment due to severe pain on her LLE. Positive hematochezia; no heparin products ordered because of that Family Communication: husband at bedside  Disposition Plan: inpatient, LOS > 2 midnights, med-surg bed  Time spent: 50 minutes  Barton Dubois [facility] Pager [phone]

## 2014-06-05 NOTE — ED Notes (Signed)
3 attempts made for peripheral IV. [place] iv will be attempted.

## 2014-06-05 NOTE — ED Notes (Signed)
Pt present with swelling, redness, and warm to touch to left foot and ankle. Pt had slight limp yesterday but today she is not able to put pressure on foot. Pt also red spots to ankle and leg.

## 2014-06-06 ENCOUNTER — Ambulatory Visit: Payer: Medicare Other | Admitting: Internal Medicine

## 2014-06-06 DIAGNOSIS — E038 Other specified hypothyroidism: Secondary | ICD-10-CM

## 2014-06-06 LAB — BASIC METABOLIC PANEL
Anion gap: 16 — ABNORMAL HIGH (ref 5–15)
BUN: 6 mg/dL (ref 6–23)
CO2: 22 mEq/L (ref 19–32)
Calcium: 8.4 mg/dL (ref 8.4–10.5)
Chloride: 94 mEq/L — ABNORMAL LOW (ref 96–112)
Creatinine, Ser: 0.6 mg/dL (ref 0.50–1.10)
GFR calc Af Amer: >90 mL/min (ref >90)
GFR calc non Af Amer: 87 mL/min — ABNORMAL LOW (ref >90)
GLUCOSE: 91 mg/dL (ref 70–99)
Potassium: 3.6 mEq/L — ABNORMAL LOW (ref 3.7–5.3)
Sodium: 132 mEq/L — ABNORMAL LOW (ref 137–147)

## 2014-06-06 LAB — CBC
HEMATOCRIT: 32.6 % — AB (ref 36.0–46.0)
HEMOGLOBIN: 11.2 g/dL — AB (ref 12.0–15.0)
MCH: 32.8 pg (ref 26.0–34.0)
MCHC: 34.4 g/dL (ref 30.0–36.0)
MCV: 95.6 fL (ref 78.0–100.0)
Platelets: 175 10*3/uL (ref 150–400)
RBC: 3.41 MIL/uL — ABNORMAL LOW (ref 3.87–5.11)
RDW: 15.1 % (ref 11.5–15.5)
WBC: 17.7 10*3/uL — ABNORMAL HIGH (ref 4.0–10.5)

## 2014-06-06 MED ORDER — LIDOCAINE VISCOUS 2 % MT SOLN
15.0000 mL | OROMUCOSAL | Status: DC | PRN
  Filled 2014-06-06: qty 15

## 2014-06-06 MED ORDER — FUROSEMIDE 10 MG/ML IJ SOLN
INTRAMUSCULAR | Status: AC
  Filled 2014-06-06: qty 6

## 2014-06-06 MED ORDER — LIDOCAINE VISCOUS 2 % MT SOLN
10.0000 mL | OROMUCOSAL | Status: DC | PRN
  Administered 2014-06-07 – 2014-06-08 (×2): 10 mL via OROMUCOSAL
  Filled 2014-06-06 (×2): qty 10

## 2014-06-06 MED ORDER — ALPRAZOLAM 0.5 MG PO TABS
ORAL_TABLET | ORAL | Status: AC
  Filled 2014-06-06: qty 1

## 2014-06-06 MED ORDER — CHOLESTYRAMINE 4 G PO PACK
4.0000 g | PACK | ORAL | Status: DC
  Administered 2014-06-06 – 2014-06-08 (×3): 4 g via ORAL
  Filled 2014-06-06 (×4): qty 1

## 2014-06-06 MED ORDER — PANTOPRAZOLE SODIUM 40 MG PO TBEC
DELAYED_RELEASE_TABLET | ORAL | Status: AC
  Filled 2014-06-06: qty 1

## 2014-06-06 NOTE — Evaluation (Signed)
Physical Therapy Evaluation Patient Details Name: [name] MRN: [id number] DOB: 07-Jun-1941 Today's Date: 06/06/2014   History of Present Illness  73 y.o. female with past medical history significant for hypothyroidism, atrial fibrillation, enteritis due to Clostridium difficile, history of laryngeal cancer, hypertension, seizure disorder and prior CVA admitted 06/05/14 with new left foot swelling, redness; diagnosed with L LE cellulits.  Clinical Impression  Pt currently with functional limitations due to the deficits listed below (see PT Problem List).  Pt will benefit from skilled PT to increase their independence and safety with mobility to allow discharge to the venue listed below.  Pt ambulated in hallway with RW good distance however urgently needed to use bathroom once back in room.  Pt required some assist for hygiene and new gown donned.  Pt reports unable to bear weight on L LE however feels much improved today.  Recommended pt continue to use RW at home.     Follow Up Recommendations No PT follow up    Equipment Recommendations  None recommended by PT    Recommendations for Other Services       Precautions / Restrictions        Mobility  Bed Mobility               General bed mobility comments: pt up in recliner on arrival  Transfers Overall transfer level: Needs assistance Equipment used: Rolling walker (2 wheeled) Transfers: Sit to/from Stand Sit to Stand: Min guard         General transfer comment: verbal cues for hand placement with RW  Ambulation/Gait Ambulation/Gait assistance: Min guard Ambulation Distance (Feet): 250 Feet Assistive device: Rolling walker (2 wheeled) Gait Pattern/deviations: Step-through pattern;Antalgic;Trunk flexed;Decreased stance time - left     General Gait Details: pt reports no pain during ambulation, verbal cues for RW distance  Stairs            Wheelchair Mobility    Modified Rankin (Stroke Patients  Only)       Balance                                             Pertinent Vitals/Pain Pain Assessment: No/denies pain    Home Living Family/patient expects to be discharged to:: Private residence Living Arrangements: Spouse/significant other   Type of Home: House Home Access: Ramped entrance     Home Layout: One level Home Equipment: Cane - single point;Walker - 2 wheels      Prior Function Level of Independence: Independent with assistive device(s)   Gait / Transfers Assistance Needed: uses SPC for mobility in home however states using RW for unfamiliar environment           Hand Dominance        Extremity/Trunk Assessment               Lower Extremity Assessment: Overall WFL for tasks assessed         Communication   Communication: Other (comment) (stoma, uses electrolarynx)  Cognition Arousal/Alertness: Awake/alert Behavior During Therapy: WFL for tasks assessed/performed Overall Cognitive Status: Within Functional Limits for tasks assessed                      General Comments      Exercises        Assessment/Plan    PT Assessment Patient needs continued PT  services  PT Diagnosis Difficulty walking   PT Problem List Decreased activity tolerance;Decreased mobility;Decreased knowledge of use of DME  PT Treatment Interventions DME instruction;Gait training;Functional mobility training;Patient/family education;Therapeutic activities;Therapeutic exercise   PT Goals (Current goals can be found in the Care Plan section) Acute Rehab PT Goals PT Goal Formulation: With patient Time For Goal Achievement: 06/13/14 Potential to Achieve Goals: Good    Frequency Min 3X/week   Barriers to discharge        Co-evaluation               End of Session   Activity Tolerance: Patient tolerated treatment well Patient left: in chair;with call bell/phone within reach           Time: 1127-1159 PT Time  Calculation (min) (ACUTE ONLY): 32 min   Charges:   PT Evaluation $Initial PT Evaluation Tier I: 1 Procedure PT Treatments $Gait Training: 8-22 mins $Therapeutic Activity: 8-22 mins   PT G Codes:          [name],KATHrine E 06/06/2014, 12:32 PM Carmelia Bake, PT, DPT 06/06/2014 Pager: [phone]

## 2014-06-06 NOTE — Progress Notes (Signed)
Patient ID: [name], female   DOB: 09-08-1940, 73 y.o.   MRN: [id number] [facility] PROGRESS NOTE  [name] VIF:[id number] DOB: [birth year] DOA: 06/05/2014 PCP: Scarlette Calico, MD  Brief narrative:    73 y.o. female with past medical history significant for hypothyroidism, atrial fibrillation, enteritis due to Clostridium difficile, history of laryngeal cancer, hypertension, seizure disorder and prior CVA who presented to [facility] ED with new left foot swelling, redness and tenderness to palpation over past 2 days prior to this admission and worsening. Patient reported significant pain for which reason she can't bear weight on this leg. On admission, patient had blood pressure of 121/55, heart rate of 113, respiratory rate 24 and T max 100.6 F. blood work revealed white blood cell count of 13.1, hemoglobin 11.4, sodium 132 and a normal kidney function. Patient was started on broad spectrum antibiotics to treat for cellulitis and possible sepsis.   Assessment/Plan:     Principal problem: Sepsis secondary to left lower extremity cellulitis / leukocytosis - Sepsis criteria met on the admission with vitals that included tachycardia, tachypnea, fever. In addition blood work revealed leukocytosis and patient had evidence of infection based on swelling and redness in the left lower extremity. - Patient started on broad-spectrum antibiotics, vancomycin and Zosyn - White blood cell count is trending up. We'll continue to monitor. - Lower extremity Doppler negative for DVT.  Active problems: Hypothyroidism - Continue Synthroid - TSH is within normal limits  Anemia of chronic disease - Secondary to history of laryngeal cancer as well as history of ulcerative colitis - Hemoglobin is stable at 11.2 - No current indications for transfusion  Paroxysmal atrial fibrillation - In sinus rhythm. Continue Cardizem and aspirin   Enteritis due to Clostridium difficile - Continue to take  Vanco by mouth. - Continue Florastor. Patient continues to have diarrhea.  Dyslipidemia - Continue statin therapy  Essential hypertension - Continue Cardizem  History of seizures - Continue phenobarbital  History of ulcerative colitis - Continue mesalamine and budesonide   DVT Prophylaxis  - SCDs for DVT prophylaxis  Code Status: Full.  Family Communication:  plan of care discussed with the patient Disposition Plan: Home when stable.   IV access:   Peripheral IV  Procedures and diagnostic studies:    No results found.  Medical Consultants:   None  Other Consultants:   None  IAnti-Infectives:    vanco PO   vanco IV 06/05/2014 -->   Zosyn 06/05/2014 -->   Leisa Lenz, MD  [facility] Pager [phone]  If 7PM-7AM, please contact night-coverage [email or web address] Password [email or web address] 06/06/2014, 11:42 AM   LOS: 1 day    HPI/Subjective: No acute overnight events.  Objective: Filed Vitals:   06/05/14 1650 06/05/14 1700 06/05/14 2131 06/06/14 0635  BP: 121/55  124/60 130/80  Pulse: 95  112 113  Temp: 98.9 F (37.2 C)  99 F (37.2 C) 100.6 F (38.1 C)  TempSrc:   Oral Oral  Resp: _0 Weight:  54.4 kg (119 lb 14.9 oz)    SpO2: 94%  92% 91%    Intake/Output Summary (Last 24 hours) at 06/06/14 1142 Last data filed at 06/06/14 0600  Gross per 24 hour  Intake  537.5 ml  Output      4 ml  Net  533.5 ml    Exam:   General:  Pt is alert, follows commands appropriately, not in acute distress  Cardiovascular: Regular rate and rhythm, S1/S2, no murmurs  Respiratory: Clear to auscultation bilaterally, no wheezing, no crackles, no rhonchi  Abdomen: Soft, non tender, non distended, bowel sounds present  Extremities: LLE erythema and less TTP, pulses DP and PT palpable bilaterally  Neuro: Grossly nonfocal  Data Reviewed: Basic Metabolic Panel:  Recent Labs Lab 06/05/14 1106 06/06/14 0510  NA 132* 132*  K 3.8 3.6*  CL 94* 94*  CO2 24  22  GLUCOSE 105* 91  BUN 8 6  CREATININE 0.56 0.60  CALCIUM 9.2 8.4  MG 2.2  --   PHOS 3.8  --    Liver Function Tests: No results for input(s): AST, ALT, ALKPHOS, BILITOT, PROT, ALBUMIN in the last 168 hours. No results for input(s): LIPASE, AMYLASE in the last 168 hours. No results for input(s): AMMONIA in the last 168 hours. CBC:  Recent Labs Lab 06/05/14 1106 06/06/14 0510  WBC 13.1* 17.7*  NEUTROABS 10.7*  --   HGB 11.4* 11.2*  HCT 34.8* 32.6*  MCV 96.7 95.6  PLT 187 175   Cardiac Enzymes: No results for input(s): CKTOTAL, CKMB, CKMBINDEX, TROPONINI in the last 168 hours. BNP: Invalid input(s): POCBNP CBG: No results for input(s): GLUCAP in the last 168 hours.  Recent Results (from the past 240 hour(s))  Clostridium Difficile by PCR     Status: None   Collection Time: 06/04/14  3:17 PM  Result Value Ref Range Status   C difficile by pcr Not Detected Not Detected Final     Scheduled Meds: . aspirin EC  81 mg Oral Daily  . budesonide  9 mg Oral Daily  . diltiazem  180 mg Oral q morning - 10a  . levETIRAcetam  1,500 mg Oral BID  . levothyroxine  50 mcg Oral QPM  . Mesalamine  800 mg Oral TID  . multivitamin   1 tablet Oral Daily  . PHENobarbital  97.2 mg Oral q morning - 10a  . piperacillin-tazobactam  3.375 g Intravenous Q8H  . pravastatin  40 mg Oral QPM  . saccharomyces boulardii  250 mg Oral BID  . vancomycin  125 mg Oral 4 times per day  . vancomycin  1,000 mg Intravenous Q24H   Continuous Infusions: . sodium chloride 50 mL/hr at 06/05/14 4320

## 2014-06-07 NOTE — Progress Notes (Signed)
Patient ID: [name], female   DOB: 18-Dec-1940, 73 y.o.   MRN: [id number]  [facility] PROGRESS NOTE  SHAWNNA PANCAKE JJO:[id number] DOB: 26-Jul-1940 DOA: 06/05/2014 PCP: Scarlette Calico, MD  Brief narrative:    73 y.o. female with past medical history significant for hypothyroidism, atrial fibrillation, enteritis due to Clostridium difficile, history of laryngeal cancer, hypertension, seizure disorder and prior CVA who presented to [facility] ED with new left foot swelling, redness and tenderness to palpation over past 2 days prior to this admission and worsening. Patient reported significant pain for which reason she can't bear weight on this leg. On admission, patient had blood pressure of 121/55, heart rate of 113, respiratory rate 24 and T max 100.6 F. blood work revealed white blood cell count of 13.1, hemoglobin 11.4, sodium 132 and a normal kidney function. Patient was started on broad spectrum antibiotics to treat for cellulitis and possible sepsis.   Assessment/Plan:   Principal problem: Sepsis secondary to left lower extremity cellulitis / leukocytosis - Sepsis criteria met on the admission with vitals that included tachycardia, tachypnea, fever. In addition blood work revealed leukocytosis and patient had evidence of infection based on swelling and redness in the left lower extremity. - Patient started on broad-spectrum antibiotics, vancomycin and Zosyn - White blood cell count is trending up. We'll continue to monitor.No CBC this AM - Lower extremity Doppler negative for DVT. - Repeat CBC in AM  Active problems: Hypothyroidism - Continue Synthroid - TSH is within normal limits  Anemia of chronic disease - Secondary to history of laryngeal cancer as well as history of ulcerative colitis - Hemoglobin is stable at 11.2 - No current indications for transfusion  Paroxysmal atrial fibrillation - In sinus rhythm. Continue Cardizem and aspirin   Enteritis due to  Clostridium difficile - Continue to take Vanco by mouth. - Continue Florastor. Patient continues to have diarrhea.  Dyslipidemia - Continue statin therapy  Essential hypertension - Continue Cardizem  History of seizures - Continue phenobarbital  History of ulcerative colitis - Continue mesalamine and budesonide  PCM, severe - poor oral intake - encouraged to advance diet - continue nutritional supplements   DVT Prophylaxis  - SCDs for DVT prophylaxis  Code Status: Full.  Family Communication: plan of care discussed with the patient Disposition Plan: Home when stable.   IV access:   Peripheral IV Procedures and diagnostic studies:    No results found. Medical Consultants:   None  Other Consultants:   None  IAnti-Infectives:    vanco PO   vanco IV 06/05/2014 -->   Zosyn 06/05/2014 -->  Faye Ramsay, MD  [facility] Pager [phone]  If 7PM-7AM, please contact night-coverage [email or web address] Password [email or web address] 06/07/2014, 12:10 PM   LOS: 2 days   HPI/Subjective: No events overnight.   Objective: Filed Vitals:   06/06/14 0635 06/06/14 1415 06/06/14 2136 06/07/14 0649  BP: 130/80 124/60 129/62 91/51  Pulse: 113 87 91 86  Temp: 100.6 F (38.1 C) 99 F (37.2 C) 98.4 F (36.9 C) 97.9 F (36.6 C)  TempSrc: Oral Oral Oral Oral  Resp: 24  22 22   Weight:      SpO2: 91% 96% 96% 95%    Intake/Output Summary (Last 24 hours) at 06/07/14 1210 Last data filed at 06/07/14 0800  Gross per 24 hour  Intake    120 ml  Output      0 ml  Net    120 ml    Exam:   General:  Pt  is alert, follows commands appropriately, not in acute distress  Cardiovascular: Regular rate and rhythm, no rubs, no gallops  Respiratory: Clear to auscultation bilaterally, no wheezing, no crackles, no rhonchi  Abdomen: Soft, non tender, non distended, bowel sounds present, no guarding  Extremities: pulses DP and PT palpable bilaterally  Data Reviewed: Basic Metabolic  Panel:  Recent Labs Lab 06/05/14 1106 06/06/14 0510  NA 132* 132*  K 3.8 3.6*  CL 94* 94*  CO2 24 22  GLUCOSE 105* 91  BUN 8 6  CREATININE 0.56 0.60  CALCIUM 9.2 8.4  MG 2.2  --   PHOS 3.8  --    CBC:  Recent Labs Lab 06/05/14 1106 06/06/14 0510  WBC 13.1* 17.7*  NEUTROABS 10.7*  --   HGB 11.4* 11.2*  HCT 34.8* 32.6*  MCV 96.7 95.6  PLT 187 175    Recent Results (from the past 240 hour(s))  Clostridium Difficile by PCR     Status: None   Collection Time: 06/04/14  3:17 PM  Result Value Ref Range Status   C difficile by pcr Not Detected Not Detected Final    Comment: This test is for use only with liquid or soft stools; performance characteristics of other clinical specimen types have not been established.   This assay was performed by Cepheid GeneXpert(R) PCR. The performance characteristics of this assay have been determined by [employer]. Performance characteristics refer to the analytical performance of the test.      Scheduled Meds: . aspirin EC  81 mg Oral Daily  . budesonide  9 mg Oral Daily  . cholestyramine  4 g Oral Q24H  . diltiazem  180 mg Oral q morning - 10a  . levETIRAcetam  1,500 mg Oral BID  . levothyroxine  50 mcg Oral QPM  . Mesalamine  800 mg Oral TID  . multivitamin with minerals  1 tablet Oral Daily  . PHENobarbital  97.2 mg Oral q morning - 10a  . piperacillin-tazobactam (ZOSYN)  IV  3.375 g Intravenous Q8H  . pravastatin  40 mg Oral QPM  . saccharomyces boulardii  250 mg Oral BID  . vancomycin  125 mg Oral 4 times per day  . vancomycin  1,000 mg Intravenous Q24H   Continuous Infusions: . sodium chloride 50 mL/hr at 06/05/14 7425

## 2014-06-08 ENCOUNTER — Encounter (HOSPITAL_COMMUNITY): Payer: Self-pay

## 2014-06-08 LAB — CBC
HEMATOCRIT: 31.4 % — AB (ref 36.0–46.0)
Hemoglobin: 10.5 g/dL — ABNORMAL LOW (ref 12.0–15.0)
MCH: 32.1 pg (ref 26.0–34.0)
MCHC: 33.4 g/dL (ref 30.0–36.0)
MCV: 96 fL (ref 78.0–100.0)
PLATELETS: 185 10*3/uL (ref 150–400)
RBC: 3.27 MIL/uL — ABNORMAL LOW (ref 3.87–5.11)
RDW: 14.8 % (ref 11.5–15.5)
WBC: 8.7 10*3/uL (ref 4.0–10.5)

## 2014-06-08 LAB — BASIC METABOLIC PANEL
ANION GAP: 12 (ref 5–15)
BUN: 4 mg/dL — AB (ref 6–23)
CALCIUM: 8.4 mg/dL (ref 8.4–10.5)
CO2: 25 mEq/L (ref 19–32)
CREATININE: 0.54 mg/dL (ref 0.50–1.10)
Chloride: 97 mEq/L (ref 96–112)
GFR calc non Af Amer: >90 mL/min (ref >90)
Glucose, Bld: 105 mg/dL — ABNORMAL HIGH (ref 70–99)
Potassium: 3 mEq/L — ABNORMAL LOW (ref 3.7–5.3)
Sodium: 134 mEq/L — ABNORMAL LOW (ref 137–147)

## 2014-06-08 MED ORDER — ENSURE COMPLETE PO LIQD
237.0000 mL | Freq: Two times a day (BID) | ORAL | Status: DC
  Administered 2014-06-08 – 2014-06-09 (×2): 237 mL via ORAL

## 2014-06-08 MED ORDER — POTASSIUM CHLORIDE CRYS ER 20 MEQ PO TBCR
40.0000 meq | EXTENDED_RELEASE_TABLET | Freq: Once | ORAL | Status: AC
  Administered 2014-06-08: 40 meq via ORAL
  Filled 2014-06-08: qty 2

## 2014-06-08 MED ORDER — DOXYCYCLINE HYCLATE 100 MG PO TABS
100.0000 mg | ORAL_TABLET | Freq: Two times a day (BID) | ORAL | Status: DC
  Administered 2014-06-08 – 2014-06-09 (×3): 100 mg via ORAL
  Filled 2014-06-08 (×4): qty 1

## 2014-06-08 NOTE — Progress Notes (Signed)
Patient ID: [name], female   DOB: 30-Mar-1941, 73 y.o.   MRN: [id number]  [facility] PROGRESS NOTE  ANESIA BLACKWELL WNI:[id number] DOB: 1940/12/25 DOA: 06/05/2014 PCP: Scarlette Calico, MD  Brief narrative:    73 y.o. female with past medical history significant for hypothyroidism, atrial fibrillation, enteritis due to Clostridium difficile, history of laryngeal cancer, hypertension, seizure disorder and prior CVA who presented to [facility] ED with new left foot swelling, redness and tenderness to palpation over past 2 days prior to this admission and worsening. Patient reported significant pain for which reason she can't bear weight on this leg.  On admission, patient had blood pressure of 121/55, heart rate of 113, respiratory rate 24 and T max 100.6 F. blood work revealed white blood cell count of 13.1, hemoglobin 11.4, sodium 132 and a normal kidney function. Patient was started on broad spectrum antibiotics to treat for cellulitis and possible sepsis.  Assessment/Plan:   Principal problem: Sepsis secondary to left lower extremity cellulitis / leukocytosis - Sepsis criteria met on the admission with vitals that included tachycardia, tachypnea, fever. In addition blood work revealed leukocytosis and patient had evidence of infection based on swelling and redness in the left lower extremity. - Patient started on broad-spectrum antibiotics, vancomycin and Zosyn - will transition to oral ABX doxycycline 12/06  - White blood cell count is trending down and is now WNL. We'll continue to monitor. - Lower extremity Doppler negative for DVT. - Repeat CBC in AM  Active problems: Hypokalemia - will supplement and repeat BMP in AM  Hypothyroidism - Continue Synthroid - TSH is within normal limits  Anemia of chronic disease - Secondary to history of laryngeal cancer as well as history of ulcerative colitis - Hemoglobin slightly down since admission, pt had small amount of blood in  stool this AM - monitor closely  - No current indications for transfusion  Paroxysmal atrial fibrillation - In sinus rhythm. Continue Cardizem and aspirin  Enteritis due to Clostridium difficile - Continue to take Vanco by mouth. - Continue Florastor. Patient continues to have diarrhea.  Dyslipidemia - Continue statin therapy  Essential hypertension - Continue Cardizem  History of seizures - Continue phenobarbital  History of ulcerative colitis - Continue mesalamine and budesonide  PCM, severe - poor oral intake - encouraged to advance diet - continue nutritional supplements   DVT Prophylaxis  - SCDs for DVT prophylaxis  Code Status: Full.  Family Communication: plan of care discussed with the patient Disposition Plan: Home when stable.   IV access:   Peripheral IV Procedures and diagnostic studies:    No results found. Medical Consultants:   None  Other Consultants:   None  IAnti-Infectives:    vanco PO   vanco IV 06/05/2014 --> 12/06  Zosyn 06/05/2014 --> 12/06   Doxycycline 12/06 -->   Faye Ramsay, MD  [facility] Pager [phone]  If 7PM-7AM, please contact night-coverage [email or web address] Password [email or web address] 06/08/2014, 12:54 PM   LOS: 3 days   HPI/Subjective: No events overnight.   Objective: Filed Vitals:   06/07/14 1400 06/07/14 2123 06/08/14 0549 06/08/14 0800  BP: 100/59 107/64 132/58 136/75  Pulse: 70 107 104 90  Temp: 97.1 F (36.2 C) 98.1 F (36.7 C) 97.7 F (36.5 C) 98 F (36.7 C)  TempSrc: Oral Oral Oral Oral  Resp: 18 18 18 18   Weight:      SpO2: 97% 96% 94% 96%    Intake/Output Summary (Last 24 hours) at 06/08/14 1254 Last data filed  at 06/08/14 0836  Gross per 24 hour  Intake   1595 ml  Output    200 ml  Net   1395 ml    Exam:   General:  Pt is alert, follows commands appropriately, not in acute distress  Cardiovascular: Regular rate and rhythm, no rubs, no gallops  Respiratory: Clear to  auscultation bilaterally, no wheezing, no crackles, no rhonchi  Abdomen: Soft, non tender, non distended, bowel sounds present, no guarding  Extremities: LLE erythema improving with no edema and no TTP, pulses DP and PT palpable bilaterally  Neuro: Grossly nonfocal  Data Reviewed: Basic Metabolic Panel:  Recent Labs Lab 06/05/14 1106 06/06/14 0510 06/08/14 0516  NA 132* 132* 134*  K 3.8 3.6* 3.0*  CL 94* 94* 97  CO2 24 22 25   GLUCOSE 105* 91 105*  BUN 8 6 4*  CREATININE 0.56 0.60 0.54  CALCIUM 9.2 8.4 8.4  MG 2.2  --   --   PHOS 3.8  --   --    CBC:  Recent Labs Lab 06/05/14 1106 06/06/14 0510 06/08/14 0516  WBC 13.1* 17.7* 8.7  NEUTROABS 10.7*  --   --   HGB 11.4* 11.2* 10.5*  HCT 34.8* 32.6* 31.4*  MCV 96.7 95.6 96.0  PLT 187 175 185    Recent Results (from the past 240 hour(s))  Clostridium Difficile by PCR     Status: None   Collection Time: 06/04/14  3:17 PM  Result Value Ref Range Status   C difficile by pcr Not Detected Not Detected Final    Comment: This test is for use only with liquid or soft stools; performance characteristics of other clinical specimen types have not been established.   This assay was performed by Cepheid GeneXpert(R) PCR. The performance characteristics of this assay have been determined by [employer]. Performance characteristics refer to the analytical performance of the test.      Scheduled Meds: . aspirin EC  81 mg Oral Daily  . budesonide  9 mg Oral Daily  . cholestyramine  4 g Oral Q24H  . diltiazem  180 mg Oral q morning - 10a  . levETIRAcetam  1,500 mg Oral BID  . levothyroxine  50 mcg Oral QPM  . Mesalamine  800 mg Oral TID  . multivitamin with minerals  1 tablet Oral Daily  . PHENobarbital  97.2 mg Oral q morning - 10a  . piperacillin-tazobactam (ZOSYN)  IV  3.375 g Intravenous Q8H  . pravastatin  40 mg Oral QPM  . saccharomyces boulardii  250 mg Oral BID  . vancomycin  125 mg Oral 4 times per day   . vancomycin  1,000 mg Intravenous Q24H   Continuous Infusions: . sodium chloride 50 mL/hr at 06/05/14 0258

## 2014-06-08 NOTE — Progress Notes (Signed)
UR Completed.  [phone]  

## 2014-06-08 NOTE — Progress Notes (Signed)
Patient continues to have small loose / gritty stool. Brown maroon in color during urination. She had 2 between 1700-1800.

## 2014-06-08 NOTE — Progress Notes (Signed)
ANTIBIOTIC CONSULT NOTE - Follow up  Pharmacy Consult for vancomycin, Zosyn Indication: cellulitis  No Known Allergies  Patient Measurements: Weight: 119 lb 14.9 oz (54.4 kg)  Vital Signs: Temp: 98 F (36.7 C) (12/06 0800) Temp Source: Oral (12/06 0800) BP: 136/75 mmHg (12/06 0800) Pulse Rate: 90 (12/06 0800) Intake/Output from previous day: 12/05 0701 - 12/06 0700 In: 9628 [P.O.:450; I.V.:1215; IV Piggyback:50] Out: -  Intake/Output from this shift:    Labs:  Recent Labs  06/05/14 1106 06/06/14 0510 06/08/14 0516  WBC 13.1* 17.7* 8.7  HGB 11.4* 11.2* 10.5*  PLT 187 175 185  CREATININE 0.56 0.60 0.54   Estimated Creatinine Clearance: 52.2 mL/min (by C-G formula based on Cr of 0.54). No results for input(s): VANCOTROUGH, VANCOPEAK, VANCORANDOM, GENTTROUGH, GENTPEAK, GENTRANDOM, TOBRATROUGH, TOBRAPEAK, TOBRARND, AMIKACINPEAK, AMIKACINTROU, AMIKACIN in the last 72 hours.   Microbiology: Recent Results (from the past 720 hour(s))  Clostridium Difficile by PCR     Status: None   Collection Time: 06/04/14  3:17 PM  Result Value Ref Range Status   C difficile by pcr Not Detected Not Detected Final    Comment: This test is for use only with liquid or soft stools; performance characteristics of other clinical specimen types have not been established.   This assay was performed by Cepheid GeneXpert(R) PCR. The performance characteristics of this assay have been determined by [employer]. Performance characteristics refer to the analytical performance of the test.     Medical History: Past Medical History  Diagnosis Date  . Peripheral vascular disease   . Subdural hematoma   . Ataxia   . Mild dysplasia of cervix   . UTI (lower urinary tract infection)   . Urinary incontinence   . Cerebrovascular disease   . Personal history of colonic polyps     adenomatous 1997 & tubular adenoma and hyplastic  2008  . Hyperlipidemia     takes Pravastatin daily  .  H/O alcohol abuse   . Diverticulosis of colon (without mention of hemorrhage)   . Redundant colon   . C. difficile colitis   . Dementia t  . Ulcerative colitis     takes [place] daily  . Dysrhythmia     a fib-takes Diltiazem daily  . Seizures     takes Phenobarbital and Keppra daily;last seizure was April 1,2014  . Stroke 2000    off balance on right side a little and peripheral vision loss on right side  . Arthritis   . Joint pain   . Joint swelling   . History of UTI 10-2012  . Hypothyroidism     takes Levothyroxine daily  . Squamous cell carcinoma of mouth   . Adenocarcinoma, breast     bilateral  . Anxiety   . Dementia   . Cancer of larynx   . Hypertension   . Atrial fibrillation   . History of breast cancer   . History of seizures   . Gait disorder   . Oropharyngeal cancer   . History of colitis     Medications:  Scheduled:  . aspirin EC  81 mg Oral Daily  . budesonide  9 mg Oral Daily  . cholestyramine  4 g Oral Q24H  . diltiazem  180 mg Oral q morning - 10a  . levETIRAcetam  1,500 mg Oral BID  . levothyroxine  50 mcg Oral QPM  . Mesalamine  800 mg Oral TID  . multivitamin with minerals  1 tablet Oral Daily  .  PHENobarbital  97.2 mg Oral q morning - 10a  . piperacillin-tazobactam (ZOSYN)  IV  3.375 g Intravenous Q8H  . pravastatin  40 mg Oral QPM  . saccharomyces boulardii  250 mg Oral BID  . vancomycin  125 mg Oral 4 times per day  . vancomycin  1,000 mg Intravenous Q24H   Infusions:  . sodium chloride 50 mL/hr at 06/05/14 1040   Assessment: 73 yo female presented to ER with left ankle joint swelling and left foot pain that have progressed over last 2 days. Rash also on left leg that has been progressing. To start vancomycin and Zosyn per pharmacy for treatment of cellulitis.  12/3 >> vancomycin >> 12/3 >> Zosyn >>    Afebrile  WBC improved to WNL  Scr 0.56 with est CrCl of 52 ml/min  Goal of Therapy:  Vancomycin trough level 10-15  mcg/ml  Plan:    Continue Vanc 1g IV q24h   Continue Zosyn 3.375g IV q8 (extended interval infusion)   Check vanc trough before 1200 dose tomorrow   Dolly Rias RPh 06/08/2014, 10:37 AM Pager [phone]

## 2014-06-08 NOTE — Progress Notes (Signed)
INITIAL NUTRITION ASSESSMENT  DOCUMENTATION CODES Per approved criteria  -Non-severe (moderate) malnutrition in the context of acute illness or injury  Pt meets criteria for moderate MALNUTRITION in the context of acute illness as evidenced by mild muscle depletion and fluid accumulation.  INTERVENTION: -Provide Ensure Complete po BID, each supplement provides 350 kcal and 13 grams of protein -Encourage PO intake -RD to continue to monitor  NUTRITION DIAGNOSIS: Increased nutrient (protein) needs related to c. Diff as evidenced by estimated nutritional needs.   Goal: Pt to meet >/= 90% of their estimated nutrition needs   Monitor:  PO and supplemental intake, weight, labs, I/O's  Reason for Assessment: Consult for nutritional assessment  Admitting Dx: Cellulitis  ASSESSMENT: 73 y.o. female with past medical history significant for hypothyroidism, atrial fibrillation, enteritis due to Clostridium difficile, history of laryngeal cancer, hypertension, seizure disorder and prior CVA who presented to [facility] ED with new left foot swelling, redness and tenderness to palpation over past 2 days prior to this admission and worsening.   PO intake: 50%  Pt with 7 lb weight loss since July (6% wt loss x 5 months, insignificant for time frame). Weight is up 8 lb since 2013 per previous nutritional assessment. Pt reports adequate appetite. Pt with questions regarding weight maintenance. Pt states that she drinks 2 Ensures a day. RD to order BID. Emphasized consumption of protein foods to pt to maintain weight and muscle mass.   Nutrition Focused Physical Exam:  Subcutaneous Fat:  Orbital Region: WNL Upper Arm Region: WNL Thoracic and Lumbar Region: NA  Muscle:  Temple Region: mild depletion Clavicle Bone Region: WNL Clavicle and Acromion Bone Region: WNL Scapular Bone Region: WNL Dorsal Hand: WNL Patellar Region: WNL Anterior Thigh Region: WNL Posterior Calf Region: WNL  Edema: +1 RLE  edema, +3 LLE edema  Pt appreciative of RD visit.  Labs reviewed: Low Na, K, BUN Mg/Phos WNL  Height: Ht Readings from Last 1 Encounters:  03/05/14 5\' 5"  (1.651 m)    Weight: Wt Readings from Last 1 Encounters:  06/05/14 119 lb 14.9 oz (54.4 kg)     Ideal Body Weight: 125 lb  % Ideal Body Weight: 95%  Wt Readings from Last 10 Encounters:  06/05/14 119 lb 14.9 oz (54.4 kg)  05/07/14 120 lb (54.432 kg)  04/14/14 119 lb (53.978 kg)  03/26/14 122 lb (55.339 kg)  03/05/14 121 lb (54.885 kg)  02/19/14 121 lb 12.8 oz (55.248 kg)  02/10/14 124 lb (56.246 kg)  02/04/14 125 lb (56.7 kg)  01/13/14 126 lb 3.2 oz (57.244 kg)  12/17/13 126 lb (57.153 kg)    Usual Body Weight: 132 lb -per pt  % Usual Body Weight: 90%  BMI:  Body mass index is 19.96 kg/(m^2).  Estimated Nutritional Needs: Kcal: 1400-1600 Protein: 75-85g Fluid: 1.5L/day  Skin: intact, cellulitis  Diet Order: Diet Heart  EDUCATION NEEDS: -Education needs addressed   Intake/Output Summary (Last 24 hours) at 06/08/14 1020 Last data filed at 06/08/14 0558  Gross per 24 hour  Intake   1595 ml  Output      0 ml  Net   1595 ml    Last BM: 12/6   Labs:   Recent Labs Lab 06/05/14 1106 06/06/14 0510 06/08/14 0516  NA 132* 132* 134*  K 3.8 3.6* 3.0*  CL 94* 94* 97  CO2 24 22 25   BUN 8 6 4*  CREATININE 0.56 0.60 0.54  CALCIUM 9.2 8.4 8.4  MG 2.2  --   --  PHOS 3.8  --   --   GLUCOSE 105* 91 105*    CBG (last 3)  No results for input(s): GLUCAP in the last 72 hours.  Scheduled Meds: . aspirin EC  81 mg Oral Daily  . budesonide  9 mg Oral Daily  . cholestyramine  4 g Oral Q24H  . diltiazem  180 mg Oral q morning - 10a  . levETIRAcetam  1,500 mg Oral BID  . levothyroxine  50 mcg Oral QPM  . Mesalamine  800 mg Oral TID  . multivitamin with minerals  1 tablet Oral Daily  . PHENobarbital  97.2 mg Oral q morning - 10a  . piperacillin-tazobactam (ZOSYN)  IV  3.375 g Intravenous Q8H  .  pravastatin  40 mg Oral QPM  . saccharomyces boulardii  250 mg Oral BID  . vancomycin  125 mg Oral 4 times per day  . vancomycin  1,000 mg Intravenous Q24H    Continuous Infusions: . sodium chloride 50 mL/hr at 06/05/14 7253    Past Medical History  Diagnosis Date  . Peripheral vascular disease   . Subdural hematoma   . Ataxia   . Mild dysplasia of cervix   . UTI (lower urinary tract infection)   . Urinary incontinence   . Cerebrovascular disease   . Personal history of colonic polyps     adenomatous 1997 & tubular adenoma and hyplastic  2008  . Hyperlipidemia     takes Pravastatin daily  . H/O alcohol abuse   . Diverticulosis of colon (without mention of hemorrhage)   . Redundant colon   . C. difficile colitis   . Dementia t  . Ulcerative colitis     takes [place] daily  . Dysrhythmia     a fib-takes Diltiazem daily  . Seizures     takes Phenobarbital and Keppra daily;last seizure was April 1,2014  . Stroke 2000    off balance on right side a little and peripheral vision loss on right side  . Arthritis   . Joint pain   . Joint swelling   . History of UTI 10-2012  . Hypothyroidism     takes Levothyroxine daily  . Squamous cell carcinoma of mouth   . Adenocarcinoma, breast     bilateral  . Anxiety   . Dementia   . Cancer of larynx   . Hypertension   . Atrial fibrillation   . History of breast cancer   . History of seizures   . Gait disorder   . Oropharyngeal cancer   . History of colitis     Past Surgical History  Procedure Laterality Date  . Subdural hematoma evacuation via craniotomy    . Oral surgery for squamous cell carcinoma of the mouth      x 3  . Multiple tooth extractions      due to oral cancer  . Cataract extraction, bilateral    . Breast lumpectomy      left breast with radiation therapy  . Carotid endarterectomy      left  . Mastectomy      Right, history with nodule dissection  . Orif hip fracture  Sept '12    Right hip: screw and  plate repair.  . Larynx surgery  08/2010    [facility] x 2  . Tubal ligation    . Colonoscopy    . Orif humerus fracture Right 10/30/2012    Procedure: OPEN REDUCTION INTERNAL FIXATION (ORIF) HUMERAL SHAFT FRACTURE;  Surgeon: Collier Salina  Autumn Patty, MD;  Location: [place];  Service: Orthopedics;  Laterality: Right;  BIG C-ARM, SHOULDER POSITION  . Hammer toe surgery Right 03/2013    pins and screws  . Flexible sigmoidoscopy N/A 06/21/2013    Procedure: FLEXIBLE SIGMOIDOSCOPY;  Surgeon: Jerene Bears, MD;  Location: [facility];  Service: Gastroenterology;  Laterality: N/A;  . Hardware removal Right 11/14/2013    Procedure: RIGHT SHOUDER HARDWARE REMOVAL;  Surgeon: Nita Sells, MD;  Location: [place];  Service: Orthopedics;  Laterality: Right;  . Incision and drainage Right 11/14/2013    Procedure: INCISION AND DRAINAGE;  Surgeon: Nita Sells, MD;  Location: [place];  Service: Orthopedics;  Laterality: Right;  . Shoulder surgery  10/2013    Clayton Bibles, MS, RD, LDN Pager: [phone] After Hours Pager: [phone]

## 2014-06-08 NOTE — Progress Notes (Signed)
Suction patient with yauker, around stoma per patient request. Tolerated well.

## 2014-06-09 DIAGNOSIS — E43 Unspecified severe protein-calorie malnutrition: Secondary | ICD-10-CM | POA: Insufficient documentation

## 2014-06-09 LAB — CBC
HCT: 32.5 % — ABNORMAL LOW (ref 36.0–46.0)
Hemoglobin: 10.8 g/dL — ABNORMAL LOW (ref 12.0–15.0)
MCH: 32 pg (ref 26.0–34.0)
MCHC: 33.2 g/dL (ref 30.0–36.0)
MCV: 96.4 fL (ref 78.0–100.0)
PLATELETS: 191 10*3/uL (ref 150–400)
RBC: 3.37 MIL/uL — AB (ref 3.87–5.11)
RDW: 14.9 % (ref 11.5–15.5)
WBC: 7.7 10*3/uL (ref 4.0–10.5)

## 2014-06-09 LAB — BASIC METABOLIC PANEL
Anion gap: 12 (ref 5–15)
BUN: 5 mg/dL — ABNORMAL LOW (ref 6–23)
CHLORIDE: 99 meq/L (ref 96–112)
CO2: 27 mEq/L (ref 19–32)
CREATININE: 0.49 mg/dL — AB (ref 0.50–1.10)
Calcium: 8.7 mg/dL (ref 8.4–10.5)
GFR calc non Af Amer: >90 mL/min (ref >90)
Glucose, Bld: 106 mg/dL — ABNORMAL HIGH (ref 70–99)
POTASSIUM: 3.8 meq/L (ref 3.7–5.3)
SODIUM: 138 meq/L (ref 137–147)

## 2014-06-09 MED ORDER — CHOLESTYRAMINE 4 G PO PACK: 4.0000 g | PACK | ORAL | Status: DC

## 2014-06-09 MED ORDER — CEPHALEXIN 250 MG PO CAPS: 250.0000 mg | ORAL_CAPSULE | Freq: Three times a day (TID) | ORAL | Status: DC

## 2014-06-09 MED ORDER — DOXYCYCLINE HYCLATE 100 MG PO TABS: 100.0000 mg | ORAL_TABLET | Freq: Two times a day (BID) | ORAL | Status: DC

## 2014-06-09 MED ORDER — OXYCODONE HCL 5 MG PO TABS: 5.0000 mg | ORAL_TABLET | Freq: Four times a day (QID) | ORAL | Status: DC | PRN

## 2014-06-09 MED FILL — Mesalamine Cap DR 400 MG: ORAL | Qty: 2 | Status: AC

## 2014-06-09 NOTE — Discharge Instructions (Signed)

## 2014-06-09 NOTE — Discharge Summary (Signed)
Physician Discharge Summary  ZAKERIA KULZER MGN:[id number] DOB: [birth year] DOA: 06/05/2014  PCP: Scarlette Calico, MD  Admit date: 06/05/2014 Discharge date: 06/09/2014  Recommendations for Outpatient Follow-up:  1. Pt will need to follow up with PCP in 2-3 weeks post discharge 2. Please obtain BMP to evaluate electrolytes and kidney function 3. Please also check CBC to evaluate Hg and Hct levels 4. Pt will continue taking Keflex and doxycycline for cellulitis 5. Pt also to continue her previous regimen for C. DIff with oral vancomycin   Discharge Diagnoses:  Principal Problem:   Cellulitis Active Problems:   Hypothyroidism   Anemia, iron deficiency   PAF (paroxysmal atrial fibrillation)   Enteritis due to Clostridium difficile   Leukocytosis   Cellulitis of left foot  Discharge Condition: Stable  Diet recommendation: Heart healthy diet discussed in details   Brief narrative:    73 y.o. female with past medical history significant for hypothyroidism, atrial fibrillation, enteritis due to Clostridium difficile, history of laryngeal cancer, hypertension, seizure disorder and prior CVA who presented to [facility] ED with new left foot swelling, redness and tenderness to palpation over past 2 days prior to this admission and worsening. Patient reported significant pain for which reason she can't bear weight on this leg.  On admission, patient had blood pressure of 121/55, heart rate of 113, respiratory rate 24 and T max 100.6 F. blood work revealed white blood cell count of 13.1, hemoglobin 11.4, sodium 132 and a normal kidney function. Patient was started on broad spectrum antibiotics to treat for cellulitis and possible sepsis.  Assessment/Plan:   Principal problem: Sepsis secondary to left lower extremity cellulitis / leukocytosis - Sepsis criteria met on the admission with vitals that included tachycardia, tachypnea, fever. In addition blood work revealed leukocytosis and patient  had evidence of infection based on swelling and redness in the left lower extremity. - Patient started on broad-spectrum antibiotics, vancomycin and Zosyn - transitioned to oral ABX doxycycline 12/06 and Keflex to start taking upon discharge  - White blood cell count is trending down and is now WNL.  - Lower extremity Doppler negative for DVT.  Active problems: Hypokalemia - supplemented   Hypothyroidism - Continue Synthroid - TSH is within normal limits  Anemia of chronic disease - Secondary to history of laryngeal cancer as well as history of ulcerative colitis - Hemoglobin slightly down since admission, no signs of active bleeding  - monitor closely  - No current indications for transfusion  Paroxysmal atrial fibrillation - In sinus rhythm. Continue Cardizem and aspirin  Enteritis due to Clostridium difficile - Continue to take Vanco by mouth. - Continue Florastor.   Dyslipidemia - Continue statin therapy  Essential hypertension - Continue Cardizem  History of seizures - Continue phenobarbital  History of ulcerative colitis - Continue mesalamine and budesonide  PCM, severe (protein calorie malnutrition)  - poor oral intake - encouraged to advance diet - continue nutritional supplements   DVT Prophylaxis  - SCDs for DVT prophylaxis in the hospital   Code Status: Full.  Family Communication: plan of care discussed with the patient Disposition Plan: Home   IV access:   Peripheral IV Procedures and diagnostic studies:    No results found. Medical Consultants:   None  Other Consultants:   None  IAnti-Infectives:    vanco PO   vanco IV 06/05/2014 --> 12/06  Zosyn 06/05/2014 --> 12/06   Doxycycline 12/06 -->  Keflex 12/07 -->    Discharge Exam: Filed Vitals:  06/09/14 0609  BP: 133/76  Pulse: 88  Temp: 98.3 F (36.8 C)  Resp: 20   Filed Vitals:   06/08/14 1300 06/08/14 1500 06/08/14 2146 06/09/14 0609  BP:   114/59 137/78 133/76  Pulse:  88 101 88  Temp:  98.3 F (36.8 C) 98.3 F (36.8 C) 98.3 F (36.8 C)  TempSrc:  Oral Oral Oral  Resp:  20 20 20   Height: 5' 5"  (1.651 m)     Weight:      SpO2:  94% 95% 96%    General: Pt is alert, follows commands appropriately, not in acute distress Cardiovascular: Regular rate and rhythm,  no rubs, no gallops Respiratory: Clear to auscultation bilaterally, no wheezing, no crackles, no rhonchi Abdominal: Soft, non tender, non distended, bowel sounds +, no guarding  Discharge Instructions  Discharge Instructions    Diet - low sodium heart healthy    Complete by:  As directed      Increase activity slowly    Complete by:  As directed             Medication List    TAKE these medications        aspirin 81 MG tablet  Take 81 mg by mouth daily.     Budesonide 9 MG Tb24  Take 9 mg by mouth daily.     CALCIUM 500 + D PO  Take 1 tablet by mouth daily.     cephALEXin 250 MG capsule  Commonly known as:  KEFLEX  Take 1 capsule (250 mg total) by mouth 3 (three) times daily. Continue taking for 10 more days, stop after December 16th, 2015     cholestyramine 4 G packet  Commonly known as:  QUESTRAN  Take 1 packet (4 g total) by mouth daily.     diltiazem 180 MG 24 hr capsule  Commonly known as:  DILACOR XR  Take 1 capsule (180 mg total) by mouth every morning.     doxycycline 100 MG tablet  Commonly known as:  VIBRA-TABS  Take 1 tablet (100 mg total) by mouth every 12 (twelve) hours. Continue taking for 10 more days, stop after December 16th, 2015     levETIRAcetam 750 MG tablet  Commonly known as:  KEPPRA  Take 2 tablets (1,500 mg total) by mouth 2 (two) times daily.     levothyroxine 50 MCG tablet  Commonly known as:  SYNTHROID, LEVOTHROID  Take 1 tablet (50 mcg total) by mouth every evening.     lidocaine 2 % solution  Commonly known as:  XYLOCAINE  Take 20 mLs by mouth every 3 (three) hours as needed for mouth pain. Oral pain      Mesalamine 800 MG Tbec  Commonly known as:  ASACOL HD  Take 2 tablets (1,600 mg total) by mouth 3 (three) times daily.     multivitamin with minerals Tabs tablet  Take 1 tablet by mouth daily.     oxyCODONE 5 MG immediate release tablet  Commonly known as:  Oxy IR/ROXICODONE  Take 1 tablet (5 mg total) by mouth every 6 (six) hours as needed for moderate pain.     PHENobarbital 97.2 MG tablet  Commonly known as:  LUMINAL  Take 1 tablet (97.2 mg total) by mouth every morning.     pravastatin 40 MG tablet  Commonly known as:  PRAVACHOL  Take 1 tablet (40 mg total) by mouth every evening.     saccharomyces boulardii 250 MG capsule  Commonly known  as:  FLORASTOR  Take 1 capsule (250 mg total) by mouth 2 (two) times daily.     vancomycin 50 mg/mL oral solution  Commonly known as:  VANCOCIN  Take 2.5 mLs (125 mg total) by mouth every 6 (six) hours. 137m qid for 14 days, 1264mbid for 7 days, 12528mday for 7 days, 125m55mw for 14 days.           Follow-up Information    Follow up with ThomScarlette Calico.   Specialty:  Internal Medicine   Contact information:   [id number]   Follow up with MAGIFaye Ramsay.   Specialty:  Internal Medicine   Why:  As needed, If symptoms worsen   Contact information:   [id number] [phone]    The results of significant diagnostics from this hospitalization (including imaging, microbiology, ancillary and laboratory) are listed below for reference.     Microbiology: Recent Results (from the past 240 hour(s))  Clostridium Difficile by PCR     Status: None   Collection Time: 06/04/14  3:17 PM  Result Value Ref Range Status   C difficile by pcr Not Detected Not Detected Final    Comment: This test is for use only with liquid or soft stools; performance characteristics of other clinical specimen types have not been  established.   This assay was performed by Cepheid GeneXpert(R) PCR. The performance characteristics of this assay have been determined by [employer] characteristics refer to the analytical performance of the test.      Labs: Basic Metabolic Panel:  Recent Labs Lab 06/05/14 1106 06/06/14 0510 06/08/14 0516 06/09/14 0515  NA 132* 132* 134* 138  K 3.8 3.6* 3.0* 3.8  CL 94* 94* 97 99  CO2 24 22 25 27   GLUCOSE 105* 91 105* 106*  BUN 8 6 4* 5*  CREATININE 0.56 0.60 0.54 0.49*  CALCIUM 9.2 8.4 8.4 8.7  MG 2.2  --   --   --   PHOS 3.8  --   --   --    CBC:  Recent Labs Lab 06/05/14 1106 06/06/14 0510 06/08/14 0516 06/09/14 0515  WBC 13.1* 17.7* 8.7 7.7  NEUTROABS 10.7*  --   --   --   HGB 11.4* 11.2* 10.5* 10.8*  HCT 34.8* 32.6* 31.4* 32.5*  MCV 96.7 95.6 96.0 96.4  PLT 187 175 185 191     SIGNED: Time coordinating discharge: Over 30 minutes  MAGIFaye Ramsay  [facility] 06/09/2014, 10:46 AM Pager [phone] 7PM-7AM, please contact night-coverage [email or web address] Password [email or web address]

## 2014-06-09 NOTE — Progress Notes (Signed)
CARE MANAGEMENT NOTE 06/09/2014  Patient:  [name], [name]   Account Number:  [id number]  Date Initiated:  06/09/2014  Documentation initiated by:  Edwyna Shell  Subjective/Objective Assessment:   73 yo female admitted with cellulitis from home with spouse and support of neighbors, has PCP, phamrmacy, and no transportation concerns     Action/Plan:   discharge planning   Anticipated DC Date:  06/09/2014   Anticipated DC Plan:  [place]  CM consult      Choice offered to / List presented to:             Status of service:  Completed, signed off Medicare Important Message given?  YES (If response is "NO", the following Medicare IM given date fields will be blank) Date Medicare IM given:  06/09/2014 Medicare IM given by:  Edwyna Shell Date Additional Medicare IM given:   Additional Medicare IM given by:    Discharge Disposition:  HOME/SELF CARE  Per UR Regulation:    If discussed at Long Length of Stay Meetings, dates discussed:    Comments:  06/09/14 Edwyna Shell RN BSN CM [phone] No orders, no CM needs

## 2014-06-09 NOTE — Plan of Care (Signed)
Problem: Discharge Progression Outcomes Goal: Barriers To Progression Addressed/Resolved Outcome: Completed/Met Date Met:  06/09/14 Goal: Discharge plan in place and appropriate Outcome: Completed/Met Date Met:  06/09/14 Goal: Pain controlled with appropriate interventions Outcome: Completed/Met Date Met:  06/09/14 Goal: Hemodynamically stable Outcome: Completed/Met Date Met:  06/09/14 Goal: Tolerating diet Outcome: Completed/Met Date Met:  06/09/14 Goal: Activity appropriate for discharge plan Outcome: Completed/Met Date Met:  06/09/14 Goal: Home Health Care arrangements in place Outcome: Not Applicable Date Met:  06/09/14 Goal: Wound improving/decreased edema Outcome: Completed/Met Date Met:  06/09/14 Goal: Patient verbalizes wound care regimen Outcome: Not Applicable Date Met:  06/09/14 Goal: Other Discharge Outcomes/Goals Outcome: Completed/Met Date Met:  06/09/14     

## 2014-06-13 ENCOUNTER — Ambulatory Visit: Payer: Medicare Other | Admitting: Internal Medicine

## 2014-06-13 ENCOUNTER — Telehealth: Payer: Self-pay | Admitting: *Deleted

## 2014-06-13 MED ORDER — MESALAMINE 800 MG PO TBEC: 2.0000 | DELAYED_RELEASE_TABLET | Freq: Three times a day (TID) | ORAL | Status: DC

## 2014-06-13 NOTE — Telephone Encounter (Signed)
Rx faxed to [email or web address].

## 2014-06-13 NOTE — Telephone Encounter (Signed)
I am getting a request from [email or web address] requesting rx for patient's mesalamine 800 mg-2 tablets TID. Is this okay to refill through this company? I am aware patient needs return office visit as well and I will have her to schedule this.

## 2014-06-13 NOTE — Telephone Encounter (Signed)
Yes, please refill

## 2014-06-16 ENCOUNTER — Other Ambulatory Visit: Payer: Self-pay | Admitting: Internal Medicine

## 2014-06-16 MED ORDER — DILTIAZEM HCL ER 180 MG PO CP24: 180.0000 mg | ORAL_CAPSULE | Freq: Every morning | ORAL | Status: DC

## 2014-06-16 MED ORDER — PRAVASTATIN SODIUM 40 MG PO TABS: 40.0000 mg | ORAL_TABLET | Freq: Every evening | ORAL | Status: DC

## 2014-06-16 MED ORDER — LEVOTHYROXINE SODIUM 50 MCG PO TABS: 50.0000 ug | ORAL_TABLET | Freq: Every evening | ORAL | Status: DC

## 2014-06-17 ENCOUNTER — Other Ambulatory Visit: Payer: Self-pay | Admitting: *Deleted

## 2014-06-17 MED ORDER — LEVOTHYROXINE SODIUM 50 MCG PO TABS: 50.0000 ug | ORAL_TABLET | Freq: Every evening | ORAL | Status: DC

## 2014-06-17 MED ORDER — DILTIAZEM HCL ER 180 MG PO CP24: 180.0000 mg | ORAL_CAPSULE | Freq: Every morning | ORAL | Status: DC

## 2014-06-17 MED ORDER — PRAVASTATIN SODIUM 40 MG PO TABS: 40.0000 mg | ORAL_TABLET | Freq: Every evening | ORAL | Status: DC

## 2014-06-18 ENCOUNTER — Encounter: Payer: Self-pay | Admitting: Infectious Diseases

## 2014-06-18 ENCOUNTER — Ambulatory Visit (INDEPENDENT_AMBULATORY_CARE_PROVIDER_SITE_OTHER): Payer: Medicare Other | Admitting: Infectious Diseases

## 2014-06-18 VITALS — BP 139/81 | HR 102 | Temp 98.0°F | Wt 122.0 lb

## 2014-06-18 DIAGNOSIS — L03116 Cellulitis of left lower limb: Secondary | ICD-10-CM

## 2014-06-18 DIAGNOSIS — K51911 Ulcerative colitis, unspecified with rectal bleeding: Secondary | ICD-10-CM

## 2014-06-18 DIAGNOSIS — A0472 Enterocolitis due to Clostridium difficile, not specified as recurrent: Secondary | ICD-10-CM

## 2014-06-18 DIAGNOSIS — T847XXD Infection and inflammatory reaction due to other internal orthopedic prosthetic devices, implants and grafts, subsequent encounter: Secondary | ICD-10-CM

## 2014-06-18 DIAGNOSIS — A047 Enterocolitis due to Clostridium difficile: Secondary | ICD-10-CM

## 2014-06-18 MED ORDER — VANCOMYCIN 50 MG/ML ORAL SOLUTION: 125.0000 mg | Freq: Four times a day (QID) | ORAL | Status: DC

## 2014-06-18 NOTE — Assessment & Plan Note (Signed)
Appears to be resolved. She finishes her anbx for her L foot cellulitis today. Will taper her off po vanco over the next month.

## 2014-06-18 NOTE — Assessment & Plan Note (Signed)
Her shoulder is much better. No heat, no fluctuance, no erythema, no tenderness. We will watch, have her rtc in 4-5 months. In the difficult position of treating her shoulder which causes recurrences of C diff which worsen her ulcerative colitis.

## 2014-06-18 NOTE — Assessment & Plan Note (Signed)
significanlty improved will mark as resolved.  She and her husband are aware that they can call at any time if worsening of any of her sx.

## 2014-06-18 NOTE — Assessment & Plan Note (Signed)
She will f/u with Dr Hilarie Fredrickson. This is improving.

## 2014-06-18 NOTE — Progress Notes (Signed)
   Subjective:    Patient ID: [name], female    DOB: [birth year], 73 y.o.   MRN: [id number]  HPI 72 yo F who in May 2014 underwent ORIF of right proximal humerus fracture and developed hematoma and abscess postoperatively. Initially responded to oral antibiotics after growing Enterococcus, and then in May 2015 had a repeat infection requiring hardware removal by Dr. Tamera Punt on 11/14/2013. She was treated with IV vancomycin through June 25th and then oral amoxicillin. Several days after starting oral amoxicillin, she developed significant diarrhea and was positive C diff PCR. She was treated with po vancomycin and stopped amoxicillin. She completed this and was again challenged with po amoxil (8-4) and again developed C diff. She was seen in f/u 9-23 and was not started back on anbx.   She was started on po anbx/Augmentin and significant loose BM. Has been seen by her GI doctor, felt to have UC flare. No fever or chills. Has had at least 4 loose bm /day. Had C diff+ 9-4.   She returned to hospital 12-2 to 12-11 with RLE cellulitis. She was treated with doxy/keflex on d/c as well as continued on po vanco.  Swelling and pain have improved, "today is the first day that I've had shoes on". Today is last day.  She had recent flare of UC- was started on steroids, mesalamine, cholestyramine, florastor. No blood in BM in last 3 days, stool is starting to form.  Her shoulder has been fine, has not thought about it since last visit.    Review of Systems  Constitutional: Negative for fever, chills, appetite change and unexpected weight change.       Objective:   Physical Exam  Constitutional: She appears well-developed and well-nourished.  HENT:  Mouth/Throat: No oropharyngeal exudate.  Eyes: EOM are normal. Pupils are equal, round, and reactive to light.  Cardiovascular: Normal rate, regular rhythm and normal heart sounds.   Musculoskeletal:       Arms:      Feet:            Assessment & Plan:

## 2014-06-20 ENCOUNTER — Other Ambulatory Visit: Payer: Self-pay | Admitting: Licensed Clinical Social Worker

## 2014-06-20 DIAGNOSIS — A0472 Enterocolitis due to Clostridium difficile, not specified as recurrent: Secondary | ICD-10-CM

## 2014-06-20 MED ORDER — VANCOMYCIN 50 MG/ML ORAL SOLUTION: 125.0000 mg | Freq: Four times a day (QID) | ORAL | Status: DC

## 2014-07-05 ENCOUNTER — Other Ambulatory Visit: Payer: Self-pay | Admitting: Internal Medicine

## 2014-08-04 DIAGNOSIS — J189 Pneumonia, unspecified organism: Secondary | ICD-10-CM

## 2014-08-04 HISTORY — DX: Pneumonia, unspecified organism: J18.9

## 2014-08-11 ENCOUNTER — Other Ambulatory Visit (INDEPENDENT_AMBULATORY_CARE_PROVIDER_SITE_OTHER): Payer: PPO

## 2014-08-11 ENCOUNTER — Ambulatory Visit (INDEPENDENT_AMBULATORY_CARE_PROVIDER_SITE_OTHER)
Admission: RE | Admit: 2014-08-11 | Discharge: 2014-08-11 | Disposition: A | Discharge status: Home / Self Care | Payer: PPO | Source: Ambulatory Visit | Attending: Internal Medicine | Admitting: Internal Medicine

## 2014-08-11 ENCOUNTER — Ambulatory Visit (INDEPENDENT_AMBULATORY_CARE_PROVIDER_SITE_OTHER): Payer: PPO | Admitting: Internal Medicine

## 2014-08-11 ENCOUNTER — Encounter: Payer: Self-pay | Admitting: Internal Medicine

## 2014-08-11 VITALS — BP 100/58 | HR 72 | Ht 65.0 in | Wt 116.2 lb

## 2014-08-11 DIAGNOSIS — R05 Cough: Secondary | ICD-10-CM | POA: Diagnosis not present

## 2014-08-11 DIAGNOSIS — K51911 Ulcerative colitis, unspecified with rectal bleeding: Secondary | ICD-10-CM | POA: Diagnosis not present

## 2014-08-11 DIAGNOSIS — R059 Cough, unspecified: Secondary | ICD-10-CM

## 2014-08-11 LAB — CBC WITH DIFFERENTIAL/PLATELET
BASOS ABS: 0 10*3/uL (ref 0.0–0.1)
Basophils Relative: 0.3 % (ref 0.0–3.0)
Eosinophils Absolute: 0.1 10*3/uL (ref 0.0–0.7)
Eosinophils Relative: 0.7 % (ref 0.0–5.0)
HCT: 33.5 % — ABNORMAL LOW (ref 36.0–46.0)
Hemoglobin: 11.4 g/dL — ABNORMAL LOW (ref 12.0–15.0)
LYMPHS ABS: 1.9 10*3/uL (ref 0.7–4.0)
Lymphocytes Relative: 19 % (ref 12.0–46.0)
MCHC: 33.9 g/dL (ref 30.0–36.0)
MCV: 91.6 fl (ref 78.0–100.0)
Monocytes Absolute: 0.8 10*3/uL (ref 0.1–1.0)
Monocytes Relative: 8.3 % (ref 3.0–12.0)
Neutro Abs: 7.3 10*3/uL (ref 1.4–7.7)
Neutrophils Relative %: 71.7 % (ref 43.0–77.0)
PLATELETS: 256 10*3/uL (ref 150.0–400.0)
RBC: 3.66 Mil/uL — ABNORMAL LOW (ref 3.87–5.11)
RDW: 17.2 % — ABNORMAL HIGH (ref 11.5–15.5)
WBC: 10.2 10*3/uL (ref 4.0–10.5)

## 2014-08-11 LAB — COMPREHENSIVE METABOLIC PANEL
ALK PHOS: 137 U/L — AB (ref 39–117)
ALT: 14 U/L (ref 0–35)
AST: 15 U/L (ref 0–37)
Albumin: 3.5 g/dL (ref 3.5–5.2)
BILIRUBIN TOTAL: 0.6 mg/dL (ref 0.2–1.2)
BUN: 10 mg/dL (ref 6–23)
CHLORIDE: 93 meq/L — AB (ref 96–112)
CO2: 28 mEq/L (ref 19–32)
Calcium: 9.1 mg/dL (ref 8.4–10.5)
Creatinine, Ser: 0.63 mg/dL (ref 0.40–1.20)
GFR: 97.62 mL/min (ref >60.00)
Glucose, Bld: 113 mg/dL — ABNORMAL HIGH (ref 70–99)
Potassium: 4.7 mEq/L (ref 3.5–5.1)
SODIUM: 128 meq/L — AB (ref 135–145)
Total Protein: 7.7 g/dL (ref 6.0–8.3)

## 2014-08-11 LAB — IBC PANEL
Iron: 19 ug/dL — ABNORMAL LOW (ref 42–145)
Saturation Ratios: 6.1 % — ABNORMAL LOW (ref 20.0–50.0)
Transferrin: 221 mg/dL (ref 212.0–360.0)

## 2014-08-11 LAB — HIGH SENSITIVITY CRP: CRP, High Sensitivity: 38.58 mg/L — ABNORMAL HIGH (ref 0.000–5.000)

## 2014-08-11 LAB — VITAMIN B12: VITAMIN B 12: 587 pg/mL (ref 211–911)

## 2014-08-11 LAB — FERRITIN: Ferritin: 29.1 ng/mL (ref 10.0–291.0)

## 2014-08-11 MED ORDER — CHOLESTYRAMINE 4 G PO PACK: 4.0000 g | PACK | Freq: Every evening | ORAL | Status: DC

## 2014-08-11 MED ORDER — PREDNISONE 10 MG PO TABS: 10.0000 mg | ORAL_TABLET | ORAL | Status: DC

## 2014-08-11 NOTE — Progress Notes (Signed)
Subjective:    Patient ID: [name], female    DOB: 1940/10/22, 74 y.o.   MRN: [id number]  HPI [name] is a 74 year old female with past medical history of ulcerative colitis, history of C. 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 She was last seen in September at which time she was feeling fairly well. She has been taking Asacol HD 4.8g daily.  She developed bloody diarrhea in late November and budesonide 9 mg daily was started 8 weeks on 06/03/2014. This was after C. difficile was negative. She has just completed this therapy.  Uceris was prescribed at this was changed to generic budesonide due to cost.  She has continued to have bloody diarrhea though stools have been slightly more formed because she is using cholestyramine 4 g each evening. She is having fecal urgency and episodes of fecal incontinence. Her husband has continued to provide care at home. They estimate 6-7 bowel movements per day. Overall energy level has been down. She fell in the middle the night when trying to go to the bathroom recently and struck her right eye. Change in vision. She was taking Florastor 250 mg twice daily and stopped within the past week because they ran out. She denies abdominal pain. Denies nausea or vomiting. Overall appetite is good but she avoids foods that are particularly high in fiber like salads.  She was hospitalized with lower extremity cellulitis requiring IV antibiotics in mid December 2015  She has noticed thick secretions which are been difficult to clear via tracheostomy. Denies chest pain or dyspnea.  Review of Systems As per history of present illness, otherwise negative  Current Medications, Allergies, Past Medical History, Past Surgical History, Family History and Social History were reviewed in [employer] record.     Objective:   Physical Exam BP 100/58 mmHg  Pulse 72  Ht 5\' 5"  (1.651 m)  Wt 116 lb 3.2 oz (52.708 kg)  BMI 19.34 kg/m2  SpO2 96% Constitutional: Well-developed and well-nourished. No distress. HEENT: Normocephalic, bruising under right orbit. Oropharynx is clear and moist. No oropharyngeal exudate. Conjunctivae are normal.  No scleral icterus. Neck: Stoma in place with anterior neck scarring Cardiovascular: Normal rate, regular rhythm and intact distal pulses. No M/R/G Pulmonary/chest: Effort normal and breath sounds normal. No wheezing, rales or rhonchi. Abdominal: Soft, nontender, nondistended. Bowel sounds active throughout.  Extremities: no clubbing, cyanosis, or edema Lymphadenopathy: No cervical adenopathy noted. Neurological: Alert and oriented to person place and time. Psychiatric: Normal mood and affect. Behavior is normal.  CBC    Component Value Date/Time   WBC 10.2 08/11/2014 1454   RBC 3.66* 08/11/2014 1454   RBC 2.91* 10/30/2011 0502   HGB 11.4* 08/11/2014 1454   HCT 33.5* 08/11/2014 1454   PLT 256.0 08/11/2014 1454   MCV 91.6 08/11/2014 1454   MCH 32.0 06/09/2014 0515   MCHC 33.9 08/11/2014 1454   RDW 17.2* 08/11/2014 1454   LYMPHSABS 1.9 08/11/2014 1454   MONOABS 0.8 08/11/2014 1454   EOSABS 0.1 08/11/2014 1454   BASOSABS 0.0 08/11/2014 1454    CMP     Component Value Date/Time   NA 128* 08/11/2014 1454   K 4.7 08/11/2014 1454   CL 93* 08/11/2014 1454   CO2 28 08/11/2014 1454   GLUCOSE 113* 08/11/2014 1454   BUN 10 08/11/2014 1454   CREATININE  0.63 08/11/2014 1454   CALCIUM 9.1 08/11/2014 1454   PROT 7.7 08/11/2014 1454   ALBUMIN 3.5 08/11/2014 1454   AST 15 08/11/2014 1454   ALT 14 08/11/2014 1454   ALKPHOS 137* 08/11/2014 1454   BILITOT 0.6 08/11/2014 1454   GFRNONAA >90 06/09/2014 0515   GFRAA >90 06/09/2014 0515      Assessment & Plan:   74 year old female with past medical history of ulcerative colitis, history of  C. 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   1. UC -- she continues to have symptoms consistent with active UC. Given her history I want to again rule out C. difficile. She had antibiotics back in December. Unfortunately she could not afford the colonic release budesonide and the ileal release budesonide did not improve symptoms. Limited options and therefore will start her on prednisone 40 mg daily 14 days with plans to taper by 5 mg every 7 days. I asked that they notify me if she fails to improve or if symptoms return as she tapers. Check labs today to include CBC, CMP, CRP, B12 and iron studies. She can continue Questran 4 g in the evening to help with diarrhea. Continue current dose of mesalamine, 4.8 g daily --Follow-up in 8 weeks  2. Cough with thick secretions -- CXR today

## 2014-08-11 NOTE — Patient Instructions (Signed)
We have sent the following medications to your pharmacy for you to pick up at your convenience: Questran Prednisone  Your provider has requested that you have a chest x ray before leaving today. Please go to the basement floor to our Radiology department for the test.  Dr Hilarie Fredrickson has advised that you be on a prednisone taper. The taper instructions are as follows: Prednisone 40 mg (4 tablets daily) x 14 days. Prednisone 35 mg (3.5 tablets) daily x 14 days. Prednisone 30 mg (3 tablets) daily x 14 days Prednisone 25 mg (2.5 tablet) daily x 14 days Prednisone 20 mg (2 tablets) daily x 14 days Prednisone 15 mg (1.5 tablets) daily x 14 days Prednisone 10 mg (1 tablet) daily x 14 days Prednisone 5 mg (0.5 tablet) daily x 14 days, Then, discontinue.  If you do not see any improvement in your symptoms while on the prednisone OR if you feel that the prednisone loses effectiveness while you are on the taper, please call us at [phone] to let us know that.  Please follow up with Dr Hilarie Fredrickson on Wednesday, 10/08/14 @ 11:00 am.  CC:Dr Scarlette Calico

## 2014-08-12 ENCOUNTER — Encounter: Payer: Self-pay | Admitting: Family

## 2014-08-12 ENCOUNTER — Ambulatory Visit (INDEPENDENT_AMBULATORY_CARE_PROVIDER_SITE_OTHER): Payer: PPO | Admitting: Family

## 2014-08-12 VITALS — BP 120/70 | HR 106 | Temp 98.6°F | Resp 18 | Wt 117.8 lb

## 2014-08-12 DIAGNOSIS — J189 Pneumonia, unspecified organism: Secondary | ICD-10-CM

## 2014-08-12 MED ORDER — LEVOFLOXACIN 500 MG PO TABS: 500.0000 mg | ORAL_TABLET | Freq: Every day | ORAL | Status: DC

## 2014-08-12 NOTE — Assessment & Plan Note (Signed)
Chest x-ray reveals infiltrates consistent with pneumonia. Patient's oxygen saturation is noted to be slightly lower, however she has no wheezing and is of good mental status. Instructed if either of these worsen to go to the hospital. Start Levaquin. Continue over-the-counter medications as needed for symptom relief. Follow-up if symptoms worsen or fail to improve.

## 2014-08-12 NOTE — Progress Notes (Signed)
Pre visit review using our clinic review tool, if applicable. No additional management support is needed unless otherwise documented below in the visit note. 

## 2014-08-12 NOTE — Patient Instructions (Signed)
Thank you for choosing [facility].  Summary/Instructions:  Your prescription(s) have been submitted to your pharmacy or been printed and provided for you. Please take as directed and contact our office if you believe you are having problem(s) with the medication(s) or have any questions.  If your symptoms worsen or fail to improve, please contact our office for further instruction, or in case of emergency go directly to the emergency room at the closest medical facility.   Pneumonia Pneumonia is an infection of the lungs.  CAUSES Pneumonia may be caused by bacteria or a virus. Usually, these infections are caused by breathing infectious particles into the lungs (respiratory tract). SIGNS AND SYMPTOMS   Cough.  Fever.  Chest pain.  Increased rate of breathing.  Wheezing.  Mucus production. DIAGNOSIS  If you have the common symptoms of pneumonia, your health care provider will typically confirm the diagnosis with a chest X-ray. The X-ray will show an abnormality in the lung (pulmonary infiltrate) if you have pneumonia. Other tests of your blood, urine, or sputum may be done to find the specific cause of your pneumonia. Your health care provider may also do tests (blood gases or pulse oximetry) to see how well your lungs are working. TREATMENT  Some forms of pneumonia may be spread to other people when you cough or sneeze. You may be asked to wear a mask before and during your exam. Pneumonia that is caused by bacteria is treated with antibiotic medicine. Pneumonia that is caused by the influenza virus may be treated with an antiviral medicine. Most other viral infections must run their course. These infections will not respond to antibiotics.  HOME CARE INSTRUCTIONS   Cough suppressants may be used if you are losing too much rest. However, coughing protects you by clearing your lungs. You should avoid using cough suppressants if you can.  Your health care provider may have  prescribed medicine if he or she thinks your pneumonia is caused by bacteria or influenza. Finish your medicine even if you start to feel better.  Your health care provider may also prescribe an expectorant. This loosens the mucus to be coughed up.  Take medicines only as directed by your health care provider.  Do not smoke. Smoking is a common cause of bronchitis and can contribute to pneumonia. If you are a smoker and continue to smoke, your cough may last several weeks after your pneumonia has cleared.  A cold steam vaporizer or humidifier in your room or home may help loosen mucus.  Coughing is often worse at night. Sleeping in a semi-upright position in a recliner or using a couple pillows under your head will help with this.  Get rest as you feel it is needed. Your body will usually let you know when you need to rest. PREVENTION A pneumococcal shot (vaccine) is available to prevent a common bacterial cause of pneumonia. This is usually suggested for:  People over 108 years old.  Patients on chemotherapy.  People with chronic lung problems, such as bronchitis or emphysema.  People with immune system problems. If you are over 65 or have a high risk condition, you may receive the pneumococcal vaccine if you have not received it before. In some countries, a routine influenza vaccine is also recommended. This vaccine can help prevent some cases of pneumonia.You may be offered the influenza vaccine as part of your care. If you smoke, it is time to quit. You may receive instructions on how to stop smoking. Your health  care provider can provide medicines and counseling to help you quit. SEEK MEDICAL CARE IF: You have a fever. SEEK IMMEDIATE MEDICAL CARE IF:   Your illness becomes worse. This is especially true if you are elderly or weakened from any other disease.  You cannot control your cough with suppressants and are losing sleep.  You begin coughing up blood.  You develop pain  which is getting worse or is uncontrolled with medicines.  Any of the symptoms which initially brought you in for treatment are getting worse rather than better.  You develop shortness of breath or chest pain. MAKE SURE YOU:   Understand these instructions.  Will watch your condition.  Will get help right away if you are not doing well or get worse. Document Released: 06/20/2005 Document Revised: 11/04/2013 Document Reviewed: 09/09/2010 [facility] Patient Information 2015 [place], [place]. This information is not intended to replace advice given to you by your health care provider. Make sure you discuss any questions you have with your health care provider.

## 2014-08-12 NOTE — Progress Notes (Signed)
Subjective:    Patient ID: [name], female    DOB: 1940/10/02, 74 y.o.   MRN: [id number]  Chief Complaint  Patient presents with  . Pneumonia    found out this morning she had pneumonia when getting an xray done    HPI:  [name] is a 74 y.o. female who presents today for an acute visit. Her husband is present for today's visit   This is a new problem. Patient was seen yesterday by Dr. Hilarie Fredrickson and was noted to have thick secretions that have been difficult to clear. She was sent for a chest x-ray which revealed infiltrates and potential pneumonia. She presents today with the main associated symptoms of cough and chest congestion which has been going on for a couple of weeks. Denies any modifying factors. Not currently experiecing any shortness of breath.  No Known Allergies   Current Outpatient Prescriptions on File Prior to Visit  Medication Sig Dispense Refill  . aspirin 81 MG tablet Take 81 mg by mouth daily.    . Calcium Carbonate-Vitamin D (CALCIUM 500 + D PO) Take 1 tablet by mouth daily.    . cholestyramine (QUESTRAN) 4 G packet Take 1 packet (4 g total) by mouth every evening. 30 each 2  . diltiazem (DILACOR XR) 180 MG 24 hr capsule Take 1 capsule (180 mg total) by mouth every morning. 90 capsule 3  . levETIRAcetam (KEPPRA) 750 MG tablet TAKE 2 TABLETS TWICE A DAY 120 tablet 11  . levothyroxine (SYNTHROID, LEVOTHROID) 50 MCG tablet Take 1 tablet (50 mcg total) by mouth every evening. 90 tablet 3  . lidocaine (XYLOCAINE) 2 % solution Take 20 mLs by mouth every 3 (three) hours as needed for mouth pain. Oral pain    . Mesalamine (ASACOL HD) 800 MG TBEC Take 2 tablets (1,600 mg total) by mouth 3 (three) times daily. NEEDS OFFICE VISIT FOR FURTHER REFILLS 540 tablet 0  . Multiple Vitamin (MULTIVITAMIN WITH MINERALS) TABS Take 1 tablet by mouth daily.    Marland Kitchen PHENobarbital (LUMINAL) 97.2 MG tablet Take 1 tablet (97.2 mg total) by mouth every morning. 90 tablet 1  .  pravastatin (PRAVACHOL) 40 MG tablet Take 1 tablet (40 mg total) by mouth every evening. 90 tablet 1  . predniSONE (DELTASONE) 10 MG tablet Take 1 tablet (10 mg total) by mouth as directed. 260 tablet 0  . saccharomyces boulardii (FLORASTOR) 250 MG capsule Take 1 capsule (250 mg total) by mouth 2 (two) times daily. 60 capsule 0   No current facility-administered medications on file prior to visit.    Review of Systems  Constitutional: Negative for fever.  Respiratory: Positive for cough. Negative for chest tightness, shortness of breath and wheezing.   Neurological: Negative for headaches.      Objective:    BP 120/70 mmHg  Pulse 106  Temp(Src) 98.6 F (37 C) (Oral)  Resp 18  Wt 117 lb 12.8 oz (53.434 kg)  SpO2 88% Nursing note and vital signs reviewed.  Physical Exam  Constitutional: She is oriented to person, place, and time. She appears well-developed and well-nourished. No distress.  HENT:  Right Ear: Hearing, tympanic membrane, external ear and ear canal normal.  Left Ear: Hearing, tympanic membrane, external ear and ear canal normal.  Nose: Nose normal. Right sinus exhibits no maxillary sinus tenderness and no frontal sinus tenderness. Left sinus exhibits no maxillary sinus tenderness and no frontal sinus tenderness.  Mouth/Throat: Uvula is midline, oropharynx is clear and  moist and mucous membranes are normal.  Cardiovascular: Normal rate, regular rhythm, normal heart sounds and intact distal pulses.   Pulmonary/Chest: Effort normal and breath sounds normal.  Neurological: She is alert and oriented to person, place, and time.  Skin: Skin is warm and dry.  Psychiatric: She has a normal mood and affect. Her behavior is normal. Judgment and thought content normal.       Assessment & Plan:

## 2014-08-14 LAB — SPEP & IFE WITH QIG
ALPHA-2-GLOBULIN: 11.1 % (ref 7.1–11.8)
Albumin ELP: 44.7 % — ABNORMAL LOW (ref 55.8–66.1)
Alpha-1-Globulin: 7.7 % — ABNORMAL HIGH (ref 2.9–4.9)
BETA GLOBULIN: 5.6 % (ref 4.7–7.2)
Beta 2: 4.4 % (ref 3.2–6.5)
GAMMA GLOBULIN: 26.5 % — AB (ref 11.1–18.8)
IGG (IMMUNOGLOBIN G), SERUM: 2160 mg/dL — AB (ref 690–1700)
IgA: 316 mg/dL (ref 69–380)
IgM, Serum: 91 mg/dL (ref 52–322)
Total Protein, Serum Electrophoresis: 7.8 g/dL (ref 6.0–8.3)

## 2014-08-18 ENCOUNTER — Other Ambulatory Visit: Payer: PPO

## 2014-08-18 DIAGNOSIS — R05 Cough: Secondary | ICD-10-CM

## 2014-08-18 DIAGNOSIS — K51911 Ulcerative colitis, unspecified with rectal bleeding: Secondary | ICD-10-CM

## 2014-08-18 DIAGNOSIS — R059 Cough, unspecified: Secondary | ICD-10-CM

## 2014-08-19 LAB — CLOSTRIDIUM DIFFICILE BY PCR: Toxigenic C. Difficile by PCR: NOT DETECTED

## 2014-09-17 ENCOUNTER — Ambulatory Visit (INDEPENDENT_AMBULATORY_CARE_PROVIDER_SITE_OTHER): Payer: PPO | Admitting: Internal Medicine

## 2014-09-17 ENCOUNTER — Other Ambulatory Visit (INDEPENDENT_AMBULATORY_CARE_PROVIDER_SITE_OTHER): Payer: PPO

## 2014-09-17 ENCOUNTER — Encounter: Payer: Self-pay | Admitting: Internal Medicine

## 2014-09-17 VITALS — BP 134/80 | HR 93 | Temp 98.2°F | Resp 16 | Ht 65.0 in | Wt 120.0 lb

## 2014-09-17 DIAGNOSIS — E890 Postprocedural hypothyroidism: Secondary | ICD-10-CM

## 2014-09-17 DIAGNOSIS — D509 Iron deficiency anemia, unspecified: Secondary | ICD-10-CM

## 2014-09-17 DIAGNOSIS — R609 Edema, unspecified: Secondary | ICD-10-CM

## 2014-09-17 DIAGNOSIS — I10 Essential (primary) hypertension: Secondary | ICD-10-CM

## 2014-09-17 DIAGNOSIS — I480 Paroxysmal atrial fibrillation: Secondary | ICD-10-CM

## 2014-09-17 LAB — CBC WITH DIFFERENTIAL/PLATELET
BASOS ABS: 0 10*3/uL (ref 0.0–0.1)
Basophils Relative: 0.2 % (ref 0.0–3.0)
EOS ABS: 0 10*3/uL (ref 0.0–0.7)
Eosinophils Relative: 0 % (ref 0.0–5.0)
HEMATOCRIT: 42.2 % (ref 36.0–46.0)
Hemoglobin: 14.3 g/dL (ref 12.0–15.0)
Lymphocytes Relative: 9.4 % — ABNORMAL LOW (ref 12.0–46.0)
Lymphs Abs: 1.3 10*3/uL (ref 0.7–4.0)
MCHC: 33.8 g/dL (ref 30.0–36.0)
MCV: 93.3 fl (ref 78.0–100.0)
MONO ABS: 0.4 10*3/uL (ref 0.1–1.0)
Monocytes Relative: 3.2 % (ref 3.0–12.0)
NEUTROS PCT: 87.2 % — AB (ref 43.0–77.0)
Neutro Abs: 11.9 10*3/uL — ABNORMAL HIGH (ref 1.4–7.7)
PLATELETS: 211 10*3/uL (ref 150.0–400.0)
RBC: 4.53 Mil/uL (ref 3.87–5.11)
RDW: 18.7 % — ABNORMAL HIGH (ref 11.5–15.5)
WBC: 13.6 10*3/uL — AB (ref 4.0–10.5)

## 2014-09-17 LAB — COMPREHENSIVE METABOLIC PANEL
ALK PHOS: 133 U/L — AB (ref 39–117)
ALT: 23 U/L (ref 0–35)
AST: 15 U/L (ref 0–37)
Albumin: 4.3 g/dL (ref 3.5–5.2)
BUN: 15 mg/dL (ref 6–23)
CO2: 34 mEq/L — ABNORMAL HIGH (ref 19–32)
CREATININE: 0.61 mg/dL (ref 0.40–1.20)
Calcium: 10.1 mg/dL (ref 8.4–10.5)
Chloride: 94 mEq/L — ABNORMAL LOW (ref 96–112)
GFR: 101.3 mL/min (ref >60.00)
GLUCOSE: 87 mg/dL (ref 70–99)
Potassium: 3.7 mEq/L (ref 3.5–5.1)
Sodium: 133 mEq/L — ABNORMAL LOW (ref 135–145)
Total Bilirubin: 0.6 mg/dL (ref 0.2–1.2)
Total Protein: 7.8 g/dL (ref 6.0–8.3)

## 2014-09-17 LAB — URINALYSIS, ROUTINE W REFLEX MICROSCOPIC
Bilirubin Urine: NEGATIVE
Hgb urine dipstick: NEGATIVE
KETONES UR: NEGATIVE
LEUKOCYTES UA: NEGATIVE
Nitrite: NEGATIVE
PH: 7 (ref 5.0–8.0)
RBC / HPF: NONE SEEN (ref 0–?)
Specific Gravity, Urine: 1.015 (ref 1.000–1.030)
Total Protein, Urine: NEGATIVE
UROBILINOGEN UA: 0.2 (ref 0.0–1.0)
Urine Glucose: NEGATIVE
WBC, UA: NONE SEEN (ref 0–?)

## 2014-09-17 LAB — BRAIN NATRIURETIC PEPTIDE: PRO B NATRI PEPTIDE: 180 pg/mL — AB (ref 0.0–100.0)

## 2014-09-17 LAB — TSH: TSH: 0.98 u[IU]/mL (ref 0.35–4.50)

## 2014-09-17 MED ORDER — TORSEMIDE 20 MG PO TABS: 20.0000 mg | ORAL_TABLET | Freq: Every day | ORAL | Status: DC

## 2014-09-17 NOTE — Assessment & Plan Note (Signed)
The obvious causes for this are the steroids, the CCB, warm temperature, and possible sodium intake I will check her labs today to screen for low protein state, renal disease, DVT, fluid overload, nephrotic syndrome, thyroid disease, etc, - see orders Will hold the CCB for now Will add a loop diuretic to reduce the edema

## 2014-09-17 NOTE — Progress Notes (Signed)
Subjective:    Patient ID: [name], female    DOB: 1941/06/08, 74 y.o.   MRN: [id number]  HPI Comments: Edema - both feet and ankles. Worsening over the last 2 weeks. Started after she was placed on prednisone.     Review of Systems  Constitutional: Negative.  Negative for fever, chills, diaphoresis, appetite change and fatigue.  HENT: Negative.   Eyes: Negative.   Respiratory: Negative.  Negative for apnea, cough, choking, chest tightness, shortness of breath, wheezing and stridor.   Cardiovascular: Positive for leg swelling. Negative for chest pain and palpitations.  Gastrointestinal: Negative.  Negative for nausea, vomiting, abdominal pain, diarrhea and constipation.  Endocrine: Negative.   Genitourinary: Negative.  Negative for dysuria, urgency, hematuria, enuresis and difficulty urinating.  Musculoskeletal: Negative.  Negative for myalgias, back pain, joint swelling and arthralgias.  Skin: Negative.   Allergic/Immunologic: Negative.   Neurological: Negative.  Negative for dizziness, tremors, weakness, light-headedness, numbness and headaches.  Hematological: Negative.  Negative for adenopathy. Does not bruise/bleed easily.  Psychiatric/Behavioral: Negative.        Objective:   Physical Exam  Constitutional: She is oriented to person, place, and time. She appears well-developed and well-nourished.  HENT:  Head: Normocephalic and atraumatic.  Mouth/Throat: Oropharynx is clear and moist. No oropharyngeal exudate.  Eyes: Conjunctivae are normal. Right eye exhibits no discharge. Left eye exhibits no discharge. No scleral icterus.  Neck: Normal range of motion. Neck supple. No JVD present. No tracheal deviation present. No thyromegaly present.  Cardiovascular: Normal rate, normal heart sounds and intact distal pulses.  An irregularly irregular rhythm present. Exam reveals no gallop and no friction rub.   No murmur heard. Pulses:      Carotid pulses are 1+ on the right  side, and 1+ on the left side.      Radial pulses are 1+ on the right side, and 1+ on the left side.       Femoral pulses are 1+ on the right side, and 1+ on the left side.      Popliteal pulses are 1+ on the right side, and 1+ on the left side.       Dorsalis pedis pulses are 1+ on the right side, and 1+ on the left side.       Posterior tibial pulses are 1+ on the right side, and 1+ on the left side.  Pulmonary/Chest: Effort normal and breath sounds normal. No respiratory distress. She has no wheezes. She has no rales. She exhibits no tenderness.  Abdominal: Soft. Bowel sounds are normal. She exhibits no distension and no mass. There is no tenderness. There is no rebound and no guarding.  Musculoskeletal: Normal range of motion. She exhibits edema (2-3 + bilateral pitting edema over both feet to the ankles). She exhibits no tenderness.  Lymphadenopathy:    She has no cervical adenopathy.  Neurological: She is oriented to person, place, and time.  Skin: Skin is warm and dry. No rash noted. She is not diaphoretic. No erythema. No pallor.  Psychiatric: She has a normal mood and affect. Her behavior is normal. Judgment and thought content normal.  Vitals reviewed.    Lab Results  Component Value Date   WBC 10.2 08/11/2014   HGB 11.4* 08/11/2014   HCT 33.5* 08/11/2014   PLT 256.0 08/11/2014   GLUCOSE 113* 08/11/2014   CHOL 165 02/10/2014   TRIG 57.0 02/10/2014   HDL 76.20 02/10/2014   LDLCALC 77 02/10/2014   ALT 14  08/11/2014   AST 15 08/11/2014   NA 128* 08/11/2014   K 4.7 08/11/2014   CL 93* 08/11/2014   CREATININE 0.63 08/11/2014   BUN 10 08/11/2014   CO2 28 08/11/2014   TSH 1.280 06/05/2014   INR 1.26 03/18/2012   HGBA1C 5.7 02/10/2014       Assessment & Plan:

## 2014-09-17 NOTE — Assessment & Plan Note (Signed)
I will recheck her TSh and will adjust her dose if needed

## 2014-09-17 NOTE — Assessment & Plan Note (Signed)
Her BP is well controlled Need to hold the CCB for now Will start demadex to treat the edema

## 2014-09-17 NOTE — Patient Instructions (Signed)

## 2014-09-17 NOTE — Progress Notes (Signed)
Pre visit review using our clinic review tool, if applicable. No additional management support is needed unless otherwise documented below in the visit note. 

## 2014-09-17 NOTE — Assessment & Plan Note (Signed)
She has good rate control Due to the edema will hold the CCB for a few weeks and will follow closely She is not willing to be anticoagulated

## 2014-09-18 ENCOUNTER — Encounter: Payer: Self-pay | Admitting: Internal Medicine

## 2014-09-18 LAB — D-DIMER, QUANTITATIVE (NOT AT ARMC): D DIMER QUANT: 0.28 ug{FEU}/mL (ref 0.00–0.48)

## 2014-09-30 ENCOUNTER — Other Ambulatory Visit: Payer: Self-pay | Admitting: Neurology

## 2014-09-30 ENCOUNTER — Encounter: Payer: Self-pay | Admitting: *Deleted

## 2014-10-01 NOTE — Telephone Encounter (Signed)
Rx signed and faxed.

## 2014-10-08 ENCOUNTER — Ambulatory Visit (INDEPENDENT_AMBULATORY_CARE_PROVIDER_SITE_OTHER): Payer: PPO | Admitting: Internal Medicine

## 2014-10-08 ENCOUNTER — Encounter: Payer: Self-pay | Admitting: Internal Medicine

## 2014-10-08 ENCOUNTER — Ambulatory Visit: Payer: PPO | Admitting: Internal Medicine

## 2014-10-08 ENCOUNTER — Other Ambulatory Visit: Payer: Self-pay | Admitting: Internal Medicine

## 2014-10-08 VITALS — BP 118/68 | HR 100 | Ht 64.0 in | Wt 119.5 lb

## 2014-10-08 DIAGNOSIS — K5190 Ulcerative colitis, unspecified, without complications: Secondary | ICD-10-CM | POA: Diagnosis not present

## 2014-10-08 DIAGNOSIS — R195 Other fecal abnormalities: Secondary | ICD-10-CM

## 2014-10-08 DIAGNOSIS — D649 Anemia, unspecified: Secondary | ICD-10-CM

## 2014-10-08 MED ORDER — CHOLESTYRAMINE 4 G PO PACK: 4.0000 g | PACK | Freq: Every evening | ORAL | Status: DC

## 2014-10-08 NOTE — Progress Notes (Signed)
Subjective:    Patient ID: [name], female    DOB: 1941-02-03, 74 y.o.   MRN: [id number]  HPI [name] is a 74 yo female with PMH of ulcerative colitis, history of C. difficile colitis, laryngeal cancer status post laryngectomy, hip and shoulder fracture complicated by right shoulder hardware infection who is seen for follow-up. She was last seen on 08/11/2014 with signs and symptoms of active colitis flare. C. difficile was ruled out and she was started on prednisone taper which she has now weaned down to 20 mg daily. She was also given Questran to help control loose stools. She reports she is feeling tremendously better. Bleeding and bloody diarrhea has stopped. No abdominal pain. Fecal urgency is much improved. Stools are now mostly formed and brown. Good appetite. No nausea or vomiting. No fevers or chills. He has continued to use mesalamine 1600 mg 3 times daily which she is getting from [place]. She was also treated for pneumonia after seeing me at her last visit.  Review of Systems As per history of present illness, otherwise negative  Current Medications, Allergies, Past Medical History, Past Surgical History, Family History and Social History were reviewed in [facility] record.     Objective:   Physical Exam BP 118/68 mmHg  Pulse 100  Ht 5\' 4"  (1.626 m)  Wt 119 lb 8 oz (54.205 kg)  BMI 20.50 kg/m2 Constitutional: Well-developed and well-nourished. No distress. HEENT: Normocephalic and atraumatic. Oropharynx is clear and moist. No oropharyngeal exudate. Conjunctivae are normal.  No scleral icterus. Neck: Stoma in place, well-healed scars Cardiovascular: Mildly tachycardic and irregular Pulmonary/chest: Effort normal and breath sounds normal. No wheezing, rales or rhonchi. Abdominal: Soft, nontender, nondistended. Bowel sounds active throughou Extremities: no clubbing, cyanosis, 1+ edema to the midshin Neurological: Alert and oriented to person  place and time. Skin: Skin is warm and dry. No rashes noted. Changes of venous stasis and varicose veins bilateral lower extremities Psychiatric: Normal mood and affect. Behavior is normal.  CBC    Component Value Date/Time   WBC 13.6* 09/17/2014 1420   RBC 4.53 09/17/2014 1420   RBC 2.91* 10/30/2011 0502   HGB 14.3 09/17/2014 1420   HCT 42.2 09/17/2014 1420   PLT 211.0 09/17/2014 1420   MCV 93.3 09/17/2014 1420   MCH 32.0 06/09/2014 0515   MCHC 33.8 09/17/2014 1420   RDW 18.7* 09/17/2014 1420   LYMPHSABS 1.3 09/17/2014 1420   MONOABS 0.4 09/17/2014 1420   EOSABS 0.0 09/17/2014 1420   BASOSABS 0.0 09/17/2014 1420    CMP     Component Value Date/Time   NA 133* 09/17/2014 1420   K 3.7 09/17/2014 1420   CL 94* 09/17/2014 1420   CO2 34* 09/17/2014 1420   GLUCOSE 87 09/17/2014 1420   BUN 15 09/17/2014 1420   CREATININE 0.61 09/17/2014 1420   CALCIUM 10.1 09/17/2014 1420   PROT 7.8 09/17/2014 1420   ALBUMIN 4.3 09/17/2014 1420   AST 15 09/17/2014 1420   ALT 23 09/17/2014 1420   ALKPHOS 133* 09/17/2014 1420   BILITOT 0.6 09/17/2014 1420   GFRNONAA >90 06/09/2014 0515   GFRAA >90 06/09/2014 0515        Assessment & Plan:  74 yo female with PMH of ulcerative colitis, history of C. difficile colitis, laryngeal cancer status post laryngectomy, hip and shoulder fracture complicated by right shoulder hardware infection who is seen for follow-up.  1. UC/anemia -- improved significantly with prednisone taper. They have been  decreasing by 5 mg every 14 days and I will accelerate the taper at this time. She will decrease to 15 mg daily 7 days and decrease by 5 mg until off. They're reminded to notify me should signs or symptoms of active colitis return as she tapers prednisone to off. She will continue mesalamine 1.6 g 3 times a day. I would also like her to continue cholestyramine 4 g once daily as it seems to have helped with fecal urgency and also help bulk inform the stool. She's  had no issues with constipation. Hemoglobin has improved which is reassuring. White count is slightly elevated likely secondary to prednisone and I expect this to improve as prednisone tapered off  2. Pneumonia -- resolved with antibiotics  Return in 6 months, sooner if necessary 25 minutes spent with the patient at least 50% when counseling

## 2014-10-08 NOTE — Patient Instructions (Signed)
Please continue Asacol  Dr Hilarie Fredrickson has advised that you be on a prednisone taper. The taper instructions are as follows: Prednisone 15 mg daily x 7 days Prednisone 10 mg daily x 7 days Prednisone 5 mg daily x 7 days Then, discontinue.  We have sent the following medications to your pharmacy for you to pick up at your convenience: Questran 1 packet daily  Please follow up with Dr Hilarie Fredrickson in 4 months.

## 2014-10-14 ENCOUNTER — Telehealth: Payer: Self-pay | Admitting: Internal Medicine

## 2014-10-14 NOTE — Telephone Encounter (Signed)
Spoke with pts husband and discussed with him that the [place] and synthroid should not be taken at the same time. Encouraged him to speak with their pharmacist for more detailed information regarding the timing of the medications. States he will try moving the questran to 4pm and keep the synthroid at bedtime that way there will be more than 4 hours between the meds.

## 2014-10-22 ENCOUNTER — Ambulatory Visit (INDEPENDENT_AMBULATORY_CARE_PROVIDER_SITE_OTHER): Payer: PPO | Admitting: Neurology

## 2014-10-22 ENCOUNTER — Encounter: Payer: Self-pay | Admitting: Neurology

## 2014-10-22 VITALS — BP 153/75 | HR 112 | Ht 65.0 in | Wt 130.0 lb

## 2014-10-22 DIAGNOSIS — R569 Unspecified convulsions: Secondary | ICD-10-CM | POA: Diagnosis not present

## 2014-10-22 MED ORDER — LEVETIRACETAM 750 MG PO TABS: 1500.0000 mg | ORAL_TABLET | Freq: Two times a day (BID) | ORAL | Status: DC

## 2014-10-22 NOTE — Progress Notes (Signed)
Reason for visit: Seizures  [name] is an 74 y.o. female  History of present illness:  [name] is a 74 year old left-handed white female with a history of seizures. The patient has done well with her seizure control, last seizure was approximately 2 years ago. The patient is on Keppra and phenobarbital, she is tolerating the medications well. The patient has had recent issues with pneumonia, ulcerative colitis, and cellulitis. The patient runs chronically low sodium levels, but recent blood work shows good stability with this. The patient has some difficulty with her walking, she denies any recent falls, she is had a right hip fracture and a fracture of her right shoulder.  Past Medical History  Diagnosis Date  . Peripheral vascular disease   . Subdural hematoma   . Mild dysplasia of cervix   . UTI (lower urinary tract infection)   . Urinary incontinence   . Cerebrovascular disease   . Personal history of colonic polyps     adenomatous 1997 & tubular adenoma and hyplastic  2008  . Hyperlipidemia     takes Pravastatin daily  . H/O alcohol abuse   . Diverticulosis of colon (without mention of hemorrhage)   . Redundant colon   . C. difficile colitis   . Dementia t  . Ulcerative colitis     takes [place] daily  . Dysrhythmia     a fib-takes Diltiazem daily  . Seizures     takes Phenobarbital and Keppra daily;last seizure was April 1,2014  . Stroke 2000    off balance on right side a little and peripheral vision loss on right side  . Arthritis   . Joint pain   . Joint swelling   . History of UTI 10-2012  . Hypothyroidism     takes Levothyroxine daily  . Squamous cell carcinoma of mouth   . Adenocarcinoma, breast     bilateral  . Anxiety   . Dementia   . Cancer of larynx   . Hypertension   . Atrial fibrillation   . History of breast cancer   . History of seizures   . Gait disorder   . Oropharyngeal cancer   . History of colitis   . Pneumonia   .  Cellulitis     Past Surgical History  Procedure Laterality Date  . Subdural hematoma evacuation via craniotomy    . Oral surgery for squamous cell carcinoma of the mouth      x 3  . Multiple tooth extractions      due to oral cancer  . Cataract extraction, bilateral    . Breast lumpectomy      left breast with radiation therapy  . Carotid endarterectomy      left  . Mastectomy      Right, history with nodule dissection  . Orif hip fracture  Sept '12    Right hip: screw and plate repair.  . Larynx surgery  08/2010    [facility]  . Tubal ligation    . Colonoscopy    . Orif humerus fracture Right 10/30/2012    Procedure: OPEN REDUCTION INTERNAL FIXATION (ORIF) HUMERAL SHAFT FRACTURE;  Surgeon: Hessie Dibble, MD;  Location: [place];  Service: Orthopedics;  Laterality: Right;  BIG C-ARM, SHOULDER POSITION  . Hammer toe surgery Right 03/2013    pins and screws  . Flexible sigmoidoscopy N/A 06/21/2013    Procedure: FLEXIBLE SIGMOIDOSCOPY;  Surgeon: Jerene Bears, MD;  Location: [facility];  Service: Gastroenterology;  Laterality: N/A;  . Hardware removal Right 11/14/2013    Procedure: RIGHT SHOUDER HARDWARE REMOVAL;  Surgeon: Nita Sells, MD;  Location: [place];  Service: Orthopedics;  Laterality: Right;  . Incision and drainage Right 11/14/2013    Procedure: INCISION AND DRAINAGE;  Surgeon: Nita Sells, MD;  Location: [place];  Service: Orthopedics;  Laterality: Right;  . Shoulder surgery  10/2013    Family History  Problem Relation Age of Onset  . Coronary artery disease Mother   . Heart disease Mother   . Diabetes Sister   . Breast cancer Maternal Aunt   . Breast cancer Sister   . Colon cancer      nephew (sister's son)    Social history:  reports that she quit smoking about 9 years ago. She has never used smokeless tobacco. She reports that she does not drink alcohol or use illicit drugs.   No Known Allergies  Medications:  Prior to Admission  medications   Medication Sig Start Date End Date Taking? Authorizing Provider  aspirin 81 MG tablet Take 81 mg by mouth daily.   Yes Historical Provider, MD  Calcium Carbonate-Vitamin D (CALCIUM 500 + D PO) Take 1 tablet by mouth daily.   Yes Historical Provider, MD  cholestyramine Lucrezia Starch) 4 G packet Take 1 packet (4 g total) by mouth every evening. 10/08/14  Yes Jerene Bears, MD  diltiazem (TIAZAC) 180 MG 24 hr capsule Take 180 mg by mouth daily.   Yes Historical Provider, MD  levETIRAcetam (KEPPRA) 750 MG tablet Take 2 tablets (1,500 mg total) by mouth 2 (two) times daily. 10/22/14  Yes Kathrynn Ducking, MD  levothyroxine (SYNTHROID, LEVOTHROID) 50 MCG tablet Take 1 tablet (50 mcg total) by mouth every evening. 06/17/14  Yes Janith Lima, MD  lidocaine (XYLOCAINE) 2 % solution Take 20 mLs by mouth every 3 (three) hours as needed for mouth pain. Oral pain   Yes Historical Provider, MD  Mesalamine (ASACOL HD) 800 MG TBEC Take 2 tablets (1,600 mg total) by mouth 3 (three) times daily. NEEDS OFFICE VISIT FOR FURTHER REFILLS 06/13/14  Yes Jerene Bears, MD  Multiple Vitamin (MULTIVITAMIN WITH MINERALS) TABS Take 1 tablet by mouth daily.   Yes Historical Provider, MD  PHENobarbital (LUMINAL) 97.2 MG tablet TAKE 1 TABLET BY MOUTH ONCE EVERY MORNING 10/01/14  Yes Kathrynn Ducking, MD  pravastatin (PRAVACHOL) 40 MG tablet Take 1 tablet (40 mg total) by mouth every evening. 06/17/14  Yes Janith Lima, MD  predniSONE (DELTASONE) 10 MG tablet Take 1 tablet (10 mg total) by mouth as directed. 08/11/14  Yes Jerene Bears, MD    ROS:  Out of a complete 14 system review of symptoms, the patient complains only of the following symptoms, and all other reviewed systems are negative.  Joint swelling, walking difficulty  Blood pressure 153/75, pulse 112, height 5\' 5"  (1.651 m), weight 130 lb (58.968 kg).  Physical Exam  General: The patient is alert and cooperative at the time of the  examination.  Respiratory: Lung fields are clear  Skin: 2+ edema below the knees is noted bilaterally.   Neurologic Exam  Mental status: The patient is alert and oriented x 3 at the time of the examination. The patient has apparent normal recent and remote memory, with an apparently normal attention span and concentration ability.   Cranial nerves: Facial symmetry is present. Speech is generated with an electrolarynx. Extraocular movements are full. Visual  fields are full.  Motor: The patient has good strength in all 4 extremities.  Sensory examination: Soft touch sensation is symmetric on the face, arms, and legs.  Coordination: The patient has good finger-nose-finger and heel-to-shin bilaterally.  Gait and station: The patient has a slightly wide-based, unsteady gait. Tandem gait was not attempted. Romberg is negative. No drift is seen.  Reflexes: Deep tendon reflexes are symmetric.   Assessment/Plan:  1. History of seizures  2. Chronic hyponatremia  3. Gait instability  The patient has done well with her seizures. She will continue on the current dose of Keppra and phenobarbital. She will follow-up in about one year. A prescription was written for the [place].  Jill Alexanders MD 10/22/2014 7:01 PM  [facility] [address] [place], [place] [address]  Phone [phone] Fax [phone]

## 2014-10-29 ENCOUNTER — Telehealth: Payer: Self-pay | Admitting: Internal Medicine

## 2014-10-29 ENCOUNTER — Ambulatory Visit (INDEPENDENT_AMBULATORY_CARE_PROVIDER_SITE_OTHER): Payer: PPO | Admitting: Internal Medicine

## 2014-10-29 ENCOUNTER — Encounter: Payer: Self-pay | Admitting: Internal Medicine

## 2014-10-29 VITALS — BP 124/70 | HR 110 | Temp 98.5°F | Resp 16 | Wt 128.0 lb

## 2014-10-29 DIAGNOSIS — I482 Chronic atrial fibrillation: Secondary | ICD-10-CM

## 2014-10-29 DIAGNOSIS — I4821 Permanent atrial fibrillation: Secondary | ICD-10-CM

## 2014-10-29 DIAGNOSIS — I10 Essential (primary) hypertension: Secondary | ICD-10-CM | POA: Diagnosis not present

## 2014-10-29 DIAGNOSIS — I480 Paroxysmal atrial fibrillation: Secondary | ICD-10-CM

## 2014-10-29 DIAGNOSIS — D72829 Elevated white blood cell count, unspecified: Secondary | ICD-10-CM

## 2014-10-29 DIAGNOSIS — R609 Edema, unspecified: Secondary | ICD-10-CM | POA: Diagnosis not present

## 2014-10-29 MED ORDER — DIGOXIN 125 MCG PO TABS: 125.0000 ug | ORAL_TABLET | Freq: Every day | ORAL | Status: DC

## 2014-10-29 MED ORDER — FUROSEMIDE 20 MG PO TABS
20.0000 mg | ORAL_TABLET | Freq: Every day | ORAL | Status: DC
Start: 2014-10-29 — End: 2015-03-06

## 2014-10-29 MED ORDER — RIVAROXABAN 20 MG PO TABS: 20.0000 mg | ORAL_TABLET | Freq: Every day | ORAL | Status: DC

## 2014-10-29 NOTE — Assessment & Plan Note (Signed)
She has permanent A fib with a slightly elevated VR She is not tolerating cardizem so will discontinue Will start digoxin to try to get better rate control Will refer to cardiology for further evaluation She needs to be anticoagulated - will start xarelto

## 2014-10-29 NOTE — Assessment & Plan Note (Signed)
This is related to the steroid use Will follow for now

## 2014-10-29 NOTE — Patient Instructions (Signed)

## 2014-10-29 NOTE — Assessment & Plan Note (Signed)
Her BP is well controlled Will need to discontinue the CCB due to edema

## 2014-10-29 NOTE — Progress Notes (Signed)
Subjective:    Patient ID: [name], female    DOB: 1940/12/11, 75 y.o.   MRN: [id number]  Hypertension This is a chronic problem. The current episode started more than 1 year ago. The problem is unchanged. The problem is controlled. Associated symptoms include palpitations and peripheral edema. Pertinent negatives include no anxiety, blurred vision, chest pain, headaches, malaise/fatigue, neck pain, orthopnea, PND, shortness of breath or sweats. There are no associated agents to hypertension. Past treatments include calcium channel blockers and diuretics. The current treatment provides significant improvement. Compliance problems include medication side effects.       Review of Systems  Constitutional: Negative.  Negative for fever, chills, malaise/fatigue, diaphoresis, appetite change and fatigue.  HENT: Negative.  Negative for nosebleeds and trouble swallowing.   Eyes: Negative.  Negative for blurred vision.  Respiratory: Negative.  Negative for cough, choking, shortness of breath and stridor.   Cardiovascular: Positive for palpitations and leg swelling. Negative for chest pain, orthopnea and PND.       She complains of edema in both legs, this was first reported a few months ago and she held the CCB for 2 weeks and she took a diuretic and the edema resolved but when she restarted the CCB the edema returned and it is bothersome.   Gastrointestinal: Negative.  Negative for nausea, abdominal pain, constipation, blood in stool and anal bleeding.  Endocrine: Negative.   Genitourinary: Negative.  Negative for dysuria and hematuria.  Musculoskeletal: Negative.  Negative for neck pain.  Skin: Negative.   Allergic/Immunologic: Negative.   Neurological: Negative.  Negative for headaches.  Hematological: Negative.  Negative for adenopathy. Does not bruise/bleed easily.  Psychiatric/Behavioral: Negative.        Objective:   Physical Exam  Constitutional: She is oriented to person,  place, and time. She appears well-developed and well-nourished. No distress.  HENT:  Head: Normocephalic and atraumatic.  Mouth/Throat: Oropharynx is clear and moist. No oropharyngeal exudate.  Eyes: Conjunctivae are normal. Right eye exhibits no discharge. Left eye exhibits no discharge. No scleral icterus.  Neck: Normal range of motion. Neck supple. No JVD present. No tracheal deviation present. No thyromegaly present.  Cardiovascular: Normal heart sounds and intact distal pulses.  An irregularly irregular rhythm present. Tachycardia present.  Exam reveals no gallop and no friction rub.   No murmur heard. Pulses:      Carotid pulses are 1+ on the right side, and 1+ on the left side.      Radial pulses are 1+ on the right side, and 1+ on the left side.       Femoral pulses are 1+ on the right side, and 1+ on the left side.      Popliteal pulses are 1+ on the right side, and 1+ on the left side.       Dorsalis pedis pulses are 1+ on the right side, and 1+ on the left side.       Posterior tibial pulses are 1+ on the right side, and 1+ on the left side.  Pulmonary/Chest: Effort normal and breath sounds normal. No stridor. No respiratory distress. She has no wheezes. She has no rales. She exhibits no tenderness.  Abdominal: Soft. Bowel sounds are normal. She exhibits no distension and no mass. There is no rebound and no guarding.  Musculoskeletal: Normal range of motion. She exhibits edema (2++ pitting edema in BLE). She exhibits no tenderness.  Lymphadenopathy:    She has no cervical adenopathy.  Neurological: She  is oriented to person, place, and time.  Skin: Skin is warm and dry. No rash noted. She is not diaphoretic. No erythema. No pallor.  Vitals reviewed.    Lab Results  Component Value Date   WBC 13.6* 09/17/2014   HGB 14.3 09/17/2014   HCT 42.2 09/17/2014   PLT 211.0 09/17/2014   GLUCOSE 87 09/17/2014   CHOL 165 02/10/2014   TRIG 57.0 02/10/2014   HDL 76.20 02/10/2014    LDLCALC 77 02/10/2014   ALT 23 09/17/2014   AST 15 09/17/2014   NA 133* 09/17/2014   K 3.7 09/17/2014   CL 94* 09/17/2014   CREATININE 0.61 09/17/2014   BUN 15 09/17/2014   CO2 34* 09/17/2014   TSH 0.98 09/17/2014   INR 1.26 03/18/2012   HGBA1C 5.7 02/10/2014       Assessment & Plan:

## 2014-10-29 NOTE — Telephone Encounter (Signed)
No - I don;t think that is a good option

## 2014-10-29 NOTE — Telephone Encounter (Signed)
°  Pt called in and wanted to know if pt could get Warfin or coumadin instead of rivaroxaban (XARELTO) 20 MG TABS tablet [[id number]] .  He said that the Warfin and Coumadin is tier 1 and Xarelto is tier 3 and much more expensive.

## 2014-10-29 NOTE — Assessment & Plan Note (Signed)
She will take an AM dose of lasix to treat the edema Will discontinue the CCB

## 2014-10-30 NOTE — Telephone Encounter (Signed)
Returned call back, no answer.

## 2014-10-31 NOTE — Telephone Encounter (Signed)
Patient is going to continue the aspirin and she will start the new medication that was prescribed

## 2014-11-07 ENCOUNTER — Telehealth: Payer: Self-pay | Admitting: Internal Medicine

## 2014-11-07 NOTE — Telephone Encounter (Signed)
Unfort needs to stop the xareleto  Cont all other meds  Needs ROV with Dr Ronnald Ramp mon may 9  To ER at any time for persistent bleeding, especially if assoc with worsening pain, fever, dizziness, weakness, cp or SOB, or other unusual symptoms

## 2014-11-07 NOTE — Telephone Encounter (Signed)
Patient started her rivaroxaban (XARELTO) 20 MG TABS tablet [[id number] and digoxin (LANOXIN) 0.125 MG tablet [[id number]. She is seeing Dr. Hilarie Fredrickson for her ulcerated colitis and he had her on prednisone and she has stopped taking it as of 04/13 because the bleeding has stopped. Now that she has started the xarelto and lanoxin, she was up all night with diarrhea and blood in stool. Wondering if this could be caused by the new medication. Please advise patient.

## 2014-11-07 NOTE — Telephone Encounter (Signed)
Is she having diarrhea, or red blood with mostly formed stools? She has hx of c diff and her husband has been very good at knowing in the past when this recurs.  Does he suspect c diff now? Given bleeding, would hold xarelto and continue asa daily.  If bleeding stops then can try xarelto again.   Await response from Dr. Ronnald Ramp Continue mesalamine

## 2014-11-07 NOTE — Telephone Encounter (Signed)
If diarrhea persists and is still present Monday, then check gi pathogen panel for completeness and rx: uceris 9 mg daily x 8 weeks

## 2014-11-07 NOTE — Telephone Encounter (Signed)
MD is out of the office until 11/17/14

## 2014-11-07 NOTE — Telephone Encounter (Signed)
Pt was seen by her PCP on the 27th. Husband states Dr. Ronnald Ramp was concerned about her A fib and he took her off of diltiazem and put her on digoxin and xerelto about 5 days ago. States Sunday he noticed there was some blood colored tinge to her stool and they were not as formed as normal. States this morning at 3:30am there was quite a bit of BRB in the stool. He called Dr. Ronnald Ramp office but he is out of the office until the 16th. Dr. Hilarie Fredrickson please advise.

## 2014-11-07 NOTE — Telephone Encounter (Signed)
Pts husband states that the stools are diarrhea and he does not think that she has c diff again. States she is getting some urgency back. He knows to hold the xerelto, continue the asa. Husband will resume xerelto if bleeding stops.

## 2014-11-07 NOTE — Telephone Encounter (Signed)
Left message to call back  

## 2014-11-10 MED ORDER — PREDNISONE 10 MG PO TABS: ORAL_TABLET | ORAL | Status: DC

## 2014-11-10 NOTE — Telephone Encounter (Signed)
Given difficulty of her getting uceris in the past (due to $), would recommend steroid taper for UC flare Prednisone 40 mg daily x 5 days, 30 mg daily x 5 day, 20 mg daily x 5 days, and 10 mg daily x 5 days Call if not improving after a few days on steroids

## 2014-11-10 NOTE — Telephone Encounter (Signed)
Patient husband notified and states he is working with Dr. Hilarie Fredrickson office

## 2014-11-10 NOTE — Telephone Encounter (Signed)
Pts husband states that pt is not having diarrhea today. She did have urgency at 6am and the stool was a pudding like consistency but he states there was a lot of blood present. Please advise.

## 2014-11-10 NOTE — Telephone Encounter (Signed)
Pts husband aware and script sent to pharmacy. 

## 2014-11-12 ENCOUNTER — Ambulatory Visit: Payer: Medicare Other | Admitting: Infectious Diseases

## 2014-12-03 ENCOUNTER — Ambulatory Visit (INDEPENDENT_AMBULATORY_CARE_PROVIDER_SITE_OTHER): Payer: PPO | Admitting: Internal Medicine

## 2014-12-03 ENCOUNTER — Encounter: Payer: Self-pay | Admitting: Infectious Diseases

## 2014-12-03 ENCOUNTER — Encounter: Payer: Self-pay | Admitting: Internal Medicine

## 2014-12-03 ENCOUNTER — Ambulatory Visit (INDEPENDENT_AMBULATORY_CARE_PROVIDER_SITE_OTHER): Payer: PPO | Admitting: Infectious Diseases

## 2014-12-03 VITALS — BP 111/71 | HR 92 | Temp 98.3°F | Ht 65.0 in | Wt 120.0 lb

## 2014-12-03 VITALS — BP 110/70 | HR 88 | Ht 65.0 in | Wt 121.6 lb

## 2014-12-03 DIAGNOSIS — I4819 Other persistent atrial fibrillation: Secondary | ICD-10-CM

## 2014-12-03 DIAGNOSIS — I482 Chronic atrial fibrillation, unspecified: Secondary | ICD-10-CM

## 2014-12-03 DIAGNOSIS — T847XXD Infection and inflammatory reaction due to other internal orthopedic prosthetic devices, implants and grafts, subsequent encounter: Secondary | ICD-10-CM

## 2014-12-03 DIAGNOSIS — I481 Persistent atrial fibrillation: Secondary | ICD-10-CM | POA: Diagnosis not present

## 2014-12-03 DIAGNOSIS — I10 Essential (primary) hypertension: Secondary | ICD-10-CM | POA: Diagnosis not present

## 2014-12-03 NOTE — Progress Notes (Signed)
Electrophysiology Office Note   Date:  12/03/2014   ID:  [name], DOB Apr 01, 1941, MRN [id number]  PCP:  Scarlette Calico, MD   Primary Electrophysiologist: Thompson Grayer, MD    Chief Complaint  Patient presents with  . Permanent AFIB     History of Present Illness: [name] is a 74 y.o. female who presents today for electrophysiology evaluation.   She presents today for consultation regarding afib.  She has had multiple falls with multiple fractures which affect her gate.  She has had prior SDH hematoma evacuated in the 1990s following a fall.  She has also had a prior stroke.  She recently tried xarelto but was unable to tolerate this due to GI bleeding.  She has not fallen recently. She was previously treated with diltiazem.  This was recently stopped due to edema. Today, she denies symptoms of palpitations, chest pain, shortness of breath, orthopnea, PND, lower extremity edema, claudication, dizziness, presyncope, syncope, bleeding, or neurologic sequela. The patient is tolerating medications without difficulties and is otherwise without complaint today.    Past Medical History  Diagnosis Date  . Peripheral vascular disease   . Subdural hematoma 1990s    s/p fall in bathtub  . Mild dysplasia of cervix   . UTI (lower urinary tract infection)   . Urinary incontinence   . Cerebrovascular disease 2000  . Personal history of colonic polyps     adenomatous 1997 & tubular adenoma and hyplastic  2008  . Hyperlipidemia     takes Pravastatin daily  . H/O alcohol abuse   . Diverticulosis of colon (without mention of hemorrhage)   . Redundant colon   . C. difficile colitis   . Ulcerative colitis     takes [place] daily  . Permanent atrial fibrillation   . Seizures     takes Phenobarbital and Keppra daily;last seizure was April 1,2014  . Stroke 2000    off balance on right side a little and peripheral vision loss on right side  . Arthritis   . Joint pain   . Joint  swelling   . History of UTI 10-2012  . Hypothyroidism     takes Levothyroxine daily  . Squamous cell carcinoma of mouth   . Adenocarcinoma, breast     bilateral  . Anxiety   . Dementia   . Cancer of larynx   . Hypertension   . Gait disorder   . Oropharyngeal cancer   . Pneumonia 2/16  . Cellulitis    Past Surgical History  Procedure Laterality Date  . Subdural hematoma evacuation via craniotomy    . Oral surgery for squamous cell carcinoma of the mouth      x 3  . Multiple tooth extractions      due to oral cancer  . Cataract extraction, bilateral    . Breast lumpectomy      left breast with radiation therapy  . Carotid endarterectomy      left  . Mastectomy      Right, history with nodule dissection  . Orif hip fracture  Sept '12    Right hip: screw and plate repair.  . Larynx surgery  08/2010    [facility]  . Tubal ligation    . Colonoscopy    . Orif humerus fracture Right 10/30/2012    Procedure: OPEN REDUCTION INTERNAL FIXATION (ORIF) HUMERAL SHAFT FRACTURE;  Surgeon: Hessie Dibble, MD;  Location: [place];  Service: Orthopedics;  Laterality: Right;  BIG C-ARM, SHOULDER POSITION  . Hammer toe surgery Right 03/2013    pins and screws  . Flexible sigmoidoscopy N/A 06/21/2013    Procedure: FLEXIBLE SIGMOIDOSCOPY;  Surgeon: Jerene Bears, MD;  Location: [facility];  Service: Gastroenterology;  Laterality: N/A;  . Hardware removal Right 11/14/2013    Procedure: RIGHT SHOUDER HARDWARE REMOVAL;  Surgeon: Nita Sells, MD;  Location: [place];  Service: Orthopedics;  Laterality: Right;  . Incision and drainage Right 11/14/2013    Procedure: INCISION AND DRAINAGE;  Surgeon: Nita Sells, MD;  Location: [place];  Service: Orthopedics;  Laterality: Right;  . Shoulder surgery  10/2013     Current Outpatient Prescriptions  Medication Sig Dispense Refill  . aspirin 81 MG tablet Take 81 mg by mouth daily.    . Calcium Carbonate-Vitamin D (CALCIUM 500 + D PO) Take  1 tablet by mouth daily.    . cholestyramine (QUESTRAN) 4 G packet Take 1 packet (4 g total) by mouth every evening. 30 each 2  . digoxin (LANOXIN) 0.125 MG tablet Take 1 tablet (125 mcg total) by mouth daily. 90 tablet 1  . furosemide (LASIX) 20 MG tablet Take 1 tablet (20 mg total) by mouth daily. 30 tablet 5  . levETIRAcetam (KEPPRA) 750 MG tablet Take 2 tablets (1,500 mg total) by mouth 2 (two) times daily. 360 tablet 3  . levothyroxine (SYNTHROID, LEVOTHROID) 50 MCG tablet Take 1 tablet (50 mcg total) by mouth every evening. 90 tablet 3  . lidocaine (XYLOCAINE) 2 % solution Take 20 mLs by mouth every 3 (three) hours as needed for mouth pain. Oral pain    . Mesalamine (ASACOL HD) 800 MG TBEC Take 2 tablets (1,600 mg total) by mouth 3 (three) times daily. NEEDS OFFICE VISIT FOR FURTHER REFILLS 540 tablet 0  . Multiple Vitamin (MULTIVITAMIN WITH MINERALS) TABS Take 1 tablet by mouth daily.    Marland Kitchen PHENobarbital (LUMINAL) 97.2 MG tablet TAKE 1 TABLET BY MOUTH ONCE EVERY MORNING 90 tablet 1  . pravastatin (PRAVACHOL) 40 MG tablet Take 1 tablet (40 mg total) by mouth every evening. 90 tablet 1  . predniSONE (DELTASONE) 10 MG tablet Take 4 tablets by mouth for 5 days Take 3 tablets by mouth for 5 days Take 2 tablets by mouth for 5 days Take 1 tablet by mouth for 5 days 50 tablet 0   No current facility-administered medications for this visit.    Allergies:   Xarelto and Diltiazem   Social History:  The patient  reports that she quit smoking about 10 years ago. She has never used smokeless tobacco. She reports that she drinks alcohol. She reports that she does not use illicit drugs.   Family History:  The patient's  family history includes Breast cancer in her maternal aunt and sister; Colon cancer in an other family member; Coronary artery disease in her mother; Diabetes in her sister; Heart disease in her mother.    ROS:  Please see the history of present illness.   All other systems are  reviewed and negative.    PHYSICAL EXAM: VS:  BP 110/70 mmHg  Pulse 88  Ht 5\' 5"  (1.651 m)  Wt 55.157 kg (121 lb 9.6 oz)  BMI 20.24 kg/m2 , BMI Body mass index is 20.24 kg/(m^2). GEN: Well nourished, well developed, in no acute distress HEENT: normal Neck: no JVD, carotid bruits, or masses Cardiac: iRRR; no murmurs, rubs, or gallops,no edema  Respiratory:  clear to auscultation bilaterally, normal work  of breathing GI: soft, nontender, nondistended, + BS MS: diffuse muscle atrophy Skin: warm and dry  Neuro:  Strength and sensation are intact Psych: euthymic mood, full affect  EKG:  EKG is ordered today. The ekg ordered today shows rate controlled afib   Recent Labs: 06/05/2014: Magnesium 2.2 09/17/2014: ALT 23; BUN 15; Creatinine 0.61; Hemoglobin 14.3; Platelets 211.0; Potassium 3.7; Pro B Natriuretic peptide (BNP) 180.0*; Sodium 133*; TSH 0.98    Lipid Panel     Component Value Date/Time   CHOL 165 02/10/2014 1439   TRIG 57.0 02/10/2014 1439   HDL 76.20 02/10/2014 1439   CHOLHDL 2 02/10/2014 1439   VLDL 11.4 02/10/2014 1439   LDLCALC 77 02/10/2014 1439     Wt Readings from Last 3 Encounters:  12/03/14 55.157 kg (121 lb 9.6 oz)  10/29/14 58.06 kg (128 lb)  10/22/14 58.968 kg (130 lb)      Other studies Reviewed: Additional studies/ records that were reviewed today include: Dr Ronnald Ramp' notes, echo 2013   ASSESSMENT AND PLAN:  1.  Permanent atrial fibrillation She tolerates her afib well and is rate controlled I am therefore reluctant to make changes at this time.  Check dig level.  Echo to evaluate for structural changes related to afib. chads2vasc score is 7.  She is not a candidate for anticoagulation given prior SDH and frequent falls. Today we discussed left atrial appendage occlusion ([place]) as an alternative to anticoagulation.  The patient would be willing to consider this option once it is available.  2. HTN Stable No change required  today echo  Current medicines are reviewed at length with the patient today.   The patient does not have concerns regarding her medicines.  The following changes were made today:  none  Follow-up in the AF clinic in 1 year   Signed, Thompson Grayer, MD  12/03/2014 9:47 AM     [facility] [address] [place]  [id number] [phone] (office) [phone] (fax)

## 2014-12-03 NOTE — Progress Notes (Signed)
   Subjective:    Patient ID: [name], female    DOB: June 28, 1941, 74 y.o.   MRN: [id number]  HPI 74 yo F who in May 2014 underwent ORIF of right proximal humerus fracture and developed hematoma and abscess postoperatively. Initially responded to oral antibiotics after growing Enterococcus, and then in May 2015 had a repeat infection requiring hardware removal by Dr. Tamera Punt on 11/14/2013. She was treated with IV vancomycin through June 25th and then oral amoxicillin. Several days after starting oral amoxicillin, she developed significant diarrhea and was positive C diff PCR. She was treated with po vancomycin and stopped amoxicillin. She completed this and was again challenged with po amoxil (8-4) and again developed C diff. She was seen in f/u 9-23 and was not started back on anbx.   In February she was treated with 7 days of levaquin for pneumonia.  She was seen by GI 08-21-14 with loose BM, felt to have UC flare and  wastreated with prednisone, questran. Her C diff was (-) and her CRP was 38.6.  She still has some pain in her R arm. Worse with activity.  No fever or chills. No swelling in her arm.   Review of Systems  Constitutional: Negative for fever and chills.  Gastrointestinal: Negative for constipation.  Musculoskeletal: Positive for arthralgias.       Objective:   Physical Exam  Constitutional: She appears well-developed and well-nourished.  Eyes: EOM are normal. Pupils are equal, round, and reactive to light.  Cardiovascular: Normal rate, regular rhythm and normal heart sounds.   Pulmonary/Chest: Effort normal and breath sounds normal.  Abdominal: Soft. Bowel sounds are normal. There is no tenderness. There is no rebound.  Musculoskeletal:       Arms:      Assessment & Plan:

## 2014-12-03 NOTE — Assessment & Plan Note (Signed)
She appears to be doing well, off anbx.  She has an elevated CRP but I suspect this is to her multiple other medical issues.  Will see her back as needed.

## 2014-12-03 NOTE — Patient Instructions (Addendum)
Medication Instructions:  Your physician recommends that you continue on your current medications as directed. Please refer to the Current Medication list given to you today.   Labwork: Your physician recommends that you return for lab work today: Digoxin level   Testing/Procedures: Your physician has requested that you have an echocardiogram. Echocardiography is a painless test that uses sound waves to create images of your heart. It provides your doctor with information about the size and shape of your heart and how well your heart's chambers and valves are working. This procedure takes approximately one hour. There are no restrictions for this procedure.    Follow-Up: Your physician wants you to follow-up in: 12 months with Roderic Palau, NP You will receive a reminder letter in the mail two months in advance. If you don't receive a letter, please call our office to schedule the follow-up appointment.   Any Other Special Instructions Will Be Listed Below (If Applicable).  Left Atrial Appendage

## 2014-12-04 LAB — DIGOXIN LEVEL: Digoxin Level: 0.7 ng/mL — ABNORMAL LOW (ref 0.8–2.0)

## 2014-12-10 ENCOUNTER — Ambulatory Visit (HOSPITAL_COMMUNITY): Payer: PPO | Attending: Cardiology

## 2014-12-10 ENCOUNTER — Other Ambulatory Visit: Payer: Self-pay

## 2014-12-10 DIAGNOSIS — I481 Persistent atrial fibrillation: Secondary | ICD-10-CM | POA: Diagnosis not present

## 2014-12-10 DIAGNOSIS — I4891 Unspecified atrial fibrillation: Secondary | ICD-10-CM | POA: Diagnosis present

## 2014-12-10 DIAGNOSIS — I071 Rheumatic tricuspid insufficiency: Secondary | ICD-10-CM | POA: Diagnosis not present

## 2014-12-10 DIAGNOSIS — I4819 Other persistent atrial fibrillation: Secondary | ICD-10-CM

## 2014-12-14 ENCOUNTER — Emergency Department (HOSPITAL_COMMUNITY)
Admission: EM | Admit: 2014-12-14 | Discharge: 2014-12-14 | Disposition: A | Discharge status: Home / Self Care | Payer: PPO | Attending: Emergency Medicine | Admitting: Emergency Medicine

## 2014-12-14 ENCOUNTER — Encounter (HOSPITAL_COMMUNITY): Payer: Self-pay

## 2014-12-14 ENCOUNTER — Emergency Department (HOSPITAL_COMMUNITY): Payer: PPO

## 2014-12-14 DIAGNOSIS — Z8619 Personal history of other infectious and parasitic diseases: Secondary | ICD-10-CM | POA: Insufficient documentation

## 2014-12-14 DIAGNOSIS — Z85828 Personal history of other malignant neoplasm of skin: Secondary | ICD-10-CM | POA: Insufficient documentation

## 2014-12-14 DIAGNOSIS — S0990XA Unspecified injury of head, initial encounter: Secondary | ICD-10-CM | POA: Diagnosis present

## 2014-12-14 DIAGNOSIS — Z760 Encounter for issue of repeat prescription: Secondary | ICD-10-CM | POA: Insufficient documentation

## 2014-12-14 DIAGNOSIS — F0390 Unspecified dementia without behavioral disturbance: Secondary | ICD-10-CM | POA: Diagnosis not present

## 2014-12-14 DIAGNOSIS — I482 Chronic atrial fibrillation: Secondary | ICD-10-CM | POA: Insufficient documentation

## 2014-12-14 DIAGNOSIS — Z8742 Personal history of other diseases of the female genital tract: Secondary | ICD-10-CM | POA: Insufficient documentation

## 2014-12-14 DIAGNOSIS — Z87440 Personal history of urinary (tract) infections: Secondary | ICD-10-CM | POA: Diagnosis not present

## 2014-12-14 DIAGNOSIS — Y998 Other external cause status: Secondary | ICD-10-CM | POA: Insufficient documentation

## 2014-12-14 DIAGNOSIS — Z8521 Personal history of malignant neoplasm of larynx: Secondary | ICD-10-CM | POA: Diagnosis not present

## 2014-12-14 DIAGNOSIS — Z86010 Personal history of colonic polyps: Secondary | ICD-10-CM | POA: Insufficient documentation

## 2014-12-14 DIAGNOSIS — W01198A Fall on same level from slipping, tripping and stumbling with subsequent striking against other object, initial encounter: Secondary | ICD-10-CM | POA: Diagnosis not present

## 2014-12-14 DIAGNOSIS — Z872 Personal history of diseases of the skin and subcutaneous tissue: Secondary | ICD-10-CM | POA: Diagnosis not present

## 2014-12-14 DIAGNOSIS — S0101XA Laceration without foreign body of scalp, initial encounter: Secondary | ICD-10-CM | POA: Diagnosis not present

## 2014-12-14 DIAGNOSIS — M1990 Unspecified osteoarthritis, unspecified site: Secondary | ICD-10-CM | POA: Insufficient documentation

## 2014-12-14 DIAGNOSIS — W19XXXA Unspecified fall, initial encounter: Secondary | ICD-10-CM

## 2014-12-14 DIAGNOSIS — Z8701 Personal history of pneumonia (recurrent): Secondary | ICD-10-CM | POA: Diagnosis not present

## 2014-12-14 DIAGNOSIS — Z853 Personal history of malignant neoplasm of breast: Secondary | ICD-10-CM | POA: Insufficient documentation

## 2014-12-14 DIAGNOSIS — Z87738 Personal history of other specified (corrected) congenital malformations of digestive system: Secondary | ICD-10-CM | POA: Diagnosis not present

## 2014-12-14 DIAGNOSIS — Z9013 Acquired absence of bilateral breasts and nipples: Secondary | ICD-10-CM | POA: Diagnosis not present

## 2014-12-14 DIAGNOSIS — I10 Essential (primary) hypertension: Secondary | ICD-10-CM | POA: Insufficient documentation

## 2014-12-14 DIAGNOSIS — Z8659 Personal history of other mental and behavioral disorders: Secondary | ICD-10-CM | POA: Diagnosis not present

## 2014-12-14 DIAGNOSIS — Z87891 Personal history of nicotine dependence: Secondary | ICD-10-CM | POA: Insufficient documentation

## 2014-12-14 DIAGNOSIS — E039 Hypothyroidism, unspecified: Secondary | ICD-10-CM | POA: Diagnosis not present

## 2014-12-14 DIAGNOSIS — Z7982 Long term (current) use of aspirin: Secondary | ICD-10-CM | POA: Insufficient documentation

## 2014-12-14 DIAGNOSIS — G8929 Other chronic pain: Secondary | ICD-10-CM | POA: Insufficient documentation

## 2014-12-14 DIAGNOSIS — R2689 Other abnormalities of gait and mobility: Secondary | ICD-10-CM | POA: Insufficient documentation

## 2014-12-14 DIAGNOSIS — Z8673 Personal history of transient ischemic attack (TIA), and cerebral infarction without residual deficits: Secondary | ICD-10-CM | POA: Insufficient documentation

## 2014-12-14 DIAGNOSIS — G40919 Epilepsy, unspecified, intractable, without status epilepticus: Secondary | ICD-10-CM | POA: Insufficient documentation

## 2014-12-14 DIAGNOSIS — Y9289 Other specified places as the place of occurrence of the external cause: Secondary | ICD-10-CM | POA: Diagnosis not present

## 2014-12-14 DIAGNOSIS — Z79899 Other long term (current) drug therapy: Secondary | ICD-10-CM | POA: Diagnosis not present

## 2014-12-14 DIAGNOSIS — Y9389 Activity, other specified: Secondary | ICD-10-CM | POA: Diagnosis not present

## 2014-12-14 DIAGNOSIS — Z7952 Long term (current) use of systemic steroids: Secondary | ICD-10-CM | POA: Diagnosis not present

## 2014-12-14 DIAGNOSIS — Z8719 Personal history of other diseases of the digestive system: Secondary | ICD-10-CM | POA: Diagnosis not present

## 2014-12-14 DIAGNOSIS — E785 Hyperlipidemia, unspecified: Secondary | ICD-10-CM | POA: Diagnosis not present

## 2014-12-14 MED ORDER — LIDOCAINE-EPINEPHRINE 2 %-1:100000 IJ SOLN
10.0000 mL | Freq: Once | INTRAMUSCULAR | Status: AC
  Administered 2014-12-14: 10 mL via INTRADERMAL
  Filled 2014-12-14: qty 1

## 2014-12-14 MED ORDER — HYDROCODONE-ACETAMINOPHEN 5-325 MG PO TABS: 1.0000 | ORAL_TABLET | Freq: Four times a day (QID) | ORAL | Status: DC | PRN

## 2014-12-14 NOTE — ED Notes (Addendum)
Pt with head lac with controlled bleeding noted. Pt denies LOC, visual disturbances, lightheadedness/dizziness and headache.

## 2014-12-14 NOTE — ED Notes (Signed)
Patient was standing, trying to pulling the chain from a ceiling fan and lost her balance and felling hitting her head on a wooden step. Patient has a laceration to the back of the head. Patient denies blurred vision or N/V.

## 2014-12-14 NOTE — ED Notes (Signed)
MD/PA at bedside to suture lac. Pt tolerated well.

## 2014-12-14 NOTE — Discharge Instructions (Signed)
Please have staples removed in 5-7 days by your doctor.  Head Injury You have a head injury. Headaches and throwing up (vomiting) are common after a head injury. It should be easy to wake up from sleeping. Sometimes you must stay in the hospital. Most problems happen within the first 24 hours. Side effects may occur up to 7-10 days after the injury.  WHAT ARE THE TYPES OF HEAD INJURIES? Head injuries can be as minor as a bump. Some head injuries can be more severe. More severe head injuries include:  A jarring injury to the brain (concussion).  A bruise of the brain (contusion). This mean there is bleeding in the brain that can cause swelling.  A cracked skull (skull fracture).  Bleeding in the brain that collects, clots, and forms a bump (hematoma). WHEN SHOULD I GET HELP RIGHT AWAY?   You are confused or sleepy.  You cannot be woken up.  You feel sick to your stomach (nauseous) or keep throwing up (vomiting).  Your dizziness or unsteadiness is getting worse.  You have very bad, lasting headaches that are not helped by medicine. Take medicines only as told by your doctor.  You cannot use your arms or legs like normal.  You cannot walk.  You notice changes in the black spots in the center of the colored part of your eye (pupil).  You have clear or bloody fluid coming from your nose or ears.  You have trouble seeing. During the next 24 hours after the injury, you must stay with someone who can watch you. This person should get help right away (call 911 in the [place] if you start to shake and are not able to control it (have seizures), you pass out, or you are unable to wake up. HOW CAN I PREVENT A HEAD INJURY IN THE FUTURE?  Wear seat belts.  Wear a helmet while bike riding and playing sports like football.  Stay away from dangerous activities around the house. WHEN CAN I RETURN TO NORMAL ACTIVITIES AND ATHLETICS? See your doctor before doing these activities. You should not  do normal activities or play contact sports until 1 week after the following symptoms have stopped:  Headache that does not go away.  Dizziness.  Poor attention.  Confusion.  Memory problems.  Sickness to your stomach or throwing up.  Tiredness.  Fussiness.  Bothered by bright lights or loud noises.  Anxiousness or depression.  Restless sleep. MAKE SURE YOU:   Understand these instructions.  Will watch your condition.  Will get help right away if you are not doing well or get worse. Document Released: 06/02/2008 Document Revised: 11/04/2013 Document Reviewed: 02/25/2013 [facility] Patient Information 2015 [place], [place]. This information is not intended to replace advice given to you by your health care provider. Make sure you discuss any questions you have with your health care provider.  Stitches, Staples, or Skin Adhesive Strips  Stitches (sutures), staples, and skin adhesive strips hold the skin together as it heals. They will usually be in place for 7 days or less. HOME CARE  Wash your hands with soap and water before and after you touch your wound.  Only take medicine as told by your doctor.  Cover your wound only if your doctor told you to. Otherwise, leave it open to air.  Do not get your stitches wet or dirty. If they get dirty, dab them gently with a clean washcloth. Wet the washcloth with soapy water. Do not rub. Pat them dry  gently.  Do not put medicine or medicated cream on your stitches unless your doctor told you to.  Do not take out your own stitches or staples. Skin adhesive strips will fall off by themselves.  Do not pick at the wound. Picking can cause an infection.  Do not miss your follow-up appointment.  If you have problems or questions, call your doctor. GET HELP RIGHT AWAY IF:   You have a temperature by mouth above 102 F (38.9 C), not controlled by medicine.  You have chills.  You have redness or pain around your  stitches.  There is puffiness (swelling) around your stitches.  You notice fluid (drainage) from your stitches.  There is a bad smell coming from your wound. MAKE SURE YOU:  Understand these instructions.  Will watch your condition.  Will get help if you are not doing well or get worse. Document Released: 04/17/2009 Document Revised: 09/12/2011 Document Reviewed: 04/17/2009 [facility] Patient Information 2015 [place], [place]. This information is not intended to replace advice given to you by your health care provider. Make sure you discuss any questions you have with your health care provider.

## 2014-12-14 NOTE — ED Provider Notes (Signed)
The patient is a 74 year old female with a history of multiple cancers in the past, laryngeal cancer requiring tracheostomy, she states that she lost her balance and had a mechanical fall falling backwards and striking the back of her head on a wooden board, she was able to get off the ground, cleaning up the blood and wait for her family member to come home. She has minimal tenderness around the laceration, it is approximately 70 m in length, there is no foreign bodies in the wound, there is no exposed bone or galea, she has normal mental status is moving all 4 extremities. X-rays show no signs of acute cranial injury or bleeding, no fractures, no spinal fractures, patient informed of her results, the wound will be closed by Mr. Rona Ravens, please see his separate documentation for laceration repair.  Medical screening examination/treatment/procedure(s) were conducted as a shared visit with non-physician practitioner(s) and myself.  I personally evaluated the patient during the encounter.  Clinical Impression:   Final diagnoses:  Fall  Scalp laceration, initial encounter         Noemi Chapel, MD 12/15/14 1210

## 2014-12-14 NOTE — ED Provider Notes (Signed)
CSN: [id number]     Arrival date & time 12/14/14  1326 History   First MD Initiated Contact with Patient 12/14/14 1500     Chief Complaint  Patient presents with  . Fall  . Head Laceration     (Consider location/radiation/quality/duration/timing/severity/associated sxs/prior Treatment) HPI   74 year old female presents for evaluation of head laceration. Patient has a permanent trach, using voice assisted machine to talk.  Hx obtain from husband at bedside.  Patient has permanent atrial fibrillation currently not on any anticoagulation. She also has trouble with her gait. Approximately 3 hours ago she reportedly trying to pulled a chain from the ceiling fan, loss of balance and fell backwards striking her head against a wooden step on the floor. She denies any loss of consciousness, was able to get up, wipes the blood from the floor and then sat on the couch. Husband brought her to the ED for further care. At this time patient denies having any significant discomfort. No complaints of headache, neck pain, chest pain, trouble breathing, no precipitating symptoms. Denies any pain to her extremities. She is up-to-date with her tetanus.   Past Medical History  Diagnosis Date  . Peripheral vascular disease   . Subdural hematoma 1990s    s/p fall in bathtub  . Mild dysplasia of cervix   . UTI (lower urinary tract infection)   . Urinary incontinence   . Cerebrovascular disease 2000  . Personal history of colonic polyps     adenomatous 1997 & tubular adenoma and hyplastic  2008  . Hyperlipidemia     takes Pravastatin daily  . H/O alcohol abuse   . Diverticulosis of colon (without mention of hemorrhage)   . Redundant colon   . C. difficile colitis   . Ulcerative colitis     takes [place] daily  . Permanent atrial fibrillation   . Seizures     takes Phenobarbital and Keppra daily;last seizure was April 1,2014  . Stroke 2000    off balance on right side a little and peripheral vision loss on  right side  . Arthritis   . Joint pain   . Joint swelling   . History of UTI 10-2012  . Hypothyroidism     takes Levothyroxine daily  . Squamous cell carcinoma of mouth   . Adenocarcinoma, breast     bilateral  . Anxiety   . Dementia   . Cancer of larynx   . Hypertension   . Gait disorder   . Oropharyngeal cancer   . Pneumonia 2/16  . Cellulitis    Past Surgical History  Procedure Laterality Date  . Subdural hematoma evacuation via craniotomy    . Oral surgery for squamous cell carcinoma of the mouth      x 3  . Multiple tooth extractions      due to oral cancer  . Cataract extraction, bilateral    . Breast lumpectomy      left breast with radiation therapy  . Carotid endarterectomy      left  . Mastectomy      Right, history with nodule dissection  . Orif hip fracture  Sept '12    Right hip: screw and plate repair.  . Larynx surgery  08/2010    [facility] x 2  . Tubal ligation    . Colonoscopy    . Orif humerus fracture Right 10/30/2012    Procedure: OPEN REDUCTION INTERNAL FIXATION (ORIF) HUMERAL SHAFT FRACTURE;  Surgeon: Hessie Dibble, MD;  Location: [place];  Service: Orthopedics;  Laterality: Right;  BIG C-ARM, SHOULDER POSITION  . Hammer toe surgery Right 03/2013    pins and screws  . Flexible sigmoidoscopy N/A 06/21/2013    Procedure: FLEXIBLE SIGMOIDOSCOPY;  Surgeon: Jerene Bears, MD;  Location: [facility];  Service: Gastroenterology;  Laterality: N/A;  . Hardware removal Right 11/14/2013    Procedure: RIGHT SHOUDER HARDWARE REMOVAL;  Surgeon: Nita Sells, MD;  Location: [place];  Service: Orthopedics;  Laterality: Right;  . Incision and drainage Right 11/14/2013    Procedure: INCISION AND DRAINAGE;  Surgeon: Nita Sells, MD;  Location: [place];  Service: Orthopedics;  Laterality: Right;  . Shoulder surgery  10/2013   Family History  Problem Relation Age of Onset  . Coronary artery disease Mother   . Heart disease Mother   . Diabetes Sister    . Breast cancer Maternal Aunt   . Breast cancer Sister   . Colon cancer      nephew (sister's son)   History  Substance Use Topics  . Smoking status: Former Smoker -- 73 years    Quit date: 10/26/2004  . Smokeless tobacco: Never Used     Comment: quit in 2007  . Alcohol Use: 0.0 oz/week    0 Standard drinks or equivalent per week     Comment: normally 1 glass wine after dinner - occasional   OB History    No data available     Review of Systems  All other systems reviewed and are negative.     Allergies  Xarelto and Diltiazem  Home Medications   Prior to Admission medications   Medication Sig Start Date End Date Taking? Authorizing Provider  aspirin 81 MG tablet Take 81 mg by mouth daily.   Yes Historical Provider, MD  Calcium Carbonate-Vitamin D (CALCIUM 500 + D PO) Take 1 tablet by mouth daily.   Yes Historical Provider, MD  cholestyramine Lucrezia Starch) 4 G packet Take 1 packet (4 g total) by mouth every evening. 10/08/14  Yes Jerene Bears, MD  digoxin (LANOXIN) 0.125 MG tablet Take 1 tablet (125 mcg total) by mouth daily. 10/29/14  Yes Janith Lima, MD  furosemide (LASIX) 20 MG tablet Take 1 tablet (20 mg total) by mouth daily. 10/29/14  Yes Janith Lima, MD  levETIRAcetam (KEPPRA) 750 MG tablet Take 2 tablets (1,500 mg total) by mouth 2 (two) times daily. 10/22/14  Yes Kathrynn Ducking, MD  levothyroxine (SYNTHROID, LEVOTHROID) 50 MCG tablet Take 1 tablet (50 mcg total) by mouth every evening. 06/17/14  Yes Janith Lima, MD  lidocaine (XYLOCAINE) 2 % solution Take 20 mLs by mouth every 3 (three) hours as needed for mouth pain. Oral pain   Yes Historical Provider, MD  Mesalamine (ASACOL HD) 800 MG TBEC Take 2 tablets (1,600 mg total) by mouth 3 (three) times daily. NEEDS OFFICE VISIT FOR FURTHER REFILLS 06/13/14  Yes Jerene Bears, MD  Multiple Vitamin (MULTIVITAMIN WITH MINERALS) TABS Take 1 tablet by mouth daily.   Yes Historical Provider, MD  PHENobarbital (LUMINAL)  97.2 MG tablet TAKE 1 TABLET BY MOUTH ONCE EVERY MORNING 10/01/14  Yes Kathrynn Ducking, MD  pravastatin (PRAVACHOL) 40 MG tablet Take 1 tablet (40 mg total) by mouth every evening. 06/17/14  Yes Janith Lima, MD  predniSONE (DELTASONE) 10 MG tablet Take 4 tablets by mouth for 5 days Take 3 tablets by mouth for 5 days Take 2 tablets by mouth for  5 days Take 1 tablet by mouth for 5 days Patient not taking: Reported on 12/14/2014 11/10/14   Jerene Bears, MD   BP 107/72 mmHg  Pulse 87  Temp(Src) 97.7 F (36.5 C) (Oral)  Resp 18  SpO2 95% Physical Exam  Constitutional: She is oriented to person, place, and time. She appears well-developed and well-nourished. No distress.  Caucasian female who appears to be in no acute distress.  HENT:  Head: Atraumatic.  There is a 3 cm horizontal laceration to the occipital region, not actively bleeding, mild tenderness to palpation, no crepitus. No foreign object noted.  Eyes: Conjunctivae and EOM are normal. Pupils are equal, round, and reactive to light.  Neck: Neck supple.  Permanent trach.  Cardiovascular:  Irregularly irregular heart rate.  Pulmonary/Chest: Effort normal and breath sounds normal.  Abdominal: Soft.  Musculoskeletal:  No significant midline spine tenderness, crepitus, or step-off. No tenderness to all 4 extremities.  Neurological: She is alert and oriented to person, place, and time. GCS eye subscore is 4. GCS verbal subscore is 5. GCS motor subscore is 6.  Skin: No rash noted.  Psychiatric: She has a normal mood and affect.  Nursing note and vitals reviewed.   ED Course  Procedures (including critical care time)  Elderly female here for a mechanical fall injuring the back of her head. She has a 3 cm laceration to the occipital region that will require suture repair. She is not on any anticoagulation. Will obtain head and neck CT to r/u occult fracture or intracranial bleed.  4:07 PM Head and cervical spine CT shows no  evidence of acute intracranial abnormalities. Patient was reassured. Laceration stapled by me. Care discussed with Dr. Lenon Ahmadi REPAIR Performed by: Domenic Moras Authorized by: Domenic Moras Consent: Verbal consent obtained. Risks and benefits: risks, benefits and alternatives were discussed Consent given by: patient Patient identity confirmed: provided demographic data Prepped and Draped in normal sterile fashion Wound explored  Laceration Location: occipital scalp  Laceration Length: 3cm  No Foreign Bodies seen or palpated  Anesthesia: local infiltration  Local anesthetic: lidocaine 2% w epinephrine  Anesthetic total: 3 ml  Irrigation method: syringe Amount of cleaning: standard  Skin closure: surgical staples  Number of staples: 5  Technique: surgical staples  Patient tolerance: Patient tolerated the procedure well with no immediate complications.   Labs Review Labs Reviewed - No data to display  Imaging Review Ct Head Wo Contrast  12/14/2014   CLINICAL DATA:  Lost balance and fell, hitting head on a wooden step. Laceration to the back of the head. Initial encounter.  EXAM: CT HEAD WITHOUT CONTRAST  CT CERVICAL SPINE WITHOUT CONTRAST  TECHNIQUE: Multidetector CT imaging of the head and cervical spine was performed following the standard protocol without intravenous contrast. Multiplanar CT image reconstructions of the cervical spine were also generated.  COMPARISON:  10/02/2012  FINDINGS: CT HEAD FINDINGS  Mildly prominent CSF space posterior to the cerebellum on the posterior fossa is unchanged may represent a mega cisterna magna or arachnoid cyst. Extensive chronic encephalomalacia is again seen involving the left parietal, temporal, and occipital lobes with ex vacuo dilatation of the left lateral ventricle, unchanged. Chronic encephalomalacia in the anteromedial right frontal lobe is also unchanged.  There is no evidence of acute cortical infarct, intracranial  hemorrhage, mass, midline shift, or extra-axial fluid collection. Periventricular white-matter hypodensities are similar to the prior study and nonspecific but compatible with chronic small vessel ischemic disease. Prior left pterional craniectomy is again  noted.  Prior bilateral cataract extraction is noted. Mastoid air cells neck and visualized paranasal sinuses are clear. Moderate carotid siphon calcification is noted. No skull fracture is identified.  CT CERVICAL SPINE FINDINGS  Vertebral alignment is unchanged and near anatomic. No acute cervical spine fracture is identified. Moderate multilevel cervical disc degeneration is again identified, worst at C5-6 and C6-7 with associated degenerative endplate changes. Moderate multilevel facet arthrosis is also again seen. Centrilobular emphysema is noted in the lung apices. Postsurgical changes are again noted in the neck including prior tracheostomy in left-sided neck dissection. Moderate vascular calcification.  IMPRESSION: 1. No evidence of acute intracranial abnormality. 2. Extensive chronic encephalomalacia in the left cerebral hemisphere. 3. No acute osseous abnormality in the cervical spine. 4. Moderate multilevel cervical disc and facet degeneration.   Electronically Signed   By: Logan Bores   On: 12/14/2014 15:43   Ct Cervical Spine Wo Contrast  12/14/2014   CLINICAL DATA:  Lost balance and fell, hitting head on a wooden step. Laceration to the back of the head. Initial encounter.  EXAM: CT HEAD WITHOUT CONTRAST  CT CERVICAL SPINE WITHOUT CONTRAST  TECHNIQUE: Multidetector CT imaging of the head and cervical spine was performed following the standard protocol without intravenous contrast. Multiplanar CT image reconstructions of the cervical spine were also generated.  COMPARISON:  10/02/2012  FINDINGS: CT HEAD FINDINGS  Mildly prominent CSF space posterior to the cerebellum on the posterior fossa is unchanged may represent a mega cisterna magna or  arachnoid cyst. Extensive chronic encephalomalacia is again seen involving the left parietal, temporal, and occipital lobes with ex vacuo dilatation of the left lateral ventricle, unchanged. Chronic encephalomalacia in the anteromedial right frontal lobe is also unchanged.  There is no evidence of acute cortical infarct, intracranial hemorrhage, mass, midline shift, or extra-axial fluid collection. Periventricular white-matter hypodensities are similar to the prior study and nonspecific but compatible with chronic small vessel ischemic disease. Prior left pterional craniectomy is again noted.  Prior bilateral cataract extraction is noted. Mastoid air cells neck and visualized paranasal sinuses are clear. Moderate carotid siphon calcification is noted. No skull fracture is identified.  CT CERVICAL SPINE FINDINGS  Vertebral alignment is unchanged and near anatomic. No acute cervical spine fracture is identified. Moderate multilevel cervical disc degeneration is again identified, worst at C5-6 and C6-7 with associated degenerative endplate changes. Moderate multilevel facet arthrosis is also again seen. Centrilobular emphysema is noted in the lung apices. Postsurgical changes are again noted in the neck including prior tracheostomy in left-sided neck dissection. Moderate vascular calcification.  IMPRESSION: 1. No evidence of acute intracranial abnormality. 2. Extensive chronic encephalomalacia in the left cerebral hemisphere. 3. No acute osseous abnormality in the cervical spine. 4. Moderate multilevel cervical disc and facet degeneration.   Electronically Signed   By: Logan Bores   On: 12/14/2014 15:43     EKG Interpretation None      MDM   Final diagnoses:  Fall  Scalp laceration, initial encounter    BP 107/72 mmHg  Pulse 87  Temp(Src) 97.7 F (36.5 C) (Oral)  Resp 18  SpO2 95%  I have reviewed nursing notes and vital signs. I personally reviewed the imaging tests through PACS system  I  reviewed available ER/hospitalization records thought the EMR     Domenic Moras, PA-C 12/14/14 [place], MD 12/15/14 1209

## 2014-12-14 NOTE — ED Notes (Signed)
Awake. Verbally responsive. A/O x4. Resp even and unlabored. No audible adventitious breath sounds noted. ABC's intact. Head lac with controlled bleeding.

## 2014-12-17 ENCOUNTER — Telehealth: Payer: Self-pay | Admitting: Internal Medicine

## 2014-12-17 MED ORDER — PREDNISONE 10 MG PO TABS: ORAL_TABLET | ORAL | Status: DC

## 2014-12-17 NOTE — Telephone Encounter (Signed)
Patient's husband calling to report patient started having diarrhea last week. Last night, she was up 6 times.. Reports the stools are urgent and she has some blood in the stools. She has been off Prednisone x 3 weeks now. Please, advise.

## 2014-12-17 NOTE — Telephone Encounter (Signed)
Spoke with patient's husband and he states it does not smell like c.diff. He will call back by Friday with update if she is not better with Prednisone. Rx sent to pharmacy.

## 2014-12-17 NOTE — Telephone Encounter (Signed)
He is very savvy, does this seem like C. difficile to him? Restart prednisone 20 mg 2 weeks and decrease by 5 mg every 7 days until off Keep me up-to-date on how she is doing

## 2014-12-22 ENCOUNTER — Encounter: Payer: Self-pay | Admitting: Internal Medicine

## 2014-12-22 ENCOUNTER — Ambulatory Visit (INDEPENDENT_AMBULATORY_CARE_PROVIDER_SITE_OTHER): Payer: PPO | Admitting: Internal Medicine

## 2014-12-22 VITALS — BP 104/68 | HR 88 | Temp 98.2°F | Resp 16 | Ht 65.0 in | Wt 117.0 lb

## 2014-12-22 DIAGNOSIS — Z5189 Encounter for other specified aftercare: Secondary | ICD-10-CM | POA: Diagnosis not present

## 2014-12-22 NOTE — Patient Instructions (Signed)

## 2014-12-22 NOTE — Progress Notes (Signed)
Pre visit review using our clinic review tool, if applicable. No additional management support is needed unless otherwise documented below in the visit note. 

## 2014-12-22 NOTE — Progress Notes (Signed)
Subjective:  Patient ID: [name], female    DOB: 09/15/1940  Age: 74 y.o. MRN: [id number]  CC: Wound Check   HPI [name] presents for staple removal - she fell and hit the back of her head 8 days ago and has 5 staples that need to be removed, she denies any headache, dizziness, nausea, vomiting, or trouble walking.  Outpatient Prescriptions Prior to Visit  Medication Sig Dispense Refill  . aspirin 81 MG tablet Take 81 mg by mouth daily.    . Calcium Carbonate-Vitamin D (CALCIUM 500 + D PO) Take 1 tablet by mouth daily.    . cholestyramine (QUESTRAN) 4 G packet Take 1 packet (4 g total) by mouth every evening. 30 each 2  . digoxin (LANOXIN) 0.125 MG tablet Take 1 tablet (125 mcg total) by mouth daily. 90 tablet 1  . furosemide (LASIX) 20 MG tablet Take 1 tablet (20 mg total) by mouth daily. 30 tablet 5  . levETIRAcetam (KEPPRA) 750 MG tablet Take 2 tablets (1,500 mg total) by mouth 2 (two) times daily. 360 tablet 3  . levothyroxine (SYNTHROID, LEVOTHROID) 50 MCG tablet Take 1 tablet (50 mcg total) by mouth every evening. 90 tablet 3  . lidocaine (XYLOCAINE) 2 % solution Take 20 mLs by mouth every 3 (three) hours as needed for mouth pain. Oral pain    . Mesalamine (ASACOL HD) 800 MG TBEC Take 2 tablets (1,600 mg total) by mouth 3 (three) times daily. NEEDS OFFICE VISIT FOR FURTHER REFILLS 540 tablet 0  . Multiple Vitamin (MULTIVITAMIN WITH MINERALS) TABS Take 1 tablet by mouth daily.    Marland Kitchen PHENobarbital (LUMINAL) 97.2 MG tablet TAKE 1 TABLET BY MOUTH ONCE EVERY MORNING 90 tablet 1  . pravastatin (PRAVACHOL) 40 MG tablet Take 1 tablet (40 mg total) by mouth every evening. 90 tablet 1  . predniSONE (DELTASONE) 10 MG tablet Take 20 mg po daily x 2 weeks then decrease by 5 mg every 7 days until off. 100 tablet 1  . HYDROcodone-acetaminophen (NORCO/VICODIN) 5-325 MG per tablet Take 1 tablet by mouth every 6 (six) hours as needed for moderate pain. 6 tablet 0  . predniSONE  (DELTASONE) 10 MG tablet Take 4 tablets by mouth for 5 days Take 3 tablets by mouth for 5 days Take 2 tablets by mouth for 5 days Take 1 tablet by mouth for 5 days (Patient not taking: Reported on 12/14/2014) 50 tablet 0   No facility-administered medications prior to visit.    ROS Review of Systems  Constitutional: Negative.  Negative for activity change, appetite change, fatigue and unexpected weight change.  HENT: Negative.  Negative for trouble swallowing.   Eyes: Negative.   Respiratory: Negative.   Cardiovascular: Negative.   Gastrointestinal: Negative.  Negative for abdominal pain.  Endocrine: Negative.   Genitourinary: Negative.   Musculoskeletal: Negative.   Skin: Negative.   Allergic/Immunologic: Negative.   Neurological: Negative.  Negative for dizziness, weakness, light-headedness, numbness and headaches.  Hematological: Negative.  Negative for adenopathy. Does not bruise/bleed easily.  Psychiatric/Behavioral: Negative.     Objective:  BP 104/68 mmHg  Pulse 88  Temp(Src) 98.2 F (36.8 C) (Oral)  Ht 5\' 5"  (1.651 m)  Wt 117 lb (53.071 kg)  BMI 19.47 kg/m2  SpO2 98%  BP Readings from Last 3 Encounters:  12/22/14 104/68  12/14/14 138/54  12/03/14 111/71    Wt Readings from Last 3 Encounters:  12/22/14 117 lb (53.071 kg)  12/03/14 120 lb (54.432 kg)  12/03/14 121 lb 9.6 oz (55.157 kg)    Physical Exam  HENT:  Head:      Lab Results  Component Value Date   WBC 13.6* 09/17/2014   HGB 14.3 09/17/2014   HCT 42.2 09/17/2014   PLT 211.0 09/17/2014   GLUCOSE 87 09/17/2014   CHOL 165 02/10/2014   TRIG 57.0 02/10/2014   HDL 76.20 02/10/2014   LDLCALC 77 02/10/2014   ALT 23 09/17/2014   AST 15 09/17/2014   NA 133* 09/17/2014   K 3.7 09/17/2014   CL 94* 09/17/2014   CREATININE 0.61 09/17/2014   BUN 15 09/17/2014   CO2 34* 09/17/2014   TSH 0.98 09/17/2014   INR 1.26 03/18/2012   HGBA1C 5.7 02/10/2014    Ct Head Wo Contrast  12/14/2014    CLINICAL DATA:  Lost balance and fell, hitting head on a wooden step. Laceration to the back of the head. Initial encounter.  EXAM: CT HEAD WITHOUT CONTRAST  CT CERVICAL SPINE WITHOUT CONTRAST  TECHNIQUE: Multidetector CT imaging of the head and cervical spine was performed following the standard protocol without intravenous contrast. Multiplanar CT image reconstructions of the cervical spine were also generated.  COMPARISON:  10/02/2012  FINDINGS: CT HEAD FINDINGS  Mildly prominent CSF space posterior to the cerebellum on the posterior fossa is unchanged may represent a mega cisterna magna or arachnoid cyst. Extensive chronic encephalomalacia is again seen involving the left parietal, temporal, and occipital lobes with ex vacuo dilatation of the left lateral ventricle, unchanged. Chronic encephalomalacia in the anteromedial right frontal lobe is also unchanged.  There is no evidence of acute cortical infarct, intracranial hemorrhage, mass, midline shift, or extra-axial fluid collection. Periventricular white-matter hypodensities are similar to the prior study and nonspecific but compatible with chronic small vessel ischemic disease. Prior left pterional craniectomy is again noted.  Prior bilateral cataract extraction is noted. Mastoid air cells neck and visualized paranasal sinuses are clear. Moderate carotid siphon calcification is noted. No skull fracture is identified.  CT CERVICAL SPINE FINDINGS  Vertebral alignment is unchanged and near anatomic. No acute cervical spine fracture is identified. Moderate multilevel cervical disc degeneration is again identified, worst at C5-6 and C6-7 with associated degenerative endplate changes. Moderate multilevel facet arthrosis is also again seen. Centrilobular emphysema is noted in the lung apices. Postsurgical changes are again noted in the neck including prior tracheostomy in left-sided neck dissection. Moderate vascular calcification.  IMPRESSION: 1. No evidence of  acute intracranial abnormality. 2. Extensive chronic encephalomalacia in the left cerebral hemisphere. 3. No acute osseous abnormality in the cervical spine. 4. Moderate multilevel cervical disc and facet degeneration.   Electronically Signed   By: Logan Bores   On: 12/14/2014 15:43   Ct Cervical Spine Wo Contrast  12/14/2014   CLINICAL DATA:  Lost balance and fell, hitting head on a wooden step. Laceration to the back of the head. Initial encounter.  EXAM: CT HEAD WITHOUT CONTRAST  CT CERVICAL SPINE WITHOUT CONTRAST  TECHNIQUE: Multidetector CT imaging of the head and cervical spine was performed following the standard protocol without intravenous contrast. Multiplanar CT image reconstructions of the cervical spine were also generated.  COMPARISON:  10/02/2012  FINDINGS: CT HEAD FINDINGS  Mildly prominent CSF space posterior to the cerebellum on the posterior fossa is unchanged may represent a mega cisterna magna or arachnoid cyst. Extensive chronic encephalomalacia is again seen involving the left parietal, temporal, and occipital lobes with ex vacuo dilatation of the left lateral ventricle, unchanged.  Chronic encephalomalacia in the anteromedial right frontal lobe is also unchanged.  There is no evidence of acute cortical infarct, intracranial hemorrhage, mass, midline shift, or extra-axial fluid collection. Periventricular white-matter hypodensities are similar to the prior study and nonspecific but compatible with chronic small vessel ischemic disease. Prior left pterional craniectomy is again noted.  Prior bilateral cataract extraction is noted. Mastoid air cells neck and visualized paranasal sinuses are clear. Moderate carotid siphon calcification is noted. No skull fracture is identified.  CT CERVICAL SPINE FINDINGS  Vertebral alignment is unchanged and near anatomic. No acute cervical spine fracture is identified. Moderate multilevel cervical disc degeneration is again identified, worst at C5-6 and C6-7  with associated degenerative endplate changes. Moderate multilevel facet arthrosis is also again seen. Centrilobular emphysema is noted in the lung apices. Postsurgical changes are again noted in the neck including prior tracheostomy in left-sided neck dissection. Moderate vascular calcification.  IMPRESSION: 1. No evidence of acute intracranial abnormality. 2. Extensive chronic encephalomalacia in the left cerebral hemisphere. 3. No acute osseous abnormality in the cervical spine. 4. Moderate multilevel cervical disc and facet degeneration.   Electronically Signed   By: Logan Bores   On: 12/14/2014 15:43    Assessment & Plan:   There are no diagnoses linked to this encounter. I have discontinued [name]'s HYDROcodone-acetaminophen. I am also having her maintain her multivitamin with minerals, lidocaine, Calcium Carbonate-Vitamin D (CALCIUM 500 + D PO), aspirin, Mesalamine, levothyroxine, pravastatin, PHENobarbital, cholestyramine, levETIRAcetam, digoxin, furosemide, and predniSONE.  No orders of the defined types were placed in this encounter.     Follow-up: Return in about 2 months (around 02/21/2015).  Scarlette Calico, MD

## 2014-12-29 ENCOUNTER — Ambulatory Visit: Payer: PPO | Admitting: Internal Medicine

## 2015-01-03 ENCOUNTER — Other Ambulatory Visit: Payer: Self-pay | Admitting: Internal Medicine

## 2015-01-07 ENCOUNTER — Emergency Department (HOSPITAL_COMMUNITY)
Admission: EM | Admit: 2015-01-07 | Discharge: 2015-01-07 | Disposition: A | Discharge status: Home / Self Care | Payer: PPO | Attending: Emergency Medicine | Admitting: Emergency Medicine

## 2015-01-07 ENCOUNTER — Encounter (HOSPITAL_COMMUNITY): Payer: Self-pay | Admitting: Emergency Medicine

## 2015-01-07 ENCOUNTER — Telehealth: Payer: Self-pay | Admitting: Internal Medicine

## 2015-01-07 DIAGNOSIS — Z8673 Personal history of transient ischemic attack (TIA), and cerebral infarction without residual deficits: Secondary | ICD-10-CM | POA: Diagnosis not present

## 2015-01-07 DIAGNOSIS — G40909 Epilepsy, unspecified, not intractable, without status epilepticus: Secondary | ICD-10-CM | POA: Insufficient documentation

## 2015-01-07 DIAGNOSIS — D692 Other nonthrombocytopenic purpura: Secondary | ICD-10-CM | POA: Insufficient documentation

## 2015-01-07 DIAGNOSIS — Q434 Duplication of intestine: Secondary | ICD-10-CM | POA: Diagnosis not present

## 2015-01-07 DIAGNOSIS — Z872 Personal history of diseases of the skin and subcutaneous tissue: Secondary | ICD-10-CM | POA: Insufficient documentation

## 2015-01-07 DIAGNOSIS — Z87891 Personal history of nicotine dependence: Secondary | ICD-10-CM | POA: Diagnosis not present

## 2015-01-07 DIAGNOSIS — E039 Hypothyroidism, unspecified: Secondary | ICD-10-CM | POA: Insufficient documentation

## 2015-01-07 DIAGNOSIS — Z853 Personal history of malignant neoplasm of breast: Secondary | ICD-10-CM | POA: Insufficient documentation

## 2015-01-07 DIAGNOSIS — Z85828 Personal history of other malignant neoplasm of skin: Secondary | ICD-10-CM | POA: Diagnosis not present

## 2015-01-07 DIAGNOSIS — Z8619 Personal history of other infectious and parasitic diseases: Secondary | ICD-10-CM | POA: Insufficient documentation

## 2015-01-07 DIAGNOSIS — E785 Hyperlipidemia, unspecified: Secondary | ICD-10-CM | POA: Diagnosis not present

## 2015-01-07 DIAGNOSIS — Z8659 Personal history of other mental and behavioral disorders: Secondary | ICD-10-CM | POA: Diagnosis not present

## 2015-01-07 DIAGNOSIS — R58 Hemorrhage, not elsewhere classified: Secondary | ICD-10-CM | POA: Diagnosis present

## 2015-01-07 DIAGNOSIS — I482 Chronic atrial fibrillation: Secondary | ICD-10-CM | POA: Diagnosis not present

## 2015-01-07 DIAGNOSIS — Z8521 Personal history of malignant neoplasm of larynx: Secondary | ICD-10-CM | POA: Insufficient documentation

## 2015-01-07 DIAGNOSIS — Z7982 Long term (current) use of aspirin: Secondary | ICD-10-CM | POA: Diagnosis not present

## 2015-01-07 DIAGNOSIS — Z79899 Other long term (current) drug therapy: Secondary | ICD-10-CM | POA: Insufficient documentation

## 2015-01-07 DIAGNOSIS — Z86010 Personal history of colonic polyps: Secondary | ICD-10-CM | POA: Insufficient documentation

## 2015-01-07 DIAGNOSIS — M1990 Unspecified osteoarthritis, unspecified site: Secondary | ICD-10-CM | POA: Insufficient documentation

## 2015-01-07 DIAGNOSIS — Z87440 Personal history of urinary (tract) infections: Secondary | ICD-10-CM | POA: Insufficient documentation

## 2015-01-07 DIAGNOSIS — I10 Essential (primary) hypertension: Secondary | ICD-10-CM | POA: Diagnosis not present

## 2015-01-07 DIAGNOSIS — Z8701 Personal history of pneumonia (recurrent): Secondary | ICD-10-CM | POA: Insufficient documentation

## 2015-01-07 DIAGNOSIS — F0390 Unspecified dementia without behavioral disturbance: Secondary | ICD-10-CM | POA: Insufficient documentation

## 2015-01-07 LAB — CBC WITH DIFFERENTIAL/PLATELET
Basophils Absolute: 0 10*3/uL (ref 0.0–0.1)
Basophils Relative: 0 % (ref 0–1)
EOS ABS: 0.1 10*3/uL (ref 0.0–0.7)
EOS PCT: 2 % (ref 0–5)
HCT: 36.6 % (ref 36.0–46.0)
Hemoglobin: 12.1 g/dL (ref 12.0–15.0)
LYMPHS ABS: 1.7 10*3/uL (ref 0.7–4.0)
Lymphocytes Relative: 21 % (ref 12–46)
MCH: 32.3 pg (ref 26.0–34.0)
MCHC: 33.1 g/dL (ref 30.0–36.0)
MCV: 97.6 fL (ref 78.0–100.0)
MONO ABS: 0.6 10*3/uL (ref 0.1–1.0)
MONOS PCT: 7 % (ref 3–12)
Neutro Abs: 5.6 10*3/uL (ref 1.7–7.7)
Neutrophils Relative %: 70 % (ref 43–77)
Platelets: 210 10*3/uL (ref 150–400)
RBC: 3.75 MIL/uL — ABNORMAL LOW (ref 3.87–5.11)
RDW: 16.7 % — ABNORMAL HIGH (ref 11.5–15.5)
WBC: 8 10*3/uL (ref 4.0–10.5)

## 2015-01-07 LAB — COMPREHENSIVE METABOLIC PANEL
ALT: 15 U/L (ref 14–54)
ANION GAP: 7 (ref 5–15)
AST: 19 U/L (ref 15–41)
Albumin: 3.8 g/dL (ref 3.5–5.0)
Alkaline Phosphatase: 109 U/L (ref 38–126)
BUN: 7 mg/dL (ref 6–20)
CALCIUM: 9.4 mg/dL (ref 8.9–10.3)
CO2: 32 mmol/L (ref 22–32)
CREATININE: 0.7 mg/dL (ref 0.44–1.00)
Chloride: 93 mmol/L — ABNORMAL LOW (ref 101–111)
GFR calc non Af Amer: >60 mL/min (ref >60)
GLUCOSE: 103 mg/dL — AB (ref 65–99)
Potassium: 3.5 mmol/L (ref 3.5–5.1)
Sodium: 132 mmol/L — ABNORMAL LOW (ref 135–145)
TOTAL PROTEIN: 7.8 g/dL (ref 6.5–8.1)
Total Bilirubin: 0.5 mg/dL (ref 0.3–1.2)

## 2015-01-07 LAB — PROTIME-INR
INR: 1.24 (ref 0.00–1.49)
Prothrombin Time: 15.7 seconds — ABNORMAL HIGH (ref 11.6–15.2)

## 2015-01-07 NOTE — Telephone Encounter (Signed)
PLEASE NOTE: All timestamps contained within this report are represented as [place] Standard Time. CONFIDENTIALTY NOTICE: This fax transmission is intended only for the addressee. It contains information that is legally privileged, confidential or otherwise protected from use or disclosure. If you are not the intended recipient, you are strictly prohibited from reviewing, disclosing, copying using or disseminating any of this information or taking any action in reliance on or regarding this information. If you have received this fax in error, please notify us immediately by telephone so that we can arrange for its return to [place]. Phone: [phone], Toll-Free: [phone], Fax: [phone] Page: 1 of 1 Call Id: [id number] [facility] - Client [place] Patient Name: [name] DOB: Nov 13, 1940 Initial Comment Caller states wife has spots on lower leg from knee down Nurse Assessment Nurse: Donalynn Furlong, RN, Myna Hidalgo Date/Time Eilene Ghazi Time): 01/07/2015 2:40:37 PM Confirm and document reason for call. If symptomatic, describe symptoms. ---Caller states wife has spots on lower leg from knee down . Approx 100 on ea leg. Pt is on Prednisone for ulcerative coloitis currently. Hx of [name] (not current) on Baby ASA now q day. Legs are not warm to touch, the spots have been increasing in amount over the last week . Amount has stayed the same over the last 4 days. Afebrile. Pt "sits a lot at the computer, without moving" Has the patient traveled out of the country within the last 30 days? ---No Does the patient require triage? ---Yes Related visit to physician within the last 2 weeks? ---Yes Does the PT have any chronic conditions? (i.e. diabetes, asthma, etc.) ---Yes List chronic conditions. ---A FIB, Colitis, HBP , High cholesterol, hypothyroidism Guidelines Guideline Title Affirmed Question Affirmed Notes Rash or Redness -  Widespread [1] Purple or blood-colored spots or dots AND [2] no fever Final Disposition User Go to ED Now (or PCP triage) Donalynn Furlong, RN, Myna Hidalgo

## 2015-01-07 NOTE — ED Notes (Signed)
Pt c/o red spots on lower legs onset a few weeks ago, exact date unknown. Pt denies use of anticoagulants except for baby aspirin, denies other complaints at this time.

## 2015-01-07 NOTE — ED Provider Notes (Signed)
CSN: [id number]     Arrival date & time 01/07/15  1523 History   First MD Initiated Contact with Patient 01/07/15 1609     Chief Complaint  Patient presents with  . Bleeding/Bruising     (Consider location/radiation/quality/duration/timing/severity/associated sxs/prior Treatment) HPI  74 year old female presents with red spots to her lower legs. History taken from both patient and husband. Rash seemed to start sometime within the last couple weeks but has not been worsening over the last few days. There has been some ankle and feet swelling associated with this. The rash consists of dark red spots that do not blanch. Patient set up an appointment with their physician early next week but when talking to the on-call nurse they were concerned about the rash and sent the patient to the ER for immediate evaluation. Had a mild headache this morning but otherwise has not been having a headache. She feels the headache was associated with knowing she needs to go into the ER. Denies headache now. No vomiting, bleeding, or weakness. No fevers or infectious symptoms. Patient states the rash does not itch. No new medicines, patient is on a baby aspirin but no other blood thinners. Was on Xarelto a few months ago for atrial fibrillation but started bleeding and thus was taken off of it. Besides noticing the rash there are no other symptoms.  Past Medical History  Diagnosis Date  . Peripheral vascular disease   . Subdural hematoma 1990s    s/p fall in bathtub  . Mild dysplasia of cervix   . UTI (lower urinary tract infection)   . Urinary incontinence   . Cerebrovascular disease 2000  . Personal history of colonic polyps     adenomatous 1997 & tubular adenoma and hyplastic  2008  . Hyperlipidemia     takes Pravastatin daily  . H/O alcohol abuse   . Diverticulosis of colon (without mention of hemorrhage)   . Redundant colon   . C. difficile colitis   . Ulcerative colitis     takes [place] daily  .  Permanent atrial fibrillation   . Seizures     takes Phenobarbital and Keppra daily;last seizure was April 1,2014  . Stroke 2000    off balance on right side a little and peripheral vision loss on right side  . Arthritis   . Joint pain   . Joint swelling   . History of UTI 10-2012  . Hypothyroidism     takes Levothyroxine daily  . Squamous cell carcinoma of mouth   . Adenocarcinoma, breast     bilateral  . Anxiety   . Dementia   . Cancer of larynx   . Hypertension   . Gait disorder   . Oropharyngeal cancer   . Pneumonia 2/16  . Cellulitis    Past Surgical History  Procedure Laterality Date  . Subdural hematoma evacuation via craniotomy    . Oral surgery for squamous cell carcinoma of the mouth      x 3  . Multiple tooth extractions      due to oral cancer  . Cataract extraction, bilateral    . Breast lumpectomy      left breast with radiation therapy  . Carotid endarterectomy      left  . Mastectomy      Right, history with nodule dissection  . Orif hip fracture  Sept '12    Right hip: screw and plate repair.  . Larynx surgery  08/2010    [facility] x 2  .  Tubal ligation    . Colonoscopy    . Orif humerus fracture Right 10/30/2012    Procedure: OPEN REDUCTION INTERNAL FIXATION (ORIF) HUMERAL SHAFT FRACTURE;  Surgeon: Hessie Dibble, MD;  Location: [facility];  Service: Orthopedics;  Laterality: Right;  BIG C-ARM, SHOULDER POSITION  . Hammer toe surgery Right 03/2013    pins and screws  . Flexible sigmoidoscopy N/A 06/21/2013    Procedure: FLEXIBLE SIGMOIDOSCOPY;  Surgeon: Jerene Bears, MD;  Location: [facility];  Service: Gastroenterology;  Laterality: N/A;  . Hardware removal Right 11/14/2013    Procedure: RIGHT SHOUDER HARDWARE REMOVAL;  Surgeon: Nita Sells, MD;  Location: [facility];  Service: Orthopedics;  Laterality: Right;  . Incision and drainage Right 11/14/2013    Procedure: INCISION AND DRAINAGE;  Surgeon: Nita Sells, MD;  Location: [facility];   Service: Orthopedics;  Laterality: Right;  . Shoulder surgery  10/2013   Family History  Problem Relation Age of Onset  . Coronary artery disease Mother   . Heart disease Mother   . Diabetes Sister   . Breast cancer Maternal Aunt   . Breast cancer Sister   . Colon cancer      nephew (sister's son)   History  Substance Use Topics  . Smoking status: Former Smoker -- 72 years    Quit date: 10/26/2004  . Smokeless tobacco: Never Used     Comment: quit in 2007  . Alcohol Use: 0.0 oz/week    0 Standard drinks or equivalent per week     Comment: normally 1 glass wine after dinner - occasional   OB History    No data available     Review of Systems  Constitutional: Negative for fever.  Respiratory: Negative for shortness of breath.   Cardiovascular: Positive for leg swelling. Negative for chest pain.  Gastrointestinal: Negative for abdominal pain.  Skin: Positive for rash.  Neurological: Negative for weakness and headaches.  All other systems reviewed and are negative.     Allergies  Xarelto and Diltiazem  Home Medications   Prior to Admission medications   Medication Sig Start Date End Date Taking? Authorizing Provider  aspirin 81 MG tablet Take 81 mg by mouth daily.    Historical Provider, MD  Calcium Carbonate-Vitamin D (CALCIUM 500 + D PO) Take 1 tablet by mouth daily.    Historical Provider, MD  cholestyramine (QUESTRAN) 4 G packet TAKE 1 PACKET (4 G TOTAL) BY MOUTH EVERY EVENING. 01/06/15   Jerene Bears, MD  digoxin (LANOXIN) 0.125 MG tablet Take 1 tablet (125 mcg total) by mouth daily. 10/29/14   Janith Lima, MD  furosemide (LASIX) 20 MG tablet Take 1 tablet (20 mg total) by mouth daily. 10/29/14   Janith Lima, MD  levETIRAcetam (KEPPRA) 750 MG tablet Take 2 tablets (1,500 mg total) by mouth 2 (two) times daily. 10/22/14   Kathrynn Ducking, MD  levothyroxine (SYNTHROID, LEVOTHROID) 50 MCG tablet Take 1 tablet (50 mcg total) by mouth every evening. 06/17/14   Janith Lima, MD  lidocaine (XYLOCAINE) 2 % solution Take 20 mLs by mouth every 3 (three) hours as needed for mouth pain. Oral pain    Historical Provider, MD  Mesalamine (ASACOL HD) 800 MG TBEC Take 2 tablets (1,600 mg total) by mouth 3 (three) times daily. NEEDS OFFICE VISIT FOR FURTHER REFILLS 06/13/14   Jerene Bears, MD  Multiple Vitamin (MULTIVITAMIN WITH MINERALS) TABS Take 1 tablet by mouth daily.  Historical Provider, MD  PHENobarbital (LUMINAL) 97.2 MG tablet TAKE 1 TABLET BY MOUTH ONCE EVERY MORNING 10/01/14   Kathrynn Ducking, MD  pravastatin (PRAVACHOL) 40 MG tablet Take 1 tablet (40 mg total) by mouth every evening. 06/17/14   Janith Lima, MD  predniSONE (DELTASONE) 10 MG tablet Take 20 mg po daily x 2 weeks then decrease by 5 mg every 7 days until off. 12/17/14   Jerene Bears, MD   BP 109/58 mmHg  Pulse 91  Temp(Src) 97.7 F (36.5 C) (Oral)  Resp 18  SpO2 95% Physical Exam  Constitutional: She is oriented to person, place, and time. She appears well-developed and well-nourished.  HENT:  Head: Normocephalic and atraumatic.  Right Ear: External ear normal.  Left Ear: External ear normal.  Nose: Nose normal.  Eyes: EOM are normal. Pupils are equal, round, and reactive to light. Right eye exhibits no discharge. Left eye exhibits no discharge.  Neck:  Neck stoma without swelling or redness  Cardiovascular: Normal rate, regular rhythm and normal heart sounds.   Pulmonary/Chest: Effort normal and breath sounds normal.  Abdominal: Soft. She exhibits no distension. There is no tenderness.  Musculoskeletal: She exhibits edema (pitting edema to feet and ankles bilaterally).  Neurological: She is alert and oriented to person, place, and time.  CN 2-12 grossly intact. 5/5 strength in all 4 extremities.   Skin: Skin is warm and dry. Purpura and rash noted.  See picture below for rash. Nonblanchable, non-tender purpura to lower extremities. No other rash noted  Nursing note and vitals  reviewed.     ED Course  Procedures (including critical care time) Labs Review Labs Reviewed  CBC WITH DIFFERENTIAL/PLATELET - Abnormal; Notable for the following:    RBC 3.75 (*)    RDW 16.7 (*)    All other components within normal limits  COMPREHENSIVE METABOLIC PANEL - Abnormal; Notable for the following:    Sodium 132 (*)    Chloride 93 (*)    Glucose, Bld 103 (*)    All other components within normal limits  PROTIME-INR - Abnormal; Notable for the following:    Prothrombin Time 15.7 (*)    All other components within normal limits    Imaging Review No results found.   EKG Interpretation None      MDM   Final diagnoses:  Purpura    Patient is well-appearing and has no systemic signs of illness. Her vital signs are unremarkable. There is no headache or bleeding. Her rash does not blanch, possibly purpura. Labs including platelets unremarkable from baseline. No anemia. Discussed with hematology, Dr. Alvy Bimler, who recommends f/u with her oncologist, Dr. Benay Spice as outpatient. Recommend keep her close PCP f/u. If she develops concerning signs (AMS, headache, bleeding, etc), instructed to return to ER     Sherwood Gambler, MD 01/07/15 1758

## 2015-01-09 ENCOUNTER — Telehealth: Payer: Self-pay | Admitting: *Deleted

## 2015-01-09 ENCOUNTER — Telehealth: Payer: Self-pay | Admitting: Oncology

## 2015-01-09 NOTE — Telephone Encounter (Signed)
Received voice message from pt husband stating that pt was in ED 01/07/15 for c/o bleeding/bruising to lower legs and they were instructed to f/u with oncologist; requesting appt with Dr. Benay Spice asap.  Last seen in office 2013.  Note to Dr. Benay Spice.

## 2015-01-09 NOTE — Telephone Encounter (Signed)
Left message to confirm appointment for August. Mailed calendar °

## 2015-01-09 NOTE — Telephone Encounter (Signed)
Per Dr. Benay Spice; notified pt husband MD states pt needs to f/u with PCP first and PCP can referral pt to oncologist if needed.  Pt's husband states that ED instructed them to f/u with Dr. Benay Spice because of the dx "Purpura".  Explained Dr. Benay Spice reviewed ED chart and platelets were normal and Dr. Benay Spice can see pt x10month.  Pt's husband states that pt will see Dr. Ronnald Ramp Monday and will ask him if they need a oncologist referral.  Informed pt that Dr. Benay Spice will be glad to schedule appt x66month.  Pt's husband verbalized understanding and expressed appreciation for call back.

## 2015-01-12 ENCOUNTER — Ambulatory Visit (INDEPENDENT_AMBULATORY_CARE_PROVIDER_SITE_OTHER): Payer: PPO | Admitting: Internal Medicine

## 2015-01-12 ENCOUNTER — Other Ambulatory Visit: Payer: Self-pay | Admitting: Internal Medicine

## 2015-01-12 ENCOUNTER — Telehealth: Payer: Self-pay | Admitting: Internal Medicine

## 2015-01-12 VITALS — BP 100/50 | HR 90 | Temp 98.5°F | Resp 16 | Ht 65.0 in | Wt 117.0 lb

## 2015-01-12 DIAGNOSIS — M310 Hypersensitivity angiitis: Secondary | ICD-10-CM | POA: Insufficient documentation

## 2015-01-12 DIAGNOSIS — D692 Other nonthrombocytopenic purpura: Secondary | ICD-10-CM

## 2015-01-12 DIAGNOSIS — R21 Rash and other nonspecific skin eruption: Secondary | ICD-10-CM | POA: Diagnosis not present

## 2015-01-12 NOTE — Telephone Encounter (Signed)
PLEASE NOTE: All timestamps contained within this report are represented as [place] Standard Time. CONFIDENTIALTY NOTICE: This fax transmission is intended only for the addressee. It contains information that is legally privileged, confidential or otherwise protected from use or disclosure. If you are not the intended recipient, you are strictly prohibited from reviewing, disclosing, copying using or disseminating any of this information or taking any action in reliance on or regarding this information. If you have received this fax in error, please notify us immediately by telephone so that we can arrange for its return to [place]. Phone: [phone], Toll-Free: [phone], Fax: [phone] Page: 1 of 1 Call Id: [id number] [facility] - Client [place] Patient Name: MAXX CALAWAY DOB: 16-Feb-1941 Initial Comment Caller states his wife's stitches are bleeding. Nurse Assessment Nurse: Markus Daft, RN, Sherre Poot Date/Time (Eastern Time): 01/12/2015 3:44:58 PM Confirm and document reason for call. If symptomatic, describe symptoms. ---Caller states his wife's stitches to chin are bleeding after MD did a biopsy to the area around 1:30 pm today. Blood was pooling on her foot. Bleeding has stopped, and she is laying down with legs elevated. Has the patient traveled out of the country within the last 30 days? ---Not Applicable Does the patient require triage? ---Yes Related visit to physician within the last 2 weeks? ---Yes Does the PT have any chronic conditions? (i.e. diabetes, asthma, etc.) ---Yes List chronic conditions. ---Afib - ASA 81 mg; HTN - controlled with medications Guidelines Guideline Title Affirmed Question Affirmed Notes Post-Op Incision Symptoms Dressing soaked with blood or body fluid (e.g., drainage) Final Disposition User Call PCP Now Markus Daft, RN, [place] Comments Caller states that there is a bandage over the site, and  this was not taken off so they are unsure about the stitch. - Spoke with Colletta Maryland, staff, Caller advised to come to office now / per Dr. Ronnald Ramp. Caller agreeable and they will be right in. Referrals REFERRED TO PCP OFFICE Disagree/Comply: Comply

## 2015-01-12 NOTE — Telephone Encounter (Signed)
PLEASE NOTE: All timestamps contained within this report are represented as [place] Standard Time. CONFIDENTIALTY NOTICE: This fax transmission is intended only for the addressee. It contains information that is legally privileged, confidential or otherwise protected from use or disclosure. If you are not the intended recipient, you are strictly prohibited from reviewing, disclosing, copying using or disseminating any of this information or taking any action in reliance on or regarding this information. If you have received this fax in error, please notify us immediately by telephone so that we can arrange for its return to [place]. Phone: [phone], Toll-Free: [phone], Fax: [phone] Page: 1 of 1 Call Id: [id number] [facility] - Client [place] Patient Name: [name] DOB: Jan 31, 1941 Initial Comment Caller states his wife's stitches are bleeding. Nurse Assessment Nurse: Markus Daft, RN, Sherre Poot Date/Time (Eastern Time): 01/12/2015 3:44:58 PM Confirm and document reason for call. If symptomatic, describe symptoms. ---Caller states his wife's stitches to chin are bleeding after MD did a biopsy to the area around 1:30 pm today. Blood was pooling on her foot. Bleeding has stopped, and she is laying down with legs elevated. Has the patient traveled out of the country within the last 30 days? ---Not Applicable Does the patient require triage? ---Yes Related visit to physician within the last 2 weeks? ---Yes Does the PT have any chronic conditions? (i.e. diabetes, asthma, etc.) ---Yes List chronic conditions. ---Afib - ASA 81 mg; HTN - controlled with medications Guidelines Guideline Title Affirmed Question Affirmed Notes Post-Op Incision Symptoms Dressing soaked with blood or body fluid (e.g., drainage) Final Disposition User Call PCP Now Markus Daft, RN, Windy Comments Caller states that there is a bandage over the site, and  this was not taken off so they are unsure about the stitch. - Spoke with Colletta Maryland, staff, Caller advised to come to office now / per Dr. Ronnald Ramp. Caller agreeable and they will be right in. Referrals REFERRED TO PCP OFFICE Disagree/Comply: Comply

## 2015-01-12 NOTE — Patient Instructions (Signed)
Biopsy Care After Refer to this sheet in the next few weeks. These instructions provide you with information on caring for yourself after your procedure. Your caregiver may also give you more specific instructions. Your treatment has been planned according to current medical practices, but problems sometimes occur. Call your caregiver if you have any problems or questions after your procedure. If you had a fine needle biopsy, you may have soreness at the biopsy site for 1 to 2 days. If you had an open biopsy, you may have soreness at the biopsy site for 3 to 4 days. HOME CARE INSTRUCTIONS   You may resume normal diet and activities as directed.  Change bandages (dressings) as directed. If your wound was closed with a skin glue (adhesive), it will wear off and begin to peel in 7 days.  Only take over-the-counter or prescription medicines for pain, discomfort, or fever as directed by your caregiver.  Ask your caregiver when you can bathe and get your wound wet. SEEK IMMEDIATE MEDICAL CARE IF:   You have increased bleeding (more than a small spot) from the biopsy site.  You notice redness, swelling, or increasing pain at the biopsy site.  You have pus coming from the biopsy site.  You have a fever.  You notice a bad smell coming from the biopsy site or dressing.  You have a rash, have difficulty breathing, or have any allergic problems. MAKE SURE YOU:   Understand these instructions.  Will watch your condition.  Will get help right away if you are not doing well or get worse. Document Released: 01/07/2005 Document Revised: 09/12/2011 Document Reviewed: 12/16/2010 Exit[employer] Patient Information 2015 [employer]. This information is not intended to replace advice given to you by your health care provider. Make sure you discuss any questions you have with your health care provider.  

## 2015-01-12 NOTE — Progress Notes (Signed)
Pre visit review using our clinic review tool, if applicable. No additional management support is needed unless otherwise documented below in the visit note. 

## 2015-01-12 NOTE — Telephone Encounter (Signed)
Spoke to Dr. Ronnald Ramp per note below. Instructed pt to come back in immediately for MD to recheck site.   Note: stitches were in the shin.

## 2015-01-13 ENCOUNTER — Other Ambulatory Visit: Payer: Self-pay

## 2015-01-13 ENCOUNTER — Encounter: Payer: Self-pay | Admitting: Internal Medicine

## 2015-01-13 MED ORDER — PRAVASTATIN SODIUM 40 MG PO TABS: 40.0000 mg | ORAL_TABLET | Freq: Every evening | ORAL | Status: DC

## 2015-01-13 NOTE — Progress Notes (Signed)
Subjective:  Patient ID: [name], female    DOB: [birth year]  Age: 74 y.o. MRN: [id number]  CC: Rash   HPI [name] presents for the complaint of a one-week history of red spots on her lower legs. The appearance bothers her but they are not symptomatic. She otherwise feels well and offers no other complaints. She was seen in the ER about a week ago and her labs and coags were normal. She she has a history of cancer and ulcerative colitis. She is not taking any aspirin or anti-inflammatories or other anticoagulants.  Outpatient Prescriptions Prior to Visit  Medication Sig Dispense Refill  . aspirin 81 MG tablet Take 81 mg by mouth daily.    . Calcium Carbonate-Vitamin D (CALCIUM 500 + D PO) Take 1 tablet by mouth daily.    . cholestyramine (QUESTRAN) 4 G packet TAKE 1 PACKET (4 G TOTAL) BY MOUTH EVERY EVENING. 30 packet 2  . digoxin (LANOXIN) 0.125 MG tablet Take 1 tablet (125 mcg total) by mouth daily. 90 tablet 1  . furosemide (LASIX) 20 MG tablet Take 1 tablet (20 mg total) by mouth daily. 30 tablet 5  . levETIRAcetam (KEPPRA) 750 MG tablet Take 2 tablets (1,500 mg total) by mouth 2 (two) times daily. 360 tablet 3  . levothyroxine (SYNTHROID, LEVOTHROID) 50 MCG tablet Take 1 tablet (50 mcg total) by mouth every evening. 90 tablet 3  . lidocaine (XYLOCAINE) 2 % solution Take 20 mLs by mouth every 3 (three) hours as needed for mouth pain. Oral pain    . Mesalamine (ASACOL HD) 800 MG TBEC Take 2 tablets (1,600 mg total) by mouth 3 (three) times daily. NEEDS OFFICE VISIT FOR FURTHER REFILLS 540 tablet 0  . Multiple Vitamin (MULTIVITAMIN WITH MINERALS) TABS Take 1 tablet by mouth daily.    Marland Kitchen PHENobarbital (LUMINAL) 97.2 MG tablet TAKE 1 TABLET BY MOUTH ONCE EVERY MORNING (Patient taking differently: TAKE 97.2 MG BY MOUTH EVERY MORNING) 90 tablet 1  . pravastatin (PRAVACHOL) 40 MG tablet Take 1 tablet (40 mg total) by mouth every evening. 90 tablet 1  . predniSONE (DELTASONE)  10 MG tablet Take 20 mg po daily x 2 weeks then decrease by 5 mg every 7 days until off. 100 tablet 1   No facility-administered medications prior to visit.    ROS Review of Systems  Constitutional: Negative.  Negative for fever, chills, diaphoresis, appetite change and fatigue.  HENT: Negative.  Negative for facial swelling, nosebleeds, sinus pressure, sore throat, trouble swallowing and voice change.   Eyes: Negative.  Negative for visual disturbance.  Respiratory: Negative.  Negative for cough, choking, chest tightness, shortness of breath and stridor.   Cardiovascular: Negative.  Negative for chest pain, palpitations and leg swelling.  Gastrointestinal: Positive for diarrhea. Negative for nausea, vomiting, abdominal pain, blood in stool and anal bleeding.  Endocrine: Negative.   Genitourinary: Negative.  Negative for dysuria, urgency, frequency, hematuria, vaginal bleeding and difficulty urinating.  Musculoskeletal: Negative.  Negative for myalgias, back pain, joint swelling and arthralgias.  Skin: Positive for color change and rash. Negative for pallor and wound.  Allergic/Immunologic: Negative.   Neurological: Negative.  Negative for dizziness, tremors, syncope, light-headedness, numbness and headaches.  Hematological: Negative.  Negative for adenopathy. Does not bruise/bleed easily.  Psychiatric/Behavioral: Negative.     Objective:  BP 100/50 mmHg  Pulse 90  Temp(Src) 98.5 F (36.9 C) (Oral)  Ht 5\' 5"  (1.651 m)  Wt 117 lb (53.071 kg)  BMI  19.47 kg/m2  SpO2 90%  BP Readings from Last 3 Encounters:  01/12/15 100/50  01/07/15 109/56  12/22/14 104/68    Wt Readings from Last 3 Encounters:  01/12/15 117 lb (53.071 kg)  12/22/14 117 lb (53.071 kg)  12/03/14 120 lb (54.432 kg)    Physical Exam  Constitutional: She is oriented to person, place, and time.  Non-toxic appearance. She does not have a sickly appearance. She does not appear ill. No distress.  HENT:    Mouth/Throat: Oropharynx is clear and moist. No oropharyngeal exudate.  Eyes: Conjunctivae are normal. Right eye exhibits no discharge. Left eye exhibits no discharge. No scleral icterus.  Neck: Normal range of motion. Neck supple. No JVD present. No tracheal deviation present. No thyromegaly present.  Cardiovascular: Normal rate, regular rhythm, normal heart sounds and intact distal pulses.  Exam reveals no gallop and no friction rub.   No murmur heard. Pulmonary/Chest: Effort normal and breath sounds normal. No stridor. No respiratory distress. She has no wheezes. She has no rales. She exhibits no tenderness.  Abdominal: Soft. Bowel sounds are normal. She exhibits no distension and no mass. There is no tenderness. There is no rebound and no guarding.  Musculoskeletal: Normal range of motion. She exhibits no edema or tenderness.  Lymphadenopathy:    She has no cervical adenopathy.  Neurological: She is oriented to person, place, and time.  Skin: Skin is warm and dry. Rash noted. She is not diaphoretic. There is erythema. No pallor.     Biopsy note; one of the lesions on the left lower extremity in the mid anterior tib-fib area was biopsied. The area was cleaned with Betadine. Local anesthesia was obtained with instillation of 2% lidocaine with epinephrine approximately 1 mL was used. A 6 mm punch incision was made. The specimen was collected and sent for pathology. One simple interrupted suture (4.0 nylon) was placed. Neosporin and a dressing were applied. She tolerated the procedure well.  Additional note. The patient returned after she went home. She complained that there was some bleeding at the site. I rechecked the area and saw that the suture was in place. Hemostasis was obtained by the time she arrived here. We applied another pressure dressing over the area.  Psychiatric: She has a normal mood and affect. Her behavior is normal. Judgment normal.    Lab Results  Component Value Date    WBC 8.0 01/07/2015   HGB 12.1 01/07/2015   HCT 36.6 01/07/2015   PLT 210 01/07/2015   GLUCOSE 103* 01/07/2015   CHOL 165 02/10/2014   TRIG 57.0 02/10/2014   HDL 76.20 02/10/2014   LDLCALC 77 02/10/2014   ALT 15 01/07/2015   AST 19 01/07/2015   NA 132* 01/07/2015   K 3.5 01/07/2015   CL 93* 01/07/2015   CREATININE 0.70 01/07/2015   BUN 7 01/07/2015   CO2 32 01/07/2015   TSH 0.98 09/17/2014   INR 1.24 01/07/2015   HGBA1C 5.7 02/10/2014    No results found.  Assessment & Plan:   Armina was seen today for rash.  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 I await the results of her biopsy specimen. For now I do not feel like any treatment is indicated. Orders: -     Dermatology pathology  Rash and nonspecific skin eruption- biopsy performed and results pending.   I am having Ms. Harten maintain her multivitamin  with minerals, lidocaine, Calcium Carbonate-Vitamin D (CALCIUM 500 + D PO), aspirin, Mesalamine, levothyroxine, pravastatin, PHENobarbital, levETIRAcetam, digoxin, furosemide, predniSONE, and cholestyramine.  No orders of the defined types were placed in this encounter.     Follow-up: Return in about 7 days (around 01/19/2015).  Scarlette Calico, MD

## 2015-01-14 ENCOUNTER — Telehealth: Payer: Self-pay

## 2015-01-14 NOTE — Telephone Encounter (Signed)
Consulted Dr. Ronnald Ramp on incoming phone call. Instructed path per Dr.Jones instructions, path is "routine"

## 2015-01-15 ENCOUNTER — Telehealth: Payer: Self-pay

## 2015-01-15 NOTE — Telephone Encounter (Signed)
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

## 2015-01-19 ENCOUNTER — Ambulatory Visit (INDEPENDENT_AMBULATORY_CARE_PROVIDER_SITE_OTHER): Payer: PPO | Admitting: Internal Medicine

## 2015-01-19 ENCOUNTER — Encounter: Payer: Self-pay | Admitting: Internal Medicine

## 2015-01-19 VITALS — BP 98/60 | HR 88 | Temp 98.2°F | Resp 16 | Ht 65.0 in | Wt 115.0 lb

## 2015-01-19 DIAGNOSIS — K51918 Ulcerative colitis, unspecified with other complication: Secondary | ICD-10-CM

## 2015-01-19 DIAGNOSIS — R197 Diarrhea, unspecified: Secondary | ICD-10-CM | POA: Insufficient documentation

## 2015-01-19 DIAGNOSIS — M310 Hypersensitivity angiitis: Secondary | ICD-10-CM

## 2015-01-19 NOTE — Progress Notes (Signed)
Subjective:  Patient ID: [name], female    DOB: 1941-06-06  Age: 74 y.o. MRN: [id number]  CC: Rash   HPI [name] presents for for a follow-up on her rash and suture removal. She was seen a week ago for relatively asymptomatic rash over her lower extremities. 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 She reports that the rash is resolving. Her only systemic complaint is that she has had a little more diarrhea over the last few weeks than previously. She also feels like she has lost some weight. She does not have any loss of appetite, abdominal pain, nausea, vomiting or blood in her stool.  Outpatient Prescriptions Prior to Visit  Medication Sig Dispense Refill  . aspirin 81 MG tablet Take 81 mg by mouth daily.    . Calcium Carbonate-Vitamin D (CALCIUM 500 + D PO) Take 1 tablet by mouth daily.    . cholestyramine (QUESTRAN) 4 G packet TAKE 1 PACKET (4 G TOTAL) BY MOUTH EVERY EVENING. 30 packet 2  . digoxin (LANOXIN) 0.125 MG tablet Take 1 tablet (125 mcg total) by mouth daily. 90 tablet 1  . furosemide (LASIX) 20 MG tablet Take 1 tablet (20 mg total) by mouth daily. 30 tablet 5  . levETIRAcetam (KEPPRA) 750 MG tablet Take 2 tablets (1,500 mg total) by mouth 2 (two) times daily. 360 tablet 3  . levothyroxine (SYNTHROID, LEVOTHROID) 50 MCG tablet Take 1 tablet (50 mcg total) by mouth every evening. 90 tablet 3  . lidocaine (XYLOCAINE) 2 % solution Take 20 mLs by mouth every 3 (three) hours as needed for mouth pain. Oral pain    . Mesalamine (ASACOL HD) 800 MG TBEC Take 2 tablets (1,600 mg total) by mouth 3 (three) times daily. NEEDS OFFICE VISIT FOR FURTHER REFILLS 540 tablet 0  . Multiple Vitamin (MULTIVITAMIN WITH MINERALS) TABS Take 1 tablet by mouth daily.    Marland Kitchen PHENobarbital (LUMINAL) 97.2 MG tablet TAKE 1 TABLET BY MOUTH ONCE EVERY MORNING (Patient taking differently: TAKE 97.2 MG BY MOUTH EVERY MORNING) 90 tablet  1  . pravastatin (PRAVACHOL) 40 MG tablet Take 1 tablet (40 mg total) by mouth every evening. 90 tablet 1  . predniSONE (DELTASONE) 10 MG tablet Take 20 mg po daily x 2 weeks then decrease by 5 mg every 7 days until off. 100 tablet 1   No facility-administered medications prior to visit.    ROS Review of Systems  Constitutional: Negative.  Negative for fever, chills, diaphoresis, appetite change and fatigue.  HENT: Negative.  Negative for nosebleeds, trouble swallowing and voice change.   Eyes: Negative.   Respiratory: Negative.  Negative for cough, choking, chest tightness, shortness of breath and stridor.   Cardiovascular: Negative.  Negative for chest pain, palpitations and leg swelling.  Gastrointestinal: Positive for diarrhea. Negative for nausea, vomiting, abdominal pain, constipation, blood in stool, abdominal distention, anal bleeding and rectal pain.  Endocrine: Negative.   Genitourinary: Negative.   Musculoskeletal: Negative.   Skin: Positive for rash.  Allergic/Immunologic: Negative.   Neurological: Negative.  Negative for dizziness, seizures and headaches.  Hematological: Negative.  Negative for adenopathy. Does not bruise/bleed easily.  Psychiatric/Behavioral: Negative.  Negative for confusion, dysphoric mood, decreased concentration and agitation.    Objective:  BP 98/60 mmHg  Pulse 88  Temp(Src) 98.2 F (36.8 C) (Oral)  Ht 5\' 5"  (1.651 m)  Wt 115 lb (52.164 kg)  BMI 19.14 kg/m2  SpO2  98%  BP Readings from Last 3 Encounters:  01/19/15 98/60  01/12/15 100/50  01/07/15 109/56    Wt Readings from Last 3 Encounters:  01/19/15 115 lb (52.164 kg)  01/12/15 117 lb (53.071 kg)  12/22/14 117 lb (53.071 kg)    Physical Exam  Constitutional: She is oriented to person, place, and time. No distress.  HENT:  Mouth/Throat: Oropharynx is clear and moist. No oropharyngeal exudate.  Eyes: Conjunctivae are normal. Right eye exhibits no discharge. Left eye exhibits no  discharge. No scleral icterus.  Neck: Normal range of motion. Neck supple. No JVD present. No tracheal deviation present. No thyromegaly present.  Cardiovascular: Normal rate, regular rhythm, normal heart sounds and intact distal pulses.  Exam reveals no gallop and no friction rub.   No murmur heard. Pulmonary/Chest: Effort normal and breath sounds normal. No stridor. No respiratory distress. She has no wheezes. She has no rales. She exhibits no tenderness.  Abdominal: Soft. Bowel sounds are normal. She exhibits no distension. There is no tenderness. There is no rebound and no guarding.  Musculoskeletal: Normal range of motion. She exhibits no edema or tenderness.  Lymphadenopathy:    She has no cervical adenopathy.  Neurological: She is oriented to person, place, and time.  Skin: Skin is warm and dry. Purpura and rash noted. Rash is not macular, not papular, not maculopapular, not nodular, not pustular, not vesicular and not urticarial. She is not diaphoretic. No erythema. No pallor.       Lab Results  Component Value Date   WBC 8.0 01/07/2015   HGB 12.1 01/07/2015   HCT 36.6 01/07/2015   PLT 210 01/07/2015   GLUCOSE 103* 01/07/2015   CHOL 165 02/10/2014   TRIG 57.0 02/10/2014   HDL 76.20 02/10/2014   LDLCALC 77 02/10/2014   ALT 15 01/07/2015   AST 19 01/07/2015   NA 132* 01/07/2015   K 3.5 01/07/2015   CL 93* 01/07/2015   CREATININE 0.70 01/07/2015   BUN 7 01/07/2015   CO2 32 01/07/2015   TSH 0.98 09/17/2014   INR 1.24 01/07/2015   HGBA1C 5.7 02/10/2014    No results found.  Assessment & Plan:   [name] was seen today for rash.  Diagnoses and all orders for this visit:  UC (ulcerative colitis), other complication- I am concerned that she may be having a flare of this and that this may be contributing to the leukocytoclastic vasculitis. Have asked her to follow-up with her gastroenterologist as soon as possible. 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 She is already on systemic steroids. I discussed adding colchicine for additional symptom relief with the patient and her husband but they do not want to start that at this time.  Diarrhea- this is most likely related to the ulcerative colitis but she does have a history of Clostridium difficile infection so will recheck her stool for lactoferrin and C. difficile. Orders: -     Fecal lactoferrin; Future -     Clostridium difficile EIA; Future   I am having [name] maintain her multivitamin with minerals, lidocaine, Calcium Carbonate-Vitamin D (CALCIUM 500 + D PO), aspirin, Mesalamine, levothyroxine, PHENobarbital, levETIRAcetam, digoxin, furosemide, predniSONE, cholestyramine, and pravastatin.  No orders of the defined types were placed in this encounter.     Follow-up: Return in about 4 weeks (around 02/16/2015).  Scarlette Calico, MD

## 2015-01-19 NOTE — Patient Instructions (Signed)
Vasculitis Vasculitis is when your blood vessels are inflamed. There are many different blood vessels in the body, and vasculitis can affect any of them. This includes large (veins and arteries) and small (capillaries) vessels. With vasculitis,   Blood vessel walls can become thick.  Blood vessels can become narrow.  Blood vessels can become weak. Sometimes, it becomes so weak that the blood vessel bulges out like a balloon. This is called an aneurysm. Aneurysms are rare but can be life-threatening.  Scarring can occur.  Not enough blood can flow through the blood vessels. All of these things can damage many parts of the body, including the muscles, kidneys, lungs and brain. There are many types of vasculitis. Some types are short-term (acute), while others are long-term (chronic). Some types may go away without treatment, and others may need to be treated for a long time.  CAUSES  Vasculitis occurs when the body's immune system (which fights germs and disease) makes a mistake. It attacks its own blood vessels. This causes inflammation (the body's way of reacting to injury or infection).   Why this happens is usually not known. The condition is then called primary vasculitis.  Sometimes, something triggers the inflammation. This is called secondary vasculitis. Possible causes include:  Infections.  An immune system disease. Examples include lupus, rheumatoid arthritis and scleroderma.  An allergic reaction to a medicine.  Cancer that affects blood cells. This includes leukemia and lymphoma.  Males and females of all ages and races can develop vasculitis. Some risk factors make vasculitis more likely. These include:  Smoking.  Stress.  Physical injury. SYMPTOMS  There are more than 20 types of vasculitis. Symptoms of each type vary, but some symptoms are common.  Many people with vasculitis:  Have a fever.  Do not feel like eating.  Lose weight.  Feel very tired.  Have  aches and pains.  Feel weak.  Start to not have feeling (numbness) in an area.  Symptoms for some types of vasculitis also could be:  Sores in the mouth or eyes.  Skin problems. This could be sores, spots or rashes.  Trouble seeing.  Trouble breathing.  Blood in the urine.  Headaches.  Pain in the abdomen.  Stuffy or bloody nose. DIAGNOSIS  Vasculitis symptoms are similar to symptoms of many other conditions. That can make it hard to tell if you have vasculitis. To be sure, your caregiver will ask about your symptoms and do a physical exam. Certain tests may be necessary, such as:   A complete blood count (CBC). This test shows how many red blood cells are in your blood. Not having enough red blood cells (anemic) can result from vasculitis.  Erythrocyte sedimentation (also called sed rate test). It measures inflammation in the body.  C reactive protein (CRP). This also shows if there is inflammation.  Anti-neutrophil cytoplasmic antibodies (ANCA). This can tell if the immune system is reacting to certain cells in the blood.  A urine test. This checks for blood or protein in the urine. That could be a sign of kidney damage from vasculitis.  Imaging tests. These tests create pictures from inside the body. Options include:  X-rays.  Computed tomography (CT) scan. This uses X-rays guided by a computer.  Ultrasound. It creates an image using sound waves.  Magnetic resonance imaging (MRI). It uses radio waves, magnets and a computer.  Angiography. A dye is put into your blood vessels. Then, an X-ray is taken of them.  A biopsy of   a blood vessel. This means your caregiver will take out a small piece of a blood vessel. Then, it is checked under a microscope. This is an important test. It often is the best way to know for sure if you have vasculitis. TREATMENT   Treatment will depend on the type of vasculitis and how severe it is. Often, you will need to see a specialist in  immunologic diseases (rheumatologist).  Some types of vasculitis may go away without treatment.  Some types need only over-the-counter drugs.  Prescription medicines are used to treat many types of vasculitis. For example:  Corticosteroids. These are the drugs used most often. They are very powerful. Usually, a high dose is taken until symptoms improve. Then, the dose is gradually decreased. Using corticosteroids for a long time can cause problems. They can make muscles and bones weak. They can cause blood pressure to go up, and cause diabetes. Also, people often gain weight when they take corticosteroids.  Cytotoxic drugs. These kill cells that cause inflammation. Sometimes, they are used if corticosteroids do not help. Other times, both medications are taken.  Surgery. This may be needed to repair a blood vessel that has bulged out (aneurysm).  Treatment can sometimes cure your disease. Other times, it can put the disease in remission (no symptoms). Increased treatment and reevaluation might be necessary if your disease comes back or flares. HOME CARE INSTRUCTIONS   Take any medications that your caregiver prescribes. Follow the directions carefully.  Watch for any problems that can be caused by a drug (side effects). Tell your caregiver right away if you notice any changes or problems.  Keep all appointments for checkups. This is important to help your caregiver watch for side effects. Checkups may include:  Periodic blood tests.  Bone density testing. This checks how strong or weak your bones are.  Blood pressure checks. If your blood pressure rises, you may need to take a drug to control it while you are taking corticosteroids.  Blood sugar checks. This is to be sure you are not developing diabetes. If you have diabetes, corticosteroid medications may make it worse and require increased treatment.  Exercise. First, talk with your caregiver about what would be OK for you to do.  Aerobic exercise (which increases your heart rate) is often suggested. It includes walking. This type of exercise is good because it helps prevent bone loss. It also helps control your blood pressure.  Follow a healthy diet. Include good sources of protein in your diet. Also include fruits, vegetables and whole grains. Your caregiver can refer you to an expert on healthy eating (dietitian) for more detailed advice.  Learn as much as you can about vasculitis. Understanding your condition can help you cope with it. Coping can be hard because this may be something you will have to live with for years.  Consider joining a support group. It often helps to talk about your worries with others who have the same problems.  Tell your caregiver if you feel stressed, anxious or depressed. Your caregiver may refer you to a specialist, or recommend medication to relieve your symptoms. SEEK MEDICAL CARE IF:   The symptoms that led to your diagnosis return.  You develop worsening fever, fatigue, headache, weight loss or pain in your jaw.  You develop signs of infection. Infections can be worse if you are on corticosteroid medication.  You develop any new or unexplained symptoms of disease. SEEK IMMEDIATE MEDICAL CARE IF:   Your eyesight changes.    Pain does not go away, even after taking medication.  You feel pain in your chest or abdomen.  You have trouble breathing.  One side of your face or body becomes suddenly weak or numb.  Your nose bleeds.  There is blood in your urine.  You develop a fever of more than 102 F (38.9 C). Document Released: 04/16/2009 Document Revised: 09/12/2011 Document Reviewed: 08/14/2013 [facility] Patient Information 2015 [place], [place]. This information is not intended to replace advice given to you by your health care provider. Make sure you discuss any questions you have with your health care provider.

## 2015-01-19 NOTE — Progress Notes (Signed)
Pre visit review using our clinic review tool, if applicable. No additional management support is needed unless otherwise documented below in the visit note. 

## 2015-01-22 ENCOUNTER — Other Ambulatory Visit: Payer: Self-pay | Admitting: Internal Medicine

## 2015-01-22 DIAGNOSIS — R569 Unspecified convulsions: Secondary | ICD-10-CM

## 2015-01-22 DIAGNOSIS — I482 Chronic atrial fibrillation, unspecified: Secondary | ICD-10-CM

## 2015-01-22 DIAGNOSIS — I4821 Permanent atrial fibrillation: Secondary | ICD-10-CM

## 2015-01-22 MED ORDER — DIGOXIN 125 MCG PO TABS: 125.0000 ug | ORAL_TABLET | Freq: Every day | ORAL | Status: DC

## 2015-01-27 ENCOUNTER — Other Ambulatory Visit: Payer: Self-pay | Admitting: Internal Medicine

## 2015-01-27 ENCOUNTER — Other Ambulatory Visit: Payer: PPO

## 2015-01-27 DIAGNOSIS — R197 Diarrhea, unspecified: Secondary | ICD-10-CM

## 2015-01-28 ENCOUNTER — Telehealth: Payer: Self-pay | Admitting: Internal Medicine

## 2015-01-28 ENCOUNTER — Telehealth: Payer: Self-pay

## 2015-01-28 ENCOUNTER — Other Ambulatory Visit: Payer: Self-pay | Admitting: Internal Medicine

## 2015-01-28 ENCOUNTER — Encounter: Payer: Self-pay | Admitting: Internal Medicine

## 2015-01-28 DIAGNOSIS — A0472 Enterocolitis due to Clostridium difficile, not specified as recurrent: Secondary | ICD-10-CM

## 2015-01-28 LAB — CLOSTRIDIUM DIFFICILE BY PCR: CDIFFPCR: DETECTED — AB

## 2015-01-28 LAB — C. DIFFICILE GDH AND TOXIN A/B
C. difficile GDH: DETECTED — AB
C. difficile Toxin A/B: NOT DETECTED

## 2015-01-28 LAB — FECAL LACTOFERRIN, QUANT: LACTOFERRIN: POSITIVE

## 2015-01-28 MED ORDER — VANCOMYCIN HCL 125 MG PO CAPS: 125.0000 mg | ORAL_CAPSULE | Freq: Four times a day (QID) | ORAL | Status: DC

## 2015-01-28 NOTE — Telephone Encounter (Signed)
Patients husband called regarding the prescription of vancomycin. She has taken this before in the past and he is wondering if she should start probiotics as well. Please call husband at [phone]

## 2015-01-28 NOTE — Telephone Encounter (Signed)
Caren Griffins from the lab: critical result  Positive for C Diff by PCR.

## 2015-01-29 NOTE — Telephone Encounter (Signed)
yes

## 2015-01-29 NOTE — Telephone Encounter (Signed)
Returned call to husband, no answer. I will notify via mychart message.

## 2015-01-29 NOTE — Telephone Encounter (Signed)
Please advise on probiotics?

## 2015-02-05 ENCOUNTER — Telehealth: Payer: Self-pay | Admitting: Internal Medicine

## 2015-02-05 NOTE — Telephone Encounter (Signed)
She needs to see her gastroenterologist soon

## 2015-02-05 NOTE — Telephone Encounter (Signed)
Patient has been on the vancomycin for eight days now and she is still experiencing diarrhea. Not as often, but sometimes its so bad she doesn't make it to the bathroom. Wondering if she should give it the whole two weeks or is there something else that should be done. Please call husband at [phone].

## 2015-02-05 NOTE — Telephone Encounter (Signed)
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 Pt was seen by PCP last Wednesday and was found to have cdiff again. Dr. Ronnald Ramp started her on Vancomycin 125mg  capsules qid for 14 days. Pts husband states that she has been on this for 8 days now but she is still having diarrhea. Reports it is a little better but she still may have 6-8 diarrhea stools today and at times there is urgency and she may not make it to the bathroom on time. State they called PCP office and were told to get in touch with GI doc asap. Should pt have longer course of vanc? Can pt take Imodium to help with diarrhea? Dr. Deatra Ina as doc of the day please advise.

## 2015-02-05 NOTE — Telephone Encounter (Signed)
Patient husband notified via mychart.

## 2015-02-06 NOTE — Telephone Encounter (Signed)
Spoke with pts husband and he is aware. 

## 2015-02-06 NOTE — Telephone Encounter (Signed)
Continue vancomycin for the full 14 days.  If she still having diarrhea she should call back early next week

## 2015-02-09 ENCOUNTER — Telehealth: Payer: Self-pay | Admitting: Internal Medicine

## 2015-02-09 NOTE — Telephone Encounter (Signed)
Pyrtle pt with Cdiff. Pt will finish vancomycin pills that PCP gave them tomorrow. Pts husband states pt is still having 6-8 diarrhea stools per day. Husband states pt is very weak. She has been on Vancomycin 125mg  QID for 14 days. Dr. Ardis Hughs as doc of the day please advise.

## 2015-02-10 ENCOUNTER — Ambulatory Visit: Payer: PPO | Admitting: Physician Assistant

## 2015-02-10 ENCOUNTER — Ambulatory Visit (INDEPENDENT_AMBULATORY_CARE_PROVIDER_SITE_OTHER): Payer: PPO | Admitting: Physician Assistant

## 2015-02-10 ENCOUNTER — Encounter: Payer: Self-pay | Admitting: Physician Assistant

## 2015-02-10 ENCOUNTER — Other Ambulatory Visit: Payer: Self-pay

## 2015-02-10 ENCOUNTER — Ambulatory Visit: Payer: PPO | Admitting: Oncology

## 2015-02-10 ENCOUNTER — Other Ambulatory Visit (INDEPENDENT_AMBULATORY_CARE_PROVIDER_SITE_OTHER): Payer: PPO

## 2015-02-10 VITALS — BP 142/60 | HR 72 | Temp 98.1°F | Ht 65.0 in | Wt 115.1 lb

## 2015-02-10 DIAGNOSIS — R197 Diarrhea, unspecified: Secondary | ICD-10-CM

## 2015-02-10 DIAGNOSIS — K5190 Ulcerative colitis, unspecified, without complications: Secondary | ICD-10-CM | POA: Diagnosis not present

## 2015-02-10 DIAGNOSIS — A0472 Enterocolitis due to Clostridium difficile, not specified as recurrent: Secondary | ICD-10-CM

## 2015-02-10 DIAGNOSIS — A047 Enterocolitis due to Clostridium difficile: Secondary | ICD-10-CM

## 2015-02-10 LAB — CBC WITH DIFFERENTIAL/PLATELET
BASOS PCT: 0.2 % (ref 0.0–3.0)
Basophils Absolute: 0 10*3/uL (ref 0.0–0.1)
EOS ABS: 0.1 10*3/uL (ref 0.0–0.7)
EOS PCT: 0.9 % (ref 0.0–5.0)
HEMATOCRIT: 35.8 % — AB (ref 36.0–46.0)
Hemoglobin: 11.9 g/dL — ABNORMAL LOW (ref 12.0–15.0)
Lymphocytes Relative: 25.6 % (ref 12.0–46.0)
Lymphs Abs: 4 10*3/uL (ref 0.7–4.0)
MCHC: 33.3 g/dL (ref 30.0–36.0)
MCV: 98 fl (ref 78.0–100.0)
Monocytes Absolute: 0.9 10*3/uL (ref 0.1–1.0)
Monocytes Relative: 6 % (ref 3.0–12.0)
NEUTROS ABS: 10.6 10*3/uL — AB (ref 1.4–7.7)
Neutrophils Relative %: 67.3 % (ref 43.0–77.0)
PLATELETS: 296 10*3/uL (ref 150.0–400.0)
RBC: 3.65 Mil/uL — ABNORMAL LOW (ref 3.87–5.11)
RDW: 17.2 % — AB (ref 11.5–15.5)
WBC: 15.8 10*3/uL — AB (ref 4.0–10.5)

## 2015-02-10 LAB — BASIC METABOLIC PANEL
BUN: 11 mg/dL (ref 6–23)
CO2: 33 mEq/L — ABNORMAL HIGH (ref 19–32)
Calcium: 9.1 mg/dL (ref 8.4–10.5)
Chloride: 93 mEq/L — ABNORMAL LOW (ref 96–112)
Creatinine, Ser: 0.73 mg/dL (ref 0.40–1.20)
GFR: 82.25 mL/min (ref >60.00)
Glucose, Bld: 99 mg/dL (ref 70–99)
Potassium: 4.1 mEq/L (ref 3.5–5.1)
Sodium: 131 mEq/L — ABNORMAL LOW (ref 135–145)

## 2015-02-10 LAB — IBC PANEL
Iron: 45 ug/dL (ref 42–145)
Saturation Ratios: 20.5 % (ref 20.0–50.0)
Transferrin: 157 mg/dL — ABNORMAL LOW (ref 212.0–360.0)

## 2015-02-10 LAB — FERRITIN: FERRITIN: 52.1 ng/mL (ref 10.0–291.0)

## 2015-02-10 LAB — HIGH SENSITIVITY CRP: CRP, High Sensitivity: 28.98 mg/L — ABNORMAL HIGH (ref 0.000–5.000)

## 2015-02-10 MED ORDER — VANCOMYCIN HCL 125 MG PO CAPS
ORAL_CAPSULE | ORAL | Status: DC
Start: 2015-02-10 — End: 2015-02-11

## 2015-02-10 MED ORDER — SACCHAROMYCES BOULARDII 250 MG PO CAPS: 500.0000 mg | ORAL_CAPSULE | Freq: Two times a day (BID) | ORAL | Status: DC

## 2015-02-10 NOTE — Patient Instructions (Addendum)
We have sent the following medications to your pharmacy for you to pick up at your convenience: Vancomycin and Florastor.    Your physician has requested that you go to the basement for the following lab work before leaving today: CRP, Ferritin, and IBC panel.

## 2015-02-10 NOTE — Telephone Encounter (Signed)
She really isn't responding as I'd expect for c. Diff.  I think she needs rov this week, any provider.  In meantime start one imodium twice daily (OTC), needs cbc, bmet as well.  Push fluids.

## 2015-02-10 NOTE — Telephone Encounter (Signed)
Spoke with husband and he is aware, pt to see Lori Hvozdovic, PA-C today at 2:45pm. Orders in epic.

## 2015-02-10 NOTE — Progress Notes (Addendum)
Patient ID: [name], female   DOB: 10-22-1940, 74 y.o.   MRN: [id number]     History of Present Illness: [name] who is known to Dr. Hilarie Fredrickson with a past medical history of ulcerative colitis, history of C. difficile colitis, laryngeal cancer status post laryngectomy, hip and shoulder fracture complicated by right shoulder hardware infection. She was seen in February 2016 with signs of an active colitis flare. C. difficile was ruled out and she was started on a prednisone taper with resolution of her symptoms. She was recently evaluated by Dr. 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 This raises the question as to whether this may be indicative of a flare of her UC. Patient has been having diarrhea but states she never has formed stools with her ulcerative colitis. She was sent for a stool for C. difficile on 01/27/2015 which was detected. A C. difficile by PCR was obtained as confirmation and this too was positive. She was started on vancomycin 125 mg 4 times a day for 14 days. She is on her 13th day. She was initially having 5-8 watery bowel movements daily but over the weekend this slow 245. So far today she had 2 mushy bowel movements through the night and one mushy bowel movement early this morning but none since. She has had intermittent blood streaking on the toilet tissue. She denies abdominal pain, nausea, vomiting, fever, chills, or night sweats. She denies tenesmus or mucus with her stools. Her husband states they opted to try to let the vasculitis run its course so was not add any additional medications to her daily regimen.   Past Medical History  Diagnosis Date  . Peripheral vascular disease   . Subdural hematoma 1990s    s/p fall in bathtub  . Mild dysplasia of cervix   . UTI (lower urinary tract infection)   . Urinary incontinence   . Cerebrovascular disease 2000  . Personal history of colonic polyps     adenomatous 1997 & tubular adenoma  and hyplastic  2008  . Hyperlipidemia     takes Pravastatin daily  . H/O alcohol abuse   . Diverticulosis of colon (without mention of hemorrhage)   . Redundant colon   . C. difficile colitis   . Ulcerative colitis     takes [place] daily  . Permanent atrial fibrillation   . Seizures     takes Phenobarbital and Keppra daily;last seizure was April 1,2014  . Stroke 2000    off balance on right side a little and peripheral vision loss on right side  . Arthritis   . Joint pain   . Joint swelling   . History of UTI 10-2012  . Hypothyroidism     takes Levothyroxine daily  . Squamous cell carcinoma of mouth   . Adenocarcinoma, breast     bilateral  . Anxiety   . Dementia   . Cancer of larynx   . Hypertension   . Gait disorder   . Oropharyngeal cancer   . Pneumonia 2/16  . Cellulitis     Past Surgical History  Procedure Laterality Date  . Subdural hematoma evacuation via craniotomy    . Oral surgery for squamous cell carcinoma of the mouth      x 3  . Multiple tooth extractions      due to oral cancer  . Cataract extraction, bilateral    . Breast lumpectomy      left breast with radiation  therapy  . Carotid endarterectomy      left  . Mastectomy      Right, history with nodule dissection  . Orif hip fracture  Sept '12    Right hip: screw and plate repair.  . Larynx surgery  08/2010    [facility] x 2  . Tubal ligation    . Colonoscopy    . Orif humerus fracture Right 10/30/2012    Procedure: OPEN REDUCTION INTERNAL FIXATION (ORIF) HUMERAL SHAFT FRACTURE;  Surgeon: Hessie Dibble, MD;  Location: [place];  Service: Orthopedics;  Laterality: Right;  BIG C-ARM, SHOULDER POSITION  . Hammer toe surgery Right 03/2013    pins and screws  . Flexible sigmoidoscopy N/A 06/21/2013    Procedure: FLEXIBLE SIGMOIDOSCOPY;  Surgeon: Jerene Bears, MD;  Location: [facility];  Service: Gastroenterology;  Laterality: N/A;  . Hardware removal Right 11/14/2013    Procedure: RIGHT SHOUDER HARDWARE  REMOVAL;  Surgeon: Nita Sells, MD;  Location: [place];  Service: Orthopedics;  Laterality: Right;  . Incision and drainage Right 11/14/2013    Procedure: INCISION AND DRAINAGE;  Surgeon: Nita Sells, MD;  Location: [place];  Service: Orthopedics;  Laterality: Right;  . Shoulder surgery  10/2013   Family History  Problem Relation Age of Onset  . Coronary artery disease Mother   . Heart disease Mother   . Diabetes Sister   . Breast cancer Maternal Aunt   . Breast cancer Sister   . Colon cancer      nephew (sister's son)   History  Substance Use Topics  . Smoking status: Former Smoker -- 72 years    Quit date: 10/26/2004  . Smokeless tobacco: Never Used     Comment: quit in 2007  . Alcohol Use: 0.0 oz/week    0 Standard drinks or equivalent per week     Comment: normally 1 glass wine after dinner - occasional   Current Outpatient Prescriptions  Medication Sig Dispense Refill  . aspirin 81 MG tablet Take 81 mg by mouth daily.    . Calcium Carbonate-Vitamin D (CALCIUM 500 + D PO) Take 1 tablet by mouth daily.    . cholestyramine (QUESTRAN) 4 G packet TAKE 1 PACKET (4 G TOTAL) BY MOUTH EVERY EVENING. 30 packet 2  . digoxin (LANOXIN) 0.125 MG tablet Take 1 tablet (125 mcg total) by mouth daily. 90 tablet 1  . furosemide (LASIX) 20 MG tablet Take 1 tablet (20 mg total) by mouth daily. 30 tablet 5  . levETIRAcetam (KEPPRA) 750 MG tablet Take 2 tablets (1,500 mg total) by mouth 2 (two) times daily. 360 tablet 3  . levothyroxine (SYNTHROID, LEVOTHROID) 50 MCG tablet Take 1 tablet (50 mcg total) by mouth every evening. 90 tablet 3  . lidocaine (XYLOCAINE) 2 % solution Take 20 mLs by mouth every 3 (three) hours as needed for mouth pain. Oral pain    . Mesalamine (ASACOL HD) 800 MG TBEC Take 2 tablets (1,600 mg total) by mouth 3 (three) times daily. NEEDS OFFICE VISIT FOR FURTHER REFILLS 540 tablet 0  . Multiple Vitamin (MULTIVITAMIN WITH MINERALS) TABS Take 1 tablet by mouth  daily.    Marland Kitchen PHENobarbital (LUMINAL) 97.2 MG tablet TAKE 1 TABLET BY MOUTH ONCE EVERY MORNING (Patient taking differently: TAKE 97.2 MG BY MOUTH EVERY MORNING) 90 tablet 1  . pravastatin (PRAVACHOL) 40 MG tablet Take 1 tablet (40 mg total) by mouth every evening. 90 tablet 1  . vancomycin (VANCOCIN) 125 MG capsule Take  1 capsule (125 mg total) by mouth 4 (four) times daily. 56 capsule 0   No current facility-administered medications for this visit.   Allergies  Allergen Reactions  . Xarelto [Rivaroxaban]     Suffered a severe bleed  . Diltiazem Other (See Comments)    edema      Re iew of Systems: Constitutional: Negative. Negative for fever, chills, diaphoresis, appetite change and fatigue.  HENT: Negative. Negative for nosebleeds, trouble swallowing and voice change.  Eyes: Negative.  Respiratory: Negative. Negative for cough, choking, chest tightness, shortness of breath and stridor.  Cardiovascular: Negative. Negative for chest pain, palpitations and leg swelling.  Gastrointestinal: Positive for diarrhea. Negative for nausea, vomiting, abdominal pain, constipation, abdominal distention, anal bleeding and rectal pain. Has had occasional blood on toilet tissue. Endocrine: Negative.  Genitourinary: Negative.  Musculoskeletal: Negative.  Skin: Positive for rash.  Allergic/Immunologic: Negative.  Neurological: Negative. Negative for dizziness, seizures.  Hematological: Negative. Negative for adenopathy. Does not bruise/bleed easily.     LAB RESULTS: C. difficile G DH on 01/27/2015 detected Stool differential PCR on 01/27/2015 detected. CBC 09/17/2014 white count 13.6, hemoglobin 14.3, hematocrit 42.2, platelets 211,000. CBC 01/07/2015 white count 8, hemoglobin 12.1, hematocrit 36.6, platelets 210,000.  Physical Exam: General: Pleasant, well developed , pale Caucasian female in no acute distress Head: Normocephalic and atraumatic Eyes:  sclerae anicteric,  conjunctiva pink  Ears: Normal auditory acuity Lungs: Clear throughout to auscultation Heart: Regular rate and rhythm Abdomen: Soft, non distended, non-tender. No masses, no hepatomegaly. Normal bowel sounds Rectal: Brown stool, heme positive. Musculoskeletal: Symmetrical with no gross deformities  Extremities: No edema  Neurological: Alert oriented x 4, grossly nonfocal Psychological:  Alert and cooperative. Normal mood and affect  Assessment and Recommendations: 74 year old female with a history of ulcerative colitis, C. difficile colitis, laryngeal cancer status post laryngectomy, hip and shoulder fracture complicated by right shoulder hardware infection, now with recurrent C. difficile. We will start patient on a taper of vancomycin 125 mg orally 4 times daily for 7 days, then 125 mg orally twice daily for 7 days, then 125 mg orally once daily for 7 days, then 125 mg orally every other day for 7 days, then 125 mg orally every third day for 14 days. Patient will also use florastor 250 mg 2 capsules twice daily for 6 weeks. She has been noted to have a drop in her hemoglobin from March to July, and we will recheck her CBC with iron studies today. She will return in 3 weeks, sooner if needed.   Yannely Kintzel, Vita Barley PA-C 02/10/2015,   Addendum: Reviewed and agree with initial management. Jerene Bears, MD

## 2015-02-11 ENCOUNTER — Telehealth: Payer: Self-pay | Admitting: Physician Assistant

## 2015-02-11 ENCOUNTER — Other Ambulatory Visit (INDEPENDENT_AMBULATORY_CARE_PROVIDER_SITE_OTHER): Payer: PPO

## 2015-02-11 DIAGNOSIS — R197 Diarrhea, unspecified: Secondary | ICD-10-CM | POA: Diagnosis not present

## 2015-02-11 LAB — URINALYSIS, ROUTINE W REFLEX MICROSCOPIC
Bilirubin Urine: NEGATIVE
HGB URINE DIPSTICK: NEGATIVE
Ketones, ur: NEGATIVE
LEUKOCYTES UA: NEGATIVE
Nitrite: NEGATIVE
SPECIFIC GRAVITY, URINE: 1.015 (ref 1.000–1.030)
Total Protein, Urine: NEGATIVE
UROBILINOGEN UA: 0.2 (ref 0.0–1.0)
Urine Glucose: NEGATIVE
pH: 5.5 (ref 5.0–8.0)

## 2015-02-11 MED ORDER — VANCOMYCIN 50 MG/ML ORAL SOLUTION: ORAL | Status: DC

## 2015-02-11 NOTE — Telephone Encounter (Signed)
Patient notified

## 2015-02-11 NOTE — Telephone Encounter (Signed)
Prescription sent in for liquid vancomycin.

## 2015-02-11 NOTE — Telephone Encounter (Signed)
Can we send in liquid Vancomycin?

## 2015-02-11 NOTE — Telephone Encounter (Signed)
Sure

## 2015-02-11 NOTE — Telephone Encounter (Signed)
Prescription refaxed to [facility].

## 2015-02-13 ENCOUNTER — Telehealth: Payer: Self-pay | Admitting: Internal Medicine

## 2015-02-13 MED ORDER — COLCHICINE 0.6 MG PO TABS: 0.6000 mg | ORAL_TABLET | Freq: Two times a day (BID) | ORAL | Status: DC

## 2015-02-13 NOTE — Telephone Encounter (Signed)
Spoke with pt. Had an OV with Dr. Ronnald Ramp on 7/18. Dx was leukocytoclastic vasculitis. Pt stated that they declined Colchicine at the time due to being on other meds at the time and was hoping it would go away on its own. Pt stated that it did up until the beginning of this week. Now the spots have returned and would like something sent in. No pain, same symptoms as before just a rash. Pt did test positive for C. Diff. Wants to know if script can be sent to pharmacy. Pt is scheduled to come in on Monday 8/15

## 2015-02-13 NOTE — Telephone Encounter (Signed)
LMOVM. Called to inform pt of script being sent to pharmacy

## 2015-02-13 NOTE — Telephone Encounter (Signed)
Per previous notes and UptoDate will start Colchicine secondary to already being started on systemic steroids. Follow up with Dr. Ronnald Ramp on Monday as scheduled. If she is not able to tolerate 2x daily, have her take 1 daily until Monday.

## 2015-02-13 NOTE — Telephone Encounter (Signed)
Pt has an OV on 8/15 with Dr. Ronnald Ramp

## 2015-02-13 NOTE — Telephone Encounter (Signed)
Pt husband called in said that pt has vasculitis on both legs again and Dr Ronnald Ramp told them to call if it came back.     Best number [phone]

## 2015-02-16 ENCOUNTER — Other Ambulatory Visit (INDEPENDENT_AMBULATORY_CARE_PROVIDER_SITE_OTHER): Payer: PPO

## 2015-02-16 ENCOUNTER — Ambulatory Visit (INDEPENDENT_AMBULATORY_CARE_PROVIDER_SITE_OTHER): Payer: PPO | Admitting: Internal Medicine

## 2015-02-16 ENCOUNTER — Encounter: Payer: Self-pay | Admitting: Internal Medicine

## 2015-02-16 VITALS — BP 102/58 | HR 61 | Temp 98.0°F | Resp 16 | Wt 113.0 lb

## 2015-02-16 DIAGNOSIS — I10 Essential (primary) hypertension: Secondary | ICD-10-CM | POA: Diagnosis not present

## 2015-02-16 DIAGNOSIS — A047 Enterocolitis due to Clostridium difficile: Secondary | ICD-10-CM | POA: Diagnosis not present

## 2015-02-16 DIAGNOSIS — R197 Diarrhea, unspecified: Secondary | ICD-10-CM

## 2015-02-16 DIAGNOSIS — Z0000 Encounter for general adult medical examination without abnormal findings: Secondary | ICD-10-CM

## 2015-02-16 DIAGNOSIS — E038 Other specified hypothyroidism: Secondary | ICD-10-CM

## 2015-02-16 DIAGNOSIS — A0472 Enterocolitis due to Clostridium difficile, not specified as recurrent: Secondary | ICD-10-CM

## 2015-02-16 DIAGNOSIS — M310 Hypersensitivity angiitis: Secondary | ICD-10-CM

## 2015-02-16 LAB — LIPID PANEL
CHOLESTEROL: 131 mg/dL (ref 0–200)
HDL: 52.8 mg/dL (ref >39.00)
LDL Cholesterol: 61 mg/dL (ref 0–99)
NonHDL: 78
Total CHOL/HDL Ratio: 2
Triglycerides: 83 mg/dL (ref 0.0–149.0)
VLDL: 16.6 mg/dL (ref 0.0–40.0)

## 2015-02-16 LAB — BASIC METABOLIC PANEL
BUN: 9 mg/dL (ref 6–23)
CO2: 31 mEq/L (ref 19–32)
CREATININE: 0.66 mg/dL (ref 0.40–1.20)
Calcium: 8.8 mg/dL (ref 8.4–10.5)
Chloride: 95 mEq/L — ABNORMAL LOW (ref 96–112)
GFR: 92.39 mL/min (ref >60.00)
Glucose, Bld: 92 mg/dL (ref 70–99)
Potassium: 4.4 mEq/L (ref 3.5–5.1)
SODIUM: 131 meq/L — AB (ref 135–145)

## 2015-02-16 LAB — TSH: TSH: 3.73 u[IU]/mL (ref 0.35–4.50)

## 2015-02-16 MED ORDER — DIPHENOXYLATE-ATROPINE 2.5-0.025 MG PO TABS: 1.0000 | ORAL_TABLET | Freq: Four times a day (QID) | ORAL | Status: DC | PRN

## 2015-02-16 MED ORDER — COLCHICINE 0.6 MG PO TABS: 0.6000 mg | ORAL_TABLET | Freq: Two times a day (BID) | ORAL | Status: DC

## 2015-02-16 NOTE — Progress Notes (Signed)
Subjective:  Patient ID: [name], female    DOB: 16-Jan-1941  Age: 74 y.o. MRN: [id number]  CC: Annual Exam   HPI [name] presents for a physical but also complains of profuse, watery diarrhea with some episodes of soiling and incontinence. She was placed on cholestyramine about 6 weeks ago but she says that that has not helped. Her husband has given her a few doses of Imodium which helps briefly. She called the office last week because the red spots on her legs were worsening and she has been started on colchicine. She and her husband report that the red spots are less numerous and less symptomatic over the last 3 days since she started colchicine.. She does complain of edema in both lower extremities.  Outpatient Prescriptions Prior to Visit  Medication Sig Dispense Refill  . aspirin 81 MG tablet Take 81 mg by mouth daily.    . Calcium Carbonate-Vitamin D (CALCIUM 500 + D PO) Take 1 tablet by mouth daily.    . digoxin (LANOXIN) 0.125 MG tablet Take 1 tablet (125 mcg total) by mouth daily. 90 tablet 1  . furosemide (LASIX) 20 MG tablet Take 1 tablet (20 mg total) by mouth daily. 30 tablet 5  . levETIRAcetam (KEPPRA) 750 MG tablet Take 2 tablets (1,500 mg total) by mouth 2 (two) times daily. 360 tablet 3  . levothyroxine (SYNTHROID, LEVOTHROID) 50 MCG tablet Take 1 tablet (50 mcg total) by mouth every evening. 90 tablet 3  . lidocaine (XYLOCAINE) 2 % solution Take 20 mLs by mouth every 3 (three) hours as needed for mouth pain. Oral pain    . Mesalamine (ASACOL HD) 800 MG TBEC Take 2 tablets (1,600 mg total) by mouth 3 (three) times daily. NEEDS OFFICE VISIT FOR FURTHER REFILLS 540 tablet 0  . Multiple Vitamin (MULTIVITAMIN WITH MINERALS) TABS Take 1 tablet by mouth daily.    Marland Kitchen PHENobarbital (LUMINAL) 97.2 MG tablet TAKE 1 TABLET BY MOUTH ONCE EVERY MORNING (Patient taking differently: TAKE 97.2 MG BY MOUTH EVERY MORNING) 90 tablet 1  . pravastatin (PRAVACHOL) 40 MG tablet  Take 1 tablet (40 mg total) by mouth every evening. 90 tablet 1  . saccharomyces boulardii (FLORASTOR) 250 MG capsule Take 2 capsules (500 mg total) by mouth 2 (two) times daily. 120 capsule 1  . vancomycin (VANCOCIN) 50 mg/mL oral solution 2.5 ml (0.5 tablespoon) qid x 7 days then bid x 7 days then qd x 7 days then qod x 7 days then every 3rd day for 14 days. 145 mL 0  . cholestyramine (QUESTRAN) 4 G packet TAKE 1 PACKET (4 G TOTAL) BY MOUTH EVERY EVENING. 30 packet 2  . colchicine 0.6 MG tablet Take 1 tablet (0.6 mg total) by mouth 2 (two) times daily. 10 tablet 0   No facility-administered medications prior to visit.    ROS Review of Systems  Constitutional: Positive for fatigue. Negative for fever, chills, diaphoresis, activity change, appetite change and unexpected weight change.  HENT: Negative.   Eyes: Negative.   Respiratory: Negative.  Negative for cough, choking, chest tightness, shortness of breath and stridor.   Cardiovascular: Negative.  Negative for chest pain, palpitations and leg swelling.  Gastrointestinal: Positive for diarrhea. Negative for nausea, vomiting, abdominal pain, constipation, blood in stool, abdominal distention, anal bleeding and rectal pain.  Endocrine: Negative.   Genitourinary: Negative.  Negative for difficulty urinating.  Musculoskeletal: Negative.  Negative for myalgias, back pain and arthralgias.  Skin: Positive for rash.  Negative for color change, pallor and wound.  Allergic/Immunologic: Negative.   Neurological: Positive for dizziness and weakness (chronic, unchanged weakness in her right upper and lower extremities.).  Hematological: Negative.  Negative for adenopathy. Does not bruise/bleed easily.  Psychiatric/Behavioral: Negative.     Objective:  BP 102/58 mmHg  Pulse 61  Temp(Src) 98 F (36.7 C) (Oral)  Resp 16  Wt 113 lb (51.256 kg)  BP Readings from Last 3 Encounters:  02/16/15 102/58  02/10/15 142/60  01/19/15 98/60    Wt  Readings from Last 3 Encounters:  02/16/15 113 lb (51.256 kg)  02/10/15 115 lb 2 oz (52.22 kg)  01/19/15 115 lb (52.164 kg)    Physical Exam  Constitutional: She appears well-developed and well-nourished.  Non-toxic appearance. She does not have a sickly appearance. She does not appear ill. No distress.  HENT:  Mouth/Throat: Oropharynx is clear and moist. No oropharyngeal exudate.  Eyes: Conjunctivae are normal. Right eye exhibits no discharge. Left eye exhibits no discharge. No scleral icterus.  Neck: Normal range of motion. Neck supple. No JVD present. No tracheal deviation present. No thyromegaly present.  Cardiovascular: Normal rate, regular rhythm, normal heart sounds and intact distal pulses.  Exam reveals no gallop and no friction rub.   No murmur heard. Pulmonary/Chest: Effort normal and breath sounds normal. No stridor. No respiratory distress. She has no wheezes. She has no rales. She exhibits no tenderness.  Abdominal: Soft. Bowel sounds are normal. She exhibits no distension and no mass. There is no tenderness. There is no rebound and no guarding.  Musculoskeletal: Normal range of motion. She exhibits edema (2+ pitting edema in BLE). She exhibits no tenderness.  Lymphadenopathy:    She has no cervical adenopathy.  Neurological: She is alert. She displays abnormal reflex. She displays no atrophy and no tremor. No cranial nerve deficit or sensory deficit. She exhibits abnormal muscle tone. She displays no seizure activity. Coordination and gait abnormal.  Reflex Scores:      Tricep reflexes are 1+ on the right side and 1+ on the left side.      Bicep reflexes are 1+ on the right side and 1+ on the left side.      Brachioradialis reflexes are 1+ on the right side and 1+ on the left side.      Patellar reflexes are 1+ on the right side and 0 on the left side.      Achilles reflexes are 1+ on the right side and 0 on the left side. She has mildly dished decreased strength and  coordination in the right upper and lower extremity  Skin: Skin is warm and dry. Purpura and rash noted. No abrasion, no ecchymosis and no petechiae noted. Rash is not macular, not papular, not maculopapular, not nodular, not pustular, not vesicular and not urticarial. She is not diaphoretic. No erythema. No pallor.  There are scattered, erythematous purpura on the lower extremities. Lesions are too numerous to count.  Psychiatric: She has a normal mood and affect. Her behavior is normal. Judgment and thought content normal.  Vitals reviewed.   Lab Results  Component Value Date   WBC 15.8* 02/10/2015   HGB 11.9* 02/10/2015   HCT 35.8* 02/10/2015   PLT 296.0 02/10/2015   GLUCOSE 92 02/16/2015   CHOL 131 02/16/2015   TRIG 83.0 02/16/2015   HDL 52.80 02/16/2015   LDLCALC 61 02/16/2015   ALT 15 01/07/2015   AST 19 01/07/2015   NA 131* 02/16/2015   K  4.4 02/16/2015   CL 95* 02/16/2015   CREATININE 0.66 02/16/2015   BUN 9 02/16/2015   CO2 31 02/16/2015   TSH 3.73 02/16/2015   INR 1.24 01/07/2015   HGBA1C 5.7 02/10/2014    No results found.  Assessment & Plan:   [name] was seen today for annual exam.  Diagnoses and all orders for this visit:  Other specified hypothyroidism- her TSH is in the normal range, she will stay on the current dose of levothyroxine -     Lipid panel; Future -     TSH; Future  Essential hypertension- her blood pressure is well-controlled, she has lower extremity edema so Avastin to elevate her lower extremities, will continue Lasix -     Basic metabolic panel; Future  Diarrhea- cholestyramine has not helped so I have asked her to discontinue it, will try Lomotil -     diphenoxylate-atropine (LOMOTIL) 2.5-0.025 MG per tablet; Take 1 tablet by mouth 4 (four) times daily as needed for diarrhea or loose stools.  Clostridium difficile colitis- she is tapering down on vancomycin, will try Lomotil for the diarrhea -     diphenoxylate-atropine (LOMOTIL)  2.5-0.025 MG per tablet; Take 1 tablet by mouth 4 (four) times daily as needed for diarrhea or loose stools.  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 -     colchicine 0.6 MG tablet; Take 1 tablet (0.6 mg total) by mouth 2 (two) times daily.  I have discontinued [name]'s cholestyramine. I am also having her start on diphenoxylate-atropine. Additionally, I am having her maintain her multivitamin with minerals, lidocaine, Calcium Carbonate-Vitamin D (CALCIUM 500 + D PO), aspirin, Mesalamine, levothyroxine, PHENobarbital, levETIRAcetam, furosemide, pravastatin, digoxin, saccharomyces boulardii, vancomycin, and colchicine.  Meds ordered this encounter  Medications  . diphenoxylate-atropine (LOMOTIL) 2.5-0.025 MG per tablet    Sig: Take 1 tablet by mouth 4 (four) times daily as needed for diarrhea or loose stools.    Dispense:  60 tablet    Refill:  2  . colchicine 0.6 MG tablet    Sig: Take 1 tablet (0.6 mg total) by mouth 2 (two) times daily.    Dispense:  60 tablet    Refill:  2     Follow-up: Return in about 3 months (around 05/19/2015).  Scarlette Calico, MD

## 2015-02-16 NOTE — Progress Notes (Signed)
Pre visit review using our clinic review tool, if applicable. No additional management support is needed unless otherwise documented below in the visit note. 

## 2015-02-16 NOTE — Patient Instructions (Signed)
Preventive Care for Adults A healthy lifestyle and preventive care can promote health and wellness. Preventive health guidelines for women include the following key practices.  A routine yearly physical is a good way to check with your health care provider about your health and preventive screening. It is a chance to share any concerns and updates on your health and to receive a thorough exam.  Visit your dentist for a routine exam and preventive care every 6 months. Brush your teeth twice a day and floss once a day. Good oral hygiene prevents tooth decay and gum disease.  The frequency of eye exams is based on your age, health, family medical history, use of contact lenses, and other factors. Follow your health care provider's recommendations for frequency of eye exams.  Eat a healthy diet. Foods like vegetables, fruits, whole grains, low-fat dairy products, and lean protein foods contain the nutrients you need without too many calories. Decrease your intake of foods high in solid fats, added sugars, and salt. Eat the right amount of calories for you.Get information about a proper diet from your health care provider, if necessary.  Regular physical exercise is one of the most important things you can do for your health. Most adults should get at least 150 minutes of moderate-intensity exercise (any activity that increases your heart rate and causes you to sweat) each week. In addition, most adults need muscle-strengthening exercises on 2 or more days a week.  Maintain a healthy weight. The body mass index (BMI) is a screening tool to identify possible weight problems. It provides an estimate of body fat based on height and weight. Your health care provider can find your BMI and can help you achieve or maintain a healthy weight.For adults 20 years and older:  A BMI below 18.5 is considered underweight.  A BMI of 18.5 to 24.9 is normal.  A BMI of 25 to 29.9 is considered overweight.  A BMI of  30 and above is considered obese.  Maintain normal blood lipids and cholesterol levels by exercising and minimizing your intake of saturated fat. Eat a balanced diet with plenty of fruit and vegetables. Blood tests for lipids and cholesterol should begin at age 76 and be repeated every 5 years. If your lipid or cholesterol levels are high, you are over 50, or you are at high risk for heart disease, you may need your cholesterol levels checked more frequently.Ongoing high lipid and cholesterol levels should be treated with medicines if diet and exercise are not working.  If you smoke, find out from your health care provider how to quit. If you do not use tobacco, do not start.  Lung cancer screening is recommended for adults aged 22-80 years who are at high risk for developing lung cancer because of a history of smoking. A yearly low-dose CT scan of the lungs is recommended for people who have at least a 30-pack-year history of smoking and are a current smoker or have quit within the past 15 years. A pack year of smoking is smoking an average of 1 pack of cigarettes a day for 1 year (for example: 1 pack a day for 30 years or 2 packs a day for 15 years). Yearly screening should continue until the smoker has stopped smoking for at least 15 years. Yearly screening should be stopped for people who develop a health problem that would prevent them from having lung cancer treatment.  If you are pregnant, do not drink alcohol. If you are breastfeeding,  be very cautious about drinking alcohol. If you are not pregnant and choose to drink alcohol, do not have more than 1 drink per day. One drink is considered to be 12 ounces (355 mL) of beer, 5 ounces (148 mL) of wine, or 1.5 ounces (44 mL) of liquor.  Avoid use of street drugs. Do not share needles with anyone. Ask for help if you need support or instructions about stopping the use of drugs.  High blood pressure causes heart disease and increases the risk of  stroke. Your blood pressure should be checked at least every 1 to 2 years. Ongoing high blood pressure should be treated with medicines if weight loss and exercise do not work.  If you are 75-52 years old, ask your health care provider if you should take aspirin to prevent strokes.  Diabetes screening involves taking a blood sample to check your fasting blood sugar level. This should be done once every 3 years, after age 15, if you are within normal weight and without risk factors for diabetes. Testing should be considered at a younger age or be carried out more frequently if you are overweight and have at least 1 risk factor for diabetes.  Breast cancer screening is essential preventive care for women. You should practice "breast self-awareness." This means understanding the normal appearance and feel of your breasts and may include breast self-examination. Any changes detected, no matter how small, should be reported to a health care provider. Women in their 58s and 30s should have a clinical breast exam (CBE) by a health care provider as part of a regular health exam every 1 to 3 years. After age 16, women should have a CBE every year. Starting at age 53, women should consider having a mammogram (breast X-ray test) every year. Women who have a family history of breast cancer should talk to their health care provider about genetic screening. Women at a high risk of breast cancer should talk to their health care providers about having an MRI and a mammogram every year.  Breast cancer gene (BRCA)-related cancer risk assessment is recommended for women who have family members with BRCA-related cancers. BRCA-related cancers include breast, ovarian, tubal, and peritoneal cancers. Having family members with these cancers may be associated with an increased risk for harmful changes (mutations) in the breast cancer genes BRCA1 and BRCA2. Results of the assessment will determine the need for genetic counseling and  BRCA1 and BRCA2 testing.  Routine pelvic exams to screen for cancer are no longer recommended for nonpregnant women who are considered low risk for cancer of the pelvic organs (ovaries, uterus, and vagina) and who do not have symptoms. Ask your health care provider if a screening pelvic exam is right for you.  If you have had past treatment for cervical cancer or a condition that could lead to cancer, you need Pap tests and screening for cancer for at least 20 years after your treatment. If Pap tests have been discontinued, your risk factors (such as having a new sexual partner) need to be reassessed to determine if screening should be resumed. Some women have medical problems that increase the chance of getting cervical cancer. In these cases, your health care provider may recommend more frequent screening and Pap tests.  The HPV test is an additional test that may be used for cervical cancer screening. The HPV test looks for the virus that can cause the cell changes on the cervix. The cells collected during the Pap test can be  tested for HPV. The HPV test could be used to screen women aged 30 years and older, and should be used in women of any age who have unclear Pap test results. After the age of 30, women should have HPV testing at the same frequency as a Pap test.  Colorectal cancer can be detected and often prevented. Most routine colorectal cancer screening begins at the age of 50 years and continues through age 75 years. However, your health care provider may recommend screening at an earlier age if you have risk factors for colon cancer. On a yearly basis, your health care provider may provide home test kits to check for hidden blood in the stool. Use of a small camera at the end of a tube, to directly examine the colon (sigmoidoscopy or colonoscopy), can detect the earliest forms of colorectal cancer. Talk to your health care provider about this at age 50, when routine screening begins. Direct  exam of the colon should be repeated every 5-10 years through age 75 years, unless early forms of pre-cancerous polyps or small growths are found.  People who are at an increased risk for hepatitis B should be screened for this virus. You are considered at high risk for hepatitis B if:  You were born in a country where hepatitis B occurs often. Talk with your health care provider about which countries are considered high risk.  Your parents were born in a high-risk country and you have not received a shot to protect against hepatitis B (hepatitis B vaccine).  You have HIV or AIDS.  You use needles to inject street drugs.  You live with, or have sex with, someone who has hepatitis B.  You get hemodialysis treatment.  You take certain medicines for conditions like cancer, organ transplantation, and autoimmune conditions.  Hepatitis C blood testing is recommended for all people born from 1945 through 1965 and any individual with known risks for hepatitis C.  Practice safe sex. Use condoms and avoid high-risk sexual practices to reduce the spread of sexually transmitted infections (STIs). STIs include gonorrhea, chlamydia, syphilis, trichomonas, herpes, HPV, and human immunodeficiency virus (HIV). Herpes, HIV, and HPV are viral illnesses that have no cure. They can result in disability, cancer, and death.  You should be screened for sexually transmitted illnesses (STIs) including gonorrhea and chlamydia if:  You are sexually active and are younger than 24 years.  You are older than 24 years and your health care provider tells you that you are at risk for this type of infection.  Your sexual activity has changed since you were last screened and you are at an increased risk for chlamydia or gonorrhea. Ask your health care provider if you are at risk.  If you are at risk of being infected with HIV, it is recommended that you take a prescription medicine daily to prevent HIV infection. This is  called preexposure prophylaxis (PrEP). You are considered at risk if:  You are a heterosexual woman, are sexually active, and are at increased risk for HIV infection.  You take drugs by injection.  You are sexually active with a partner who has HIV.  Talk with your health care provider about whether you are at high risk of being infected with HIV. If you choose to begin PrEP, you should first be tested for HIV. You should then be tested every 3 months for as long as you are taking PrEP.  Osteoporosis is a disease in which the bones lose minerals and strength   with aging. This can result in serious bone fractures or breaks. The risk of osteoporosis can be identified using a bone density scan. Women ages 65 years and over and women at risk for fractures or osteoporosis should discuss screening with their health care providers. Ask your health care provider whether you should take a calcium supplement or vitamin D to reduce the rate of osteoporosis.  Menopause can be associated with physical symptoms and risks. Hormone replacement therapy is available to decrease symptoms and risks. You should talk to your health care provider about whether hormone replacement therapy is right for you.  Use sunscreen. Apply sunscreen liberally and repeatedly throughout the day. You should seek shade when your shadow is shorter than you. Protect yourself by wearing long sleeves, pants, a wide-brimmed hat, and sunglasses year round, whenever you are outdoors.  Once a month, do a whole body skin exam, using a mirror to look at the skin on your back. Tell your health care provider of new moles, moles that have irregular borders, moles that are larger than a pencil eraser, or moles that have changed in shape or color.  Stay current with required vaccines (immunizations).  Influenza vaccine. All adults should be immunized every year.  Tetanus, diphtheria, and acellular pertussis (Td, Tdap) vaccine. Pregnant women should  receive 1 dose of Tdap vaccine during each pregnancy. The dose should be obtained regardless of the length of time since the last dose. Immunization is preferred during the 27th-36th week of gestation. An adult who has not previously received Tdap or who does not know her vaccine status should receive 1 dose of Tdap. This initial dose should be followed by tetanus and diphtheria toxoids (Td) booster doses every 10 years. Adults with an unknown or incomplete history of completing a 3-dose immunization series with Td-containing vaccines should begin or complete a primary immunization series including a Tdap dose. Adults should receive a Td booster every 10 years.  Varicella vaccine. An adult without evidence of immunity to varicella should receive 2 doses or a second dose if she has previously received 1 dose. Pregnant females who do not have evidence of immunity should receive the first dose after pregnancy. This first dose should be obtained before leaving the health care facility. The second dose should be obtained 4-8 weeks after the first dose.  Human papillomavirus (HPV) vaccine. Females aged 13-26 years who have not received the vaccine previously should obtain the 3-dose series. The vaccine is not recommended for use in pregnant females. However, pregnancy testing is not needed before receiving a dose. If a female is found to be pregnant after receiving a dose, no treatment is needed. In that case, the remaining doses should be delayed until after the pregnancy. Immunization is recommended for any person with an immunocompromised condition through the age of 26 years if she did not get any or all doses earlier. During the 3-dose series, the second dose should be obtained 4-8 weeks after the first dose. The third dose should be obtained 24 weeks after the first dose and 16 weeks after the second dose.  Zoster vaccine. One dose is recommended for adults aged 60 years or older unless certain conditions are  present.  Measles, mumps, and rubella (MMR) vaccine. Adults born before 1957 generally are considered immune to measles and mumps. Adults born in 1957 or later should have 1 or more doses of MMR vaccine unless there is a contraindication to the vaccine or there is laboratory evidence of immunity to   each of the three diseases. A routine second dose of MMR vaccine should be obtained at least 28 days after the first dose for students attending postsecondary schools, health care workers, or international travelers. People who received inactivated measles vaccine or an unknown type of measles vaccine during 1963-1967 should receive 2 doses of MMR vaccine. People who received inactivated mumps vaccine or an unknown type of mumps vaccine before 1979 and are at high risk for mumps infection should consider immunization with 2 doses of MMR vaccine. For females of childbearing age, rubella immunity should be determined. If there is no evidence of immunity, females who are not pregnant should be vaccinated. If there is no evidence of immunity, females who are pregnant should delay immunization until after pregnancy. Unvaccinated health care workers born before 1957 who lack laboratory evidence of measles, mumps, or rubella immunity or laboratory confirmation of disease should consider measles and mumps immunization with 2 doses of MMR vaccine or rubella immunization with 1 dose of MMR vaccine.  Pneumococcal 13-valent conjugate (PCV13) vaccine. When indicated, a person who is uncertain of her immunization history and has no record of immunization should receive the PCV13 vaccine. An adult aged 19 years or older who has certain medical conditions and has not been previously immunized should receive 1 dose of PCV13 vaccine. This PCV13 should be followed with a dose of pneumococcal polysaccharide (PPSV23) vaccine. The PPSV23 vaccine dose should be obtained at least 8 weeks after the dose of PCV13 vaccine. An adult aged 19  years or older who has certain medical conditions and previously received 1 or more doses of PPSV23 vaccine should receive 1 dose of PCV13. The PCV13 vaccine dose should be obtained 1 or more years after the last PPSV23 vaccine dose.  Pneumococcal polysaccharide (PPSV23) vaccine. When PCV13 is also indicated, PCV13 should be obtained first. All adults aged 65 years and older should be immunized. An adult younger than age 65 years who has certain medical conditions should be immunized. Any person who resides in a nursing home or long-term care facility should be immunized. An adult smoker should be immunized. People with an immunocompromised condition and certain other conditions should receive both PCV13 and PPSV23 vaccines. People with human immunodeficiency virus (HIV) infection should be immunized as soon as possible after diagnosis. Immunization during chemotherapy or radiation therapy should be avoided. Routine use of PPSV23 vaccine is not recommended for American Indians, [place] Natives, or people younger than 65 years unless there are medical conditions that require PPSV23 vaccine. When indicated, people who have unknown immunization and have no record of immunization should receive PPSV23 vaccine. One-time revaccination 5 years after the first dose of PPSV23 is recommended for people aged 19-64 years who have chronic kidney failure, nephrotic syndrome, asplenia, or immunocompromised conditions. People who received 1-2 doses of PPSV23 before age 65 years should receive another dose of PPSV23 vaccine at age 65 years or later if at least 5 years have passed since the previous dose. Doses of PPSV23 are not needed for people immunized with PPSV23 at or after age 65 years.  Meningococcal vaccine. Adults with asplenia or persistent complement component deficiencies should receive 2 doses of quadrivalent meningococcal conjugate (MenACWY-D) vaccine. The doses should be obtained at least 2 months apart.  Microbiologists working with certain meningococcal bacteria, military recruits, people at risk during an outbreak, and people who travel to or live in countries with a high rate of meningitis should be immunized. A first-year college student up through age   21 years who is living in a residence hall should receive a dose if she did not receive a dose on or after her 16th birthday. Adults who have certain high-risk conditions should receive one or more doses of vaccine.  Hepatitis A vaccine. Adults who wish to be protected from this disease, have certain high-risk conditions, work with hepatitis A-infected animals, work in hepatitis A research labs, or travel to or work in countries with a high rate of hepatitis A should be immunized. Adults who were previously unvaccinated and who anticipate close contact with an international adoptee during the first 60 days after arrival in the [place] from a country with a high rate of hepatitis A should be immunized.  Hepatitis B vaccine. Adults who wish to be protected from this disease, have certain high-risk conditions, may be exposed to blood or other infectious body fluids, are household contacts or sex partners of hepatitis B positive people, are clients or workers in certain care facilities, or travel to or work in countries with a high rate of hepatitis B should be immunized.  Haemophilus influenzae type b (Hib) vaccine. A previously unvaccinated person with asplenia or sickle cell disease or having a scheduled splenectomy should receive 1 dose of Hib vaccine. Regardless of previous immunization, a recipient of a hematopoietic stem cell transplant should receive a 3-dose series 6-12 months after her successful transplant. Hib vaccine is not recommended for adults with HIV infection. Preventive Services / Frequency Ages 64 to 68 years  Blood pressure check.** / Every 1 to 2 years.  Lipid and cholesterol check.** / Every 5 years beginning at age  22.  Clinical breast exam.** / Every 3 years for women in their 88s and 53s.  BRCA-related cancer risk assessment.** / For women who have family members with a BRCA-related cancer (breast, ovarian, tubal, or peritoneal cancers).  Pap test.** / Every 2 years from ages 90 through 51. Every 3 years starting at age 21 through age 56 or 3 with a history of 3 consecutive normal Pap tests.  HPV screening.** / Every 3 years from ages 24 through ages 1 to 46 with a history of 3 consecutive normal Pap tests.  Hepatitis C blood test.** / For any individual with known risks for hepatitis C.  Skin self-exam. / Monthly.  Influenza vaccine. / Every year.  Tetanus, diphtheria, and acellular pertussis (Tdap, Td) vaccine.** / Consult your health care provider. Pregnant women should receive 1 dose of Tdap vaccine during each pregnancy. 1 dose of Td every 10 years.  Varicella vaccine.** / Consult your health care provider. Pregnant females who do not have evidence of immunity should receive the first dose after pregnancy.  HPV vaccine. / 3 doses over 6 months, if 72 and younger. The vaccine is not recommended for use in pregnant females. However, pregnancy testing is not needed before receiving a dose.  Measles, mumps, rubella (MMR) vaccine.** / You need at least 1 dose of MMR if you were born in 1957 or later. You may also need a 2nd dose. For females of childbearing age, rubella immunity should be determined. If there is no evidence of immunity, females who are not pregnant should be vaccinated. If there is no evidence of immunity, females who are pregnant should delay immunization until after pregnancy.  Pneumococcal 13-valent conjugate (PCV13) vaccine.** / Consult your health care provider.  Pneumococcal polysaccharide (PPSV23) vaccine.** / 1 to 2 doses if you smoke cigarettes or if you have certain conditions.  Meningococcal vaccine.** /  1 dose if you are age 19 to 21 years and a first-year college  student living in a residence hall, or have one of several medical conditions, you need to get vaccinated against meningococcal disease. You may also need additional booster doses.  Hepatitis A vaccine.** / Consult your health care provider.  Hepatitis B vaccine.** / Consult your health care provider.  Haemophilus influenzae type b (Hib) vaccine.** / Consult your health care provider. Ages 40 to 64 years  Blood pressure check.** / Every 1 to 2 years.  Lipid and cholesterol check.** / Every 5 years beginning at age 20 years.  Lung cancer screening. / Every year if you are aged 55-80 years and have a 30-pack-year history of smoking and currently smoke or have quit within the past 15 years. Yearly screening is stopped once you have quit smoking for at least 15 years or develop a health problem that would prevent you from having lung cancer treatment.  Clinical breast exam.** / Every year after age 40 years.  BRCA-related cancer risk assessment.** / For women who have family members with a BRCA-related cancer (breast, ovarian, tubal, or peritoneal cancers).  Mammogram.** / Every year beginning at age 40 years and continuing for as long as you are in good health. Consult with your health care provider.  Pap test.** / Every 3 years starting at age 30 years through age 65 or 70 years with a history of 3 consecutive normal Pap tests.  HPV screening.** / Every 3 years from ages 30 years through ages 65 to 70 years with a history of 3 consecutive normal Pap tests.  Fecal occult blood test (FOBT) of stool. / Every year beginning at age 50 years and continuing until age 75 years. You may not need to do this test if you get a colonoscopy every 10 years.  Flexible sigmoidoscopy or colonoscopy.** / Every 5 years for a flexible sigmoidoscopy or every 10 years for a colonoscopy beginning at age 50 years and continuing until age 75 years.  Hepatitis C blood test.** / For all people born from 1945 through  1965 and any individual with known risks for hepatitis C.  Skin self-exam. / Monthly.  Influenza vaccine. / Every year.  Tetanus, diphtheria, and acellular pertussis (Tdap/Td) vaccine.** / Consult your health care provider. Pregnant women should receive 1 dose of Tdap vaccine during each pregnancy. 1 dose of Td every 10 years.  Varicella vaccine.** / Consult your health care provider. Pregnant females who do not have evidence of immunity should receive the first dose after pregnancy.  Zoster vaccine.** / 1 dose for adults aged 60 years or older.  Measles, mumps, rubella (MMR) vaccine.** / You need at least 1 dose of MMR if you were born in 1957 or later. You may also need a 2nd dose. For females of childbearing age, rubella immunity should be determined. If there is no evidence of immunity, females who are not pregnant should be vaccinated. If there is no evidence of immunity, females who are pregnant should delay immunization until after pregnancy.  Pneumococcal 13-valent conjugate (PCV13) vaccine.** / Consult your health care provider.  Pneumococcal polysaccharide (PPSV23) vaccine.** / 1 to 2 doses if you smoke cigarettes or if you have certain conditions.  Meningococcal vaccine.** / Consult your health care provider.  Hepatitis A vaccine.** / Consult your health care provider.  Hepatitis B vaccine.** / Consult your health care provider.  Haemophilus influenzae type b (Hib) vaccine.** / Consult your health care provider. Ages 65   years and over  Blood pressure check.** / Every 1 to 2 years.  Lipid and cholesterol check.** / Every 5 years beginning at age 22 years.  Lung cancer screening. / Every year if you are aged 73-80 years and have a 30-pack-year history of smoking and currently smoke or have quit within the past 15 years. Yearly screening is stopped once you have quit smoking for at least 15 years or develop a health problem that would prevent you from having lung cancer  treatment.  Clinical breast exam.** / Every year after age 4 years.  BRCA-related cancer risk assessment.** / For women who have family members with a BRCA-related cancer (breast, ovarian, tubal, or peritoneal cancers).  Mammogram.** / Every year beginning at age 40 years and continuing for as long as you are in good health. Consult with your health care provider.  Pap test.** / Every 3 years starting at age 9 years through age 34 or 91 years with 3 consecutive normal Pap tests. Testing can be stopped between 65 and 70 years with 3 consecutive normal Pap tests and no abnormal Pap or HPV tests in the past 10 years.  HPV screening.** / Every 3 years from ages 57 years through ages 64 or 45 years with a history of 3 consecutive normal Pap tests. Testing can be stopped between 65 and 70 years with 3 consecutive normal Pap tests and no abnormal Pap or HPV tests in the past 10 years.  Fecal occult blood test (FOBT) of stool. / Every year beginning at age 15 years and continuing until age 17 years. You may not need to do this test if you get a colonoscopy every 10 years.  Flexible sigmoidoscopy or colonoscopy.** / Every 5 years for a flexible sigmoidoscopy or every 10 years for a colonoscopy beginning at age 86 years and continuing until age 71 years.  Hepatitis C blood test.** / For all people born from [birth year] through 1967 and any individual with known risks for hepatitis C.  Osteoporosis screening.** / A one-time screening for women ages 83 years and over and women at risk for fractures or osteoporosis.  Skin self-exam. / Monthly.  Influenza vaccine. / Every year.  Tetanus, diphtheria, and acellular pertussis (Tdap/Td) vaccine.** / 1 dose of Td every 10 years.  Varicella vaccine.** / Consult your health care provider.  Zoster vaccine.** / 1 dose for adults aged 61 years or older.  Pneumococcal 13-valent conjugate (PCV13) vaccine.** / Consult your health care provider.  Pneumococcal  polysaccharide (PPSV23) vaccine.** / 1 dose for all adults aged 28 years and older.  Meningococcal vaccine.** / Consult your health care provider.  Hepatitis A vaccine.** / Consult your health care provider.  Hepatitis B vaccine.** / Consult your health care provider.  Haemophilus influenzae type b (Hib) vaccine.** / Consult your health care provider. ** Family history and personal history of risk and conditions may change your health care provider's recommendations. Document Released: 08/16/2001 Document Revised: 11/04/2013 Document Reviewed: 11/15/2010 [facility] Patient Information 2015 [place], [place]. This information is not intended to replace advice given to you by your health care provider. Make sure you discuss any questions you have with your health care provider.

## 2015-02-17 ENCOUNTER — Encounter: Payer: Self-pay | Admitting: Internal Medicine

## 2015-02-17 NOTE — Assessment & Plan Note (Signed)

## 2015-02-18 ENCOUNTER — Telehealth: Payer: Self-pay | Admitting: Physician Assistant

## 2015-02-18 ENCOUNTER — Telehealth: Payer: Self-pay | Admitting: Internal Medicine

## 2015-02-18 DIAGNOSIS — A0472 Enterocolitis due to Clostridium difficile, not specified as recurrent: Secondary | ICD-10-CM

## 2015-02-18 NOTE — Telephone Encounter (Signed)
pts medication diphenoxylate-atropine (LOMOTIL) 2.5-0.025 MG per tablet [[id number]] needs pre authorization for insurance to pay

## 2015-02-18 NOTE — Telephone Encounter (Signed)
Spoke with patient's husband and she is still having diarrhea. Not as bad but still has it and sometimes cannot make it to bathroom. He states her PCP sent Lomotil in for her. He is wondering if she should stay on daily Vancomycin instead of starting the taper today. Please, advise.

## 2015-02-18 NOTE — Telephone Encounter (Signed)
Per patient's husband she is just now starting to decrease down on Vancomycin(after 12 PM today) He states she is not having any mucous or blood in stool. Stools are liquid to pudding like and occur urgently at night. She is taking Publishing copy. Per Cecille Rubin Hvozdovic, PA-C continue on QID vancomycin x 7 more days and make a referral to ID with Dr. Wilder Glade. Confirmed with ID that referral has been received. Patient's husband aware.

## 2015-02-18 NOTE — Telephone Encounter (Signed)
[name], She was started on a taper on Aug 9--is she not taking it? Is he taking florastor 250 mg 2 capsules bid as well?

## 2015-02-19 NOTE — Telephone Encounter (Signed)
Received incoming fax for pa. Paperwork was faxed 8/18

## 2015-02-20 ENCOUNTER — Ambulatory Visit (HOSPITAL_BASED_OUTPATIENT_CLINIC_OR_DEPARTMENT_OTHER): Payer: PPO | Admitting: Oncology

## 2015-02-20 VITALS — BP 83/47 | HR 71 | Temp 97.9°F | Resp 18 | Ht 65.0 in | Wt 114.4 lb

## 2015-02-20 DIAGNOSIS — Z853 Personal history of malignant neoplasm of breast: Secondary | ICD-10-CM | POA: Diagnosis not present

## 2015-02-20 DIAGNOSIS — Z85810 Personal history of malignant neoplasm of tongue: Secondary | ICD-10-CM

## 2015-02-20 DIAGNOSIS — Z8521 Personal history of malignant neoplasm of larynx: Secondary | ICD-10-CM

## 2015-02-20 DIAGNOSIS — C760 Malignant neoplasm of head, face and neck: Secondary | ICD-10-CM

## 2015-02-20 DIAGNOSIS — I4891 Unspecified atrial fibrillation: Secondary | ICD-10-CM | POA: Diagnosis not present

## 2015-02-20 NOTE — Progress Notes (Signed)
  [name]   Diagnosis: Head and neck cancer, breast cancer  INTERVAL HISTORY:   [name] was last seen at the [facility] in 2012. She has experienced multiple medical problems over the past few years including a diagnosis of larynx cancer requiring a laryngectomy. She has recurring C. difficile colitis, currently completing a course of oral vancomycin. She has been referred to infectious disease.  She recently developed an erythematous rash over the lower extremities and was diagnosed with vasculitis. She is being treated with Colchicine.  Objective:  Vital signs in last 24 hours:  Blood pressure 83/47, pulse 71, temperature 97.9 F (36.6 C), temperature source Oral, resp. rate 18, height 5\' 5"  (1.651 m), weight 114 lb 6.4 oz (51.891 kg), SpO2 97 %. repeat manual blood pressure 98/40    HEENT: Oropharynx without visible mass, post radiation and surgical changes of the neck, no mass Lymphatics: No cervical, supraclavicular, or axillary nodes Resp: Lungs clear bilaterally Cardio: Irregular GI: No hepatomegaly, no spinal Megace, nontender Vascular: 1+ pitting edema below the knee bilaterally  Skin: Erythematous rash at the lower legs Breasts: Status post right mastectomy. No evidence for chest wall tumor recurrence. Left breast without mass.      Medications: I have reviewed the patient's current medications.  Assessment/Plan:  1. Stage II right-sided breast cancer diagnosed in August 1999, status post adjuvant CMF chemotherapy followed by 5 years of Arimidex 2. 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 T1 N0 left-sided breast cancer, status post a left lumpectomy and sentinel lymph node biopsy, she completed adjuvant left breast radiation and completed 5 years of tamoxifen in March 2012 5.  Ulcerative colitis 6.  Recurring C. difficile colitis 7.  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 History of a CVA 9. Atrial fibrillation  Disposition:  [name] is in clinical remission from head and neck cancer, and breast cancer. She has multiple comorbid conditions. She continues follow-up at [facility] with Dr. Vicie Mutters for the head and neck cancer. It is difficult for her to return to the [facility] for appointments.  She is followed by Dr. Ronnald Ramp for internal medicine care.  We discharged her from the Oncology clinic. I am available to see her in the future as needed.  Betsy Coder, MD  02/20/2015  12:18 PM

## 2015-02-20 NOTE — Telephone Encounter (Signed)
Awaiting response from ins.

## 2015-02-23 NOTE — Telephone Encounter (Signed)
Approved through 07/04/2015. [facility] notified

## 2015-02-24 ENCOUNTER — Encounter: Payer: Self-pay | Admitting: *Deleted

## 2015-02-24 ENCOUNTER — Telehealth: Payer: Self-pay | Admitting: Physician Assistant

## 2015-02-24 DIAGNOSIS — R197 Diarrhea, unspecified: Secondary | ICD-10-CM

## 2015-02-24 NOTE — Telephone Encounter (Signed)
Spoke with patient's husband. He states she continues to have diarrhea even while taking Vancomycin QID. States it is worse at night. She was up twice last night and 3 times on Sunday night with diarrhea. She is down to 113 lbs. She is suppose to taper down on Vancomycin tomorrow. He will need to get it refilled and he is wondering if he should continue to give it to her since it is not helping the diarrhea. He has not heard from ID about an appointment yet.(I called and left a message requesting they call me today) Please, advise.

## 2015-02-24 NOTE — Telephone Encounter (Signed)
Spoke with Cecille Rubin Hvozdovic, PA-C and patient should stay on vancomycin. Obtain stool for c. Diff again.

## 2015-02-25 ENCOUNTER — Other Ambulatory Visit: Payer: PPO

## 2015-02-25 ENCOUNTER — Telehealth: Payer: Self-pay | Admitting: Internal Medicine

## 2015-02-25 DIAGNOSIS — R197 Diarrhea, unspecified: Secondary | ICD-10-CM

## 2015-02-25 NOTE — Telephone Encounter (Signed)
With ongoing severe diarrhea, would continue vanco 250 mg QID without tapering at this point until dificid is approved Awaiting ID input Given UC and high likelihood of flare worsening diarrhea I would begin prednisone 20 mg daily, assess response after 1 week.  Call if worsening, bleeding, fevers, chills Pt needs CBC, CMP

## 2015-02-25 NOTE — Telephone Encounter (Signed)
Papers faxed to [facility] for Dificid, waiting to hear from [facility]. Pts husband states he has 3 doses left of Vancomycin. Husband wants to know if he should refill the Vancomycin and should he taper or continue taking it every 6 hours. Please advise.

## 2015-02-26 ENCOUNTER — Inpatient Hospital Stay (HOSPITAL_COMMUNITY)
Admission: EM | Admit: 2015-02-26 | Discharge: 2015-02-28 | DRG: 392 | Disposition: A | Discharge status: Home Health | Payer: PPO | Attending: Internal Medicine | Admitting: Internal Medicine

## 2015-02-26 ENCOUNTER — Encounter (HOSPITAL_COMMUNITY): Payer: Self-pay

## 2015-02-26 DIAGNOSIS — E860 Dehydration: Secondary | ICD-10-CM | POA: Diagnosis present

## 2015-02-26 DIAGNOSIS — K580 Irritable bowel syndrome with diarrhea: Secondary | ICD-10-CM | POA: Diagnosis not present

## 2015-02-26 DIAGNOSIS — I739 Peripheral vascular disease, unspecified: Secondary | ICD-10-CM | POA: Diagnosis present

## 2015-02-26 DIAGNOSIS — Z803 Family history of malignant neoplasm of breast: Secondary | ICD-10-CM

## 2015-02-26 DIAGNOSIS — G40909 Epilepsy, unspecified, not intractable, without status epilepticus: Secondary | ICD-10-CM | POA: Diagnosis present

## 2015-02-26 DIAGNOSIS — Z8521 Personal history of malignant neoplasm of larynx: Secondary | ICD-10-CM

## 2015-02-26 DIAGNOSIS — D649 Anemia, unspecified: Secondary | ICD-10-CM | POA: Diagnosis not present

## 2015-02-26 DIAGNOSIS — E785 Hyperlipidemia, unspecified: Secondary | ICD-10-CM | POA: Diagnosis present

## 2015-02-26 DIAGNOSIS — M1990 Unspecified osteoarthritis, unspecified site: Secondary | ICD-10-CM | POA: Diagnosis present

## 2015-02-26 DIAGNOSIS — Z87891 Personal history of nicotine dependence: Secondary | ICD-10-CM | POA: Diagnosis not present

## 2015-02-26 DIAGNOSIS — I10 Essential (primary) hypertension: Secondary | ICD-10-CM | POA: Diagnosis present

## 2015-02-26 DIAGNOSIS — E039 Hypothyroidism, unspecified: Secondary | ICD-10-CM | POA: Diagnosis present

## 2015-02-26 DIAGNOSIS — K51919 Ulcerative colitis, unspecified with unspecified complications: Secondary | ICD-10-CM

## 2015-02-26 DIAGNOSIS — Z8673 Personal history of transient ischemic attack (TIA), and cerebral infarction without residual deficits: Secondary | ICD-10-CM

## 2015-02-26 DIAGNOSIS — D638 Anemia in other chronic diseases classified elsewhere: Secondary | ICD-10-CM | POA: Diagnosis present

## 2015-02-26 DIAGNOSIS — Z833 Family history of diabetes mellitus: Secondary | ICD-10-CM

## 2015-02-26 DIAGNOSIS — Z8249 Family history of ischemic heart disease and other diseases of the circulatory system: Secondary | ICD-10-CM | POA: Diagnosis not present

## 2015-02-26 DIAGNOSIS — Z853 Personal history of malignant neoplasm of breast: Secondary | ICD-10-CM | POA: Diagnosis not present

## 2015-02-26 DIAGNOSIS — Z800 Family history of malignant neoplasm of digestive organs: Secondary | ICD-10-CM

## 2015-02-26 DIAGNOSIS — A0472 Enterocolitis due to Clostridium difficile, not specified as recurrent: Secondary | ICD-10-CM | POA: Insufficient documentation

## 2015-02-26 DIAGNOSIS — R197 Diarrhea, unspecified: Secondary | ICD-10-CM | POA: Diagnosis present

## 2015-02-26 DIAGNOSIS — F0390 Unspecified dementia without behavioral disturbance: Secondary | ICD-10-CM | POA: Diagnosis present

## 2015-02-26 DIAGNOSIS — I482 Chronic atrial fibrillation: Secondary | ICD-10-CM | POA: Diagnosis present

## 2015-02-26 DIAGNOSIS — Z8719 Personal history of other diseases of the digestive system: Secondary | ICD-10-CM | POA: Diagnosis not present

## 2015-02-26 DIAGNOSIS — K51918 Ulcerative colitis, unspecified with other complication: Secondary | ICD-10-CM | POA: Diagnosis not present

## 2015-02-26 DIAGNOSIS — Z86010 Personal history of colonic polyps: Secondary | ICD-10-CM

## 2015-02-26 DIAGNOSIS — E871 Hypo-osmolality and hyponatremia: Secondary | ICD-10-CM | POA: Diagnosis present

## 2015-02-26 DIAGNOSIS — A047 Enterocolitis due to Clostridium difficile: Secondary | ICD-10-CM | POA: Diagnosis not present

## 2015-02-26 LAB — COMPREHENSIVE METABOLIC PANEL
ALK PHOS: 158 U/L — AB (ref 38–126)
ALT: 16 U/L (ref 14–54)
ANION GAP: 5 (ref 5–15)
AST: 22 U/L (ref 15–41)
Albumin: 2.4 g/dL — ABNORMAL LOW (ref 3.5–5.0)
BUN: 8 mg/dL (ref 6–20)
CALCIUM: 7.7 mg/dL — AB (ref 8.9–10.3)
CO2: 30 mmol/L (ref 22–32)
Chloride: 97 mmol/L — ABNORMAL LOW (ref 101–111)
Creatinine, Ser: 0.66 mg/dL (ref 0.44–1.00)
Glucose, Bld: 135 mg/dL — ABNORMAL HIGH (ref 65–99)
Potassium: 3.5 mmol/L (ref 3.5–5.1)
SODIUM: 132 mmol/L — AB (ref 135–145)
Total Bilirubin: 0.6 mg/dL (ref 0.3–1.2)
Total Protein: 5.9 g/dL — ABNORMAL LOW (ref 6.5–8.1)

## 2015-02-26 LAB — CBC WITH DIFFERENTIAL/PLATELET
BASOS PCT: 0 % (ref 0–1)
Basophils Absolute: 0 10*3/uL (ref 0.0–0.1)
EOS ABS: 0 10*3/uL (ref 0.0–0.7)
Eosinophils Relative: 0 % (ref 0–5)
HCT: 31 % — ABNORMAL LOW (ref 36.0–46.0)
HEMOGLOBIN: 10.1 g/dL — AB (ref 12.0–15.0)
Lymphocytes Relative: 10 % — ABNORMAL LOW (ref 12–46)
Lymphs Abs: 1.1 10*3/uL (ref 0.7–4.0)
MCH: 31.1 pg (ref 26.0–34.0)
MCHC: 32.6 g/dL (ref 30.0–36.0)
MCV: 95.4 fL (ref 78.0–100.0)
MONO ABS: 0.5 10*3/uL (ref 0.1–1.0)
Monocytes Relative: 5 % (ref 3–12)
NEUTROS ABS: 9.3 10*3/uL — AB (ref 1.7–7.7)
Neutrophils Relative %: 85 % — ABNORMAL HIGH (ref 43–77)
PLATELETS: 235 10*3/uL (ref 150–400)
RBC: 3.25 MIL/uL — ABNORMAL LOW (ref 3.87–5.11)
RDW: 18.1 % — ABNORMAL HIGH (ref 11.5–15.5)
WBC: 10.9 10*3/uL — ABNORMAL HIGH (ref 4.0–10.5)

## 2015-02-26 LAB — C DIFFICILE QUICK SCREEN W PCR REFLEX
C DIFFICILE (CDIFF) TOXIN: NEGATIVE — AB
C DIFFICLE (CDIFF) ANTIGEN: NEGATIVE — AB

## 2015-02-26 LAB — DIGOXIN LEVEL: Digoxin Level: 0.3 ng/mL — ABNORMAL LOW (ref 0.8–2.0)

## 2015-02-26 LAB — CLOSTRIDIUM DIFFICILE BY PCR: CDIFFPCR: NOT DETECTED

## 2015-02-26 MED ORDER — METHYLPREDNISOLONE SODIUM SUCC 40 MG IJ SOLR
40.0000 mg | Freq: Every day | INTRAMUSCULAR | Status: DC
  Administered 2015-02-26 – 2015-02-28 (×3): 40 mg via INTRAVENOUS
  Filled 2015-02-26 (×3): qty 1

## 2015-02-26 MED ORDER — LEVETIRACETAM 750 MG PO TABS
1500.0000 mg | ORAL_TABLET | Freq: Two times a day (BID) | ORAL | Status: DC
  Administered 2015-02-26 – 2015-02-28 (×4): 1500 mg via ORAL
  Filled 2015-02-26 (×5): qty 2

## 2015-02-26 MED ORDER — CETYLPYRIDINIUM CHLORIDE 0.05 % MT LIQD
7.0000 mL | Freq: Two times a day (BID) | OROMUCOSAL | Status: DC
  Administered 2015-02-27: 7 mL via OROMUCOSAL

## 2015-02-26 MED ORDER — HYDROCODONE-ACETAMINOPHEN 5-325 MG PO TABS: 1.0000 | ORAL_TABLET | ORAL | Status: DC | PRN

## 2015-02-26 MED ORDER — ENOXAPARIN SODIUM 40 MG/0.4ML ~~LOC~~ SOLN
40.0000 mg | SUBCUTANEOUS | Status: DC
  Administered 2015-02-26 – 2015-02-27 (×2): 40 mg via SUBCUTANEOUS
  Filled 2015-02-26 (×3): qty 0.4

## 2015-02-26 MED ORDER — DIGOXIN 125 MCG PO TABS
125.0000 ug | ORAL_TABLET | Freq: Every day | ORAL | Status: DC
  Administered 2015-02-26 – 2015-02-28 (×3): 125 ug via ORAL
  Filled 2015-02-26 (×3): qty 1

## 2015-02-26 MED ORDER — MESALAMINE 400 MG PO CPDR
1600.0000 mg | DELAYED_RELEASE_CAPSULE | Freq: Three times a day (TID) | ORAL | Status: DC
  Administered 2015-02-26 – 2015-02-28 (×5): 1600 mg via ORAL
  Filled 2015-02-26 (×7): qty 4

## 2015-02-26 MED ORDER — CHLORHEXIDINE GLUCONATE 0.12 % MT SOLN
15.0000 mL | Freq: Two times a day (BID) | OROMUCOSAL | Status: DC
  Administered 2015-02-26 – 2015-02-28 (×4): 15 mL via OROMUCOSAL
  Filled 2015-02-26 (×5): qty 15

## 2015-02-26 MED ORDER — ONDANSETRON HCL 4 MG PO TABS: 4.0000 mg | ORAL_TABLET | Freq: Four times a day (QID) | ORAL | Status: DC | PRN

## 2015-02-26 MED ORDER — SODIUM CHLORIDE 0.9 % IV SOLN
1000.0000 mL | INTRAVENOUS | Status: DC
  Administered 2015-02-26: 1000 mL via INTRAVENOUS

## 2015-02-26 MED ORDER — SODIUM CHLORIDE 0.9 % IV SOLN
INTRAVENOUS | Status: DC
  Administered 2015-02-26 – 2015-02-27 (×2): via INTRAVENOUS

## 2015-02-26 MED ORDER — ADULT MULTIVITAMIN W/MINERALS CH
1.0000 | ORAL_TABLET | Freq: Every day | ORAL | Status: DC
  Administered 2015-02-26 – 2015-02-28 (×3): 1 via ORAL
  Filled 2015-02-26 (×3): qty 1

## 2015-02-26 MED ORDER — SACCHAROMYCES BOULARDII 250 MG PO CAPS
500.0000 mg | ORAL_CAPSULE | Freq: Two times a day (BID) | ORAL | Status: DC
  Administered 2015-02-26 – 2015-02-28 (×4): 500 mg via ORAL
  Filled 2015-02-26 (×5): qty 2

## 2015-02-26 MED ORDER — ASPIRIN EC 81 MG PO TBEC
81.0000 mg | DELAYED_RELEASE_TABLET | Freq: Every day | ORAL | Status: DC
  Administered 2015-02-27 – 2015-02-28 (×2): 81 mg via ORAL
  Filled 2015-02-26 (×2): qty 1

## 2015-02-26 MED ORDER — CALCIUM CARBONATE-VITAMIN D 500-200 MG-UNIT PO TABS
ORAL_TABLET | Freq: Every day | ORAL | Status: DC
  Administered 2015-02-26 – 2015-02-28 (×3): 1 via ORAL
  Filled 2015-02-26 (×3): qty 1

## 2015-02-26 MED ORDER — LIDOCAINE VISCOUS 2 % MT SOLN
20.0000 mL | OROMUCOSAL | Status: DC | PRN
  Administered 2015-02-27 – 2015-02-28 (×2): 20 mL via OROMUCOSAL
  Filled 2015-02-26 (×2): qty 30
  Filled 2015-02-26: qty 20

## 2015-02-26 MED ORDER — VANCOMYCIN 50 MG/ML ORAL SOLUTION
250.0000 mg | Freq: Four times a day (QID) | ORAL | Status: DC
  Administered 2015-02-26 – 2015-02-28 (×7): 250 mg via ORAL
  Filled 2015-02-26 (×12): qty 5

## 2015-02-26 MED ORDER — ONDANSETRON HCL 4 MG/2ML IJ SOLN: 4.0000 mg | Freq: Four times a day (QID) | INTRAMUSCULAR | Status: DC | PRN

## 2015-02-26 MED ORDER — PRAVASTATIN SODIUM 40 MG PO TABS
40.0000 mg | ORAL_TABLET | Freq: Every evening | ORAL | Status: DC
  Administered 2015-02-26 – 2015-02-27 (×2): 40 mg via ORAL
  Filled 2015-02-26 (×3): qty 1

## 2015-02-26 MED ORDER — SODIUM CHLORIDE 0.9 % IV SOLN
1000.0000 mL | Freq: Once | INTRAVENOUS | Status: AC
  Administered 2015-02-26: 1000 mL via INTRAVENOUS

## 2015-02-26 MED ORDER — LEVOTHYROXINE SODIUM 50 MCG PO TABS
50.0000 ug | ORAL_TABLET | Freq: Every evening | ORAL | Status: DC
  Administered 2015-02-26 – 2015-02-27 (×2): 50 ug via ORAL
  Filled 2015-02-26 (×3): qty 1

## 2015-02-26 NOTE — Telephone Encounter (Signed)
error 

## 2015-02-26 NOTE — ED Notes (Signed)
Pt incont sm. Amt soft stool, unable to be collected.  Pt cleaned, brief applied and pt assisted to reposition for comfort.  Pt denies further needs/complaints at this time.  Family remains present at bedside.  NAD.

## 2015-02-26 NOTE — Telephone Encounter (Signed)
Spoke with husband and he is aware. He has prednisone and will go ahead and start this. States that his copay for the dificid would be 1565.00. Also states [name] had labs done recently (8/9, 8/15) and wants to know if she needs to have these repeated. Husband wants to know what Dr. Hilarie Fredrickson thinks they should do with the dificid. Please advise.

## 2015-02-26 NOTE — ED Provider Notes (Signed)
CSN: [id number]     Arrival date & time 02/26/15  1032 History   First MD Initiated Contact with Patient 02/26/15 1038     Chief Complaint  Patient presents with  . Diarrhea     (Consider location/radiation/quality/duration/timing/severity/associated sxs/prior Treatment) Patient is a 74 y.o. female presenting with diarrhea. The history is provided by the spouse. The history is limited by the condition of the patient.  Diarrhea    Patient is a 74 y.o. female with PMH significant for diverticulosis, ulcerative colitis, and c. difficile colitis (x4) who was treated a couple of weeks ago for c.diff with vancomycin and has not had any improvement in her sxs.  Husband denies fever, chills, CP, SOB, N/V, abdominal pain, dysuria, or hematuria.  Husband reports bloody stools; however, this has been ongoing prior to the c.diff infection and associates it with her chronic UC. She is closely followed by [facility] who has been following her for her UC and c.diff.  Husband reports explosive loose watery diarrhea and that she has become very weak.  Last night and this morning she has had difficulty ambulating.  She sustained a fall last night and landed on her right elbow and head.  She did not have a LOC, N/V, seizure, or fevers.  She is not on any blood thinners.   Past Medical History  Diagnosis Date  . Peripheral vascular disease   . Subdural hematoma 1990s    s/p fall in bathtub  . Mild dysplasia of cervix   . UTI (lower urinary tract infection)   . Urinary incontinence   . Cerebrovascular disease 2000  . Personal history of colonic polyps     adenomatous 1997 & tubular adenoma and hyplastic  2008  . Hyperlipidemia     takes Pravastatin daily  . H/O alcohol abuse   . Diverticulosis of colon (without mention of hemorrhage)   . Redundant colon   . C. difficile colitis   . Ulcerative colitis     takes [place] daily  . Permanent atrial fibrillation   . Seizures     takes Phenobarbital and  Keppra daily;last seizure was April 1,2014  . Stroke 2000    off balance on right side a little and peripheral vision loss on right side  . Arthritis   . Joint pain   . Joint swelling   . History of UTI 10-2012  . Hypothyroidism     takes Levothyroxine daily  . Squamous cell carcinoma of mouth   . Adenocarcinoma, breast     bilateral  . Anxiety   . Dementia   . Cancer of larynx   . Hypertension   . Gait disorder   . Oropharyngeal cancer   . Pneumonia 2/16  . Cellulitis    Past Surgical History  Procedure Laterality Date  . Subdural hematoma evacuation via craniotomy    . Oral surgery for squamous cell carcinoma of the mouth      x 3  . Multiple tooth extractions      due to oral cancer  . Cataract extraction, bilateral    . Breast lumpectomy      left breast with radiation therapy  . Carotid endarterectomy      left  . Mastectomy      Right, history with nodule dissection  . Orif hip fracture  Sept '12    Right hip: screw and plate repair.  . Larynx surgery  08/2010    [facility]  . Tubal ligation    .  Colonoscopy    . Orif humerus fracture Right 10/30/2012    Procedure: OPEN REDUCTION INTERNAL FIXATION (ORIF) HUMERAL SHAFT FRACTURE;  Surgeon: Hessie Dibble, MD;  Location: [facility];  Service: Orthopedics;  Laterality: Right;  BIG C-ARM, SHOULDER POSITION  . Hammer toe surgery Right 03/2013    pins and screws  . Flexible sigmoidoscopy N/A 06/21/2013    Procedure: FLEXIBLE SIGMOIDOSCOPY;  Surgeon: Jerene Bears, MD;  Location: [facility];  Service: Gastroenterology;  Laterality: N/A;  . Hardware removal Right 11/14/2013    Procedure: RIGHT SHOUDER HARDWARE REMOVAL;  Surgeon: Nita Sells, MD;  Location: [facility];  Service: Orthopedics;  Laterality: Right;  . Incision and drainage Right 11/14/2013    Procedure: INCISION AND DRAINAGE;  Surgeon: Nita Sells, MD;  Location: [facility];  Service: Orthopedics;  Laterality: Right;  . Shoulder surgery  10/2013    Family History  Problem Relation Age of Onset  . Coronary artery disease Mother   . Heart disease Mother   . Diabetes Sister   . Breast cancer Maternal Aunt   . Breast cancer Sister   . Colon cancer      nephew (sister's son)   Social History  Substance Use Topics  . Smoking status: Former Smoker -- 66 years    Quit date: 10/26/2004  . Smokeless tobacco: Never Used     Comment: quit in 2007  . Alcohol Use: 0.0 oz/week    0 Standard drinks or equivalent per week     Comment: normally 1 glass wine after dinner - occasional   OB History    No data available     Review of Systems  All other systems negative unless otherwise stated in HPI     Allergies  Xarelto and Diltiazem  Home Medications   Prior to Admission medications   Medication Sig Start Date End Date Taking? Authorizing Provider  aspirin 81 MG tablet Take 81 mg by mouth daily.   Yes Historical Provider, MD  Calcium Carbonate-Vitamin D (CALCIUM 500 + D PO) Take 1 tablet by mouth daily.   Yes Historical Provider, MD  colchicine 0.6 MG tablet Take 1 tablet (0.6 mg total) by mouth 2 (two) times daily. 02/16/15  Yes Janith Lima, MD  digoxin (LANOXIN) 0.125 MG tablet Take 1 tablet (125 mcg total) by mouth daily. 01/22/15  Yes Janith Lima, MD  diphenoxylate-atropine (LOMOTIL) 2.5-0.025 MG per tablet Take 1 tablet by mouth 4 (four) times daily as needed for diarrhea or loose stools. 02/16/15  Yes Janith Lima, MD  furosemide (LASIX) 20 MG tablet Take 1 tablet (20 mg total) by mouth daily. 10/29/14  Yes Janith Lima, MD  levETIRAcetam (KEPPRA) 750 MG tablet Take 2 tablets (1,500 mg total) by mouth 2 (two) times daily. 10/22/14  Yes Kathrynn Ducking, MD  levothyroxine (SYNTHROID, LEVOTHROID) 50 MCG tablet Take 1 tablet (50 mcg total) by mouth every evening. 06/17/14  Yes Janith Lima, MD  lidocaine (XYLOCAINE) 2 % solution Take 20 mLs by mouth every 3 (three) hours as needed for mouth pain. Oral pain   Yes  Historical Provider, MD  Mesalamine (ASACOL HD) 800 MG TBEC Take 2 tablets (1,600 mg total) by mouth 3 (three) times daily. NEEDS OFFICE VISIT FOR FURTHER REFILLS 06/13/14  Yes Jerene Bears, MD  Multiple Vitamin (MULTIVITAMIN WITH MINERALS) TABS Take 1 tablet by mouth daily.   Yes Historical Provider, MD  PHENobarbital (LUMINAL) 97.2 MG tablet TAKE 1 TABLET  BY MOUTH ONCE EVERY MORNING Patient taking differently: TAKE 97.2 MG BY MOUTH EVERY MORNING 10/01/14  Yes Kathrynn Ducking, MD  pravastatin (PRAVACHOL) 40 MG tablet Take 1 tablet (40 mg total) by mouth every evening. 01/13/15  Yes Janith Lima, MD  saccharomyces boulardii (FLORASTOR) 250 MG capsule Take 2 capsules (500 mg total) by mouth 2 (two) times daily. 02/10/15  Yes Lori P Hvozdovic, PA-C  vancomycin (VANCOCIN) 50 mg/mL oral solution 2.5 ml (0.5 tablespoon) qid x 7 days then bid x 7 days then qd x 7 days then qod x 7 days then every 3rd day for 14 days. 02/11/15  Yes Lori P Hvozdovic, PA-C   BP 106/49 mmHg  Pulse 78  Temp(Src) 97.8 F (36.6 C) (Oral)  Resp 20  SpO2 92%   Physical Exam  Constitutional: She is oriented to person, place, and time.  Patient is a frail appearing 74 year old female in no acute distress   HENT:  Head: Normocephalic and atraumatic.  1.5 cm abrasion on scalp with no active bleeding, erythema, or signs of infection.  Dry mucous membranes.  Cardiovascular: Normal rate, regular rhythm and normal heart sounds.   Pulmonary/Chest: Effort normal and breath sounds normal. No respiratory distress. She has no rales.  Abdominal: Soft. Bowel sounds are normal. She exhibits no distension. There is no tenderness. There is no rebound and no guarding.  Musculoskeletal:  Full range of motion of the right elbow  Neurological: She is alert and oriented to person, place, and time.  Skin: Skin is warm and dry.  0.5 cm superficial laceration not requiring repair above right posterior elbow.  No active bleeding, erythema, or  signs of infection   Psychiatric: She has a normal mood and affect. Her behavior is normal.    ED Course  Procedures (including critical care time) Labs Review Labs Reviewed  CBC WITH DIFFERENTIAL/PLATELET - Abnormal; Notable for the following:    WBC 10.9 (*)    RBC 3.25 (*)    Hemoglobin 10.1 (*)    HCT 31.0 (*)    RDW 18.1 (*)    Neutrophils Relative % 85 (*)    Lymphocytes Relative 10 (*)    Neutro Abs 9.3 (*)    All other components within normal limits  COMPREHENSIVE METABOLIC PANEL - Abnormal; Notable for the following:    Sodium 132 (*)    Chloride 97 (*)    Glucose, Bld 135 (*)    Calcium 7.7 (*)    Total Protein 5.9 (*)    Albumin 2.4 (*)    Alkaline Phosphatase 158 (*)    All other components within normal limits  DIGOXIN LEVEL - Abnormal; Notable for the following:    Digoxin Level 0.3 (*)    All other components within normal limits  C DIFFICILE QUICK SCREEN W PCR REFLEX    Imaging Review No results found. I have personally reviewed and evaluated these images and lab results as part of my medical decision-making.   EKG Interpretation None      MDM   Final diagnoses:  Diarrhea  Dehydration  Anemia, unspecified anemia type   Patient is a 74 y.o. female with PMH significant for diverticulosis, ulcerative colitis, and c. difficile colitis (x4) who was treated a couple of weeks ago for c.diff with vancomycin and has not had any improvement in her sxs.  Husband denies fever, chills, CP, SOB, N/V, abdominal pain, dysuria, or hematuria.  Husband reports bloody stools; however, this has been  ongoing prior to the c.diff infection and associates it with her chronic UC. She is closely followed by [facility] who has been following her for her UC and c.diff.  Husband reports explosive loose watery diarrhea and that she has become very weak.  Last night and this morning she has had difficulty ambulating.  She sustained a fall last night and landed on her right elbow  and head.  She did not have a LOC, N/V, seizure, or fevers.  She is not on any blood thinners.  Patient is hypotensive 106/52 and slightly tachycardic. On exam, she has a 1.5 cm abrasion on scalp, had no LOC, and is on no anticoagulation medications.  There is a 0.5 cm superficial laceration not actively bleeding and full range of motion of the right elbow.  CBC, CMP, Digoxin level, c.diff PCR pending and IVF started. Albumin 2.4 most likely secondary to malnutrition and dehydration from chronic diarrhea and UC.  Hemoglobin 10.9 today and has steadily decreased.  14.3 five months ago, 12.1 one month ago, 11.9 two weeks ago, and 10.9 today.  BUN 8/Cr 0.66.   Low suspicion for head trauma.  Low suspicion for right elbow fracture.  No evidence of acute renal failure. Most likely dehydration secondary to chronic diarrhea with c.diff etiology and possible UC flare.  Decreasing hemoglobin most likely secondary to GI blood loss via diarrhea and hx of bloody stools.  Patient will be admitted for chronic diarrhea and associated weakness and decreasing hemoglobin.  Case discussed with [facility] and patient will be admitted to [facility] floor.   Case has been discussed with and seen by Dr. Venora Maples who agrees with the above plan for admission. Gloriann Loan, PA 02/26/15 [place], MD 02/26/15 [phone]

## 2015-02-26 NOTE — H&P (Signed)
[facility] History and Physical  [name] WFU:[id number] DOB: January 19, 1941 DOA: 02/26/2015  Referring physician: ED physician, Dr. Venora Maples  PCP: Scarlette Calico, MD   Chief Complaint:   HPI:  74 y.o. female with known history of UC, laryngeal cancer s/p laryngectomy, last seen by GI in February 2016 for colitis flare up (C. Diff ruled out at that time and pt treated with Prednisone), recently diagnosed with leukocytoclastic vasculitis, last C. Diff on 01/27/2015 and was started on oral vancomycin 125 mg 4 x day (to complete 14 days therapy, still on it), now presented to [facility] ED for evaluation of persistent watery diarrhea and generalized weakness, poor oral intake. She denies abd or urinary concerns, no fevers, chills, no chest pain or shortness of breath. Of note, C. Diff yesterday was negative.   In ED, pt noted to be hemodynamically stable, VSS, blood worn notable for WBC 10.9, Na 132, otherwise unremarkable. TRH asked to admit for further evaluation, GI team consulted for further assistance.   Assessment and Plan: Active Problems: Persistent diarrhea, watery, acute on chronic  - unclear etiology, C. Diff negative yesterday so no need to recollect the sample  - ? UC flare - per GI team, start on Solumedrol 40 mg IV for now - continue oral vancomycin and florastor  - continue IVF that were started in ED Leukocytosis - mild,likely related to acute illness, was also on Prednisone in recent past - monitor, CBC in AM Anemia of chronic disease, UC - no signs of active bleeding - no indication for transfusion at this time Acute hyponatremia - likely pre renal in etiology - provide IVF - repeat BMP in AM Hx of seizures - continue Keppra  Hypothyroidism - continue synthroid  Hx of laryngeal cancer  DVT prophylaxis - Lovenox SQ  Radiological Exams on Admission: No results found.   Code Status: Full Family Communication: Pt at bedside Disposition Plan: Admit for further  evaluation    [facility] [phone]   Review of Systems:  Constitutional:  Negative for diaphoresis.  HENT: Negative for hearing loss, ear pain, nosebleeds, congestion, sore throat, neck pain, tinnitus and ear discharge.   Eyes: Negative for blurred vision, double vision, photophobia, pain, discharge and redness.  Respiratory: Negative for wheezing and stridor.   Cardiovascular: Negative for chest pain, palpitations, orthopnea, claudication and leg swelling.  Gastrointestinal: Per HPI  Genitourinary: Negative for dysuria, urgency, frequency, hematuria and flank pain.  Musculoskeletal: Negative for myalgias, back pain, joint pain.  Skin: Negative for itching and rash.  Neurological: Negative for tingling, tremors, sensory change, focal weakness, loss of consciousness and headaches.  Endo/Heme/Allergies: Negative for environmental allergies and polydipsia. Does not bruise/bleed easily.  Psychiatric/Behavioral: Negative for suicidal ideas. The patient is not nervous/anxious.      Past Medical History  Diagnosis Date  . Peripheral vascular disease   . Subdural hematoma 1990s    s/p fall in bathtub  . Mild dysplasia of cervix   . UTI (lower urinary tract infection)   . Urinary incontinence   . Cerebrovascular disease 2000  . Personal history of colonic polyps     adenomatous 1997 & tubular adenoma and hyplastic  2008  . Hyperlipidemia     takes Pravastatin daily  . H/O alcohol abuse   . Diverticulosis of colon (without mention of hemorrhage)   . Redundant colon   . C. difficile colitis   . Ulcerative colitis     takes [place] daily  . Permanent atrial fibrillation   .  Seizures     takes Phenobarbital and Keppra daily;last seizure was April 1,2014  . Stroke 2000    off balance on right side a little and peripheral vision loss on right side  . Arthritis   . Joint pain   . Joint swelling   . History of UTI 10-2012  . Hypothyroidism     takes Levothyroxine daily  . Squamous  cell carcinoma of mouth   . Adenocarcinoma, breast     bilateral  . Anxiety   . Dementia   . Cancer of larynx   . Hypertension   . Gait disorder   . Oropharyngeal cancer   . Pneumonia 2/16  . Cellulitis     Past Surgical History  Procedure Laterality Date  . Subdural hematoma evacuation via craniotomy    . Oral surgery for squamous cell carcinoma of the mouth      x 3  . Multiple tooth extractions      due to oral cancer  . Cataract extraction, bilateral    . Breast lumpectomy      left breast with radiation therapy  . Carotid endarterectomy      left  . Mastectomy      Right, history with nodule dissection  . Orif hip fracture  Sept '12    Right hip: screw and plate repair.  . Larynx surgery  08/2010    [facility] x [facility]  . Tubal ligation    . Colonoscopy    . Orif humerus fracture Right 10/30/2012    Procedure: OPEN REDUCTION INTERNAL FIXATION (ORIF) HUMERAL SHAFT FRACTURE;  Surgeon: Hessie Dibble, MD;  Location: [facility];  Service: Orthopedics;  Laterality: Right;  BIG C-ARM, SHOULDER POSITION  . Hammer toe surgery Right 03/2013    pins and screws  . Flexible sigmoidoscopy N/A 06/21/2013    Procedure: FLEXIBLE SIGMOIDOSCOPY;  Surgeon: Jerene Bears, MD;  Location: [facility];  Service: Gastroenterology;  Laterality: N/A;  . Hardware removal Right 11/14/2013    Procedure: RIGHT SHOUDER HARDWARE REMOVAL;  Surgeon: Nita Sells, MD;  Location: [place];  Service: Orthopedics;  Laterality: Right;  . Incision and drainage Right 11/14/2013    Procedure: INCISION AND DRAINAGE;  Surgeon: Nita Sells, MD;  Location: [place];  Service: Orthopedics;  Laterality: Right;  . Shoulder surgery  10/2013    Social History:  reports that she quit smoking about 10 years ago. She has never used smokeless tobacco. She reports that she drinks alcohol. She reports that she does not use illicit drugs.  Allergies  Allergen Reactions  . Xarelto [Rivaroxaban]     Suffered a severe  bleed  . Diltiazem Other (See Comments)    edema    Family History  Problem Relation Age of Onset  . Coronary artery disease Mother   . Heart disease Mother   . Diabetes Sister   . Breast cancer Maternal Aunt   . Breast cancer Sister   . Colon cancer      nephew (sister's son)    Medication Sig  aspirin 81 MG tablet Take 81 mg by mouth daily.  Calcium Carbonate-Vitamin D (CALCIUM 500 + D PO) Take 1 tablet by mouth daily.  colchicine 0.6 MG tablet Take 1 tablet (0.6 mg total) by mouth 2 (two) times daily.  digoxin (LANOXIN) 0.125 MG tablet Take 1 tablet (125 mcg total) by mouth daily.  diphenoxylate-atropine (LOMOTIL) 2.5-0.025 MG per tablet Take 1 tablet by mouth 4 (four) times daily as needed  for diarrhea or loose stools.  furosemide (LASIX) 20 MG tablet Take 1 tablet (20 mg total) by mouth daily.  levETIRAcetam (KEPPRA) 750 MG tablet Take 2 tablets (1,500 mg total) by mouth 2 (two) times daily.  levothyroxine (SYNTHROID, LEVOTHROID) 50 MCG tablet Take 1 tablet (50 mcg total) by mouth every evening.  lidocaine (XYLOCAINE) 2 % solution Take 20 mLs by mouth every 3 (three) hours as needed for mouth pain. Oral pain  Mesalamine (ASACOL HD) 800 MG TBEC Take 2 tablets (1,600 mg total) by mouth 3 (three) times daily. NEEDS OFFICE VISIT FOR FURTHER REFILLS  PHENobarbital (LUMINAL) 97.2 MG tablet TAKE 1 TABLET BY MOUTH ONCE EVERY MORNING Patient taking differently: TAKE 97.2 MG BY MOUTH EVERY MORNING  pravastatin (PRAVACHOL) 40 MG tablet Take 1 tablet (40 mg total) by mouth every evening.  saccharomyces boulardii (FLORASTOR) 250 MG capsule Take 2 capsules (500 mg total) by mouth 2 (two) times daily.  vancomycin (VANCOCIN) 50 mg/mL oral solution 2.5 ml (0.5 tablespoon) qid x 7 days then bid x 7 days then qd x 7 days then qod x 7 days then every 3rd day for 14 days.    Physical Exam: Filed Vitals:   02/26/15 1044 02/26/15 1354 02/26/15 1400 02/26/15 1500  BP: 106/52 100/54 106/49 103/52   Pulse: 104 81 78 79  Temp: 97.8 F (36.6 C)     TempSrc: Oral     Resp: 20     SpO2: 92%       Physical Exam  Constitutional: Appears well-developed and well-nourished. No distress.  HENT: Normocephalic. External right and left ear normal.  Eyes: Conjunctivae and EOM are normal. PERRLA, no scleral icterus.  Neck: Normal ROM. Neck supple. No JVD. No tracheal deviation. No thyromegaly.  CVS: RRR, S1/S2 +, no gallops, no carotid bruit.  Pulmonary: Effort and breath sounds normal, no stridor, rhonchi, wheezes, rales.  Abdominal: Soft. BS +,  no distension, tenderness mild in epigastric area, no rebound or guarding.  Musculoskeletal: Normal range of motion. No edema and no tenderness.  Lymphadenopathy: No lymphadenopathy noted, cervical, inguinal. Neuro: Alert. Normal reflexes, muscle tone coordination. No cranial nerve deficit. Skin: Skin is warm and dry. No rash noted. Not diaphoretic. No erythema. No pallor.  Psychiatric: Normal mood and affect.   Labs on Admission:  Basic Metabolic Panel:  Recent Labs Lab 02/26/15 1142  NA 132*  K 3.5  CL 97*  CO2 30  GLUCOSE 135*  BUN 8  CREATININE 0.66  CALCIUM 7.7*   Liver Function Tests:  Recent Labs Lab 02/26/15 1142  AST 22  ALT 16  ALKPHOS 158*  BILITOT 0.6  PROT 5.9*  ALBUMIN 2.4*   CBC:  Recent Labs Lab 02/26/15 1142  WBC 10.9*  NEUTROABS 9.3*  HGB 10.1*  HCT 31.0*  MCV 95.4  PLT 235    EKG: pending    If 7PM-7AM, please contact night-coverage [email or web address] Password [facility] 02/26/2015, 3:57 PM

## 2015-02-26 NOTE — ED Notes (Signed)
Initial Contact - pt awake, alert, resting with eyes closed.  Family at bedside.  +diarrhea, chronic, receiving abx for c.diff.  Per husband, pt with multiple falls of late, most recently last night.  Husband reports +strike to back of head, denies LOC.  Multiple scattered bruises noted to body, scabbed area to R elbow.  Skin otherwise PWD.  MAEI, +csm/+pulses.  Abd s/nt/nd.  RR even/un-lab.  NAD.

## 2015-02-26 NOTE — Progress Notes (Signed)
Spoke with the GI MD who stated "Stool for Cdiff on 02/25/15 was negative." Paged Dr. Doyle Askew and notified her of the negative result per the GI MD. Additionally, stated to Dr. Doyle Askew that the patient just had a loose stool that was light brow, watery, and had no odor. Per MD's instruction I have taken the patient off precuations until otherwise notified.  Madelin Rear RN, BSN

## 2015-02-26 NOTE — ED Notes (Signed)
Nurse drawing labs. 

## 2015-02-26 NOTE — Consult Note (Signed)
Referring Provider: No ref. provider found Primary Care Physician:  Scarlette Calico, MD Primary Gastroenterologist:  Dr. Hilarie Fredrickson  Reason for Consultation:  Diarrhea; UC; Cdiff  HPI: [name] is a 74 y.o. female who is known to Dr. Hilarie Fredrickson with a past medical history of ulcerative colitis, history of C. difficile colitis, laryngeal cancer status post laryngectomy, hip and shoulder fracture complicated by right shoulder hardware infection. She was seen in February 2016 with signs of an active colitis flare. C. difficile was ruled out and she was started on a prednisone taper with resolution of her symptoms. She was recently evaluated by Dr. 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 This raised the question as to whether this may be indicative of a flare of her UC. Patient was having diarrhea and C. difficile on 01/27/2015 was detected (this was about her fourth bout, third recurrence of this).  She was started on vancomycin 125 mg 4 times a day for 14 days.  Since then her vancomycin has been continued for ongoing diarrhea and plan was to taper it, however, she still remains on 4 times daily dosing.  Was trying to get Dificid, however, it was very expensive.  Also has an appt with ID, Dr. Baxter Flattery, in mid-September.   She was brought to the hospital by her husband today for ongoing diarrhea, weakness, having a lot of falls at home.  Stool for Cdiff yesterday was negative.  There is an order entered for another one to be collected here as well.  Patient's husband says that the stool still smells like the Cdiff.  Is only on mesalamine 4.8 grams daily for her UC.  Has been on prednisone a couple of times in the past as mentioned above.  Has also been taking Florastor 500 mg BID and lomotil 4 times a day as well.  Past Medical History  Diagnosis Date  . Peripheral vascular disease   . Subdural hematoma 1990s    s/p fall in bathtub  . Mild dysplasia of cervix   . UTI  (lower urinary tract infection)   . Urinary incontinence   . Cerebrovascular disease 2000  . Personal history of colonic polyps     adenomatous 1997 & tubular adenoma and hyplastic  2008  . Hyperlipidemia     takes Pravastatin daily  . H/O alcohol abuse   . Diverticulosis of colon (without mention of hemorrhage)   . Redundant colon   . C. difficile colitis   . Ulcerative colitis     takes [place] daily  . Permanent atrial fibrillation   . Seizures     takes Phenobarbital and Keppra daily;last seizure was April 1,2014  . Stroke 2000    off balance on right side a little and peripheral vision loss on right side  . Arthritis   . Joint pain   . Joint swelling   . History of UTI 10-2012  . Hypothyroidism     takes Levothyroxine daily  . Squamous cell carcinoma of mouth   . Adenocarcinoma, breast     bilateral  . Anxiety   . Dementia   . Cancer of larynx   . Hypertension   . Gait disorder   . Oropharyngeal cancer   . Pneumonia 2/16  . Cellulitis     Past Surgical History  Procedure Laterality Date  . Subdural hematoma evacuation via craniotomy    . Oral surgery for squamous cell carcinoma of the mouth  x 3  . Multiple tooth extractions      due to oral cancer  . Cataract extraction, bilateral    . Breast lumpectomy      left breast with radiation therapy  . Carotid endarterectomy      left  . Mastectomy      Right, history with nodule dissection  . Orif hip fracture  Sept '12    Right hip: screw and plate repair.  . Larynx surgery  08/2010    [facility]  . Tubal ligation    . Colonoscopy    . Orif humerus fracture Right 10/30/2012    Procedure: OPEN REDUCTION INTERNAL FIXATION (ORIF) HUMERAL SHAFT FRACTURE;  Surgeon: Hessie Dibble, MD;  Location: [place];  Service: Orthopedics;  Laterality: Right;  BIG C-ARM, SHOULDER POSITION  . Hammer toe surgery Right 03/2013    pins and screws  . Flexible sigmoidoscopy N/A 06/21/2013    Procedure: FLEXIBLE SIGMOIDOSCOPY;   Surgeon: Jerene Bears, MD;  Location: [facility];  Service: Gastroenterology;  Laterality: N/A;  . Hardware removal Right 11/14/2013    Procedure: RIGHT SHOUDER HARDWARE REMOVAL;  Surgeon: Nita Sells, MD;  Location: [place];  Service: Orthopedics;  Laterality: Right;  . Incision and drainage Right 11/14/2013    Procedure: INCISION AND DRAINAGE;  Surgeon: Nita Sells, MD;  Location: [place];  Service: Orthopedics;  Laterality: Right;  . Shoulder surgery  10/2013    Prior to Admission medications   Medication Sig Start Date End Date Taking? Authorizing Provider  aspirin 81 MG tablet Take 81 mg by mouth daily.   Yes Historical Provider, MD  Calcium Carbonate-Vitamin D (CALCIUM 500 + D PO) Take 1 tablet by mouth daily.   Yes Historical Provider, MD  colchicine 0.6 MG tablet Take 1 tablet (0.6 mg total) by mouth 2 (two) times daily. 02/16/15  Yes Janith Lima, MD  digoxin (LANOXIN) 0.125 MG tablet Take 1 tablet (125 mcg total) by mouth daily. 01/22/15  Yes Janith Lima, MD  diphenoxylate-atropine (LOMOTIL) 2.5-0.025 MG per tablet Take 1 tablet by mouth 4 (four) times daily as needed for diarrhea or loose stools. 02/16/15  Yes Janith Lima, MD  furosemide (LASIX) 20 MG tablet Take 1 tablet (20 mg total) by mouth daily. 10/29/14  Yes Janith Lima, MD  levETIRAcetam (KEPPRA) 750 MG tablet Take 2 tablets (1,500 mg total) by mouth 2 (two) times daily. 10/22/14  Yes Kathrynn Ducking, MD  levothyroxine (SYNTHROID, LEVOTHROID) 50 MCG tablet Take 1 tablet (50 mcg total) by mouth every evening. 06/17/14  Yes Janith Lima, MD  lidocaine (XYLOCAINE) 2 % solution Take 20 mLs by mouth every 3 (three) hours as needed for mouth pain. Oral pain   Yes Historical Provider, MD  Mesalamine (ASACOL HD) 800 MG TBEC Take 2 tablets (1,600 mg total) by mouth 3 (three) times daily. NEEDS OFFICE VISIT FOR FURTHER REFILLS 06/13/14  Yes Jerene Bears, MD  Multiple Vitamin (MULTIVITAMIN WITH MINERALS) TABS  Take 1 tablet by mouth daily.   Yes Historical Provider, MD  PHENobarbital (LUMINAL) 97.2 MG tablet TAKE 1 TABLET BY MOUTH ONCE EVERY MORNING Patient taking differently: TAKE 97.2 MG BY MOUTH EVERY MORNING 10/01/14  Yes Kathrynn Ducking, MD  pravastatin (PRAVACHOL) 40 MG tablet Take 1 tablet (40 mg total) by mouth every evening. 01/13/15  Yes Janith Lima, MD  saccharomyces boulardii (FLORASTOR) 250 MG capsule Take 2 capsules (500 mg total) by mouth  2 (two) times daily. 02/10/15  Yes Lori P Hvozdovic, PA-C  vancomycin (VANCOCIN) 50 mg/mL oral solution 2.5 ml (0.5 tablespoon) qid x 7 days then bid x 7 days then qd x 7 days then qod x 7 days then every 3rd day for 14 days. 02/11/15  Yes Lori P Hvozdovic, PA-C    Current Facility-Administered Medications  Medication Dose Route Frequency Provider Last Rate Last Dose  . 0.9 %  sodium chloride infusion  1,000 mL Intravenous Continuous Gloriann Loan, PA 125 mL/hr at 02/26/15 1156 1,000 mL at 02/26/15 1156   Current Outpatient Prescriptions  Medication Sig Dispense Refill  . aspirin 81 MG tablet Take 81 mg by mouth daily.    . Calcium Carbonate-Vitamin D (CALCIUM 500 + D PO) Take 1 tablet by mouth daily.    . colchicine 0.6 MG tablet Take 1 tablet (0.6 mg total) by mouth 2 (two) times daily. 60 tablet 2  . digoxin (LANOXIN) 0.125 MG tablet Take 1 tablet (125 mcg total) by mouth daily. 90 tablet 1  . diphenoxylate-atropine (LOMOTIL) 2.5-0.025 MG per tablet Take 1 tablet by mouth 4 (four) times daily as needed for diarrhea or loose stools. 60 tablet 2  . furosemide (LASIX) 20 MG tablet Take 1 tablet (20 mg total) by mouth daily. 30 tablet 5  . levETIRAcetam (KEPPRA) 750 MG tablet Take 2 tablets (1,500 mg total) by mouth 2 (two) times daily. 360 tablet 3  . levothyroxine (SYNTHROID, LEVOTHROID) 50 MCG tablet Take 1 tablet (50 mcg total) by mouth every evening. 90 tablet 3  . lidocaine (XYLOCAINE) 2 % solution Take 20 mLs by mouth every 3 (three) hours as needed  for mouth pain. Oral pain    . Mesalamine (ASACOL HD) 800 MG TBEC Take 2 tablets (1,600 mg total) by mouth 3 (three) times daily. NEEDS OFFICE VISIT FOR FURTHER REFILLS 540 tablet 0  . Multiple Vitamin (MULTIVITAMIN WITH MINERALS) TABS Take 1 tablet by mouth daily.    Marland Kitchen PHENobarbital (LUMINAL) 97.2 MG tablet TAKE 1 TABLET BY MOUTH ONCE EVERY MORNING (Patient taking differently: TAKE 97.2 MG BY MOUTH EVERY MORNING) 90 tablet 1  . pravastatin (PRAVACHOL) 40 MG tablet Take 1 tablet (40 mg total) by mouth every evening. 90 tablet 1  . saccharomyces boulardii (FLORASTOR) 250 MG capsule Take 2 capsules (500 mg total) by mouth 2 (two) times daily. 120 capsule 1  . vancomycin (VANCOCIN) 50 mg/mL oral solution 2.5 ml (0.5 tablespoon) qid x 7 days then bid x 7 days then qd x 7 days then qod x 7 days then every 3rd day for 14 days. 145 mL 0    Allergies as of 02/26/2015 - Review Complete 02/26/2015  Allergen Reaction Noted  . Xarelto [rivaroxaban]  12/03/2014  . Diltiazem Other (See Comments) 10/29/2014    Family History  Problem Relation Age of Onset  . Coronary artery disease Mother   . Heart disease Mother   . Diabetes Sister   . Breast cancer Maternal Aunt   . Breast cancer Sister   . Colon cancer      nephew (sister's son)    Social History   Social History  . Marital Status: Married    Spouse Name: Marden Noble  . Number of Children: 2  . Years of Education: 12   Occupational History  . retired     Art therapist   Social History Main Topics  . Smoking status: Former Smoker -- 67 years    Quit date: 10/26/2004  .  Smokeless tobacco: Never Used     Comment: quit in October 23, 2005  . Alcohol Use: 0.0 oz/week    0 Standard drinks or equivalent per week     Comment: normally 1 glass wine after dinner - occasional  . Drug Use: No  . Sexual Activity: No   Other Topics Concern  . Not on file   Social History Narrative   HSG. Married. married 10-24-2067. 1 son- died as a neonate, 1 son- 2058/10/24. Retired-  worked as a Art therapist.   End of life: has a living will- does not want futile/ heroic care; no cpr.     Marriage- reports marriage is in good health (4/10)      Patient is left handed.   Patient drinks 3-4 cups of tea daily.                Review of Systems: Ten point ROS is O/W negative except as mentioned in HPI.  Physical Exam: Vital signs in last 24 hours: Temp:  [97.8 F (36.6 C)] 97.8 F (36.6 C) (08/25 1044) Pulse Rate:  [104] 104 (08/25 1044) Resp:  [20] 20 (08/25 1044) BP: (106)/(52) 106/52 mmHg (08/25 1044) SpO2:  [92 %] 92 % (08/25 1044)   General:  Alert, chronically ill-appearing, pleasant and cooperative in NAD Head:  Normocephalic and atraumatic. Eyes:  Sclera clear, no icterus.  Conjunctiva pink. Ears:  Normal auditory acuity. Mouth:  No deformity or lesions. Neck:  Scars noted.  Stoma in place Lungs:  Clear throughout to auscultation.  No wheezes, crackles, or rhonchi.  Heart:  Irregular. Abdomen:  Soft, non-distended. BS present.  Non-tender.   Rectal:  Deferred  Msk:  Symmetrical without gross deformities. Pulses:  Normal pulses noted. Extremities:  Without clubbing or edema. Neurologic:  Alert and  oriented x4;  grossly normal neurologically. Skin:  Intact without significant lesions or rashes. Psych:  Alert and cooperative. Normal mood and affect.  Lab Results:  Recent Labs  02/26/15 1142  WBC 10.9*  HGB 10.1*  HCT 31.0*  PLT 235   BMET  Recent Labs  02/26/15 1142  NA 132*  K 3.5  CL 97*  CO2 30  GLUCOSE 135*  BUN 8  CREATININE 0.66  CALCIUM 7.7*   LFT  Recent Labs  02/26/15 1142  PROT 5.9*  ALBUMIN 2.4*  AST 22  ALT 16  ALKPHOS 158*  BILITOT 0.6   IMPRESSION:  *74 year old female with a history of ulcerative colitis, C. difficile colitis, laryngeal cancer status post laryngectomy, hip and shoulder fracture complicated by right shoulder hardware infection, now with recurrent C. Difficile recently.  Still on  Vancomycin 125 mg four times daily and Florastor 500 mg BID.  Cdiff negative yesterday but ongoing diarrhea.  ? UC flare.  PLAN: -Will start solumedrol 40 mg IV daily.  Continue vancomycin and florastor for now. -If she does not show improvement then may need flex sig but will assess response to steroids first.  ZEHR, JESSICA D.  02/26/2015, 2:14 PM  Pager number [phone]  GI ATTENDING  History,labs,prior endoscopy notes reviewed. Agree with Consult note as scribed.It may be that U.C. is primary driver of diarrhea at this point (recent C.diff negative). Needs hydration and initiation of steroids (has responded before). Possibly antidiarrheals. Hold off on flex sig for now, but may need if no significant response in the next few days.  Docia Chuck. Geri Seminole., M.D. [facility] Division of Gastroenterology

## 2015-02-26 NOTE — Telephone Encounter (Signed)
Husband aware. He is very concerned because she has gotten so weak. She could not get up this am on her own, he had to help her walk to the table. Pt fell last night and hit her arm and head, when he helped her up she is having pain in her arm. Dr. Hilarie Fredrickson wants pt to go to [facility] ER, states she probably needs to be admitted and if she needs a flex-sig to be done this is a quicker way to have it done. May need flex-sig to see if she has UC flaring on to of the diarrhea from cdiff. Pts husband aware and will take her to the ER. ER Charge nuirse aware and Dr. Hilarie Fredrickson to speak with Alonza Bogus PA to let her know pt will be coming to the ER.

## 2015-02-26 NOTE — ED Notes (Signed)
Wound care provided, dressing applied to R elbow. Pt resting on stretcher with eyes closed, family at bedside.  Pt denies further needs/complaints at this time.  NAD.

## 2015-02-26 NOTE — ED Notes (Signed)
Pt c/o intermittent diarrhea x "a few weeks."  Pt's husband reports Pt was diagnosed w/ C Diff x 4 weeks ago.  Denies pain.  Denies n/v.

## 2015-02-26 NOTE — Telephone Encounter (Signed)
Would not start the Dificid at that price Continue vancomycin 250 4 times a day, await ID input, await response to steroid given UC

## 2015-02-27 DIAGNOSIS — I482 Chronic atrial fibrillation: Secondary | ICD-10-CM

## 2015-02-27 DIAGNOSIS — Z8719 Personal history of other diseases of the digestive system: Secondary | ICD-10-CM

## 2015-02-27 DIAGNOSIS — K51918 Ulcerative colitis, unspecified with other complication: Secondary | ICD-10-CM

## 2015-02-27 LAB — CBC
HEMATOCRIT: 28.2 % — AB (ref 36.0–46.0)
HEMOGLOBIN: 9.5 g/dL — AB (ref 12.0–15.0)
MCH: 31.8 pg (ref 26.0–34.0)
MCHC: 33.7 g/dL (ref 30.0–36.0)
MCV: 94.3 fL (ref 78.0–100.0)
Platelets: 257 10*3/uL (ref 150–400)
RBC: 2.99 MIL/uL — ABNORMAL LOW (ref 3.87–5.11)
RDW: 18.3 % — AB (ref 11.5–15.5)
WBC: 11.1 10*3/uL — AB (ref 4.0–10.5)

## 2015-02-27 LAB — BASIC METABOLIC PANEL
ANION GAP: 6 (ref 5–15)
BUN: 7 mg/dL (ref 6–20)
CALCIUM: 7.4 mg/dL — AB (ref 8.9–10.3)
CO2: 25 mmol/L (ref 22–32)
Chloride: 102 mmol/L (ref 101–111)
Creatinine, Ser: 0.55 mg/dL (ref 0.44–1.00)
GFR calc Af Amer: >60 mL/min (ref >60)
GLUCOSE: 88 mg/dL (ref 65–99)
POTASSIUM: 3.7 mmol/L (ref 3.5–5.1)
SODIUM: 133 mmol/L — AB (ref 135–145)

## 2015-02-27 MED ORDER — SODIUM CHLORIDE 0.9 % IV SOLN
INTRAVENOUS | Status: AC
  Administered 2015-02-27 – 2015-02-28 (×2): via INTRAVENOUS

## 2015-02-27 MED ORDER — PHENOBARBITAL 32.4 MG PO TABS
97.2000 mg | ORAL_TABLET | Freq: Every day | ORAL | Status: DC
  Administered 2015-02-27 – 2015-02-28 (×2): 97.2 mg via ORAL
  Filled 2015-02-27: qty 3

## 2015-02-27 NOTE — Plan of Care (Signed)
Problem: Phase I Progression Outcomes Goal: OOB as tolerated unless otherwise ordered Outcome: Progressing Requires one assist to [facility].

## 2015-02-27 NOTE — Care Management Note (Signed)
Case Management Note  Patient Details  Name: [name] MRN: [id number] Date of Birth: [birth year]  Subjective/Objective:           diarrhea and poss c,diff with recent history and treatment of the same         Action/Plan: Date:  February 27, 2015 U.R. performed for needs and level of care. Will continue to follow for Case Management needs.  Velva Harman, RN, BSN, [place]   [phone]  Expected Discharge Date:   (unknown)               Expected Discharge Plan:  Home/Self Care  In-House Referral:  NA  Discharge planning Services  CM Consult  Post Acute Care Choice:  NA Choice offered to:  NA  DME Arranged:    DME Agency:     HH Arranged:    [place] Agency:     Status of Service:  Completed, signed off  Medicare Important Message Given:    Date Medicare IM Given:    Medicare IM give by:    Date Additional Medicare IM Given:    Additional Medicare Important Message give by:     If discussed at [place] of Stay Meetings, dates discussed:    Additional Comments:  Leeroy Cha, RN 02/27/2015, 10:51 AM
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
## 2015-02-27 NOTE — Progress Notes (Addendum)
PROGRESS NOTE    [name] XTG:[id number] DOB: [birth year] DOA: 02/26/2015 PCP: Scarlette Calico, MD  HPI/Brief narrative 74 year old female patient with history of ulcerative colitis, recurrent C. difficile colitis-still on vancomycin (has appointment with ID in mid-September), laryngeal cancer status post laryngectomy, hip and shoulder fracture complicated by right shoulder hardware infection, UC flare February 2016-responded to steroid taper, recently diagnosed leukocytoclastic vasculitis presented to the [facility] on 02/26/15 with worsening diarrhea, weakness and falls at home. C. difficile testing was negative. [facility] consulted. Started on IV Solu-Medrol for suspected UC flare with improvement.   Assessment/Plan:  Possible IBD flare - C. difficile negative - [name] consulted and patient was started on IV Solu-Medrol with clinical improvement. Also on mesalamine. - If patient continues to eat well and diarrhea continues to improve, possible discharge over the weekend on oral prednisone - Continue oral vancomycin & Florastor with plans for gradual taper as outpatient and follow up with GI and ID  Recurrent C. difficile colitis - Still on vancomycin 125 MG 4 times daily and Florastor 500 MG 2 times daily. Continue same. - C. difficile testing 2 on 8/24 and 8/25: Negative.  Anemia - Hemoglobin has gradually dropped from 11.9 on 8/9 to 9.5. Possibly due to hemodilution. Follow CBC in a.m.  Chronic hyponatremia - Stable. Clinically euvolemic.  Laryngeal cancer status post laryngectomy  Seizure disorder - Continue Keppra (pharmacy is recommending reducing dose from 1500 BID to 1000 mg BID) Also need to verify with patient if she is on Phenobarb at home. Discussed with pharmacy.  Hypothyroid - Continue Synthroid  Permanent A. Fib - chads2vasc score is 7. She is not a candidate for anticoagulation given prior SDH and frequent falls. - Controlled ventricular  rate. Continue digoxin. - Continue aspirin.  Essential hypertension - Controlled  History of stroke - Continue aspirin  HLD - Statins  Falls - PT evaluation.    DVT prophylaxis: Lovenox Code Status: Full Family Communication: None at bedside Disposition Plan: DC home possibly 8/27.   Consultants:  [facility]  Procedures:  None  Antibiotics:  Oral vancomycin-continued from PTA   Subjective: Feels much better. States no BM since last night. Denies nausea or vomiting. Anxious to go home.  Objective: Filed Vitals:   02/27/15 0550 02/27/15 1036 02/27/15 1147 02/27/15 1338  BP: 112/50   110/50  Pulse: 87 74 79 78  Temp: 98.8 F (37.1 C)   98.4 F (36.9 C)  TempSrc: Oral   Oral  Resp: 20  22 20   Height:      Weight:      SpO2: 94%  93% 95%    Intake/Output Summary (Last 24 hours) at 02/27/15 1514 Last data filed at 02/27/15 1400  Gross per 24 hour  Intake    600 ml  Output    477 ml  Net    123 ml   Filed Weights   02/26/15 1600 02/27/15 0505  Weight: 54.658 kg (120 lb 8 oz) 56.4 kg (124 lb 5.4 oz)     Exam:  General exam: Pleasant elderly female sitting up comfortably in bed this morning. Respiratory system: Clear. No increased work of breathing. Cardiovascular system: S1 & S2 heard, RRR. No JVD, murmurs, gallops, clicks or pedal edema. Gastrointestinal system: Abdomen is nondistended, soft and nontender. Normal bowel sounds heard. Central nervous system: Alert and oriented. No focal neurological deficits. Extremities: Symmetric 5 x 5 power.   Data Reviewed: Basic Metabolic Panel:  Recent Labs Lab  02/26/15 1142 02/27/15 0530  NA 132* 133*  K 3.5 3.7  CL 97* 102  CO2 30 25  GLUCOSE 135* 88  BUN 8 7  CREATININE 0.66 0.55  CALCIUM 7.7* 7.4*   Liver Function Tests:  Recent Labs Lab 02/26/15 1142  AST 22  ALT 16  ALKPHOS 158*  BILITOT 0.6  PROT 5.9*  ALBUMIN 2.4*   No results for input(s): LIPASE, AMYLASE in the last 168  hours. No results for input(s): AMMONIA in the last 168 hours. CBC:  Recent Labs Lab 02/26/15 1142 02/27/15 0530  WBC 10.9* 11.1*  NEUTROABS 9.3*  --   HGB 10.1* 9.5*  HCT 31.0* 28.2*  MCV 95.4 94.3  PLT 235 257   Cardiac Enzymes: No results for input(s): CKTOTAL, CKMB, CKMBINDEX, TROPONINI in the last 168 hours. BNP (last 3 results)  Recent Labs  09/17/14 1420  PROBNP 180.0*   CBG: No results for input(s): GLUCAP in the last 168 hours.  Recent Results (from the past 240 hour(s))  Clostridium Difficile by PCR     Status: None   Collection Time: 02/25/15 12:37 PM  Result Value Ref Range Status   Toxigenic C Difficile by pcr Not Detected Not Detected Final    Comment: This test is for use only with liquid or soft stools; performance characteristics of other clinical specimen types have not been established.   This assay was performed by Cepheid GeneXpert(R) PCR. The performance characteristics of this assay have been determined by [employer]. Performance characteristics refer to the analytical performance of the test.   C difficile quick scan w PCR reflex     Status: Abnormal   Collection Time: 02/26/15  7:33 PM  Result Value Ref Range Status   C Diff antigen Negative for toxigenic C. difficile (A) NEGATIVE Final   C Diff toxin Negative for toxigenic C. difficile (A) NEGATIVE Final   C Diff interpretation VALID  Final         Studies: No results found.      Scheduled Meds: . antiseptic oral rinse  7 mL Mouth Rinse q12n4p  . aspirin EC  81 mg Oral Daily  . calcium-vitamin D   Oral Daily  . chlorhexidine  15 mL Mouth Rinse BID  . digoxin  125 mcg Oral Daily  . enoxaparin (LOVENOX) injection  40 mg Subcutaneous Q24H  . levETIRAcetam  1,500 mg Oral BID  . levothyroxine  50 mcg Oral QPM  . Mesalamine  1,600 mg Oral TID  . methylPREDNISolone (SOLU-MEDROL) injection  40 mg Intravenous Daily  . multivitamin with minerals  1 tablet Oral Daily  .  pravastatin  40 mg Oral QPM  . saccharomyces boulardii  500 mg Oral BID  . vancomycin  250 mg Oral 4 times per day   Continuous Infusions: . sodium chloride 75 mL/hr at 02/27/15 1341    Active Problems:   Diarrhea   Dehydration   Absolute anemia    Time spent: 35 minutes.    Vernell Leep, MD, FACP, FHM. [facility] Pager [phone]  If 7PM-7AM, please contact night-coverage [email or web address] Password [email or web address] 02/27/2015, 3:14 PM    LOS: 1 day

## 2015-02-27 NOTE — Progress Notes (Signed)
     [facility] Progress Note  Subjective:  Three stools documented.  Patient says on one this AM.  Says that she is feeling better and actually looks much better.  Eating breakfast well; says that she "is eating like a pig".  Objective:  Vital signs in last 24 hours: Temp:  [97.8 F (36.6 C)-98.8 F (37.1 C)] 98.8 F (37.1 C) (08/26 0550) Pulse Rate:  [74-104] 87 (08/26 0550) Resp:  [18-20] 20 (08/26 0550) BP: (100-115)/(47-54) 112/50 mmHg (08/26 0550) SpO2:  [92 %-96 %] 94 % (08/26 0550) Weight:  [120 lb 8 oz (54.658 kg)-124 lb 5.4 oz (56.4 kg)] 124 lb 5.4 oz (56.4 kg) (08/26 0505) Last BM Date: 02/26/15 General:  Alert, chronically ill-appearing, in NAD Heart:  Irregular. Pulm:  CTAB. Abdomen:  Soft, non-distended.  BS present.  Non-tender. Extremities:  Without edema. Neurologic:  Alert and  oriented x4;  grossly normal neurologically. Psych:  Alert and cooperative. Normal mood and affect.  Intake/Output from previous day: 08/25 0701 - 08/26 0700 In: -  Out: 2 [Urine:1; Stool:1]  Lab Results:  Recent Labs  02/26/15 1142 02/27/15 0530  WBC 10.9* 11.1*  HGB 10.1* 9.5*  HCT 31.0* 28.2*  PLT 235 257   BMET  Recent Labs  02/26/15 1142 02/27/15 0530  NA 132* 133*  K 3.5 3.7  CL 97* 102  CO2 30 25  GLUCOSE 135* 88  BUN 8 7  CREATININE 0.66 0.55  CALCIUM 7.7* 7.4*   LFT  Recent Labs  02/26/15 1142  PROT 5.9*  ALBUMIN 2.4*  AST 22  ALT 16  ALKPHOS 158*  BILITOT 0.6   Assessment / Plan: *74 year old female with a history of ulcerative colitis, C. difficile colitis, laryngeal cancer status post laryngectomy, hip and shoulder fracture complicated by right shoulder hardware infection, now with recurrent C. Difficile recently. Still on Vancomycin 125 mg four times daily and Florastor 500 mg BID. Cdiff negative x 2 in last 48 hours but ongoing diarrhea. ? UC flare.  Hgb down some, but likely dilutional as I suspect that she was dehydrated  and hemo-concentrated previously.  -Solumedrol 40 mg IV daily started yesterday. Continue vancomycin and florastor for now as well. -If she does not show improvement then may need flex sig but will assess response to steroids first.   LOS: 1 day   ZEHR, JESSICA D.  02/27/2015, 7:29 AM  Pager number [phone]  GI ATTENDING  Interval history and data reviewed. Patient personally seen and examined. Husband in room at bedside. Agree with interval progress note as outlined above. Patient is feeling much better since hospital admission. Well-hydrated. Only one soft bowel movement today. Started on steroids yesterday for possible IBD flare. Last check, Clostridium difficile negative. On vancomycin during hospitalization. If she continues to eat well with manageable bowel habits, could go home on oral prednisone with close follow-up with Dr. Hilarie Fredrickson. We will continue to follow through the weekend. Discussed with husband.  Docia Chuck. Geri [place]., M.D. [facility] Division of Gastroenterology

## 2015-02-28 DIAGNOSIS — D638 Anemia in other chronic diseases classified elsewhere: Secondary | ICD-10-CM

## 2015-02-28 LAB — CBC
HCT: 31 % — ABNORMAL LOW (ref 36.0–46.0)
Hemoglobin: 10.3 g/dL — ABNORMAL LOW (ref 12.0–15.0)
MCH: 31.5 pg (ref 26.0–34.0)
MCHC: 33.2 g/dL (ref 30.0–36.0)
MCV: 94.8 fL (ref 78.0–100.0)
Platelets: 297 10*3/uL (ref 150–400)
RBC: 3.27 MIL/uL — ABNORMAL LOW (ref 3.87–5.11)
RDW: 18.2 % — ABNORMAL HIGH (ref 11.5–15.5)
WBC: 9.7 10*3/uL (ref 4.0–10.5)

## 2015-02-28 LAB — BASIC METABOLIC PANEL
Anion gap: 3 — ABNORMAL LOW (ref 5–15)
BUN: 5 mg/dL — AB (ref 6–20)
CO2: 26 mmol/L (ref 22–32)
CREATININE: 0.44 mg/dL (ref 0.44–1.00)
Calcium: 7.9 mg/dL — ABNORMAL LOW (ref 8.9–10.3)
Chloride: 105 mmol/L (ref 101–111)
GFR calc Af Amer: >60 mL/min (ref >60)
Glucose, Bld: 100 mg/dL — ABNORMAL HIGH (ref 65–99)
POTASSIUM: 3.7 mmol/L (ref 3.5–5.1)
SODIUM: 134 mmol/L — AB (ref 135–145)

## 2015-02-28 MED ORDER — PREDNISONE 20 MG PO TABS: 40.0000 mg | ORAL_TABLET | Freq: Every day | ORAL | Status: DC

## 2015-02-28 NOTE — Evaluation (Signed)
Physical Therapy Evaluation Patient Details Name: [name] MRN: [id number] DOB: [birth year] Today's Date: 02/28/2015   History of Present Illness  74 y.o. female with known history of UC, laryngeal cancer s/p laryngectomy, last seen by GI in February 2016 for colitis flare up (C. Diff ruled out at that time and pt treated with Prednisone), recently diagnosed with leukocytoclastic vasculitis, last C. Diff on 01/27/2015 and was started on oral vancomycin 125 mg 4 x day (to complete 14 days therapy, still on it), now presented to [facility] ED for evaluation of persistent watery diarrhea and generalized weakness, poor oral intake.   Clinical Impression  Pt admitted with above diagnosis. Pt currently with functional limitations due to the deficits listed below (see PT Problem List).  Pt will benefit from skilled PT to increase their independence and safety with mobility to allow discharge to the venue listed below. Pt with general unsteadiness while ambulating without RW.  Educated pt on using RW when she goes home and having husband A her with mobility.  Recommend HHPT to follow up on transitioning pt to improved ambulation without AD to decrease fall risk.      Follow Up Recommendations Home health PT    Equipment Recommendations  None recommended by PT    Recommendations for Other Services       Precautions / Restrictions Precautions Precautions: Fall      Mobility  Bed Mobility               General bed mobility comments: sitting EOB upon arrival  Transfers Overall transfer level: Modified independent Equipment used: None             General transfer comment: stands from bed with good use of UE  Ambulation/Gait Ambulation/Gait assistance: Min guard Ambulation Distance (Feet): 110 Feet Assistive device: None Gait Pattern/deviations: Drifts right/left;Decreased stride length     General Gait Details: Pt ambulating without AD with some general unsteadiness with  decreased step length and pt states is from not having been up in a few days.  Feel pt would be safer with RW until she gets over illness.  Pt needing to be suctioned and declined any further ambulation and declined gait with RW.  Discussed with pt and she nodded in understanding.  Stairs            Wheelchair Mobility    Modified Rankin (Stroke Patients Only)       Balance Overall balance assessment: Needs assistance   Sitting balance-Leahy Scale: Normal       Standing balance-Leahy Scale: Fair                               Pertinent Vitals/Pain Pain Assessment: No/denies pain    Home Living Family/patient expects to be discharged to:: Private residence Living Arrangements: Spouse/significant other   Type of Home: House Home Access: Stairs to enter   [employer] of Steps: 4 Home Layout: One level Home Equipment: Cane - single point;Walker - 2 wheels      Prior Function                 Hand Dominance   Dominant Hand: Right    Extremity/Trunk Assessment   Upper Extremity Assessment: Overall WFL for tasks assessed           Lower Extremity Assessment: Generalized weakness;Overall [facility] for tasks assessed      Cervical / Trunk Assessment: Normal  Communication   Communication: Other (comment)  Cognition Arousal/Alertness: Awake/alert Behavior During Therapy: WFL for tasks assessed/performed Overall Cognitive Status: Within Functional Limits for tasks assessed                      General Comments General comments (skin integrity, edema, etc.): Pt at times difficult to understand with elecctric voice box.    Exercises        Assessment/Plan    PT Assessment Patient needs continued PT services  PT Diagnosis Difficulty walking;Generalized weakness   PT Problem List Decreased activity tolerance;Decreased balance;Decreased mobility  PT Treatment Interventions Functional mobility training;Therapeutic  exercise;Therapeutic activities;Gait training;Stair training;Balance training;DME instruction   PT Goals (Current goals can be found in the Care Plan section) Acute Rehab PT Goals Patient Stated Goal: to go home PT Goal Formulation: With patient Time For Goal Achievement: 03/07/15 Potential to Achieve Goals: Good    Frequency Min 3X/week   Barriers to discharge        Co-evaluation               End of Session Equipment Utilized During Treatment: Gait belt Activity Tolerance: Patient tolerated treatment well Patient left: in chair;with call bell/phone within reach;with nursing/sitter in room Nurse Communication: Mobility status         Time: 4287-6811 PT Time Calculation (min) (ACUTE ONLY): 21 min   Charges:   PT Evaluation $Initial PT Evaluation Tier I: 1 Procedure     PT G Codes:        [name] 02/28/2015, 9:30 AM

## 2015-02-28 NOTE — Progress Notes (Signed)
     [facility] Progress Note  Subjective:  Last BM was yesterday.  Eating all of her meals.    Objective:  Vital signs in last 24 hours: Temp:  [97.5 F (36.4 C)-98.8 F (37.1 C)] 97.5 F (36.4 C) (08/27 0523) Pulse Rate:  [74-88] 88 (08/27 0523) Resp:  [18-22] 20 (08/27 0523) BP: (110-140)/(50-72) 140/53 mmHg (08/27 0523) SpO2:  [93 %-99 %] 95 % (08/27 0523) Weight:  [125 lb 3.5 oz (56.8 kg)-125 lb 10.6 oz (57 kg)] 125 lb 3.5 oz (56.8 kg) (08/27 0721) Last BM Date: 02/27/15 General:  Alert, chronically ill-appearing, in NAD Heart:  Regular rate and rhythm; no murmurs Pulm:  CTAB.  No W/R/R. Abdomen:  Soft, non-distended. Normal bowel sounds.  Non-tender. Extremities:  Without edema. Neurologic:  Alert and  oriented x4;  grossly normal neurologically. Psych:  Alert and cooperative. Normal mood and affect.  Intake/Output from previous day: 08/26 0701 - 08/27 0700 In: 600 [I.V.:600] Out: 675 [Urine:675]   Lab Results:  Recent Labs  02/26/15 1142 02/27/15 0530 02/28/15 0525  WBC 10.9* 11.1* 9.7  HGB 10.1* 9.5* 10.3*  HCT 31.0* 28.2* 31.0*  PLT 235 257 297   BMET  Recent Labs  02/26/15 1142 02/27/15 0530 02/28/15 0525  NA 132* 133* 134*  K 3.5 3.7 3.7  CL 97* 102 105  CO2 30 25 26   GLUCOSE 135* 88 100*  BUN 8 7 5*  CREATININE 0.66 0.55 0.44  CALCIUM 7.7* 7.4* 7.9*   LFT  Recent Labs  02/26/15 1142  PROT 5.9*  ALBUMIN 2.4*  AST 22  ALT 16  ALKPHOS 158*  BILITOT 0.6   Assessment / Plan: *74 year old female with a history of ulcerative colitis, C. difficile colitis, laryngeal cancer status post laryngectomy, hip and shoulder fracture complicated by right shoulder hardware infection, now with recurrent C. Difficile recently. Still on Vancomycin 125 mg four times daily and Florastor 500 mg BID. Cdiff negative x 2 in last 72 hours but ongoing diarrhea. Likely UC flare.  Improved on IV steroids.  -Ok for discharge today.  Can be  discharged on prednisone 40 mg daily for 2 weeks and we will have her husband contact our office at which time we can decide on taper.  She already has an appointment with Dr. Hilarie Fredrickson in 4 weeks. -Continue vancomycin and florastor for now as well; complete vancomycin taper as previously instructed.   LOS: 2 days   ZEHR, JESSICA D.  02/28/2015, 8:46 AM  Pager number [phone]   Agree with Ms. Alphia Kava management.  Gatha Mayer, MD, [facility]

## 2015-02-28 NOTE — Progress Notes (Signed)
Patient discharged.  Leaving with personal belongings.  Husband at side.  Denies pain.  Respirations even and unlabored.  One prescription.  No complaints.  Room air.
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
## 2015-02-28 NOTE — Discharge Instructions (Signed)
Ulcerative Colitis  Ulcerative colitis is a long lasting swelling and soreness (inflammation) of the colon (large intestine). In patients with ulcerative colitis, sores (ulcers) and inflammation of the inner lining of the colon lead to illness. Ulcerative colitis can also cause problems outside the digestive tract.   Ulcerative colitis is closely related to another condition of inflammation of the intestines called Crohn's disease. Together, they are frequently referred to as inflammatory bowel disease (IBD). Ulcerative colitis and Crohn's diseases are conditions that can last years to decades. Men and women are affected equally. They most commonly begin during adolescence and early adulthood.  SYMPTOMS   Common symptoms of ulcerative colitis include rectal bleeding and diarrhea. There is a wide range of symptoms among patients with this disease depending on how severe the disease is. Some of these symptoms are:  · Abdominal pain or cramping.  · Diarrhea.  · Fever.  · Tiredness (fatigue).  · Weight loss.  · Night sweats.  · Rectal pain.  · Feeling the immediate need to have a bowel movement (rectal urgency).  CAUSES   Ulcerative colitis is caused by increased activity of the immune system in the intestines. The immune system is the system that protects the body against disease such as harmful bacteria, viruses, fungi, and other foreign invaders. When the immune system overacts, it causes inflammation. The cause of the increased immune system activity is not known. This over activity causes long-lasting inflammation and ulceration. This condition may be passed down from your parents (inherited). Brothers, sisters, children, and parents of patients with IBD are more likely to develop these diseases. It is not contagious. This means you cannot catch it from someone else.  DIAGNOSIS   Your caregiver may suspect ulcerative colitis based on your symptoms and exam. Blood tests may confirm that there is a problem. You may  be asked to submit a stool specimen for examination. X-rays and CT scans may be necessary. Ultimately, the diagnosis is usually made after a flexible tube is inserted via your anus and your colon is examined under sedation (colonoscopy). With this test, the specialist can take a tiny tissue sample from inside the bowel (biopsy). Examination of this biopsy tissue under a microscopy can reveal ulcerative colitis as the cause of your symptoms.  TREATMENT   · There is no cure for ulcerative colitis.  · Complications such as massive bleeding from the colon (hemorrhage), development of a hole in the colon (perforation), or the development of precancerous or cancerous changes of the colon may require surgery.  · Medications are often used to decrease inflammation and control the immune system. These include medicines related to aspirin, steroid medications, and newer and stronger medications to slow down the immune system. Some medications may be used as suppositories or enemas. A number of other medications are used or have been studied. Your caregiver will make specific recommendations.  HOME CARE INSTRUCTIONS   · There is no cure for ulcerative colitis disease. The best treatment is frequent checkups with your caregiver. Periodic reevaluation is important.  · Symptoms such as diarrhea can be controlled with medications. Avoid foods that have a laxative effect such fresh fruit and vegetables and dairy products. During flare ups, you can rest your bowel by staying away from solid foods. Drink clear liquids frequently during the day. Electrolyte or rehydrating fluids are best. Your caregiver can help you with suggestions. Drink often to prevent dehydration. When diarrhea has cleared, eat smaller meals and more often. Avoid food additives   and stimulants such as caffeine (coffee, tea, many sodas, or chocolate). Avoid dairy products. Enzyme supplements may help if you develop intolerance to a sugar in dairy products  (lactose). Ask your caregiver or dietitian about specific dietary instructions.  · If you had surgery, be sure you understand your care instructions thoroughly, including proper care of any surgical wounds.  · Take any medications exactly as prescribed.  · Try to maintain a positive attitude. Learn relaxation techniques such as self hypnosis, mental imaging, and muscle relaxation. If possible, avoid stresses that aggravate your condition. Exercise regularly. Follow your diet. Always get plenty of rest.  SEEK MEDICAL CARE IF:   · Your symptoms fail to improve after a week or two of new treatment.  · You experience continued weight loss.  · You have ongoing crampy digestion or loose bowels.  · You develop a new skin rash, skin sores, or eye problems.  SEEK IMMEDIATE MEDICAL CARE IF:   · You have worsening of your symptoms or develop new symptoms.  · You have an oral temperature above 102° F (38.9° C), not controlled by medicine.  · You develop bloody diarrhea.  · You have severe abdominal pain.  Document Released: 03/30/2005 Document Revised: 09/12/2011 Document Reviewed: 02/27/2007  ExitCare® Patient Information ©2015 [employer]. This information is not intended to replace advice given to you by your health care provider. Make sure you discuss any questions you have with your health care provider.

## 2015-02-28 NOTE — Care Management Note (Signed)
Case Management Note  Patient Details  Name: [name] MRN: [id number] Date of Birth: 1940-12-31  Subjective/Objective:                  diarrhea   Action/Plan:  Home with home health services  Expected Discharge Date:  02/28/15           Expected Discharge Plan:  [place]  In-House Referral:  NA  Discharge planning Services  CM Consult  Post Acute Care Choice:  NA Choice offered to:  Spouse  DME Arranged:    DME Agency:  [facility] Arranged:    [facility] Agency:     Status of Service:  Completed, signed off  Medicare Important Message Given:    Date Medicare IM Given:    Medicare IM give by:    Date Additional Medicare IM Given:    Additional Medicare Important Message give by:     If discussed at [place] of Stay Meetings, dates discussed:    Additional Comments: CM spoke with patient's husband. He states they have used [facility] and [facility] in the past for home health. He selected [facility] for HHPT. Butch Penny at [facility] notified of the referral.  [name], RN 02/28/2015, 11:50 AM

## 2015-03-01 DIAGNOSIS — A0472 Enterocolitis due to Clostridium difficile, not specified as recurrent: Secondary | ICD-10-CM | POA: Insufficient documentation

## 2015-03-02 ENCOUNTER — Ambulatory Visit: Payer: PPO | Admitting: Physician Assistant

## 2015-03-02 ENCOUNTER — Telehealth: Payer: Self-pay | Admitting: Internal Medicine

## 2015-03-02 NOTE — Telephone Encounter (Signed)
[name] states pt was discharged from the hospital on Saturday. States she was negative for cdiff. He has given her vancomycin 4 times a day since discharge but wants to know if he should proceed with the taper. Also wants to know if they need to keep appt with ID. Please advise.

## 2015-03-02 NOTE — Telephone Encounter (Signed)
Would go ahead and start the taper as previously recommended I do think if possible would keep the ID appointment in case C. difficile recurs and we need to consider fecal transplantation

## 2015-03-02 NOTE — Telephone Encounter (Signed)
Spoke with Mr. Cortner and he is aware.

## 2015-03-03 ENCOUNTER — Telehealth: Payer: Self-pay | Admitting: Internal Medicine

## 2015-03-03 NOTE — Telephone Encounter (Signed)
Ok to give verbal 

## 2015-03-03 NOTE — Telephone Encounter (Signed)
[name] [id number]  Pt was  hospital  25th from the 27th.     Need elevated toilet seat with hand rails (husband would like to get a script for that on thurs when they come in for hosp fu)    Need verbal for home health  2 week 1 2 week 4

## 2015-03-03 NOTE — Telephone Encounter (Signed)
yes

## 2015-03-04 NOTE — Telephone Encounter (Signed)
Notified Beverly with md response...Johny Chess

## 2015-03-05 ENCOUNTER — Encounter: Payer: Self-pay | Admitting: Internal Medicine

## 2015-03-05 ENCOUNTER — Ambulatory Visit (INDEPENDENT_AMBULATORY_CARE_PROVIDER_SITE_OTHER): Payer: PPO | Admitting: Internal Medicine

## 2015-03-05 ENCOUNTER — Other Ambulatory Visit (INDEPENDENT_AMBULATORY_CARE_PROVIDER_SITE_OTHER): Payer: PPO

## 2015-03-05 VITALS — BP 128/60 | HR 97 | Temp 98.0°F | Resp 16 | Ht 65.0 in | Wt 126.0 lb

## 2015-03-05 DIAGNOSIS — I10 Essential (primary) hypertension: Secondary | ICD-10-CM | POA: Diagnosis not present

## 2015-03-05 DIAGNOSIS — D509 Iron deficiency anemia, unspecified: Secondary | ICD-10-CM | POA: Diagnosis not present

## 2015-03-05 DIAGNOSIS — K51918 Ulcerative colitis, unspecified with other complication: Secondary | ICD-10-CM

## 2015-03-05 DIAGNOSIS — Z23 Encounter for immunization: Secondary | ICD-10-CM | POA: Diagnosis not present

## 2015-03-05 DIAGNOSIS — I482 Chronic atrial fibrillation, unspecified: Secondary | ICD-10-CM

## 2015-03-05 DIAGNOSIS — A047 Enterocolitis due to Clostridium difficile: Secondary | ICD-10-CM

## 2015-03-05 DIAGNOSIS — R609 Edema, unspecified: Secondary | ICD-10-CM | POA: Diagnosis not present

## 2015-03-05 DIAGNOSIS — A0472 Enterocolitis due to Clostridium difficile, not specified as recurrent: Secondary | ICD-10-CM

## 2015-03-05 DIAGNOSIS — M310 Hypersensitivity angiitis: Secondary | ICD-10-CM

## 2015-03-05 LAB — BASIC METABOLIC PANEL
BUN: 8 mg/dL (ref 6–23)
CO2: 32 mEq/L (ref 19–32)
Calcium: 8.7 mg/dL (ref 8.4–10.5)
Chloride: 95 mEq/L — ABNORMAL LOW (ref 96–112)
Creatinine, Ser: 0.47 mg/dL (ref 0.40–1.20)
GFR: 136.69 mL/min (ref >60.00)
GLUCOSE: 124 mg/dL — AB (ref 70–99)
POTASSIUM: 4.4 meq/L (ref 3.5–5.1)
SODIUM: 131 meq/L — AB (ref 135–145)

## 2015-03-05 LAB — IBC PANEL
Iron: 50 ug/dL (ref 42–145)
SATURATION RATIOS: 20.6 % (ref 20.0–50.0)
Transferrin: 173 mg/dL — ABNORMAL LOW (ref 212.0–360.0)

## 2015-03-05 LAB — CBC WITH DIFFERENTIAL/PLATELET
BASOS ABS: 0 10*3/uL (ref 0.0–0.1)
Basophils Relative: 0.1 % (ref 0.0–3.0)
EOS PCT: 0 % (ref 0.0–5.0)
Eosinophils Absolute: 0 10*3/uL (ref 0.0–0.7)
HEMATOCRIT: 33.2 % — AB (ref 36.0–46.0)
HEMOGLOBIN: 10.8 g/dL — AB (ref 12.0–15.0)
LYMPHS ABS: 1.3 10*3/uL (ref 0.7–4.0)
LYMPHS PCT: 14.6 % (ref 12.0–46.0)
MCHC: 32.6 g/dL (ref 30.0–36.0)
MCV: 96.6 fl (ref 78.0–100.0)
MONOS PCT: 1.5 % — AB (ref 3.0–12.0)
Monocytes Absolute: 0.1 10*3/uL (ref 0.1–1.0)
Neutro Abs: 7.6 10*3/uL (ref 1.4–7.7)
Neutrophils Relative %: 83.8 % — ABNORMAL HIGH (ref 43.0–77.0)
Platelets: 352 10*3/uL (ref 150.0–400.0)
RBC: 3.44 Mil/uL — AB (ref 3.87–5.11)
RDW: 19.2 % — ABNORMAL HIGH (ref 11.5–15.5)
WBC: 9.1 10*3/uL (ref 4.0–10.5)

## 2015-03-05 LAB — FERRITIN: FERRITIN: 40.1 ng/mL (ref 10.0–291.0)

## 2015-03-05 NOTE — Patient Instructions (Signed)
Iron Deficiency Anemia Anemia is a condition in which there are less red blood cells or hemoglobin in the blood than normal. Hemoglobin is the part of red blood cells that carries oxygen. Iron deficiency anemia is anemia caused by too little iron. It is the most common type of anemia. It may leave you tired and short of breath. CAUSES   Lack of iron in the diet.  Poor absorption of iron, as seen with intestinal disorders.  Intestinal bleeding.  Heavy periods. SIGNS AND SYMPTOMS  Mild anemia may not be noticeable. Symptoms may include:  Fatigue.  Headache.  Pale skin.  Weakness.  Tiredness.  Shortness of breath.  Dizziness.  Cold hands and feet.  Fast or irregular heartbeat. DIAGNOSIS  Diagnosis requires a thorough evaluation and physical exam by your health care provider. Blood tests are generally used to confirm iron deficiency anemia. Additional tests may be done to find the underlying cause of your anemia. These may include:  Testing for blood in the stool (fecal occult blood test).  A procedure to see inside the colon and rectum (colonoscopy).  A procedure to see inside the esophagus and stomach (endoscopy). TREATMENT  Iron deficiency anemia is treated by correcting the cause of the deficiency. Treatment may involve:  Adding iron-rich foods to your diet.  Taking iron supplements. Pregnant or breastfeeding women need to take extra iron because their normal diet usually does not provide the required amount.  Taking vitamins. Vitamin C improves the absorption of iron. Your health care provider may recommend that you take your iron tablets with a glass of orange juice or vitamin C supplement.  Medicines to make heavy menstrual flow lighter.  Surgery. HOME CARE INSTRUCTIONS   Take iron as directed by your health care provider.  If you cannot tolerate taking iron supplements by mouth, talk to your health care provider about taking them through a vein  (intravenously) or an injection into a muscle.  For the best iron absorption, iron supplements should be taken on an empty stomach. If you cannot tolerate them on an empty stomach, you may need to take them with food.  Do not drink milk or take antacids at the same time as your iron supplements. Milk and antacids may interfere with the absorption of iron.  Iron supplements can cause constipation. Make sure to include fiber in your diet to prevent constipation. A stool softener may also be recommended.  Take vitamins as directed by your health care provider.  Eat a diet rich in iron. Foods high in iron include liver, lean beef, whole-grain bread, eggs, dried fruit, and dark green leafy vegetables. SEEK IMMEDIATE MEDICAL CARE IF:   You faint. If this happens, do not drive. Call your local emergency services (911 in [place] if no other help is available.  You have chest pain.  You feel nauseous or vomit.  You have severe or increased shortness of breath with activity.  You feel weak.  You have a rapid heartbeat.  You have unexplained sweating.  You become light-headed when getting up from a chair or bed. MAKE SURE YOU:   Understand these instructions.  Will watch your condition.  Will get help right away if you are not doing well or get worse. Document Released: 06/17/2000 Document Revised: 06/25/2013 Document Reviewed: 02/25/2013 ExitCare Patient Information 2015 [employer]. This information is not intended to replace advice given to you by your health care provider. Make sure you discuss any questions you have with your health care provider.  

## 2015-03-05 NOTE — Progress Notes (Signed)
Subjective:  Patient ID: [name], female    DOB: January 17, 1941  Age: 74 y.o. MRN: [id number]  CC: Anemia   HPI [name] presents for a hospital follow-up. She was admitted by GI for severe diarrhea, dehydration, abnormal electrolytes and worsening anemia. She continues to be on vancomycin for C. difficile infection. She also appears to have a flare of ulcerative colitis and has been placed on prednisone. She is happy to mention today that her diarrhea is much better. She still has a couple of loose stools each day but it is not as severe as it was. She takes Lomotil about twice a day for symptomatic relief. She is also happy to report that the red spots on her legs have resolved. She requests that she stop taking the colchicine. When she was admitted she had dehydration so they stopped her water pill. She now complains of worsening edema in her lower extremities and weight gain. She wants to restart the furosemide.  Outpatient Prescriptions Prior to Visit  Medication Sig Dispense Refill  . aspirin 81 MG tablet Take 81 mg by mouth daily.    . Calcium Carbonate-Vitamin D (CALCIUM 500 + D PO) Take 1 tablet by mouth daily.    . digoxin (LANOXIN) 0.125 MG tablet Take 1 tablet (125 mcg total) by mouth daily. 90 tablet 1  . diphenoxylate-atropine (LOMOTIL) 2.5-0.025 MG per tablet Take 1 tablet by mouth 4 (four) times daily as needed for diarrhea or loose stools. 60 tablet 2  . levETIRAcetam (KEPPRA) 750 MG tablet Take 2 tablets (1,500 mg total) by mouth 2 (two) times daily. 360 tablet 3  . levothyroxine (SYNTHROID, LEVOTHROID) 50 MCG tablet Take 1 tablet (50 mcg total) by mouth every evening. 90 tablet 3  . lidocaine (XYLOCAINE) 2 % solution Take 20 mLs by mouth every 3 (three) hours as needed for mouth pain. Oral pain    . Mesalamine (ASACOL HD) 800 MG TBEC Take 2 tablets (1,600 mg total) by mouth 3 (three) times daily. NEEDS OFFICE VISIT FOR FURTHER REFILLS 540 tablet 0  . Multiple  Vitamin (MULTIVITAMIN WITH MINERALS) TABS Take 1 tablet by mouth daily.    Marland Kitchen PHENobarbital (LUMINAL) 97.2 MG tablet TAKE 1 TABLET BY MOUTH ONCE EVERY MORNING (Patient taking differently: TAKE 97.2 MG BY MOUTH EVERY MORNING) 90 tablet 1  . pravastatin (PRAVACHOL) 40 MG tablet Take 1 tablet (40 mg total) by mouth every evening. 90 tablet 1  . predniSONE (DELTASONE) 20 MG tablet Take 2 tablets (40 mg total) by mouth daily with breakfast. Or take as directed by your GI doctor. 60 tablet 0  . saccharomyces boulardii (FLORASTOR) 250 MG capsule Take 2 capsules (500 mg total) by mouth 2 (two) times daily. 120 capsule 1  . vancomycin (VANCOCIN) 50 mg/mL oral solution 2.5 ml (0.5 tablespoon) qid x 7 days then bid x 7 days then qd x 7 days then qod x 7 days then every 3rd day for 14 days. 145 mL 0  . colchicine 0.6 MG tablet Take 1 tablet (0.6 mg total) by mouth 2 (two) times daily. 60 tablet 2  . furosemide (LASIX) 20 MG tablet Take 1 tablet (20 mg total) by mouth daily. 30 tablet 5   No facility-administered medications prior to visit.    ROS Review of Systems  Constitutional: Positive for fatigue and unexpected weight change (5 pound weight gain). Negative for fever, diaphoresis and appetite change.  HENT: Negative.   Eyes: Negative.   Respiratory: Negative.  Negative for cough, choking, chest tightness, shortness of breath, wheezing and stridor.   Cardiovascular: Positive for leg swelling. Negative for chest pain and palpitations.  Gastrointestinal: Positive for diarrhea. Negative for nausea, vomiting, abdominal pain, constipation, blood in stool, anal bleeding and rectal pain.  Endocrine: Negative.   Genitourinary: Negative.   Musculoskeletal: Negative.   Skin: Negative.   Allergic/Immunologic: Negative.   Neurological: Negative.  Negative for dizziness, tremors, weakness, light-headedness and numbness.  Hematological: Negative.   Psychiatric/Behavioral: Negative.     Objective:  BP 128/60  mmHg  Pulse 97  Temp(Src) 98 F (36.7 C) (Oral)  Resp 16  Ht 5\' 5"  (1.651 m)  Wt 126 lb (57.153 kg)  BMI 20.97 kg/m2  SpO2 97%  BP Readings from Last 3 Encounters:  03/05/15 128/60  02/28/15 133/75  02/20/15 83/47    Wt Readings from Last 3 Encounters:  03/05/15 126 lb (57.153 kg)  02/28/15 125 lb 3.5 oz (56.8 kg)  02/20/15 114 lb 6.4 oz (51.891 kg)    Physical Exam  Constitutional: She is oriented to person, place, and time. She appears well-developed and well-nourished. No distress.  HENT:  Mouth/Throat: Oropharynx is clear and moist. No oropharyngeal exudate.  Eyes: Conjunctivae are normal. Right eye exhibits no discharge. Left eye exhibits no discharge. No scleral icterus.  Neck: Normal range of motion. Neck supple. No JVD present. No tracheal deviation present. No thyromegaly present.  Cardiovascular: Normal rate, regular rhythm, normal heart sounds and intact distal pulses.  Exam reveals no gallop and no friction rub.   No murmur heard. Pulmonary/Chest: Effort normal and breath sounds normal. No stridor. No respiratory distress. She has no wheezes. She has no rales. She exhibits no tenderness.  Abdominal: Soft. Bowel sounds are normal. She exhibits no distension and no mass. There is no tenderness. There is no rebound and no guarding.  Musculoskeletal: Normal range of motion. She exhibits edema (3+ pitting edema in BLE). She exhibits no tenderness.  Lymphadenopathy:    She has no cervical adenopathy.  Neurological: She is oriented to person, place, and time.  Skin: Skin is warm, dry and intact. No rash noted. She is not diaphoretic. No pallor.  The puroura on her lower extremity have resolved.  Psychiatric: She has a normal mood and affect. Her behavior is normal. Judgment and thought content normal.    Lab Results  Component Value Date   WBC 9.1 03/05/2015   HGB 10.8* 03/05/2015   HCT 33.2* 03/05/2015   PLT 352.0 03/05/2015   GLUCOSE 124* 03/05/2015   CHOL 131  02/16/2015   TRIG 83.0 02/16/2015   HDL 52.80 02/16/2015   LDLCALC 61 02/16/2015   ALT 16 02/26/2015   AST 22 02/26/2015   NA 131* 03/05/2015   K 4.4 03/05/2015   CL 95* 03/05/2015   CREATININE 0.47 03/05/2015   BUN 8 03/05/2015   CO2 32 03/05/2015   TSH 3.73 02/16/2015   INR 1.24 01/07/2015   HGBA1C 5.7 02/10/2014    No results found.  Assessment & Plan:   [name] was seen today for anemia.  Diagnoses and all orders for this visit:  Essential hypertension- her blood pressure is well-controlled -     Basic metabolic panel; Future  Anemia, iron deficiency- improvement noted in her hemoglobin and hematocrit, I've asked her to continue the iron replacement therapy. -     CBC with Differential/Platelet; Future -     IBC panel; Future -     Ferritin; Future  Need  for prophylactic vaccination and inoculation against influenza -     Flu vaccine HIGH DOSE PF  I have discontinued [name]'s colchicine. I am also having her maintain her multivitamin with minerals, lidocaine, Calcium Carbonate-Vitamin D (CALCIUM 500 + D PO), aspirin, Mesalamine, levothyroxine, PHENobarbital, levETIRAcetam, pravastatin, digoxin, saccharomyces boulardii, vancomycin, diphenoxylate-atropine, predniSONE, and furosemide.  Meds ordered this encounter  Medications  . furosemide (LASIX) 20 MG tablet    Sig: Take 1 tablet (20 mg total) by mouth daily.    Dispense:  30 tablet    Refill:  3     Follow-up: Return in about 3 months (around 06/04/2015).  Scarlette Calico, MD

## 2015-03-05 NOTE — Progress Notes (Signed)
Pre visit review using our clinic review tool, if applicable. No additional management support is needed unless otherwise documented below in the visit note. 

## 2015-03-06 ENCOUNTER — Encounter: Payer: Self-pay | Admitting: Internal Medicine

## 2015-03-06 MED ORDER — FUROSEMIDE 20 MG PO TABS: 20.0000 mg | ORAL_TABLET | Freq: Every day | ORAL | Status: DC

## 2015-03-06 NOTE — Assessment & Plan Note (Signed)
Her chloride and sodium level remained low. She is having worsening edema, will restart low-dose Lasix and monitor her electrolytes closely.

## 2015-03-06 NOTE — Assessment & Plan Note (Signed)
She has good rate and rhythm control today 

## 2015-03-06 NOTE — Assessment & Plan Note (Signed)
This appears to be improving.

## 2015-03-06 NOTE — Assessment & Plan Note (Signed)
This appears to be improving with systemic steroids.

## 2015-03-06 NOTE — Assessment & Plan Note (Signed)
This appears to be resolving. Will discontinue colchicine for now. It is noted that she is on systemic steroids.

## 2015-03-10 ENCOUNTER — Telehealth: Payer: Self-pay | Admitting: Internal Medicine

## 2015-03-10 NOTE — Telephone Encounter (Signed)
Patient was seen last Thursday and stopped patient from taking colchicine .6mg .  States patient now has swelling in right lower leg and redness on both legs (primarily right leg).  Spouse was concerned that patient needed to start taking Colchicine again and started medication back up on Saturday.  There has been no change since medication was started back up on Saturday.  Would like to know if this is ok to continue or an OV is needed.

## 2015-03-11 ENCOUNTER — Ambulatory Visit (INDEPENDENT_AMBULATORY_CARE_PROVIDER_SITE_OTHER): Payer: PPO | Admitting: Internal Medicine

## 2015-03-11 ENCOUNTER — Encounter: Payer: Self-pay | Admitting: Internal Medicine

## 2015-03-11 VITALS — BP 122/72 | HR 89 | Temp 98.1°F | Resp 16 | Wt 122.0 lb

## 2015-03-11 DIAGNOSIS — E8809 Other disorders of plasma-protein metabolism, not elsewhere classified: Secondary | ICD-10-CM | POA: Diagnosis not present

## 2015-03-11 DIAGNOSIS — R609 Edema, unspecified: Secondary | ICD-10-CM | POA: Diagnosis not present

## 2015-03-11 DIAGNOSIS — E871 Hypo-osmolality and hyponatremia: Secondary | ICD-10-CM | POA: Diagnosis not present

## 2015-03-11 DIAGNOSIS — L02419 Cutaneous abscess of limb, unspecified: Secondary | ICD-10-CM | POA: Diagnosis not present

## 2015-03-11 DIAGNOSIS — L03119 Cellulitis of unspecified part of limb: Secondary | ICD-10-CM

## 2015-03-11 MED ORDER — MUPIROCIN 2 % EX OINT: TOPICAL_OINTMENT | CUTANEOUS | Status: DC

## 2015-03-11 NOTE — Telephone Encounter (Signed)
Pt is primarily concerned with swelling. States there right leg from ankle to thigh is swelling. Pt states he started back the colchicine on Sat. Will schedule an OV for today

## 2015-03-11 NOTE — Assessment & Plan Note (Signed)
03/11/15 edema no better with low-dose Lasix or elevation. Colchicine which had been discontinued was restarted by the patient

## 2015-03-11 NOTE — Assessment & Plan Note (Signed)
Status post vancomycin therapy. Stools are becoming more formed.

## 2015-03-11 NOTE — Progress Notes (Signed)
Pre visit review using our clinic review tool, if applicable. No additional management support is needed unless otherwise documented below in the visit note. 

## 2015-03-11 NOTE — Patient Instructions (Addendum)
Avoid ingestion of  excess salt/sodium.Cook with pepper & other spices . Use the salt substitute "No Salt" OR the Mrs Deliah Boston products to season food @ the table. Avoid foods which taste salty or "vinegary" as their sodium content will be high.  Albumin reflects nutrition; a can of Boost or Ensure recommended daily with recheck of albumin level  after 2-3 weeks.. Other options include plant protein (Ex soy) sources from [facility] as a supplement.Dose should not exceed per package label.  Drink Gatorade Lite in equal volume to water intake; not solely water . This will prevent loss of essential  Electrolytes.  Dip gauze in  sterile saline and applied to the wound twice a day. Cover the wound with Telfa , non stick dressing over the antibiotic ointment. The saline can be purchased at the drugstore or you can make your own .Boil cup of salt in a gallon of water. Store mixture  in a clean container.  If edema fails to respond to these maneuvers; increase the furosemide to 40 mg daily

## 2015-03-11 NOTE — Progress Notes (Signed)
   Subjective:    Patient ID: [name], female    DOB: [birth year], 74 y.o.   MRN: [id number]  HPI  She she returns with progressive edema.  Her complicated course was reviewed. She was seen by her primary care physician 03/05/15 following hospitalization for C. difficile. This was treated with vancomycin. She states the stool now is almost formed  Despite initiation of low-dose furosemide and elevation  the edema has progressed . 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  The vasculitis has recurred on the right lower extremity and they restarted it..  She states she restricts salt but she does eat peanut butter "for protein". She will drink up to 40 ounces of water daily. She is not supplementing the water with any fluids containing electrolytes.   Labs 03/05/15 were reviewed. Sodium was 131; chloride 95; BUN 8; creatinine 0.47; GFR was calculated as 136.69. Her albumen was 2.4 on 02/26/15. AST was 22 and ALT 16 on that date. She exhibited anemia which was improving on serial CBCs..   Review of Systems She's experiencing nocturia 3 times nightly.  Chest pain, palpitations, tachycardia,  paroxysmal nocturnal dyspnea,or  claudication  are absent.  Her fatigue persists.  Unexplained weight loss, abdominal pain, significant dyspepsia, dysphagia, melena, rectal bleeding, or persistently small caliber stools are denied.    Objective:   Physical Exam Pertinent or positive findings include: She exhibits marked pallor. She appears frail and suboptimally nourished.There is resolving ecchymosis of the right temple. She has a talking trach. Bilateral carotid bruits are noted. Heart rhythm is slightly irregular. She has scattered minor rhonchi. 2+ pedal edema is present. Pedal pulses are decreased. There is vasculitis over the right mid shin circumferentially. She has a 9 x 8 mm punch biopsy site of the left midshin  which appeared to be draining scant mucopurulent secretions   General appearance : in no distress.  Eyes: No conjunctival inflammation or scleral icterus is present.  Oral exam:  Lips and gums are healthy appearing.No cyanosis Heart:  Normal rate and regular rhythm. S1 and S2 normal without gallop, murmur, click, rub or other extra sounds    Lungs.No increased work of breathing.   Abdomen: bowel sounds normal, soft and non-tender without masses, organomegaly or hernias noted.  No guarding or rebound.   Vascular : all pulses equal ; no bruits present.  Skin:Warm & dry.  Intact without suspicious lesions or rashes ; no tenting or jaundice   Lymphatic: No lymphadenopathy is noted about the head, neck, axilla  Neuro: Strength, tone dramatically decreased.    Assessment & Plan:  #1 edema, multifactorial. Factors include some sodium intake as well as a decreased albumen.  This may also be dilutional view of the low sodium and chloride and large volume of free water intake.  #2 There appears to be some mild cellulitis with possible low-grade abscess of the left shin  Please see after visit summary and orders. If there is not improvement in the edema with increased protein intake; furosemide could be increased to 40 mg short-term. Chemistries would need to be monitored closely if furosemide is increased.

## 2015-03-11 NOTE — Telephone Encounter (Signed)
It is okay to restart the colchicine and let me know if the red spots get better.

## 2015-03-11 NOTE — Telephone Encounter (Signed)
Please advise 

## 2015-03-13 ENCOUNTER — Telehealth: Payer: Self-pay | Admitting: *Deleted

## 2015-03-13 NOTE — Telephone Encounter (Signed)
Patient husband called to cancel the appt set for 03/17/15 as the patient is having formed stools and no longer has CDiff. He advised they are following up with Dr Hilarie Fredrickson office 03/30/15. Advised him if they realize later that she needs to be seen to just give [place] a call and we will be glad to get her in.

## 2015-03-17 ENCOUNTER — Ambulatory Visit: Payer: PPO | Admitting: Internal Medicine

## 2015-03-20 ENCOUNTER — Telehealth: Payer: Self-pay | Admitting: Internal Medicine

## 2015-03-20 NOTE — Telephone Encounter (Signed)
Pt has been taking prednisone 40mg  daily. Prescription ran out and husband wants to know if she needs to continue taking the 40mg  dose until OV on 03/30/15. Please advise.

## 2015-03-20 NOTE — Telephone Encounter (Signed)
Spoke with pts husband and he is aware, knows to keep her appt.

## 2015-03-20 NOTE — Telephone Encounter (Signed)
Please have her decrease to 30 mg daily x 7 days, then decrease by 5 mg every 7 days until off If symptoms flare again during the taper, let me know Will see her soon at [place]

## 2015-03-23 LAB — HM DEXA SCAN

## 2015-03-24 DIAGNOSIS — R2689 Other abnormalities of gait and mobility: Secondary | ICD-10-CM | POA: Diagnosis not present

## 2015-03-30 ENCOUNTER — Encounter: Payer: Self-pay | Admitting: Internal Medicine

## 2015-03-30 ENCOUNTER — Ambulatory Visit (INDEPENDENT_AMBULATORY_CARE_PROVIDER_SITE_OTHER): Payer: PPO | Admitting: Internal Medicine

## 2015-03-30 VITALS — BP 94/60 | HR 76 | Ht 64.0 in | Wt 110.5 lb

## 2015-03-30 DIAGNOSIS — A0472 Enterocolitis due to Clostridium difficile, not specified as recurrent: Secondary | ICD-10-CM

## 2015-03-30 DIAGNOSIS — D500 Iron deficiency anemia secondary to blood loss (chronic): Secondary | ICD-10-CM

## 2015-03-30 DIAGNOSIS — K51911 Ulcerative colitis, unspecified with rectal bleeding: Secondary | ICD-10-CM

## 2015-03-30 DIAGNOSIS — E440 Moderate protein-calorie malnutrition: Secondary | ICD-10-CM

## 2015-03-30 DIAGNOSIS — A047 Enterocolitis due to Clostridium difficile: Secondary | ICD-10-CM

## 2015-03-30 MED ORDER — PREDNISONE 5 MG PO TABS: 5.0000 mg | ORAL_TABLET | Freq: Every day | ORAL | Status: DC

## 2015-03-30 NOTE — Progress Notes (Signed)
Subjective:    Patient ID: [name], female    DOB: 1941/01/17, 74 y.o.   MRN: [id number]  HPI  [name] is a 74 year old female with past medical history of ulcerative colitis, C. difficile colitis who seen for follow-up. She's here today with her husband. She also has a history of laryngeal cancer status post laryngectomy, hip and shoulder fracture complicated by hardware infection. Unfortunately since being seen here last she was hospitalized in August 2016 for bloody diarrhea. This was found to be recurrent C. difficile and ulcerative colitis flare. She has been taking a prednisone and vancomycin taper for each issue. Fortunately, she has improved tremendously. She is feeling well again. Starting to gain her strength back. Blood in her stool has ceased. She is having bowel movements that are more formed and thicker. This is occurring 2-3 times per day. She is occasionally using Lomotil which seems to help. She has weaned prednisone down to 20 mg daily and on Friday this will decrease to 10 mg daily 7 days. She is now tapered vancomycin to 1 dose every 3 days and has only 2 doses remaining. She does continue mesalamine at 4.8 g daily.  She was having issue with considerable lower extremity edema. Primary care has increased her oral furosemide and her husband states there has been dramatic improvement in lower extremity edema   Review of Systems As per history of present illness, otherwise negative  Current Medications, Allergies, Past Medical History, Past Surgical History, Family History and Social History were reviewed in [employer] record.     Objective:   Physical Exam BP 94/60 mmHg  Pulse 76  Ht 5\' 4"  (1.626 m)  Wt 110 lb 8 oz (50.122 kg)  BMI 18.96 kg/m2 Constitutional: Well-developed and thin female chronically ill-appearing but in no acute distress HEENT: Normocephalic and atraumatic. Oropharynx is clear and moist. No oropharyngeal exudate.  Conjunctivae are normal.  No scleral icterus. Neck: Stoma in place, anterior neck scarring Cardiovascular: Normal rate, regular rhythm and intact distal pulses. No M/R/G Pulmonary/chest: Effort normal and breath sounds normal. No wheezing, rales or rhonchi. Abdominal: Soft, nontender, nondistended. Bowel sounds active throughout.  Extremities: no clubbing, cyanosis, 1+ LE edema to the midshin Lymphadenopathy: No cervical adenopathy noted. Neurological: Alert and oriented to person place and time. Skin: Skin is warm and dry. No rashes noted. Psychiatric: Normal mood and affect. Behavior is normal.  CBC    Component Value Date/Time   WBC 9.1 03/05/2015 1613   RBC 3.44* 03/05/2015 1613   RBC 2.91* 10/30/2011 0502   HGB 10.8* 03/05/2015 1613   HCT 33.2* 03/05/2015 1613   PLT 352.0 03/05/2015 1613   MCV 96.6 03/05/2015 1613   MCH 31.5 02/28/2015 0525   MCHC 32.6 03/05/2015 1613   RDW 19.2* 03/05/2015 1613   LYMPHSABS 1.3 03/05/2015 1613   MONOABS 0.1 03/05/2015 1613   EOSABS 0.0 03/05/2015 1613   BASOSABS 0.0 03/05/2015 1613    CMP     Component Value Date/Time   NA 131* 03/05/2015 1613   K 4.4 03/05/2015 1613   CL 95* 03/05/2015 1613   CO2 32 03/05/2015 1613   GLUCOSE 124* 03/05/2015 1613   BUN 8 03/05/2015 1613   CREATININE 0.47 03/05/2015 1613   CALCIUM 8.7 03/05/2015 1613   PROT 5.9* 02/26/2015 1142   ALBUMIN 2.4* 02/26/2015 1142   AST 22 02/26/2015 1142   ALT 16 02/26/2015 1142   ALKPHOS 158* 02/26/2015 1142   BILITOT 0.6  02/26/2015 1142   GFRNONAA >60 02/28/2015 0525   GFRAA >60 02/28/2015 0525    Iron/TIBC/Ferritin/ %Sat    Component Value Date/Time   IRON 50 03/05/2015 1613   TIBC Not calculated due to Iron <10. 10/30/2011 0502   FERRITIN 40.1 03/05/2015 1613   IRONPCTSAT 20.6 03/05/2015 1613          Assessment & Plan:  74 year old female with past medical history of ulcerative colitis, C. difficile colitis who seen for follow-up.   1. UC  complicated by C. Diff with anemia and malnutrition -- unfortunately she has had a lot of trouble with her colitis lately which was UC complicated by C. difficile. She has dramatically improved and seems to be nearing clinical remission. I will have her continue her prednisone taper decreased to 10 mg at the end of this week for 1 week and then decrease to 5 mg for an additional week. She and her husband are asked to notify me if colitis symptoms should return while steroids or tapering to off. Continue 4.8 g of mesalamine daily. Complete vancomycin taper. Call if any recurrent C. difficile symptoms. Continue Florastor to 50 mg twice a day. Okay for Lomotil 4 times a day when necessary. She does have significant malnutrition and I have recommended that she continue taking boost and eating high protein foods. Appetite has picked back up and her husband has been encouraged recently with her oral intake. Flu vaccine recommended through primary care, return office visit in 3 months. Avoid NSAIDs. Anemia does not appear to be deficient at this time and hopefully will improve his bleeding has stopped as colitis has improved. Follow-up CBC at follow-up  25 minutes spent with patient today

## 2015-03-30 NOTE — Patient Instructions (Signed)
Please follow up with Dr Hilarie Fredrickson in 3 months.  Please continue to taper your Vancomycin.  Taper your prednisone to 20 mg through Saturday. Then, taper to 10 mg daily x 1 week, then 5 mg daily x 1 week, then discontinue. (We have sent some additional prednisone to get you to the end of your taper)  Please continue Asacol HD 4.8 grams daily.  You may continue Lomotil as needed.  Call our office at [phone] should you continue to have symptoms.

## 2015-04-02 ENCOUNTER — Other Ambulatory Visit: Payer: Self-pay | Admitting: Neurology

## 2015-04-06 ENCOUNTER — Telehealth: Payer: Self-pay | Admitting: *Deleted

## 2015-04-06 NOTE — Telephone Encounter (Signed)
Received call from pt husband needing to clarify if wife need to continue taking 40 mg of lasix, or go back to 20 mg. Per chart Dr. Linna Darner increase Lasix to 40 mg for short term. Verified if wife still have edema. Husband stated no her feet/leg has gone down a lot. Inform him pt can go back to just 1 20 mg tablet daily as rx by PCP...Johny Chess

## 2015-04-13 ENCOUNTER — Other Ambulatory Visit: Payer: Self-pay | Admitting: Internal Medicine

## 2015-04-13 DIAGNOSIS — M810 Age-related osteoporosis without current pathological fracture: Secondary | ICD-10-CM | POA: Insufficient documentation

## 2015-04-14 ENCOUNTER — Telehealth: Payer: Self-pay | Admitting: Internal Medicine

## 2015-04-14 NOTE — Telephone Encounter (Signed)
Notified Beverly with md response...[name]

## 2015-04-14 NOTE — Telephone Encounter (Signed)
Ok with me  Also, please let her know that the her bone density test was positive for mild osteoporosis - I have sent a referral to consider treating this

## 2015-04-14 NOTE — Telephone Encounter (Signed)
Started seeing patient on August 30th through 9/29.  They are wanting to continue services but was waiting on authorization from [facility].  Has received authorization from [facility].  Would like verbal for 6 more visits to help get patient walking better.

## 2015-05-11 ENCOUNTER — Telehealth: Payer: Self-pay | Admitting: Nurse Practitioner

## 2015-05-11 NOTE — Telephone Encounter (Signed)
Follow up      Returning a call to [employer]

## 2015-05-11 NOTE — Telephone Encounter (Signed)
Spoke with patient's husband. Patient has had problems with C. Diff and also deconditioning. They would like to hold off on Watchman for now. Husband is aware that procedure is now available if they would like to pursue in the future.    Chanetta Marshall, NP 05/11/2015 11:36 AM

## 2015-05-11 NOTE — Telephone Encounter (Signed)
Left message for patient to call. Dr Rayann Heman discussed Watchman at appt with patient earlier this year and she was interested in considering procedure once available. Will need to schedule screening TEE with either Dr Meda Coffee, [facility], or Nishan to evaluate LAA size and ability for device to fit.  I have left a message for the patient to call back to discuss. Would prefer to schedule TEE next week and try to do Watchman procedure 12/1 if that works for patient.   Please forward message to myself and Janan Halter, RN when patient calls back.  I am available intermittently today and tomorrow and may be able to call her back.  Chanetta Marshall, NP 05/11/2015 11:17 AM

## 2015-05-15 ENCOUNTER — Other Ambulatory Visit: Payer: Self-pay

## 2015-05-15 DIAGNOSIS — R600 Localized edema: Secondary | ICD-10-CM

## 2015-05-15 MED ORDER — FUROSEMIDE 20 MG PO TABS: 20.0000 mg | ORAL_TABLET | Freq: Every day | ORAL | Status: DC

## 2015-05-21 ENCOUNTER — Telehealth: Payer: Self-pay | Admitting: Internal Medicine

## 2015-05-21 NOTE — Telephone Encounter (Signed)
Spoke with pts husband and he is aware. Pt has appt with Dr. Hilarie Fredrickson at the end of the month.

## 2015-05-21 NOTE — Telephone Encounter (Signed)
Pts husband states that the pt has been having diarrhea for over a week with some blood in it. States it is not like cdiff, the odor is not there. Reports she has been off of prednisone for about 6 weeks. Please advise.

## 2015-05-21 NOTE — Telephone Encounter (Signed)
Lets go back to prednisone 10 mg daily and see if this is enough to keep colitis under control Hold at 10 mg x 1 month Call if not better and if better call at end of 1 month to discuss how to proceed, i.e. taper

## 2015-05-27 LAB — HM MAMMOGRAPHY

## 2015-06-01 ENCOUNTER — Encounter: Payer: Self-pay | Admitting: Internal Medicine

## 2015-06-08 ENCOUNTER — Encounter: Payer: Self-pay | Admitting: Internal Medicine

## 2015-06-08 ENCOUNTER — Ambulatory Visit (INDEPENDENT_AMBULATORY_CARE_PROVIDER_SITE_OTHER): Payer: PPO | Admitting: Internal Medicine

## 2015-06-08 ENCOUNTER — Other Ambulatory Visit (INDEPENDENT_AMBULATORY_CARE_PROVIDER_SITE_OTHER): Payer: PPO

## 2015-06-08 VITALS — BP 126/72 | HR 84 | Temp 98.6°F | Resp 20 | Ht 64.0 in | Wt 112.8 lb

## 2015-06-08 DIAGNOSIS — R569 Unspecified convulsions: Secondary | ICD-10-CM

## 2015-06-08 DIAGNOSIS — Z23 Encounter for immunization: Secondary | ICD-10-CM | POA: Diagnosis not present

## 2015-06-08 DIAGNOSIS — I10 Essential (primary) hypertension: Secondary | ICD-10-CM

## 2015-06-08 DIAGNOSIS — D509 Iron deficiency anemia, unspecified: Secondary | ICD-10-CM

## 2015-06-08 DIAGNOSIS — E038 Other specified hypothyroidism: Secondary | ICD-10-CM

## 2015-06-08 DIAGNOSIS — R7309 Other abnormal glucose: Secondary | ICD-10-CM | POA: Diagnosis not present

## 2015-06-08 LAB — CBC WITH DIFFERENTIAL/PLATELET
BASOS PCT: 0.1 % (ref 0.0–3.0)
Basophils Absolute: 0 10*3/uL (ref 0.0–0.1)
EOS ABS: 0.2 10*3/uL (ref 0.0–0.7)
EOS PCT: 1.6 % (ref 0.0–5.0)
HEMATOCRIT: 36.2 % (ref 36.0–46.0)
HEMOGLOBIN: 12 g/dL (ref 12.0–15.0)
LYMPHS PCT: 17.2 % (ref 12.0–46.0)
Lymphs Abs: 2.1 10*3/uL (ref 0.7–4.0)
MCHC: 33.2 g/dL (ref 30.0–36.0)
MCV: 97.5 fl (ref 78.0–100.0)
Monocytes Absolute: 0.7 10*3/uL (ref 0.1–1.0)
Monocytes Relative: 5.6 % (ref 3.0–12.0)
NEUTROS ABS: 9.3 10*3/uL — AB (ref 1.4–7.7)
Neutrophils Relative %: 75.5 % (ref 43.0–77.0)
PLATELETS: 248 10*3/uL (ref 150.0–400.0)
RBC: 3.71 Mil/uL — ABNORMAL LOW (ref 3.87–5.11)
RDW: 17.4 % — AB (ref 11.5–15.5)
WBC: 12.4 10*3/uL — AB (ref 4.0–10.5)

## 2015-06-08 LAB — IBC PANEL
IRON: 122 ug/dL (ref 42–145)
Saturation Ratios: 35.9 % (ref 20.0–50.0)
Transferrin: 243 mg/dL (ref 212.0–360.0)

## 2015-06-08 LAB — BASIC METABOLIC PANEL
BUN: 10 mg/dL (ref 6–23)
CHLORIDE: 92 meq/L — AB (ref 96–112)
CO2: 33 meq/L — AB (ref 19–32)
Calcium: 9.5 mg/dL (ref 8.4–10.5)
Creatinine, Ser: 0.61 mg/dL (ref 0.40–1.20)
GFR: 101.1 mL/min (ref >60.00)
Glucose, Bld: 130 mg/dL — ABNORMAL HIGH (ref 70–99)
POTASSIUM: 4.5 meq/L (ref 3.5–5.1)
Sodium: 130 mEq/L — ABNORMAL LOW (ref 135–145)

## 2015-06-08 LAB — HEMOGLOBIN A1C: Hgb A1c MFr Bld: 6 % (ref 4.6–6.5)

## 2015-06-08 LAB — FERRITIN: Ferritin: 27.4 ng/mL (ref 10.0–291.0)

## 2015-06-08 LAB — TSH: TSH: 1.23 u[IU]/mL (ref 0.35–4.50)

## 2015-06-08 MED ORDER — PHENOBARBITAL 100 MG PO TABS: 100.0000 mg | ORAL_TABLET | Freq: Every day | ORAL | Status: DC

## 2015-06-08 NOTE — Patient Instructions (Signed)

## 2015-06-08 NOTE — Progress Notes (Signed)
Pre visit review using our clinic review tool, if applicable. No additional management support is needed unless otherwise documented below in the visit note. 

## 2015-06-08 NOTE — Progress Notes (Signed)
Subjective:  Patient ID: [name], female    DOB: 29-Dec-1940  Age: 74 y.o. MRN: [id number]  CC: Anemia; Hypertension; and Hyperlipidemia   HPI [name] presents for follow-up. She's had a few episodes of diarrhea over the past 2 weeks and she tells me that she saw her gastroenterologist and was told that this is related to ulcerative colitis so she is taking some steroids. The diarrhea is improving. She offers no other complaints today. The rash on her legs has resolved. She has been told that next year her insurance will not cover phenobarbital at the 97.2 mg dose. She has been told by her insurance company that she needs 100 mg dose with and cover it.  Outpatient Prescriptions Prior to Visit  Medication Sig Dispense Refill  . aspirin 81 MG tablet Take 81 mg by mouth daily.    . Calcium Carbonate-Vitamin D (CALCIUM 500 + D PO) Take 1 tablet by mouth daily.    . digoxin (LANOXIN) 0.125 MG tablet Take 1 tablet (125 mcg total) by mouth daily. 90 tablet 1  . diphenoxylate-atropine (LOMOTIL) 2.5-0.025 MG per tablet Take 1 tablet by mouth 4 (four) times daily as needed for diarrhea or loose stools. 60 tablet 2  . furosemide (LASIX) 20 MG tablet Take 1 tablet (20 mg total) by mouth daily. 90 tablet 0  . levETIRAcetam (KEPPRA) 750 MG tablet Take 2 tablets (1,500 mg total) by mouth 2 (two) times daily. 360 tablet 3  . levothyroxine (SYNTHROID, LEVOTHROID) 50 MCG tablet Take 1 tablet (50 mcg total) by mouth every evening. 90 tablet 3  . lidocaine (XYLOCAINE) 2 % solution Take 20 mLs by mouth every 3 (three) hours as needed for mouth pain. Oral pain    . Mesalamine (ASACOL HD) 800 MG TBEC Take 2 tablets (1,600 mg total) by mouth 3 (three) times daily. NEEDS OFFICE VISIT FOR FURTHER REFILLS 540 tablet 0  . Multiple Vitamin (MULTIVITAMIN WITH MINERALS) TABS Take 1 tablet by mouth daily.    . pravastatin (PRAVACHOL) 40 MG tablet Take 1 tablet (40 mg total) by mouth every evening. 90  tablet 1  . saccharomyces boulardii (FLORASTOR) 250 MG capsule Take 2 capsules (500 mg total) by mouth 2 (two) times daily. 120 capsule 1  . PHENobarbital (LUMINAL) 97.2 MG tablet TAKE 1 TABLET BY MOUTH ONCE EVERY MORNING 90 tablet 1  . mupirocin ointment (BACTROBAN) 2 % Applied twice a day to the affected area;NOT into eyes. 22 g 0  . predniSONE (DELTASONE) 20 MG tablet Take 2 tablets (40 mg total) by mouth daily with breakfast. Or take as directed by your GI doctor. (Patient taking differently: Take 20 mg by mouth daily with breakfast. Or take as directed by your GI doctor.Tapering dose) 60 tablet 0  . predniSONE (DELTASONE) 5 MG tablet Take 1 tablet (5 mg total) by mouth daily with breakfast. 20 tablet 0  . vancomycin (VANCOCIN) 50 mg/mL oral solution 2.5 ml (0.5 tablespoon) qid x 7 days then bid x 7 days then qd x 7 days then qod x 7 days then every 3rd day for 14 days. 145 mL 0   No facility-administered medications prior to visit.    ROS Review of Systems  Constitutional: Negative.  Negative for fever, chills, diaphoresis, appetite change and fatigue.  HENT: Negative.   Eyes: Negative.   Respiratory: Negative.  Negative for cough, choking, chest tightness, shortness of breath and stridor.   Cardiovascular: Negative.  Negative for chest pain, palpitations  and leg swelling.  Gastrointestinal: Positive for diarrhea. Negative for nausea, vomiting, abdominal pain, constipation and blood in stool.  Endocrine: Negative.   Genitourinary: Negative.  Negative for dysuria, difficulty urinating and dyspareunia.  Musculoskeletal: Negative.   Skin: Negative.   Allergic/Immunologic: Negative.   Neurological: Negative.  Negative for dizziness.  Hematological: Negative.  Negative for adenopathy. Does not bruise/bleed easily.  Psychiatric/Behavioral: Negative.     Objective:  BP 126/72 mmHg  Pulse 84  Temp(Src) 98.6 F (37 C) (Oral)  Resp 20  Ht 5\' 4"  (1.626 m)  Wt 112 lb 12 oz (51.143 kg)   BMI 19.34 kg/m2  SpO2 94%  BP Readings from Last 3 Encounters:  06/08/15 126/72  03/30/15 94/60  03/11/15 122/72    Wt Readings from Last 3 Encounters:  06/08/15 112 lb 12 oz (51.143 kg)  03/30/15 110 lb 8 oz (50.122 kg)  03/11/15 122 lb (55.339 kg)    Physical Exam  Constitutional: She is oriented to person, place, and time. No distress.  HENT:  Mouth/Throat: Oropharynx is clear and moist. No oropharyngeal exudate.  Eyes: Conjunctivae are normal. Right eye exhibits no discharge. Left eye exhibits no discharge. No scleral icterus.  Neck: Normal range of motion. Neck supple. No JVD present. No tracheal deviation present. No thyromegaly present.  Cardiovascular: Normal rate, regular rhythm, normal heart sounds and intact distal pulses.  Exam reveals no gallop and no friction rub.   No murmur heard. Pulmonary/Chest: Effort normal and breath sounds normal. No stridor. No respiratory distress. She has no wheezes. She has no rales. She exhibits no tenderness.  Abdominal: Soft. Bowel sounds are normal. She exhibits no distension and no mass. There is no tenderness. There is no rebound and no guarding.  Musculoskeletal: Normal range of motion. She exhibits no edema or tenderness.  Lymphadenopathy:    She has no cervical adenopathy.  Neurological: She is oriented to person, place, and time.  Skin: Skin is warm and dry. No rash noted. She is not diaphoretic. No erythema. No pallor.    Lab Results  Component Value Date   WBC 12.4* 06/08/2015   HGB 12.0 06/08/2015   HCT 36.2 06/08/2015   PLT 248.0 06/08/2015   GLUCOSE 130* 06/08/2015   CHOL 131 02/16/2015   TRIG 83.0 02/16/2015   HDL 52.80 02/16/2015   LDLCALC 61 02/16/2015   ALT 16 02/26/2015   AST 22 02/26/2015   NA 130* 06/08/2015   K 4.5 06/08/2015   CL 92* 06/08/2015   CREATININE 0.61 06/08/2015   BUN 10 06/08/2015   CO2 33* 06/08/2015   TSH 1.23 06/08/2015   INR 1.24 01/07/2015   HGBA1C 6.0 06/08/2015    No results  found.  Assessment & Plan:   [name] was seen today for anemia, hypertension and hyperlipidemia.  Diagnoses and all orders for this visit:  Essential hypertension- her blood pressure is well-controlled, electrolytes and renal function are stable. -     CBC with Differential/Platelet; Future -     Basic metabolic panel; Future  Other specified hypothyroidism- her TSH is in the normal range, she will remain on the current dose of Synthroid. -     TSH; Future  Other abnormal glucose- she has prediabetes, no medications are needed at this time, she will continue to work on her lifestyle modifications. -     Hemoglobin A1c; Future -     Basic metabolic panel; Future  Seizure ([place])- she has not had any recent seizures, phenobarbital dose change  at the request of her insurance company. -     PHENObarbital (LUMINAL) 100 MG tablet; Take 1 tablet (100 mg total) by mouth at bedtime.  Anemia, iron deficiency- improvement noted and her iron level and her hemoglobin and hematocrit, will continue iron replacement therapy. -     IBC panel; Future -     CBC with Differential/Platelet; Future -     Ferritin; Future  Need for prophylactic vaccination against Streptococcus pneumoniae (pneumococcus) -     Pneumococcal polysaccharide vaccine 23-valent greater than or equal to 2yo subcutaneous/IM  I have discontinued [name]'s vancomycin, mupirocin ointment, and PHENobarbital. I am also having her start on PHENObarbital. Additionally, I am having her maintain her multivitamin with minerals, lidocaine, Calcium Carbonate-Vitamin D (CALCIUM 500 + D PO), aspirin, Mesalamine, levothyroxine, levETIRAcetam, pravastatin, digoxin, saccharomyces boulardii, diphenoxylate-atropine, furosemide, and predniSONE.  Meds ordered this encounter  Medications  . predniSONE (DELTASONE) 10 MG tablet    Sig: Take 10 mg by mouth 2 (two) times daily with a meal.  . PHENObarbital (LUMINAL) 100 MG tablet    Sig: Take 1  tablet (100 mg total) by mouth at bedtime.    Dispense:  90 tablet    Refill:  1     Follow-up: Return in about 6 months (around 12/07/2015).  Scarlette Calico, MD

## 2015-07-03 ENCOUNTER — Ambulatory Visit (INDEPENDENT_AMBULATORY_CARE_PROVIDER_SITE_OTHER): Payer: PPO | Admitting: Internal Medicine

## 2015-07-03 ENCOUNTER — Encounter: Payer: Self-pay | Admitting: Internal Medicine

## 2015-07-03 VITALS — BP 98/58 | HR 80 | Ht 64.0 in | Wt 115.1 lb

## 2015-07-03 DIAGNOSIS — Z8719 Personal history of other diseases of the digestive system: Secondary | ICD-10-CM

## 2015-07-03 DIAGNOSIS — Z862 Personal history of diseases of the blood and blood-forming organs and certain disorders involving the immune mechanism: Secondary | ICD-10-CM

## 2015-07-03 DIAGNOSIS — IMO0001 Reserved for inherently not codable concepts without codable children: Secondary | ICD-10-CM

## 2015-07-03 DIAGNOSIS — R159 Full incontinence of feces: Secondary | ICD-10-CM

## 2015-07-03 DIAGNOSIS — K5190 Ulcerative colitis, unspecified, without complications: Secondary | ICD-10-CM | POA: Diagnosis not present

## 2015-07-03 DIAGNOSIS — R152 Fecal urgency: Secondary | ICD-10-CM

## 2015-07-03 MED ORDER — MESALAMINE 1.2 G PO TBEC: 4.8000 g | DELAYED_RELEASE_TABLET | Freq: Every day | ORAL | Status: DC

## 2015-07-03 NOTE — Progress Notes (Signed)
Subjective:    Patient ID: [name], female    DOB: 1941-03-13, 74 y.o.   MRN: [id number]  HPI [name] is a 74 year old female with ulcerative colitis, history of C. difficile who seen for follow-up. She is here today with her husband. She was last seen in September 2016. She has been doing well recently though having issues with fecal urgency and at times incontinence. Incontinence seems to be worse in the evening near dinnertime. The rectal bleeding and diarrhea have stopped. Stools are soft and like putting. No abdominal pain. Good appetite. No nausea or vomiting. No fevers or chills. No recent falls.  She continues on mesalamine 4.8 g daily. She is using Lomotil on an as-needed basis but certainly not daily. She has remained on prednisone 10 mg. We tried to wean prednisone off entirely and when we did the rectal bleeding and diarrhea returned.  Improvement in lower extremity edema on furosemide daily.   Review of Systems As per history of present illness, otherwise negative  Current Medications, Allergies, Past Medical History, Past Surgical History, Family History and Social History were reviewed in [employer] record.     Objective:   Physical Exam BP 98/58 mmHg  Pulse 80  Ht 5\' 4"  (1.626 m)  Wt 115 lb 2 oz (52.22 kg)  BMI 19.75 kg/m2 Constitutional: Well-developed and well-nourished. No distress. HEENT: Normocephalic and atraumatic. Oropharynx is clear and moist. No oropharyngeal exudate. Conjunctivae are normal.  No scleral icterus. Neck: stoma in place Cardiovascular: Normal rate, regular rhythm and intact distal pulses. No M/R/G Pulmonary/chest: Effort normal and breath sounds normal. No wheezing, rales or rhonchi. Abdominal: Soft, nontender, nondistended. Bowel sounds active throughout. There are no masses palpable.  Extremities: no clubbing, cyanosis, trace pretib edema Neurological: Alert and oriented to person place and time. Skin:  Skin is warm and dry.  Psychiatric: Normal mood and affect. Behavior is normal.  CBC    Component Value Date/Time   WBC 12.4* 06/08/2015 1343   RBC 3.71* 06/08/2015 1343   RBC 2.91* 10/30/2011 0502   HGB 12.0 06/08/2015 1343   HCT 36.2 06/08/2015 1343   PLT 248.0 06/08/2015 1343   MCV 97.5 06/08/2015 1343   MCH 31.5 02/28/2015 0525   MCHC 33.2 06/08/2015 1343   RDW 17.4* 06/08/2015 1343   LYMPHSABS 2.1 06/08/2015 1343   MONOABS 0.7 06/08/2015 1343   EOSABS 0.2 06/08/2015 1343   BASOSABS 0.0 06/08/2015 1343   Iron/TIBC/Ferritin/ %Sat    Component Value Date/Time   IRON 122 06/08/2015 1343   TIBC Not calculated due to Iron <10. 10/30/2011 0502   FERRITIN 27.4 06/08/2015 1343   IRONPCTSAT 35.9 06/08/2015 1343       Assessment & Plan:   74 year old female with ulcerative colitis, history of C. difficile who seen for follow-up.   1. UC -- unfortunately we have been unable to wean prednisone off entirely. She is doing well on mesalamine 4.8 g daily and prednisone 10 mg daily. For now I'm going to continue both these medications at her current dose. For her fecal urgency I will have her add Benefiber 1-2 tablespoons daily and schedule Lomotil 1 tablet at 3 PM and then another one in early evening. If this fails to improve urgency and incontinence then can try cholestyramine. She already has cholestyramine at home. CBC has improved as of earlier this month with normalizing hemoglobin and iron studies. This is reassuring  2. IDA -- improved with improvement in colitis. Hemoglobin  and iron studies recently normal  3. C. Difficile -- history of. No issues currently  Follow-up May 2016, sooner if necessary 25 minutes spent with the patient today. Greater than 50% was spent in counseling and coordination of care with the patient

## 2015-07-03 NOTE — Patient Instructions (Signed)
Please continue Prednisone 10 mg daily.  Take Lomotil 1 tablet twice daily (3:00 pm and evening)  Please purchase the following medications over the counter and take as directed: Benefiber or Metamucil as directed on bottle (helps bulk the stool)  We have given you a written prescription for Lialda 1.2 g 4 tablets daily to send to [facility] as per your wishes.  Please follow up with Dr Hilarie Fredrickson in May.

## 2015-07-04 ENCOUNTER — Other Ambulatory Visit: Payer: Self-pay | Admitting: Internal Medicine

## 2015-07-13 DIAGNOSIS — Z9002 Acquired absence of larynx: Secondary | ICD-10-CM | POA: Diagnosis not present

## 2015-07-13 DIAGNOSIS — Z08 Encounter for follow-up examination after completed treatment for malignant neoplasm: Secondary | ICD-10-CM | POA: Diagnosis not present

## 2015-07-13 DIAGNOSIS — Z8521 Personal history of malignant neoplasm of larynx: Secondary | ICD-10-CM | POA: Diagnosis not present

## 2015-07-13 DIAGNOSIS — C029 Malignant neoplasm of tongue, unspecified: Secondary | ICD-10-CM | POA: Diagnosis not present

## 2015-07-13 DIAGNOSIS — M438X4 Other specified deforming dorsopathies, thoracic region: Secondary | ICD-10-CM | POA: Diagnosis not present

## 2015-07-13 DIAGNOSIS — C329 Malignant neoplasm of larynx, unspecified: Secondary | ICD-10-CM | POA: Diagnosis not present

## 2015-07-18 ENCOUNTER — Other Ambulatory Visit: Payer: Self-pay | Admitting: Internal Medicine

## 2015-07-20 NOTE — Telephone Encounter (Signed)
fxd

## 2015-08-03 ENCOUNTER — Other Ambulatory Visit: Payer: Self-pay | Admitting: Internal Medicine

## 2015-08-07 ENCOUNTER — Other Ambulatory Visit: Payer: Self-pay | Admitting: Internal Medicine

## 2015-08-10 ENCOUNTER — Telehealth: Payer: Self-pay | Admitting: *Deleted

## 2015-08-10 ENCOUNTER — Other Ambulatory Visit: Payer: Self-pay | Admitting: Internal Medicine

## 2015-08-10 MED ORDER — PREDNISONE 10 MG PO TABS: 10.0000 mg | ORAL_TABLET | Freq: Every day | ORAL | Status: DC

## 2015-08-10 NOTE — Telephone Encounter (Signed)
Rx sent 

## 2015-09-04 ENCOUNTER — Other Ambulatory Visit: Payer: Self-pay | Admitting: Internal Medicine

## 2015-09-16 ENCOUNTER — Encounter: Payer: Self-pay | Admitting: Internal Medicine

## 2015-09-16 ENCOUNTER — Ambulatory Visit (INDEPENDENT_AMBULATORY_CARE_PROVIDER_SITE_OTHER): Payer: PPO | Admitting: Internal Medicine

## 2015-09-16 VITALS — BP 110/60 | HR 76 | Temp 98.2°F | Resp 16 | Ht 64.0 in | Wt 120.0 lb

## 2015-09-16 DIAGNOSIS — S91209A Unspecified open wound of unspecified toe(s) with damage to nail, initial encounter: Secondary | ICD-10-CM

## 2015-09-16 NOTE — Patient Instructions (Signed)
Fingernail or Toenail Removal Fingernail or toenail removal is a surgical procedure to take off a nail from your finger or your toe. You may need to have a fingernail or toenail removed if it has an abnormal shape (deformity) or if it is severely injured. A fingernail or toenail may also be removed due to a bacterial infection, a severe ingrown toenail, or a fungal infection that has failed treatment with antifungal medicines. LET [facility] CARE PROVIDER KNOW ABOUT:  Any allergies you have.  All medicines you are taking, including vitamins, herbs, eye drops, creams, and over-the-counter medicines.  Previous problems you or members of your family have had with the use of anesthetics.  Any blood disorders you have.  Previous surgeries you have had.  Any medical conditions you may have. RISKS AND COMPLICATIONS Generally, this is a safe procedure. However, problems may occur, including:  Pain.  Bleeding.  Infection.  Regrowth of a deformed nail. BEFORE THE PROCEDURE  Ask your health care provider about changing or stopping your regular medicines. This is especially important if you are taking diabetes medicines or blood thinners.  Follow instructions from your health care provider about eating or drinking restrictions.  Plan to have someone take you home after the procedure. PROCEDURE  An IV tube will be inserted into one of your veins.  You will be given one or more of the following:  A medicine that helps you relax (sedative).  A medicine that numbs the area (local anesthetic).  After your toe or finger is numb, your health care provider will insert a blunt instrument under your nail to lift it up.  In some cases, your health care provider may also make a cut (incision) in your nail.  After your nail is lifted away from your toe or finger, your health care provider will detach it from your nail bed.  A germ-killing bandage (antiseptic dressing) will be put on your toe  or finger. The procedure may vary among health care providers and hospitals. AFTER THE PROCEDURE  Your blood pressure, heart rate, breathing rate, and blood oxygen level will be monitored often until the medicines you were given have worn off.  It is common to have some pain after nail removal. You will be given pain medicine as needed.  You may be given a prescription for pain medicine and antibiotic medicine.  If you had a toenail removed, you will be given a surgical shoe to wear while you recover.  If you had a fingernail removed, you may be given a finger splint to wear while you recover.   This information is not intended to replace advice given to you by your health care provider. Make sure you discuss any questions you have with your health care provider.   Document Released: 03/19/2003 Document Revised: 11/04/2014 Document Reviewed: 06/18/2014 Elsevier Interactive Patient Education [employer].

## 2015-09-16 NOTE — Progress Notes (Signed)
Pre visit review using our clinic review tool, if applicable. No additional management support is needed unless otherwise documented below in the visit note. 

## 2015-09-18 ENCOUNTER — Encounter: Payer: Self-pay | Admitting: Internal Medicine

## 2015-09-18 NOTE — Progress Notes (Signed)
Subjective:  Patient ID: [name], female    DOB: Aug 04, 1940  Age: 75 y.o. MRN: [id number]  CC: Nail Problem   HPI [name] presents for the complaint of left great toenail pain for 3 days. Somehow the toenail got lifted up a few days ago and has been bothering her. There has not been any bleeding, swelling, drainage.  Outpatient Prescriptions Prior to Visit  Medication Sig Dispense Refill  . aspirin 81 MG tablet Take 81 mg by mouth daily.    . Calcium Carbonate-Vitamin D (CALCIUM 500 + D PO) Take 1 tablet by mouth daily.    . digoxin (LANOXIN) 0.125 MG tablet Take 1 tablet (125 mcg total) by mouth daily. 90 tablet 1  . diphenoxylate-atropine (LOMOTIL) 2.5-0.025 MG tablet TAKE 1 TABLET BY MOUTH 4 TIMES DAILY AS NEEDED FOR DIARRHEA OR LOOSE STOOLS 60 tablet 2  . furosemide (LASIX) 20 MG tablet TAKE 1 TABLET EVERY DAY 90 tablet 3  . levETIRAcetam (KEPPRA) 750 MG tablet Take 2 tablets (1,500 mg total) by mouth 2 (two) times daily. 360 tablet 3  . levothyroxine (SYNTHROID, LEVOTHROID) 50 MCG tablet TAKE 1 TABLET BY MOUTH ONCE EVERY EVENING 90 tablet 1  . lidocaine (XYLOCAINE) 2 % solution Take 20 mLs by mouth every 3 (three) hours as needed for mouth pain. Oral pain    . mesalamine (LIALDA) 1.2 g EC tablet Take 4 tablets (4.8 g total) by mouth daily with breakfast. May give generic mesalamine 360 tablet 1  . Multiple Vitamin (MULTIVITAMIN WITH MINERALS) TABS Take 1 tablet by mouth daily.    [facility] PHENObarbital (LUMINAL) 100 MG tablet Take 1 tablet (100 mg total) by mouth at bedtime. 90 tablet 1  . pravastatin (PRAVACHOL) 40 MG tablet TAKE 1 TABLET (40 MG TOTAL) BY MOUTH EVERY EVENING. 90 tablet 1  . predniSONE (DELTASONE) 10 MG tablet Take 1 tablet (10 mg total) by mouth daily. 30 tablet 2   No facility-administered medications prior to visit.    ROS Review of Systems  All other systems reviewed and are negative.   Objective:  BP 110/60 mmHg  Pulse 100  Temp(Src) 98.2  F (36.8 C) (Oral)  Ht 5\' 4"  (1.626 m)  Wt 120 lb (54.432 kg)  BMI 20.59 kg/m2  SpO2 96%  BP Readings from Last 3 Encounters:  09/16/15 110/60  07/03/15 98/58  06/08/15 126/72    Wt Readings from Last 3 Encounters:  09/16/15 120 lb (54.432 kg)  07/03/15 115 lb 2 oz (52.22 kg)  06/08/15 112 lb 12 oz (51.143 kg)    Physical Exam  Constitutional: She is oriented to person, place, and time. No distress.  HENT:  Mouth/Throat: No oropharyngeal exudate.  Eyes: Conjunctivae are normal. Right eye exhibits no discharge. Left eye exhibits no discharge. No scleral icterus.  Neck: Normal range of motion. Neck supple. No JVD present. No tracheal deviation present. No thyromegaly present.  Cardiovascular: An irregularly irregular rhythm present. Exam reveals no gallop.   No murmur heard. Pulmonary/Chest: Effort normal and breath sounds normal. No stridor. No respiratory distress. She has no wheezes. She has no rales. She exhibits no tenderness.  Abdominal: Soft. Bowel sounds are normal. She exhibits no distension and no mass. There is no tenderness. There is no rebound and no guarding.  Musculoskeletal: Normal range of motion. She exhibits no edema or tenderness.  Lymphadenopathy:    She has no cervical adenopathy.  Neurological: She is oriented to person, place, and time.  Skin:  Skin is warm and dry. No rash noted. She is not diaphoretic. No erythema. No pallor.  Her left great toenail has been lifted up. Underneath that there is subungual debris. The toenail is still intact and attached at the base. There is no tenderness, exudate, swelling, warmth, or streaking.    Lab Results  Component Value Date   WBC 12.4* 06/08/2015   HGB 12.0 06/08/2015   HCT 36.2 06/08/2015   PLT 248.0 06/08/2015   GLUCOSE 130* 06/08/2015   CHOL 131 02/16/2015   TRIG 83.0 02/16/2015   HDL 52.80 02/16/2015   LDLCALC 61 02/16/2015   ALT 16 02/26/2015   AST 22 02/26/2015   NA 130* 06/08/2015   K 4.5  06/08/2015   CL 92* 06/08/2015   CREATININE 0.61 06/08/2015   BUN 10 06/08/2015   CO2 33* 06/08/2015   TSH 1.23 06/08/2015   INR 1.24 01/07/2015   HGBA1C 6.0 06/08/2015    No results found.  Assessment & Plan:   [name] was seen today for nail problem.  Diagnoses and all orders for this visit:  Toenail avulsion, initial encounter- there is no evidence of bacterial infection, she does appear to have a significant toenail fungus infection and a slight avulsion of the toenail, I anticipate that she may need to have the toenail removed so I have referred her to podiatry. -     Ambulatory referral to Podiatry   I am having [name] maintain her multivitamin with minerals, lidocaine, Calcium Carbonate-Vitamin D (CALCIUM 500 + D PO), aspirin, levETIRAcetam, digoxin, PHENObarbital, mesalamine, levothyroxine, pravastatin, diphenoxylate-atropine, predniSONE, and furosemide.  No orders of the defined types were placed in this encounter.     Follow-up: Return if symptoms worsen or fail to improve.  [name] Calico, MD

## 2015-09-24 DIAGNOSIS — L603 Nail dystrophy: Secondary | ICD-10-CM | POA: Diagnosis not present

## 2015-10-05 ENCOUNTER — Telehealth: Payer: Self-pay | Admitting: Internal Medicine

## 2015-10-05 MED ORDER — MESALAMINE 800 MG PO TBEC: 4800.0000 mg | DELAYED_RELEASE_TABLET | Freq: Every day | ORAL | Status: DC

## 2015-10-05 NOTE — Telephone Encounter (Signed)
Patient's husband states that we gave them Lialda at last visit since we had samples at that time. However, they typically get mesalamine through [facility]. For some reason, [facility] will not take a [employer]. Per Dr Hilarie Fredrickson, patient may have rx for Asacol HD 4800 grams daily again. Rx created, signed by Dr Hilarie Fredrickson and faxed to [facility] at patient's request. Patient will also come by to pick up copy of original script for their records.

## 2015-10-05 NOTE — Telephone Encounter (Signed)
Left voicemail for patient to call back. 

## 2015-10-08 ENCOUNTER — Other Ambulatory Visit: Payer: Self-pay | Admitting: Internal Medicine

## 2015-10-12 NOTE — Telephone Encounter (Signed)
pls advise if rf is appropriate

## 2015-10-19 ENCOUNTER — Ambulatory Visit (INDEPENDENT_AMBULATORY_CARE_PROVIDER_SITE_OTHER): Payer: PPO | Admitting: Neurology

## 2015-10-19 ENCOUNTER — Ambulatory Visit: Payer: PPO | Admitting: Neurology

## 2015-10-19 ENCOUNTER — Encounter: Payer: Self-pay | Admitting: Neurology

## 2015-10-19 VITALS — BP 145/76 | HR 77 | Ht 64.0 in | Wt 120.0 lb

## 2015-10-19 DIAGNOSIS — R569 Unspecified convulsions: Secondary | ICD-10-CM

## 2015-10-19 MED ORDER — PHENOBARBITAL 100 MG PO TABS: 100.0000 mg | ORAL_TABLET | Freq: Every day | ORAL | Status: DC

## 2015-10-19 MED ORDER — LEVETIRACETAM 750 MG PO TABS
1500.0000 mg | ORAL_TABLET | Freq: Two times a day (BID) | ORAL | Status: DC
Start: 2015-10-19 — End: 2016-11-16

## 2015-10-19 NOTE — Progress Notes (Signed)
Reason for visit: Seizures  [name] is an 75 y.o. female  History of present illness:  [name] is a 75 year old left-handed white female with a history of a seizure disorder, she has a history of a prior subdural hematoma. The patient has been on phenobarbital and Keppra, she has done well with this. She has a chronic gait disorder, but she has had no recent falls. She does not use a cane or a walker for ambulation. The patient has not had any significant new medical issues that have come up since last seen. She returns to this office for an evaluation.  Past Medical History  Diagnosis Date  . Peripheral vascular disease ([place])   . Subdural hematoma ([place]) 1990s    s/p fall in bathtub  . Mild dysplasia of cervix   . UTI (lower urinary tract infection)   . Urinary incontinence   . Cerebrovascular disease 2000  . Personal history of colonic polyps     adenomatous 1997 & tubular adenoma and hyplastic  2008  . Hyperlipidemia     takes Pravastatin daily  . H/O alcohol abuse   . Diverticulosis of colon (without mention of hemorrhage)   . Redundant colon   . C. difficile colitis   . Ulcerative colitis     takes [place] daily  . Permanent atrial fibrillation ([place])   . Seizures ([place])     takes Phenobarbital and Keppra daily;last seizure was April 1,2014  . Stroke ([place]) 2000    off balance on right side a little and peripheral vision loss on right side  . Arthritis   . Joint pain   . Joint swelling   . History of UTI 10-2012  . Hypothyroidism     takes Levothyroxine daily  . 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 Anxiety   . Dementia   . Cancer of larynx ([place])   . Hypertension   . Gait disorder   . 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 Pneumonia 2/16  . Cellulitis     Past Surgical History  Procedure Laterality Date  . Subdural hematoma evacuation via craniotomy    . Oral surgery for squamous cell carcinoma of the  mouth      x 3  . Multiple tooth extractions      due to oral cancer  . Cataract extraction, bilateral    . Breast lumpectomy      left breast with radiation therapy  . Carotid endarterectomy      left  . Mastectomy      Right, history with nodule dissection  . Orif hip fracture  Sept '12    Right hip: screw and plate repair.  . Larynx surgery  08/2010    [facility] x 2  . Tubal ligation    . Colonoscopy    . Orif humerus fracture Right 10/30/2012    Procedure: OPEN REDUCTION INTERNAL FIXATION (ORIF) HUMERAL SHAFT FRACTURE;  Surgeon: Hessie Dibble, MD;  Location: [facility];  Service: Orthopedics;  Laterality: Right;  BIG C-ARM, SHOULDER POSITION  . Hammer toe surgery Right 03/2013    pins and screws  . Flexible sigmoidoscopy N/A 06/21/2013    Procedure: FLEXIBLE SIGMOIDOSCOPY;  Surgeon: Jerene Bears, MD;  Location: [facility];  Service: Gastroenterology;  Laterality: N/A;  . Hardware removal Right 11/14/2013    Procedure: RIGHT SHOUDER HARDWARE REMOVAL;  Surgeon: Nita Sells, MD;  Location:  [facility];  Service: Orthopedics;  Laterality: Right;  . Incision and drainage Right 11/14/2013    Procedure: INCISION AND DRAINAGE;  Surgeon: Nita Sells, MD;  Location: [facility];  Service: Orthopedics;  Laterality: Right;  . Shoulder surgery  10/2013    Family History  Problem Relation Age of Onset  . Coronary artery disease Mother   . Heart disease Mother   . Diabetes Sister   . Breast cancer Maternal Aunt   . Breast cancer Sister   . Colon cancer      nephew (sister's son)    Social history:  reports that she quit smoking about 10 years ago. She has never used smokeless tobacco. She reports that she drinks alcohol. She reports that she does not use illicit drugs.    Allergies  Allergen Reactions  . Xarelto [Rivaroxaban]     Suffered a severe bleed  . Diltiazem Other (See Comments)    edema    Medications:  Prior to Admission medications   Medication Sig Start Date  End Date Taking? Authorizing Provider  aspirin 81 MG tablet Take 81 mg by mouth daily.   Yes Historical Provider, MD  Calcium Carbonate-Vitamin D (CALCIUM 500 + D PO) Take 1 tablet by mouth daily.   Yes Historical Provider, MD  digoxin (LANOXIN) 0.125 MG tablet TAKE 1 TABLET (125 MCG TOTAL) BY MOUTH DAILY. 10/12/15  Yes Janith Lima, MD  diphenoxylate-atropine (LOMOTIL) 2.5-0.025 MG tablet TAKE 1 TABLET BY MOUTH 4 TIMES DAILY AS NEEDED FOR DIARRHEA OR LOOSE STOOLS 07/19/15  Yes Janith Lima, MD  furosemide (LASIX) 20 MG tablet TAKE 1 TABLET EVERY DAY 09/04/15  Yes Janith Lima, MD  levETIRAcetam (KEPPRA) 750 MG tablet Take 2 tablets (1,500 mg total) by mouth 2 (two) times daily. 10/22/14  Yes Kathrynn Ducking, MD  levothyroxine (SYNTHROID, LEVOTHROID) 50 MCG tablet TAKE 1 TABLET BY MOUTH ONCE EVERY EVENING 07/06/15  Yes Janith Lima, MD  lidocaine (XYLOCAINE) 2 % solution Take 20 mLs by mouth every 3 (three) hours as needed for mouth pain. Oral pain   Yes Historical Provider, MD  Mesalamine 800 MG TBEC Take 6 tablets (4,800 mg total) by mouth daily. 10/05/15  Yes Jerene Bears, MD  Multiple Vitamin (MULTIVITAMIN WITH MINERALS) TABS Take 1 tablet by mouth daily.   Yes Historical Provider, MD  PHENObarbital (LUMINAL) 100 MG tablet Take 1 tablet (100 mg total) by mouth at bedtime. 06/08/15  Yes Janith Lima, MD  pravastatin (PRAVACHOL) 40 MG tablet TAKE 1 TABLET (40 MG TOTAL) BY MOUTH EVERY EVENING. 07/06/15  Yes Janith Lima, MD  predniSONE (DELTASONE) 10 MG tablet Take 1 tablet (10 mg total) by mouth daily. 08/10/15  Yes Jerene Bears, MD  vitamin C (ASCORBIC ACID) 500 MG tablet Take 500 mg by mouth daily.   Yes Historical Provider, MD    ROS:  Out of a complete 14 system review of symptoms, the patient complains only of the following symptoms, and all other reviewed systems are negative.  Balance difficulty Arthritis  Blood pressure 145/76, pulse 77, height 5\' 4"  (1.626 m), weight 120 lb (54.432  kg).  Physical Exam  General: The patient is alert and cooperative at the time of the examination.  Skin: 1+ edema at the ankles is noted.   Neurologic Exam  Mental status: The patient is alert and oriented x 3 at the time of the examination. The patient has apparent normal recent and remote memory, with  an apparently normal attention span and concentration ability.   Cranial nerves: Facial symmetry is present. Speech is generated with an electrolarynx. Extraocular movements are full. Visual fields are full.  Motor: The patient has good strength in all 4 extremities.  Sensory examination: Soft touch sensation is symmetric on the face, arms, and legs.  Coordination: The patient has good finger-nose-finger and heel-to-shin bilaterally.  Gait and station: The patient has a wide-based gait. Tandem gait was not attempted. Romberg is positive, the patient falls backwards. No drift is seen.  Reflexes: Deep tendon reflexes are symmetric.   Assessment/Plan:  1. Seizure disorder, well controlled  2. Gait disorder  The patient will remain on Keppra and phenobarbital, prescriptions were given for these medications. The patient will follow-up in one year for evaluation, sooner if needed. The patient has gotten blood work done through her primary care physician in December 2016.  Jill Alexanders MD 10/19/2015 8:15 PM  [facility] [address] [place], [place] [address]  Phone [phone] Fax [phone]

## 2015-10-21 ENCOUNTER — Telehealth: Payer: Self-pay | Admitting: Internal Medicine

## 2015-10-21 ENCOUNTER — Telehealth: Payer: Self-pay

## 2015-10-21 ENCOUNTER — Other Ambulatory Visit: Payer: Self-pay

## 2015-10-21 DIAGNOSIS — R569 Unspecified convulsions: Secondary | ICD-10-CM

## 2015-10-21 NOTE — Telephone Encounter (Signed)
Called and spoke to pt's husband, Marden Noble. Inquired if pt would like to pick up rx for phenobarbital or if she would like faxed to pharmacy. Agreed to faxing rx to [facility]. Faxed as requested.

## 2015-10-22 MED ORDER — DIPHENOXYLATE-ATROPINE 2.5-0.025 MG PO TABS: ORAL_TABLET | ORAL | Status: DC

## 2015-10-22 NOTE — Telephone Encounter (Signed)
Rx sent. Patient needs office visit for further refills. 

## 2015-10-30 ENCOUNTER — Telehealth: Payer: Self-pay | Admitting: Internal Medicine

## 2015-10-30 ENCOUNTER — Other Ambulatory Visit: Payer: Self-pay | Admitting: Internal Medicine

## 2015-10-30 MED ORDER — PREDNISONE 10 MG PO TABS: 10.0000 mg | ORAL_TABLET | Freq: Every day | ORAL | Status: DC

## 2015-10-30 NOTE — Telephone Encounter (Signed)
I have spoken to [name] and offered an appointment for 11/17/15. He states that they cannot come on that date. I advised that we will have to wait for July's schedule to come out to get her scheduled for a different day.

## 2015-10-30 NOTE — Telephone Encounter (Signed)
Pt calling you back

## 2015-10-30 NOTE — Telephone Encounter (Signed)
Patient is rescheduled for 11/17/15. I will send prednisone refill.

## 2015-11-17 ENCOUNTER — Ambulatory Visit (INDEPENDENT_AMBULATORY_CARE_PROVIDER_SITE_OTHER): Payer: PPO | Admitting: Internal Medicine

## 2015-11-17 ENCOUNTER — Ambulatory Visit: Payer: PPO | Admitting: Internal Medicine

## 2015-11-17 ENCOUNTER — Ambulatory Visit (INDEPENDENT_AMBULATORY_CARE_PROVIDER_SITE_OTHER)
Admission: RE | Admit: 2015-11-17 | Discharge: 2015-11-17 | Disposition: A | Discharge status: Home / Self Care | Payer: PPO | Source: Ambulatory Visit | Attending: Internal Medicine | Admitting: Internal Medicine

## 2015-11-17 ENCOUNTER — Other Ambulatory Visit (INDEPENDENT_AMBULATORY_CARE_PROVIDER_SITE_OTHER): Payer: PPO

## 2015-11-17 ENCOUNTER — Telehealth: Payer: Self-pay | Admitting: *Deleted

## 2015-11-17 ENCOUNTER — Encounter: Payer: Self-pay | Admitting: Internal Medicine

## 2015-11-17 VITALS — BP 106/60 | HR 68 | Ht 64.0 in | Wt 120.0 lb

## 2015-11-17 DIAGNOSIS — W19XXXA Unspecified fall, initial encounter: Secondary | ICD-10-CM

## 2015-11-17 DIAGNOSIS — K5190 Ulcerative colitis, unspecified, without complications: Secondary | ICD-10-CM

## 2015-11-17 DIAGNOSIS — B370 Candidal stomatitis: Secondary | ICD-10-CM | POA: Diagnosis not present

## 2015-11-17 DIAGNOSIS — R0602 Shortness of breath: Secondary | ICD-10-CM | POA: Diagnosis not present

## 2015-11-17 DIAGNOSIS — S2231XA Fracture of one rib, right side, initial encounter for closed fracture: Secondary | ICD-10-CM | POA: Diagnosis not present

## 2015-11-17 LAB — CBC WITH DIFFERENTIAL/PLATELET
BASOS ABS: 0 10*3/uL (ref 0.0–0.1)
Basophils Relative: 0.1 % (ref 0.0–3.0)
EOS ABS: 0 10*3/uL (ref 0.0–0.7)
EOS PCT: 0.3 % (ref 0.0–5.0)
HCT: 39.5 % (ref 36.0–46.0)
HEMOGLOBIN: 13.3 g/dL (ref 12.0–15.0)
LYMPHS ABS: 2.2 10*3/uL (ref 0.7–4.0)
Lymphocytes Relative: 14.9 % (ref 12.0–46.0)
MCHC: 33.7 g/dL (ref 30.0–36.0)
MCV: 100.5 fl — ABNORMAL HIGH (ref 78.0–100.0)
MONO ABS: 0.7 10*3/uL (ref 0.1–1.0)
Monocytes Relative: 4.4 % (ref 3.0–12.0)
NEUTROS PCT: 80.3 % — AB (ref 43.0–77.0)
Neutro Abs: 11.9 10*3/uL — ABNORMAL HIGH (ref 1.4–7.7)
Platelets: 198 10*3/uL (ref 150.0–400.0)
RBC: 3.93 Mil/uL (ref 3.87–5.11)
RDW: 16.2 % — ABNORMAL HIGH (ref 11.5–15.5)
WBC: 14.9 10*3/uL — AB (ref 4.0–10.5)

## 2015-11-17 LAB — COMPREHENSIVE METABOLIC PANEL
ALBUMIN: 4.7 g/dL (ref 3.5–5.2)
ALK PHOS: 86 U/L (ref 39–117)
ALT: 13 U/L (ref 0–35)
AST: 16 U/L (ref 0–37)
BUN: 21 mg/dL (ref 6–23)
CO2: 34 mEq/L — ABNORMAL HIGH (ref 19–32)
CREATININE: 0.75 mg/dL (ref 0.40–1.20)
Calcium: 10 mg/dL (ref 8.4–10.5)
Chloride: 92 mEq/L — ABNORMAL LOW (ref 96–112)
GFR: 79.56 mL/min (ref >60.00)
GLUCOSE: 115 mg/dL — AB (ref 70–99)
POTASSIUM: 4.2 meq/L (ref 3.5–5.1)
SODIUM: 132 meq/L — AB (ref 135–145)
TOTAL PROTEIN: 8 g/dL (ref 6.0–8.3)
Total Bilirubin: 0.8 mg/dL (ref 0.2–1.2)

## 2015-11-17 MED ORDER — FLUCONAZOLE 150 MG PO TABS: 150.0000 mg | ORAL_TABLET | Freq: Every day | ORAL | Status: DC

## 2015-11-17 MED ORDER — MESALAMINE 800 MG PO TBEC: 4800.0000 mg | DELAYED_RELEASE_TABLET | Freq: Every day | ORAL | Status: DC

## 2015-11-17 MED ORDER — DIPHENOXYLATE-ATROPINE 2.5-0.025 MG PO TABS: ORAL_TABLET | ORAL | Status: DC

## 2015-11-17 NOTE — Progress Notes (Signed)
Subjective:    Patient ID: [name], female    DOB: 11/09/1940, 75 y.o.   MRN: [id number]  HPI [name] is a 75 year old female with ulcerative colitis, history of C. difficile colitis who is here for follow-up. She's here today with her husband. She was last seen in December 2016. She has had issues with ongoing intermittent flares of her ulcerative colitis manifested by rectal bleeding and diarrhea. Currently she is using mesalamine 4.8 g a day, Lomotil twice a day, prednisone 10 mg daily and Benefiber twice daily. From a colitis standpoint symptoms of been well controlled. She is having soft but mostly formed brown stool without rectal bleeding, hematochezia or melena. She has not had any abdominal pain, tenesmus or rectal pain. No recent fever or chills. She has been treated for thrush which was noted after antibiotics and a dental extraction. She was given nystatin which did cause diarrhea for several days with each use. No further diarrhea after stopping the nystatin.  Unfortunately on Saturday, 3 days ago, she had a fall when walking from her bedroom to the kitchen. Her husband had stepped across the street to visit with a neighbor. When he returned she was sitting in a chair but he noticed that the television and TV stand had been pushed into the wall. Since this time she's been complaining of right posterior chest wall pain and he has noticed her breathing to be "heavy". She's also had a mild nonproductive cough. No fever or chills. She denies dyspnea to me today. She has been taking Tylenol at over-the-counter doses which she states she only does when she is hurting.   Review of Systems As per history of present illness, otherwise negative  Current Medications, Allergies, Past Medical History, Past Surgical History, Family History and Social History were reviewed in [employer] record.     Objective:   Physical Exam BP 106/60 mmHg  Pulse 68  Ht 5\' 4"   (1.626 m)  Wt 120 lb (54.432 kg)  BMI 20.59 kg/m2 Constitutional: Chronically ill-appearing female in no acute distress  HEENT: Normocephalic and atraumatic.  Oral thrush noted on her tongue. Conjunctivae are normal.  No scleral icterus. Neck: Prior surgery which is well healed, stoma in place Cardiovascular: Normal rate, regular rhythm and intact distal pulses. No M/R/G Pulmonary/chest:  Minimally increased effort of breathing with decreased breath sounds in the bilateral bases possibly effort related, tenderness in the right lateral chest wall and right posterior chest wall to palpation. Abdominal: Soft, nontender, nondistended. Bowel sounds active throughout.  Extremities: no clubbing, cyanosis, trace pretibial edema  Neurological: Alert and oriented to person place and time. Skin: Skin is warm and dry.  Psychiatric: Normal mood and affect. Behavior is normal.  CXR -- sixth right rib fracture, small pneumothorax  CBC    Component Value Date/Time   WBC 14.9* 11/17/2015 1522   RBC 3.93 11/17/2015 1522   RBC 2.91* 10/30/2011 0502   HGB 13.3 11/17/2015 1522   HCT 39.5 11/17/2015 1522   PLT 198.0 11/17/2015 1522   MCV 100.5* 11/17/2015 1522   MCH 31.5 02/28/2015 0525   MCHC 33.7 11/17/2015 1522   RDW 16.2* 11/17/2015 1522   LYMPHSABS 2.2 11/17/2015 1522   MONOABS 0.7 11/17/2015 1522   EOSABS 0.0 11/17/2015 1522   BASOSABS 0.0 11/17/2015 1522    CMP     Component Value Date/Time   NA 132* 11/17/2015 1522   K 4.2 11/17/2015 1522   CL 92* 11/17/2015  1522   CO2 34* 11/17/2015 1522   GLUCOSE 115* 11/17/2015 1522   BUN 21 11/17/2015 1522   CREATININE 0.75 11/17/2015 1522   CALCIUM 10.0 11/17/2015 1522   PROT 8.0 11/17/2015 1522   ALBUMIN 4.7 11/17/2015 1522   AST 16 11/17/2015 1522   ALT 13 11/17/2015 1522   ALKPHOS 86 11/17/2015 1522   BILITOT 0.8 11/17/2015 1522   GFRNONAA >60 02/28/2015 0525   GFRAA >60 02/28/2015 0525        Assessment & Plan:  75 year old  female with ulcerative colitis, history of C. difficile colitis who is here for follow-up.   1. UC -- She is currently doing well from a colitis standpoint. I'm going to try to decrease prednisone to 5 mg daily. Continue mesalamine at 4.8 g daily in divided doses. Continue Lomotil 1 tablet twice a day. Continue Benefiber 1 tablespoon twice a day. Call if symptoms of urgency, diarrhea, or rectal bleeding returned as we slowly attempt to taper off steroids. Hemoglobin is normal which is reassuring  2. IDA -- improved with improving colitis. Hemoglobin recently normal  3. Fall/right rib fracture/small pneumothorax -- discussed with Dr. Roselie Awkward with pulmonology. He has graciously agreed to try to work the patient in for an urgent office visit tomorrow. Likely this will be followed without the need for intervention. We called the patient's husband and instructed them to go to the ER immediately for any worsening chest pain, shortness of breath, fever, chills. He verbalized understanding  4. Oral thrush -- Diflucan 150 mg daily 7 days  6 month follow-up, sooner as needed 25 minutes spent with the patient today. Greater than 50% was spent in counseling and coordination of care with the patient

## 2015-11-17 NOTE — Patient Instructions (Signed)
Your provider has requested that you have a chest/rib x ray before leaving today. Please go to the basement floor to our Radiology department for the test.  Decrease your prednisone to 5 mg daily.  We have sent the following medications to your pharmacy for you to pick up at your convenience: Diflucan 150 mg daily x 7 days Lomotil  We have given you a prescription for Mesalamine.  Your physician has requested that you go to the basement for the following lab work before leaving today: CBC, CMP  If you are age 9 or older, your body mass index should be between 23-30. Your Body mass index is 20.59 kg/(m^2). If this is out of the aforementioned range listed, please consider follow up with your Primary Care Provider.  If you are age 53 or younger, your body mass index should be between 19-25. Your Body mass index is 20.59 kg/(m^2). If this is out of the aformentioned range listed, please consider follow up with your Primary Care Provider.

## 2015-11-17 NOTE — Telephone Encounter (Signed)
Per Dr Vena Rua verbal instruction, I contacted patient's husband (patient is unable to speak) to advise that chest xrays from today show a 6th rib fracture as well as small pleural effusion and pneumothorax. I explained that Dr Hilarie Fredrickson has spoken to pulmonologist, Dr Lake Bells who thinks it will be okay for patient to hold off on emergency care right now and see him in the office tomorrow. However, Mr.Musselman is advised that should patient have worsening symptoms, specifically worsening dyspnea, she is to go to the emergency room immediately. Mr.Ratliff verbalizes understanding and will await a call from Dr Anastasia Pall office tomorrow.

## 2015-11-18 ENCOUNTER — Ambulatory Visit (INDEPENDENT_AMBULATORY_CARE_PROVIDER_SITE_OTHER)
Admission: RE | Admit: 2015-11-18 | Discharge: 2015-11-18 | Disposition: A | Discharge status: Home / Self Care | Payer: PPO | Source: Ambulatory Visit | Attending: Pulmonary Disease | Admitting: Pulmonary Disease

## 2015-11-18 ENCOUNTER — Ambulatory Visit (INDEPENDENT_AMBULATORY_CARE_PROVIDER_SITE_OTHER): Payer: PPO | Admitting: Pulmonary Disease

## 2015-11-18 ENCOUNTER — Encounter: Payer: Self-pay | Admitting: Pulmonary Disease

## 2015-11-18 ENCOUNTER — Other Ambulatory Visit: Payer: Self-pay

## 2015-11-18 VITALS — BP 118/58 | HR 83 | Ht 64.0 in | Wt 120.0 lb

## 2015-11-18 DIAGNOSIS — S270XXA Traumatic pneumothorax, initial encounter: Secondary | ICD-10-CM | POA: Insufficient documentation

## 2015-11-18 DIAGNOSIS — J42 Unspecified chronic bronchitis: Secondary | ICD-10-CM | POA: Insufficient documentation

## 2015-11-18 DIAGNOSIS — J939 Pneumothorax, unspecified: Secondary | ICD-10-CM | POA: Diagnosis not present

## 2015-11-18 DIAGNOSIS — J411 Mucopurulent chronic bronchitis: Secondary | ICD-10-CM | POA: Diagnosis not present

## 2015-11-18 NOTE — Assessment & Plan Note (Signed)
She produces mucus on a daily basis with a history of prior tobacco use so it's very likely that she has chronic bronchitis and perhaps emphysema. However, fortunately she has minimal symptoms despite the mucus production on a daily basis and it sounds like she never gets acute bronchitis or episodes of wheezing to suggest a diagnosis of COPD.  She has had pneumonia in the past, and she is at increased risk of this once again with the pain she's been experiencing from the rib fractures.  She is very reluctant to take antibiotics given her Ulcerative Colitis.  Plan: Take Mucinex twice a day for the next week She was counseled today and given direct instructions on how to use a pillow for splinting with cough I told her that she needs to keep coughing and producing mucus on a daily basis If she has worsening symptoms over time or if this ever turns into an episode of acute wheezing requiring steroids and antibiotics then we may need to consider some sort of nebulized bronchodilator or inhale corticosteroid treatment

## 2015-11-18 NOTE — Progress Notes (Signed)
Subjective:    Patient ID: [name], female    DOB: 10-11-1940, 75 y.o.   MRN: [id number]  Synopsis: Referred in 2017 after a minor traumatic pneumothorax after a fall. She is a previous smoker with a history of head and neck cancer status post laryngectomy with a patent tracheal stoma. Has symptoms consistent with chronic bronchitis but has never been formally diagnosed with COPD or had significant acute flareup of this problem.  HPI Chief Complaint  Patient presents with  . PULMONARY CONSULT    Pt here for abnormal CXR. Pt c/o increased mucus and chest tightness. Pt does have some CP due to rib fx. Pt is not on inhalers.     This is a pleasant 75 year old female who is a former tobacco user who comes to our clinic today for evaluation of a pneumothorax. She has a past medical history significant for head and neck cancer and has had a laryngectomy in the past and has never been diagnosed with a respiratory problem. She tells me that ever since her laryngectomy she's produce mucus on a daily basis. Her husband actually carries around a suction canister with him wherever they go and from time to time over the years he's had to use Mucomyst in order to help her clear secretions. However, despite this she's never had acute flareups of this problem and she's never been told that she had COPD or asthma. She has had pneumonia from time to time.  She was referred to me today because of an abnormal chest x-ray taken when she was seeing her gastroenterologist yesterday. Her husband says that he was visiting the neighbor for about 30 minutes and he returned and found her lying on the ground in their bedroom. She reports that she had been climbing a flight of stairs and going from a bathroom to their bedroom and she fell onto her right side. Her chest struck a TV stand and she had significant pain since then. She's continued to have a pleuritic chest pain in her right back and flank since this and has  been producing a bit more mucus than normal. She has also been quite fatigued and sleepy ever since this and her husband states that she's been in bed for most of the time since then. She was having some shortness of breath for the last several days but today she feels more awake and alert and is actually feeling less short of breath. She was referred to me for evaluation of the chest x-ray that showed a pneumothorax.  Past Medical History  Diagnosis Date  . Peripheral vascular disease ([place])   . Subdural hematoma ([place]) 1990s    s/p fall in bathtub  . Mild dysplasia of cervix   . UTI (lower urinary tract infection)   . Urinary incontinence   . Cerebrovascular disease 2000  . Personal history of colonic polyps     adenomatous 1997 & tubular adenoma and hyplastic  2008  . Hyperlipidemia     takes Pravastatin daily  . H/O alcohol abuse   . Diverticulosis of colon (without mention of hemorrhage)   . Redundant colon   . C. difficile colitis   . Ulcerative colitis     takes [place] daily  . Permanent atrial fibrillation ([place])   . Seizures ([place])     takes Phenobarbital and Keppra daily;last seizure was April 1,2014  . Stroke ([place]) 2000    off balance on right side a little and peripheral vision loss on  right side  . Arthritis   . Joint pain   . Joint swelling   . History of UTI 10-22-12  . Hypothyroidism     takes Levothyroxine daily  . 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 Anxiety   . Dementia   . Cancer of larynx ([place])   . Hypertension   . Gait disorder   . 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 Pneumonia 2/16  . Cellulitis      Family History  Problem Relation Age of Onset  . Coronary artery disease Mother   . Heart disease Mother   . Diabetes Sister   . Breast cancer Maternal Aunt   . Breast cancer Sister   . Colon cancer      nephew (sister's son)     Social History   Social History  . Marital Status: Married    Spouse  Name: Marden Noble  . Number of Children: 2  . Years of Education: 12   Occupational History  . retired     Art therapist   Social History Main Topics  . Smoking status: Former Smoker -- 70 years    Quit date: 10/26/2004  . Smokeless tobacco: Never Used     Comment: quit in 10/22/05  . Alcohol Use: 0.0 oz/week    0 Standard drinks or equivalent per week     Comment: normally 1 glass wine after dinner - occasional  . Drug Use: No  . Sexual Activity: No   Other Topics Concern  . Not on file   Social History Narrative   HSG. Married. married 10-23-2067. 1 son- died as a neonate, 1 son- 10-23-58. Retired- worked as a Art therapist.   End of life: has a living will- does not want futile/ heroic care; no cpr.     Marriage- reports marriage is in good health (4/10)      Patient is left handed.   Patient drinks 3-4 cups of tea daily.                 Allergies  Allergen Reactions  . Xarelto [Rivaroxaban]     Suffered a severe bleed  . Diltiazem Other (See Comments)    edema     Outpatient Prescriptions Prior to Visit  Medication Sig Dispense Refill  . aspirin 81 MG tablet Take 81 mg by mouth daily.    . Calcium Carbonate-Vitamin D (CALCIUM 500 + D PO) Take 1 tablet by mouth daily.    . digoxin (LANOXIN) 0.125 MG tablet TAKE 1 TABLET (125 MCG TOTAL) BY MOUTH DAILY. 90 tablet 1  . diphenoxylate-atropine (LOMOTIL) 2.5-0.025 MG tablet TAKE 1 TABLET BY MOUTH 4 TIMES DAILY AS NEEDED FOR DIARRHEA OR LOOSE STOOLS 60 tablet 3  . fluconazole (DIFLUCAN) 150 MG tablet Take 1 tablet (150 mg total) by mouth daily. 7 tablet 0  . furosemide (LASIX) 20 MG tablet TAKE 1 TABLET EVERY DAY 90 tablet 3  . levETIRAcetam (KEPPRA) 750 MG tablet Take 2 tablets (1,500 mg total) by mouth 2 (two) times daily. 360 tablet 3  . levothyroxine (SYNTHROID, LEVOTHROID) 50 MCG tablet TAKE 1 TABLET BY MOUTH ONCE EVERY EVENING 90 tablet 1  . lidocaine (XYLOCAINE) 2 % solution Take 20 mLs by mouth every 3 (three) hours as needed  for mouth pain. Oral pain    . Mesalamine 800 MG TBEC Take 6 tablets (4,800 mg total) by mouth daily. 540 tablet 1  .  Multiple Vitamin (MULTIVITAMIN WITH MINERALS) TABS Take 1 tablet by mouth daily.    Marland Kitchen PHENObarbital (LUMINAL) 100 MG tablet Take 1 tablet (100 mg total) by mouth at bedtime. 90 tablet 3  . pravastatin (PRAVACHOL) 40 MG tablet TAKE 1 TABLET (40 MG TOTAL) BY MOUTH EVERY EVENING. 90 tablet 1  . vitamin C (ASCORBIC ACID) 500 MG tablet Take 500 mg by mouth daily.    . predniSONE (DELTASONE) 10 MG tablet Take 1 tablet (10 mg total) by mouth daily. 30 tablet 0   No facility-administered medications prior to visit.       Review of Systems  Constitutional: Positive for fatigue. Negative for fever, chills, diaphoresis and appetite change.  HENT: Negative for congestion, hearing loss, nosebleeds, postnasal drip, rhinorrhea, sinus pressure, sore throat and trouble swallowing.   Eyes: Negative for discharge, redness and visual disturbance.  Respiratory: Positive for cough. Negative for choking, chest tightness, shortness of breath and wheezing.   Cardiovascular: Positive for chest pain. Negative for leg swelling.  Gastrointestinal: Negative for nausea, abdominal pain, diarrhea, constipation and blood in stool.  Genitourinary: Negative for dysuria, frequency and hematuria.  Musculoskeletal: Positive for arthralgias. Negative for myalgias, joint swelling and neck stiffness.  Skin: Negative for color change, pallor and rash.  Neurological: Negative for dizziness, seizures, facial asymmetry, speech difficulty, light-headedness, numbness and headaches.  Hematological: Negative for adenopathy. Does not bruise/bleed easily.       Objective:   Physical Exam  Filed Vitals:   11/18/15 1207  BP: 118/58  Pulse: 83  Height: 5\' 4"  (1.626 m)  Weight: 120 lb (54.432 kg)  SpO2: 93%   RA  Gen: chronically ill appearing, no acute distress HENT: NCAT, OP clear, s/p radical neck dissection  and laryngectomy with trach stoma covered by valve Eyes: PERRL, EOMi Lymph: no cervical lymphadenopathy PULM: diminished left base, no wheezing, good air movement CV: RRR, no mgr, no JVD GI: BS+, soft, nontender, no hsm Derm: no rash or skin breakdown MSK: normal bulk and tone Neuro: A&Ox4, CN II-XII intact, strength 5/5 in all 4 extremities Psyche: normal mood and affect  Chest x-ray images from today and yesterday were personally reviewed showing a very small pneumothorax in the right upper lobe and a trace pleural effusion in the right lower lobe. By my review there is no significant change in the size of the pneumothorax today compared to yesterday.     Assessment & Plan:  Pneumothorax, traumatic Fraser Din has a dramatic pneumothorax on the right side which is due to the fall she experienced 5 days ago leading to rib fractures. Fortunately, her symptoms have been improving and radiographically I see no significant difference in the size of the pneumothorax on today's chest x-ray compared to yesterday's.  Right now we don't need to make an invasive intervention. As this appears to be stable and small she does not require hospitalization. However, I have warned her about red flag symptoms of worsening pneumothorax including increasing chest pain and shortness of breath.  Plan: Use ibuprofen for pain Repeat chest x-ray in 7 days to ensure that this is not increasing in size She was counseled today to call us immediately if she has increasing shortness of breath or chest pain, she was also told that if she has the symptoms she may need to go to the emergency room   Chronic bronchitis ([place]) She produces mucus on a daily basis with a history of prior tobacco use so it's very likely that she has chronic  bronchitis and perhaps emphysema. However, fortunately she has minimal symptoms despite the mucus production on a daily basis and it sounds like she never gets acute bronchitis or episodes of  wheezing to suggest a diagnosis of COPD.  She has had pneumonia in the past, and she is at increased risk of this once again with the pain she's been experiencing from the rib fractures.  She is very reluctant to take antibiotics given her Ulcerative Colitis.  Plan: Take Mucinex twice a day for the next week She was counseled today and given direct instructions on how to use a pillow for splinting with cough I told her that she needs to keep coughing and producing mucus on a daily basis If she has worsening symptoms over time or if this ever turns into an episode of acute wheezing requiring steroids and antibiotics then we may need to consider some sort of nebulized bronchodilator or inhale corticosteroid treatment    Current outpatient prescriptions:  .  aspirin 81 MG tablet, Take 81 mg by mouth daily., Disp: , Rfl:  .  Calcium Carbonate-Vitamin D (CALCIUM 500 + D PO), Take 1 tablet by mouth daily., Disp: , Rfl:  .  digoxin (LANOXIN) 0.125 MG tablet, TAKE 1 TABLET (125 MCG TOTAL) BY MOUTH DAILY., Disp: 90 tablet, Rfl: 1 .  diphenoxylate-atropine (LOMOTIL) 2.5-0.025 MG tablet, TAKE 1 TABLET BY MOUTH 4 TIMES DAILY AS NEEDED FOR DIARRHEA OR LOOSE STOOLS, Disp: 60 tablet, Rfl: 3 .  fluconazole (DIFLUCAN) 150 MG tablet, Take 1 tablet (150 mg total) by mouth daily., Disp: 7 tablet, Rfl: 0 .  furosemide (LASIX) 20 MG tablet, TAKE 1 TABLET EVERY DAY, Disp: 90 tablet, Rfl: 3 .  levETIRAcetam (KEPPRA) 750 MG tablet, Take 2 tablets (1,500 mg total) by mouth 2 (two) times daily., Disp: 360 tablet, Rfl: 3 .  levothyroxine (SYNTHROID, LEVOTHROID) 50 MCG tablet, TAKE 1 TABLET BY MOUTH ONCE EVERY EVENING, Disp: 90 tablet, Rfl: 1 .  lidocaine (XYLOCAINE) 2 % solution, Take 20 mLs by mouth every 3 (three) hours as needed for mouth pain. Oral pain, Disp: , Rfl:  .  Mesalamine 800 MG TBEC, Take 6 tablets (4,800 mg total) by mouth daily., Disp: 540 tablet, Rfl: 1 .  Multiple Vitamin (MULTIVITAMIN WITH MINERALS)  TABS, Take 1 tablet by mouth daily., Disp: , Rfl:  .  PHENObarbital (LUMINAL) 100 MG tablet, Take 1 tablet (100 mg total) by mouth at bedtime., Disp: 90 tablet, Rfl: 3 .  pravastatin (PRAVACHOL) 40 MG tablet, TAKE 1 TABLET (40 MG TOTAL) BY MOUTH EVERY EVENING., Disp: 90 tablet, Rfl: 1 .  predniSONE (DELTASONE) 5 MG tablet, Take 5 mg by mouth daily with breakfast., Disp: , Rfl:  .  vitamin C (ASCORBIC ACID) 500 MG tablet, Take 500 mg by mouth daily., Disp: , Rfl:

## 2015-11-18 NOTE — Patient Instructions (Signed)
Take ibuprofen 2-3 times a day for the next 4-5 days to help with pain, I recommend 400 mg  If you have worsening chest pain or shortness of breath then you need to let us know immediately or go to the emergency room We will schedule a chest x-ray for next week and call you with the results You need to keep coughing, use a pillow in the way I demonstrated with you today to help with the pain when you cough Take Mucinex to help thin the mucus, I recommend 1200 mg extended release twice a day

## 2015-11-18 NOTE — Assessment & Plan Note (Signed)
[name] has a dramatic pneumothorax on the right side which is due to the fall she experienced 5 days ago leading to rib fractures. Fortunately, her symptoms have been improving and radiographically I see no significant difference in the size of the pneumothorax on today's chest x-ray compared to yesterday's.  Right now we don't need to make an invasive intervention. As this appears to be stable and small she does not require hospitalization. However, I have warned her about red flag symptoms of worsening pneumothorax including increasing chest pain and shortness of breath.  Plan: Use ibuprofen for pain Repeat chest x-ray in 7 days to ensure that this is not increasing in size She was counseled today to call us immediately if she has increasing shortness of breath or chest pain, she was also told that if she has the symptoms she may need to go to the emergency room

## 2015-11-25 ENCOUNTER — Ambulatory Visit (INDEPENDENT_AMBULATORY_CARE_PROVIDER_SITE_OTHER)
Admission: RE | Admit: 2015-11-25 | Discharge: 2015-11-25 | Disposition: A | Discharge status: Home / Self Care | Payer: PPO | Source: Ambulatory Visit | Attending: Pulmonary Disease | Admitting: Pulmonary Disease

## 2015-11-25 DIAGNOSIS — J939 Pneumothorax, unspecified: Secondary | ICD-10-CM | POA: Diagnosis not present

## 2015-11-25 DIAGNOSIS — J9811 Atelectasis: Secondary | ICD-10-CM | POA: Diagnosis not present

## 2015-11-26 ENCOUNTER — Other Ambulatory Visit: Payer: Self-pay | Admitting: *Deleted

## 2015-11-26 ENCOUNTER — Other Ambulatory Visit: Payer: Self-pay | Admitting: Internal Medicine

## 2015-11-26 MED ORDER — PREDNISONE 5 MG PO TABS: 5.0000 mg | ORAL_TABLET | Freq: Every day | ORAL | Status: DC

## 2015-12-07 ENCOUNTER — Ambulatory Visit (INDEPENDENT_AMBULATORY_CARE_PROVIDER_SITE_OTHER): Payer: PPO | Admitting: Internal Medicine

## 2015-12-07 ENCOUNTER — Encounter: Payer: Self-pay | Admitting: Internal Medicine

## 2015-12-07 VITALS — BP 110/70 | HR 75 | Temp 97.8°F | Resp 16 | Ht 64.0 in | Wt 115.0 lb

## 2015-12-07 DIAGNOSIS — E038 Other specified hypothyroidism: Secondary | ICD-10-CM | POA: Diagnosis not present

## 2015-12-07 NOTE — Progress Notes (Signed)
Pre visit review using our clinic review tool, if applicable. No additional management support is needed unless otherwise documented below in the visit note. 

## 2015-12-07 NOTE — Progress Notes (Signed)
Subjective:  Patient ID: [name], female    DOB: Feb 05, 1941  Age: 75 y.o. MRN: [id number]  CC: Hypothyroidism   HPI CATALEA BOUVIA presents for follow-up on hypothyroidism. Since I last saw her she has sustained a fall and developed rib fractures and a pneumothorax on the right side. She has been followed closely over the fast she weeks by pulmonary and has made a dramatic recovery. She tells me that most of the pain has resolved and she denies shortness of breath, cough, hemoptysis, fever, chills, or diaphoresis.  Outpatient Prescriptions Prior to Visit  Medication Sig Dispense Refill  . aspirin 81 MG tablet Take 81 mg by mouth daily.    . Calcium Carbonate-Vitamin D (CALCIUM 500 + D PO) Take 1 tablet by mouth daily.    . digoxin (LANOXIN) 0.125 MG tablet TAKE 1 TABLET (125 MCG TOTAL) BY MOUTH DAILY. 90 tablet 1  . diphenoxylate-atropine (LOMOTIL) 2.5-0.025 MG tablet TAKE 1 TABLET BY MOUTH 4 TIMES DAILY AS NEEDED FOR DIARRHEA OR LOOSE STOOLS 60 tablet 3  . fluconazole (DIFLUCAN) 150 MG tablet Take 1 tablet (150 mg total) by mouth daily. 7 tablet 0  . furosemide (LASIX) 20 MG tablet TAKE 1 TABLET EVERY DAY 90 tablet 3  . levETIRAcetam (KEPPRA) 750 MG tablet Take 2 tablets (1,500 mg total) by mouth 2 (two) times daily. 360 tablet 3  . levothyroxine (SYNTHROID, LEVOTHROID) 50 MCG tablet TAKE 1 TABLET BY MOUTH ONCE EVERY EVENING 90 tablet 1  . lidocaine (XYLOCAINE) 2 % solution Take 20 mLs by mouth every 3 (three) hours as needed for mouth pain. Oral pain    . Mesalamine 800 MG TBEC Take 6 tablets (4,800 mg total) by mouth daily. 540 tablet 1  . Multiple Vitamin (MULTIVITAMIN WITH MINERALS) TABS Take 1 tablet by mouth daily.    Marland Kitchen PHENObarbital (LUMINAL) 100 MG tablet Take 1 tablet (100 mg total) by mouth at bedtime. 90 tablet 3  . pravastatin (PRAVACHOL) 40 MG tablet TAKE 1 TABLET (40 MG TOTAL) BY MOUTH EVERY EVENING. 90 tablet 1  . predniSONE (DELTASONE) 5 MG tablet Take 1  tablet (5 mg total) by mouth daily with breakfast. 30 tablet 1  . vitamin C (ASCORBIC ACID) 500 MG tablet Take 500 mg by mouth daily.     No facility-administered medications prior to visit.    ROS Review of Systems  Constitutional: Negative.  Negative for fever, chills, appetite change and fatigue.  HENT: Negative.  Negative for facial swelling, sinus pressure, sore throat and trouble swallowing.   Eyes: Negative.  Negative for visual disturbance.  Respiratory: Negative.  Negative for cough, choking, chest tightness, shortness of breath and stridor.   Cardiovascular: Negative.  Negative for chest pain, palpitations and leg swelling.  Gastrointestinal: Negative.  Negative for nausea, vomiting, abdominal pain, diarrhea and constipation.  Endocrine: Negative.   Genitourinary: Negative.  Negative for urgency, hematuria, flank pain, decreased urine volume and difficulty urinating.  Musculoskeletal: Negative.  Negative for myalgias and back pain.  Skin: Negative.  Negative for rash.  Allergic/Immunologic: Negative.   Neurological: Negative.  Negative for dizziness.  Hematological: Negative.  Negative for adenopathy. Does not bruise/bleed easily.  Psychiatric/Behavioral: Negative.     Objective:  BP 110/70 mmHg  Pulse 75  Temp(Src) 97.8 F (36.6 C) (Oral)  Resp 16  Ht 5\' 4"  (1.626 m)  Wt 115 lb (52.164 kg)  BMI 19.73 kg/m2  SpO2 94%  BP Readings from Last 3 Encounters:  12/07/15 110/70  11/18/15 118/58  11/17/15 106/60    Wt Readings from Last 3 Encounters:  12/07/15 115 lb (52.164 kg)  11/18/15 120 lb (54.432 kg)  11/17/15 120 lb (54.432 kg)    Physical Exam  Constitutional: She is oriented to person, place, and time.  Non-toxic appearance. She does not have a sickly appearance. She does not appear ill. No distress.  HENT:  Mouth/Throat: Oropharynx is clear and moist. No oropharyngeal exudate.  Eyes: Conjunctivae are normal. Right eye exhibits no discharge. Left eye  exhibits no discharge. No scleral icterus.  Neck: Normal range of motion. Neck supple. No JVD present. No tracheal deviation present. No thyromegaly present.  Cardiovascular: Normal rate, regular rhythm, normal heart sounds and intact distal pulses.  Exam reveals no gallop and no friction rub.   No murmur heard. Pulmonary/Chest: Effort normal. No accessory muscle usage or stridor. No respiratory distress. She has decreased breath sounds in the right lower field and the left lower field. She has no wheezes. She has no rhonchi. She has no rales. She exhibits no tenderness.  Abdominal: Soft. Bowel sounds are normal. She exhibits no distension and no mass. There is no tenderness. There is no rebound and no guarding.  Musculoskeletal: Normal range of motion. She exhibits no edema or tenderness.  Lymphadenopathy:    She has no cervical adenopathy.  Neurological: She is oriented to person, place, and time.  Skin: Skin is warm and dry. No rash noted. No erythema. No pallor.  Vitals reviewed.   Lab Results  Component Value Date   WBC 14.9* 11/17/2015   HGB 13.3 11/17/2015   HCT 39.5 11/17/2015   PLT 198.0 11/17/2015   GLUCOSE 115* 11/17/2015   CHOL 131 02/16/2015   TRIG 83.0 02/16/2015   HDL 52.80 02/16/2015   LDLCALC 61 02/16/2015   ALT 13 11/17/2015   AST 16 11/17/2015   NA 132* 11/17/2015   K 4.2 11/17/2015   CL 92* 11/17/2015   CREATININE 0.75 11/17/2015   BUN 21 11/17/2015   CO2 34* 11/17/2015   TSH 1.23 06/08/2015   INR 1.24 01/07/2015   HGBA1C 6.0 06/08/2015    Dg Chest 2 View  11/25/2015  CLINICAL DATA:  Shallow breathing. EXAM: CHEST  2 VIEW COMPARISON:  Nov 18, 2015. FINDINGS: Stable cardiomediastinal silhouette. No pneumothorax is noted currently. Right axillary surgical clips are noted. Stable minimal right pleural effusion is noted. Left lung is clear except for probable overlying nipple shadow. Stable compression fracture of lower thoracic vertebral body is noted. Stable  mild right basilar subsegmental atelectasis is noted. IMPRESSION: No pneumothorax is noted currently. Stable mild right basilar atelectasis is noted with minimal associated pleural effusion. Electronically Signed   By: Marijo Conception, M.D.   On: 11/25/2015 16:18   Dg Chest 2 View  11/25/2015  CLINICAL DATA:  Shallow breathing. EXAM: CHEST  2 VIEW COMPARISON:  Nov 18, 2015. FINDINGS: Stable cardiomediastinal silhouette. No pneumothorax is noted currently. Right axillary surgical clips are noted. Stable minimal right pleural effusion is noted. Left lung is clear except for probable overlying nipple shadow. Stable compression fracture of lower thoracic vertebral body is noted. Stable mild right basilar subsegmental atelectasis is noted. IMPRESSION: No pneumothorax is noted currently. Stable mild right basilar atelectasis is noted with minimal associated pleural effusion. Electronically Signed   By: Marijo Conception, M.D.   On: 11/25/2015 16:18   Dg Chest 2 View  11/18/2015  CLINICAL DATA:  Follow-up pneumothorax and pleural effusion. EXAM: CHEST  2 VIEW COMPARISON:  Yesterday FINDINGS: Unchanged small right hydro pneumothorax. The gas component is estimated at 10%. Mild right basilar atelectasis. Cardiomegaly. Bilateral chest and neck soft tissue clips. IMPRESSION: Unchanged small right hydro pneumothorax. The gas component is approximately 10%. Electronically Signed   By: Monte Fantasia M.D.   On: 11/18/2015 14:25   Dg Chest 2 View  11/17/2015  CLINICAL DATA:  Recent fall with right-sided chest pain, initial encounter EXAM: CHEST  2 VIEW COMPARISON:  08/11/2014 FINDINGS: Cardiac shadow is stable. The lungs are well aerated bilaterally. A small right pleural effusion is noted. Small right pneumothorax is noted as well. Posterior right sixth rib fracture is noted. Likely other fractures are present as well. Degenerative changes of the right shoulder joint are seen. IMPRESSION: Right sixth rib fracture with  small effusion and pneumothorax. These results will be called to the ordering clinician or representative by the Radiologist Assistant, and communication documented in the PACS or zVision Dashboard. Electronically Signed   By: Inez Catalina M.D.   On: 11/17/2015 16:12   Dg Ribs Unilateral Right  11/17/2015  CLINICAL DATA:  Recent fall with right-sided chest pain, initial encounter EXAM: RIGHT RIBS - 2 VIEW COMPARISON:  None. FINDINGS: A right-sided pneumothorax and small pleural effusion are again identified. There again noted changes consistent with right sixth rib fracture posteriorly. No other definitive rib fractures are seen. IMPRESSION: Right sixth rib fracture with small pneumothorax and small pleural effusion. These results will be called to the ordering clinician or representative by the Radiologist Assistant, and communication documented in the PACS or zVision Dashboard. Electronically Signed   By: Inez Catalina M.D.   On: 11/17/2015 16:13    Assessment & Plan:   Dondi was seen today for hypothyroidism.  Diagnoses and all orders for this visit:  Other specified hypothyroidism- I will monitor her TSH and will adjust her dose if indicated -     TSH; Future   I am having Ms. Saner maintain her multivitamin with minerals, lidocaine, Calcium Carbonate-Vitamin D (CALCIUM 500 + D PO), aspirin, levothyroxine, pravastatin, furosemide, digoxin, vitamin C, levETIRAcetam, PHENObarbital, diphenoxylate-atropine, fluconazole, Mesalamine, and predniSONE.  No orders of the defined types were placed in this encounter.     Follow-up: Return in about 4 months (around 04/07/2016).  Scarlette Calico, MD

## 2015-12-07 NOTE — Patient Instructions (Signed)

## 2015-12-28 ENCOUNTER — Other Ambulatory Visit: Payer: Self-pay | Admitting: Internal Medicine

## 2015-12-28 NOTE — Telephone Encounter (Signed)
Faxed script back to [facility].../lmb 

## 2015-12-29 ENCOUNTER — Other Ambulatory Visit: Payer: Self-pay | Admitting: Internal Medicine

## 2016-01-21 ENCOUNTER — Other Ambulatory Visit: Payer: Self-pay | Admitting: Internal Medicine

## 2016-02-22 ENCOUNTER — Other Ambulatory Visit (INDEPENDENT_AMBULATORY_CARE_PROVIDER_SITE_OTHER): Payer: PPO

## 2016-02-22 ENCOUNTER — Ambulatory Visit (INDEPENDENT_AMBULATORY_CARE_PROVIDER_SITE_OTHER): Payer: PPO | Admitting: Internal Medicine

## 2016-02-22 ENCOUNTER — Encounter: Payer: Self-pay | Admitting: Internal Medicine

## 2016-02-22 VITALS — BP 108/62 | HR 73 | Temp 98.5°F | Resp 16 | Ht 64.0 in | Wt 115.0 lb

## 2016-02-22 DIAGNOSIS — D509 Iron deficiency anemia, unspecified: Secondary | ICD-10-CM

## 2016-02-22 DIAGNOSIS — E038 Other specified hypothyroidism: Secondary | ICD-10-CM | POA: Diagnosis not present

## 2016-02-22 DIAGNOSIS — E785 Hyperlipidemia, unspecified: Secondary | ICD-10-CM

## 2016-02-22 DIAGNOSIS — R7303 Prediabetes: Secondary | ICD-10-CM | POA: Diagnosis not present

## 2016-02-22 DIAGNOSIS — Z0000 Encounter for general adult medical examination without abnormal findings: Secondary | ICD-10-CM

## 2016-02-22 DIAGNOSIS — I10 Essential (primary) hypertension: Secondary | ICD-10-CM

## 2016-02-22 LAB — COMPREHENSIVE METABOLIC PANEL
ALBUMIN: 4.5 g/dL (ref 3.5–5.2)
ALT: 14 U/L (ref 0–35)
AST: 17 U/L (ref 0–37)
Alkaline Phosphatase: 113 U/L (ref 39–117)
BUN: 9 mg/dL (ref 6–23)
CHLORIDE: 89 meq/L — AB (ref 96–112)
CO2: 34 meq/L — AB (ref 19–32)
CREATININE: 0.65 mg/dL (ref 0.40–1.20)
Calcium: 9.9 mg/dL (ref 8.4–10.5)
GFR: 93.78 mL/min (ref >60.00)
Glucose, Bld: 93 mg/dL (ref 70–99)
Potassium: 5.7 mEq/L — ABNORMAL HIGH (ref 3.5–5.1)
SODIUM: 129 meq/L — AB (ref 135–145)
Total Bilirubin: 0.7 mg/dL (ref 0.2–1.2)
Total Protein: 8 g/dL (ref 6.0–8.3)

## 2016-02-22 LAB — CBC WITH DIFFERENTIAL/PLATELET
BASOS ABS: 0 10*3/uL (ref 0.0–0.1)
BASOS PCT: 0.3 % (ref 0.0–3.0)
EOS ABS: 0.1 10*3/uL (ref 0.0–0.7)
Eosinophils Relative: 1.3 % (ref 0.0–5.0)
HEMATOCRIT: 39.7 % (ref 36.0–46.0)
HEMOGLOBIN: 13.6 g/dL (ref 12.0–15.0)
Lymphocytes Relative: 21.9 % (ref 12.0–46.0)
Lymphs Abs: 2.2 10*3/uL (ref 0.7–4.0)
MCHC: 34.4 g/dL (ref 30.0–36.0)
MCV: 99.5 fl (ref 78.0–100.0)
Monocytes Absolute: 0.7 10*3/uL (ref 0.1–1.0)
Monocytes Relative: 6.4 % (ref 3.0–12.0)
NEUTROS ABS: 7.2 10*3/uL (ref 1.4–7.7)
Neutrophils Relative %: 70.1 % (ref 43.0–77.0)
Platelets: 228 10*3/uL (ref 150.0–400.0)
RBC: 3.98 Mil/uL (ref 3.87–5.11)
RDW: 16.1 % — ABNORMAL HIGH (ref 11.5–15.5)
WBC: 10.3 10*3/uL (ref 4.0–10.5)

## 2016-02-22 LAB — LIPID PANEL
CHOL/HDL RATIO: 2
Cholesterol: 176 mg/dL (ref 0–200)
HDL: 92.7 mg/dL (ref >39.00)
LDL CALC: 71 mg/dL (ref 0–99)
NonHDL: 83.11
TRIGLYCERIDES: 60 mg/dL (ref 0.0–149.0)
VLDL: 12 mg/dL (ref 0.0–40.0)

## 2016-02-22 LAB — FERRITIN: FERRITIN: 76 ng/mL (ref 10.0–291.0)

## 2016-02-22 LAB — TSH: TSH: 1.44 u[IU]/mL (ref 0.35–4.50)

## 2016-02-22 LAB — HEMOGLOBIN A1C: HEMOGLOBIN A1C: 5.6 % (ref 4.6–6.5)

## 2016-02-22 LAB — IBC PANEL
Iron: 78 ug/dL (ref 42–145)
Saturation Ratios: 24.2 % (ref 20.0–50.0)
Transferrin: 230 mg/dL (ref 212.0–360.0)

## 2016-02-22 NOTE — Progress Notes (Signed)
Pre visit review using our clinic review tool, if applicable. No additional management support is needed unless otherwise documented below in the visit note. 

## 2016-02-22 NOTE — Progress Notes (Signed)
Subjective:  Patient ID: [name], female    DOB: 01-06-1941  Age: 75 y.o. MRN: [id number]  CC: Annual Exam; Hypertension; Hypothyroidism; and Anemia   HPI EIMILE SACHS presents for a CPX/AWV.  She is due for follow-up on hypothyroidism. She tells me that she feels well at the current dose. She denies any recent episodes of fatigue, constipation, changes in her weight or appetite. She continues to have an occasional episode of diarrhea and urgency but has not seen blood in her stool.  She tells me her blood pressure has been well controlled with furosemide. She's had no recent episodes of headache/blurred vision/chest pain/shortness of breath/edema/palpitations/or fatigue.  She is tolerating her statin therapy well with no muscle or joint aches.     Past Medical History:  Diagnosis Date  . Adenocarcinoma, breast ([place])    bilateral  . Anxiety   . Arthritis   . C. difficile colitis   . Cancer of larynx ([place])   . Cellulitis   . Cerebrovascular disease 2000  . Dementia   . Diverticulosis of colon (without mention of hemorrhage)   . Gait disorder   . H/O alcohol abuse   . History of UTI 10-2012  . Hyperlipidemia    takes Pravastatin daily  . Hypertension   . Hypothyroidism    takes Levothyroxine daily  . Joint pain   . Joint swelling   . Mild dysplasia of cervix   . 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 Peripheral vascular disease ([place])   . Permanent atrial fibrillation ([place])   . Personal history of colonic polyps    adenomatous 1997 & tubular adenoma and hyplastic  2008  . Pneumonia 2/16  . Redundant colon   . Seizures ([place])    takes Phenobarbital and Keppra daily;last seizure was April 1,2014  . 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 Stroke ([place]) 2000   off balance on right side a little and peripheral vision loss on right side  . Subdural hematoma ([place]) 1990s   s/p fall in bathtub  . Ulcerative colitis    takes [place] daily  . Urinary incontinence     . UTI (lower urinary tract infection)    Past Surgical History:  Procedure Laterality Date  . BREAST LUMPECTOMY     left breast with radiation therapy  . CAROTID ENDARTERECTOMY     left  . CATARACT EXTRACTION, BILATERAL    . COLONOSCOPY    . FLEXIBLE SIGMOIDOSCOPY N/A 06/21/2013   Procedure: FLEXIBLE SIGMOIDOSCOPY;  Surgeon: Jerene Bears, MD;  Location: [facility];  Service: Gastroenterology;  Laterality: N/A;  . HAMMER TOE SURGERY Right 03/2013   pins and screws  . HARDWARE REMOVAL Right 11/14/2013   Procedure: RIGHT SHOUDER HARDWARE REMOVAL;  Surgeon: Nita Sells, MD;  Location: [place];  Service: Orthopedics;  Laterality: Right;  . INCISION AND DRAINAGE Right 11/14/2013   Procedure: INCISION AND DRAINAGE;  Surgeon: Nita Sells, MD;  Location: [place];  Service: Orthopedics;  Laterality: Right;  . LARYNX SURGERY  08/2010   [facility] x 2  . MASTECTOMY     Right, history with nodule dissection  . MULTIPLE TOOTH EXTRACTIONS     due to oral cancer  . Oral Surgery for squamous cell carcinoma of the mouth     x 3  . ORIF HIP FRACTURE  Sept '12   Right hip: screw and plate repair.  . ORIF HUMERUS FRACTURE Right 10/30/2012   Procedure: OPEN REDUCTION  INTERNAL FIXATION (ORIF) HUMERAL SHAFT FRACTURE;  Surgeon: Hessie Dibble, MD;  Location: [facility];  Service: Orthopedics;  Laterality: Right;  BIG C-ARM, SHOULDER POSITION  . SHOULDER SURGERY  10/2013  . SUBDURAL HEMATOMA EVACUATION VIA CRANIOTOMY    . TUBAL LIGATION      reports that she quit smoking about 11 years ago. She quit after 40.00 years of use. She has never used smokeless tobacco. She reports that she drinks alcohol. She reports that she does not use drugs. family history includes Breast cancer in her maternal aunt and sister; Coronary artery disease in her mother; Diabetes in her sister; Heart disease in her mother. Allergies  Allergen Reactions  . Xarelto [Rivaroxaban]     Suffered a severe bleed  .  Diltiazem Other (See Comments)    edema    Outpatient Medications Prior to Visit  Medication Sig Dispense Refill  . aspirin 81 MG tablet Take 81 mg by mouth daily.    . Calcium Carbonate-Vitamin D (CALCIUM 500 + D PO) Take 1 tablet by mouth daily.    . digoxin (LANOXIN) 0.125 MG tablet TAKE 1 TABLET (125 MCG TOTAL) BY MOUTH DAILY. 90 tablet 1  . diphenoxylate-atropine (LOMOTIL) 2.5-0.025 MG tablet TAKE 1 TABLET BY MOUTH 4 TIMES DAILY AS NEEDED FOR DIARRHEA OR LOOSE STOOLS 60 tablet 3  . furosemide (LASIX) 20 MG tablet TAKE 1 TABLET EVERY DAY 90 tablet 3  . levETIRAcetam (KEPPRA) 750 MG tablet Take 2 tablets (1,500 mg total) by mouth 2 (two) times daily. 360 tablet 3  . levothyroxine (SYNTHROID, LEVOTHROID) 50 MCG tablet TAKE 1 TABLET BY MOUTH ONCE EVERY EVENING 90 tablet 1  . lidocaine (XYLOCAINE) 2 % solution Take 20 mLs by mouth every 3 (three) hours as needed for mouth pain. Oral pain    . Mesalamine 800 MG TBEC Take 6 tablets (4,800 mg total) by mouth daily. 540 tablet 1  . Multiple Vitamin (MULTIVITAMIN WITH MINERALS) TABS Take 1 tablet by mouth daily.    Marland Kitchen PHENObarbital (LUMINAL) 100 MG tablet TAKE 1 TABLET BY MOUTH EVERY DAY AT BEDTIME 90 tablet 1  . pravastatin (PRAVACHOL) 40 MG tablet TAKE 1 TABLET (40 MG TOTAL) BY MOUTH EVERY EVENING. 90 tablet 1  . predniSONE (DELTASONE) 5 MG tablet TAKE 1 TABLET (5 MG TOTAL) BY MOUTH DAILY WITH BREAKFAST. 30 tablet 0  . vitamin C (ASCORBIC ACID) 500 MG tablet Take 500 mg by mouth daily.    . fluconazole (DIFLUCAN) 150 MG tablet Take 1 tablet (150 mg total) by mouth daily. 7 tablet 0   No facility-administered medications prior to visit.     ROS Review of Systems  Constitutional: Negative.  Negative for activity change, appetite change, chills, diaphoresis, fatigue and fever.  HENT: Negative.  Negative for trouble swallowing.   Eyes: Negative.  Negative for visual disturbance.  Respiratory: Negative.  Negative for cough, choking, chest  tightness, shortness of breath and stridor.   Cardiovascular: Negative.  Negative for chest pain, palpitations and leg swelling.  Gastrointestinal: Positive for diarrhea. Negative for abdominal distention, abdominal pain, anal bleeding, blood in stool, constipation, nausea, rectal pain and vomiting.  Endocrine: Negative.   Genitourinary: Negative.   Musculoskeletal: Negative.  Negative for arthralgias, back pain, joint swelling, myalgias and neck pain.  Skin: Negative.  Negative for color change and rash.  Allergic/Immunologic: Negative.   Neurological: Negative.  Negative for dizziness, weakness and light-headedness.  Hematological: Negative.  Negative for adenopathy. Does not bruise/bleed easily.  Psychiatric/Behavioral: Positive  for decreased concentration. Negative for agitation, confusion, dysphoric mood, sleep disturbance and suicidal ideas. The patient is not nervous/anxious.     Objective:  BP 108/62 (BP Location: Left Arm, Patient Position: Sitting, Cuff Size: Normal)   Pulse 73   Temp 98.5 F (36.9 C) (Oral)   Ht 5\' 4"  (1.626 m)   Wt 115 lb (52.2 kg)   SpO2 94%   BMI 19.74 kg/m   BP Readings from Last 3 Encounters:  02/22/16 108/62  12/07/15 110/70  11/18/15 (!) 118/58    Wt Readings from Last 3 Encounters:  02/22/16 115 lb (52.2 kg)  12/07/15 115 lb (52.2 kg)  11/18/15 120 lb (54.4 kg)    Physical Exam  Constitutional: She is oriented to person, place, and time. No distress.  HENT:  Mouth/Throat: Oropharynx is clear and moist. No oropharyngeal exudate.  Eyes: Conjunctivae are normal. Right eye exhibits no discharge. Left eye exhibits no discharge. No scleral icterus.  Neck: Normal range of motion. Neck supple. No JVD present. No tracheal deviation present. No thyromegaly present.  Cardiovascular: Normal rate, normal heart sounds and intact distal pulses.  An irregularly irregular rhythm present. Exam reveals no gallop and no friction rub.   No murmur  heard. Pulmonary/Chest: Effort normal and breath sounds normal. No stridor. No respiratory distress. She has no wheezes. She has no rales. She exhibits no tenderness.  Abdominal: Soft. Bowel sounds are normal. She exhibits no distension and no mass. There is no tenderness. There is no rebound and no guarding.  Musculoskeletal: Normal range of motion. She exhibits no edema, tenderness or deformity.  Lymphadenopathy:    She has no cervical adenopathy.  Neurological: She is oriented to person, place, and time.  Skin: Skin is warm and dry. No rash noted. She is not diaphoretic. No erythema. No pallor.  Psychiatric: She has a normal mood and affect. Her behavior is normal. Judgment and thought content normal.  Vitals reviewed.   Lab Results  Component Value Date   WBC 10.3 02/22/2016   HGB 13.6 02/22/2016   HCT 39.7 02/22/2016   PLT 228.0 02/22/2016   GLUCOSE 93 02/22/2016   CHOL 176 02/22/2016   TRIG 60.0 02/22/2016   HDL 92.70 02/22/2016   LDLCALC 71 02/22/2016   ALT 14 02/22/2016   AST 17 02/22/2016   NA 129 (L) 02/22/2016   K 5.7 (H) 02/22/2016   CL 89 (L) 02/22/2016   CREATININE 0.65 02/22/2016   BUN 9 02/22/2016   CO2 34 (H) 02/22/2016   TSH 1.44 02/22/2016   INR 1.24 01/07/2015   HGBA1C 5.6 02/22/2016    Dg Chest 2 View  Result Date: 11/25/2015 CLINICAL DATA:  Shallow breathing. EXAM: CHEST  2 VIEW COMPARISON:  Nov 18, 2015. FINDINGS: Stable cardiomediastinal silhouette. No pneumothorax is noted currently. Right axillary surgical clips are noted. Stable minimal right pleural effusion is noted. Left lung is clear except for probable overlying nipple shadow. Stable compression fracture of lower thoracic vertebral body is noted. Stable mild right basilar subsegmental atelectasis is noted. IMPRESSION: No pneumothorax is noted currently. Stable mild right basilar atelectasis is noted with minimal associated pleural effusion. Electronically Signed   By: Marijo Conception, M.D.   On:  11/25/2015 16:18    Assessment & Plan:   Sanaa was seen today for annual exam, hypertension, hypothyroidism and anemia.  Diagnoses and all orders for this visit:  Essential hypertension- her blood pressure is well controlled but she has developed hyponatremia and hypochloremia which  is most likely a contribution of the loop diuretic plus diarrhea, she is asymptomatic with respect to this, I've asked return in a few weeks for me to recheck this and if needed I may consider discontinuing her loop diuretic -     Comprehensive metabolic panel; Future  Other specified hypothyroidism- her TSH is in the normal range, she will remain on current dose of levothyroxine. -     TSH; Future  Anemia, iron deficiency- improvement noted -     IBC panel; Future -     Ferritin; Future -     CBC with Differential/Platelet; Future  Hyperlipidemia with target LDL less than 100- she has achieved her LDL goal is doing well on the statin. -     Lipid panel; Future  Prediabetes- improvement noted, no medications are needed at this time. -     Hemoglobin A1c; Future -     Comprehensive metabolic panel; Future  Routine health maintenance   I have discontinued Ms. Mussell's vitamin C and fluconazole. I am also having her maintain her multivitamin with minerals, lidocaine, Calcium Carbonate-Vitamin D (CALCIUM 500 + D PO), aspirin, furosemide, digoxin, levETIRAcetam, diphenoxylate-atropine, Mesalamine, PHENObarbital, levothyroxine, pravastatin, and predniSONE.  No orders of the defined types were placed in this encounter.  See AVS for instructions about healthy living and anticipatory guidance.  Follow-up: Return in about 6 months (around 08/24/2016).  Scarlette Calico, MD

## 2016-02-22 NOTE — Patient Instructions (Signed)
Preventive Care for Adults, Female A healthy lifestyle and preventive care can promote health and wellness. Preventive health guidelines for women include the following key practices.  A routine yearly physical is a good way to check with your health care provider about your health and preventive screening. It is a chance to share any concerns and updates on your health and to receive a thorough exam.  Visit your dentist for a routine exam and preventive care every 6 months. Brush your teeth twice a day and floss once a day. Good oral hygiene prevents tooth decay and gum disease.  The frequency of eye exams is based on your age, health, family medical history, use of contact lenses, and other factors. Follow your health care provider's recommendations for frequency of eye exams.  Eat a healthy diet. Foods like vegetables, fruits, whole grains, low-fat dairy products, and lean protein foods contain the nutrients you need without too many calories. Decrease your intake of foods high in solid fats, added sugars, and salt. Eat the right amount of calories for you.Get information about a proper diet from your health care provider, if necessary.  Regular physical exercise is one of the most important things you can do for your health. Most adults should get at least 150 minutes of moderate-intensity exercise (any activity that increases your heart rate and causes you to sweat) each week. In addition, most adults need muscle-strengthening exercises on 2 or more days a week.  Maintain a healthy weight. The body mass index (BMI) is a screening tool to identify possible weight problems. It provides an estimate of body fat based on height and weight. Your health care provider can find your BMI and can help you achieve or maintain a healthy weight.For adults 20 years and older:  A BMI below 18.5 is considered underweight.  A BMI of 18.5 to 24.9 is normal.  A BMI of 25 to 29.9 is considered overweight.  A  BMI of 30 and above is considered obese.  Maintain normal blood lipids and cholesterol levels by exercising and minimizing your intake of saturated fat. Eat a balanced diet with plenty of fruit and vegetables. Blood tests for lipids and cholesterol should begin at age 45 and be repeated every 5 years. If your lipid or cholesterol levels are high, you are over 50, or you are at high risk for heart disease, you may need your cholesterol levels checked more frequently.Ongoing high lipid and cholesterol levels should be treated with medicines if diet and exercise are not working.  If you smoke, find out from your health care provider how to quit. If you do not use tobacco, do not start.  Lung cancer screening is recommended for adults aged 45-80 years who are at high risk for developing lung cancer because of a history of smoking. A yearly low-dose CT scan of the lungs is recommended for people who have at least a 30-pack-year history of smoking and are a current smoker or have quit within the past 15 years. A pack year of smoking is smoking an average of 1 pack of cigarettes a day for 1 year (for example: 1 pack a day for 30 years or 2 packs a day for 15 years). Yearly screening should continue until the smoker has stopped smoking for at least 15 years. Yearly screening should be stopped for people who develop a health problem that would prevent them from having lung cancer treatment.  If you are pregnant, do not drink alcohol. If you are  breastfeeding, be very cautious about drinking alcohol. If you are not pregnant and choose to drink alcohol, do not have more than 1 drink per day. One drink is considered to be 12 ounces (355 mL) of beer, 5 ounces (148 mL) of wine, or 1.5 ounces (44 mL) of liquor.  Avoid use of street drugs. Do not share needles with anyone. Ask for help if you need support or instructions about stopping the use of drugs.  High blood pressure causes heart disease and increases the risk  of stroke. Your blood pressure should be checked at least every 1 to 2 years. Ongoing high blood pressure should be treated with medicines if weight loss and exercise do not work.  If you are 55-79 years old, ask your health care provider if you should take aspirin to prevent strokes.  Diabetes screening is done by taking a blood sample to check your blood glucose level after you have not eaten for a certain period of time (fasting). If you are not overweight and you do not have risk factors for diabetes, you should be screened once every 3 years starting at age 45. If you are overweight or obese and you are 40-70 years of age, you should be screened for diabetes every year as part of your cardiovascular risk assessment.  Breast cancer screening is essential preventive care for women. You should practice "breast self-awareness." This means understanding the normal appearance and feel of your breasts and may include breast self-examination. Any changes detected, no matter how small, should be reported to a health care provider. Women in their 20s and 30s should have a clinical breast exam (CBE) by a health care provider as part of a regular health exam every 1 to 3 years. After age 40, women should have a CBE every year. Starting at age 40, women should consider having a mammogram (breast X-ray test) every year. Women who have a family history of breast cancer should talk to their health care provider about genetic screening. Women at a high risk of breast cancer should talk to their health care providers about having an MRI and a mammogram every year.  Breast cancer gene (BRCA)-related cancer risk assessment is recommended for women who have family members with BRCA-related cancers. BRCA-related cancers include breast, ovarian, tubal, and peritoneal cancers. Having family members with these cancers may be associated with an increased risk for harmful changes (mutations) in the breast cancer genes BRCA1 and  BRCA2. Results of the assessment will determine the need for genetic counseling and BRCA1 and BRCA2 testing.  Your health care provider may recommend that you be screened regularly for cancer of the pelvic organs (ovaries, uterus, and vagina). This screening involves a pelvic examination, including checking for microscopic changes to the surface of your cervix (Pap test). You may be encouraged to have this screening done every 3 years, beginning at age 21.  For women ages 30-65, health care providers may recommend pelvic exams and Pap testing every 3 years, or they may recommend the Pap and pelvic exam, combined with testing for human papilloma virus (HPV), every 5 years. Some types of HPV increase your risk of cervical cancer. Testing for HPV may also be done on women of any age with unclear Pap test results.  Other health care providers may not recommend any screening for nonpregnant women who are considered low risk for pelvic cancer and who do not have symptoms. Ask your health care provider if a screening pelvic exam is right for   you.  If you have had past treatment for cervical cancer or a condition that could lead to cancer, you need Pap tests and screening for cancer for at least 20 years after your treatment. If Pap tests have been discontinued, your risk factors (such as having a new sexual partner) need to be reassessed to determine if screening should resume. Some women have medical problems that increase the chance of getting cervical cancer. In these cases, your health care provider may recommend more frequent screening and Pap tests.  Colorectal cancer can be detected and often prevented. Most routine colorectal cancer screening begins at the age of 50 years and continues through age 75 years. However, your health care provider may recommend screening at an earlier age if you have risk factors for colon cancer. On a yearly basis, your health care provider may provide home test kits to check  for hidden blood in the stool. Use of a small camera at the end of a tube, to directly examine the colon (sigmoidoscopy or colonoscopy), can detect the earliest forms of colorectal cancer. Talk to your health care provider about this at age 50, when routine screening begins. Direct exam of the colon should be repeated every 5-10 years through age 75 years, unless early forms of precancerous polyps or small growths are found.  People who are at an increased risk for hepatitis B should be screened for this virus. You are considered at high risk for hepatitis B if:  You were born in a country where hepatitis B occurs often. Talk with your health care provider about which countries are considered high risk.  Your parents were born in a high-risk country and you have not received a shot to protect against hepatitis B (hepatitis B vaccine).  You have HIV or AIDS.  You use needles to inject street drugs.  You live with, or have sex with, someone who has hepatitis B.  You get hemodialysis treatment.  You take certain medicines for conditions like cancer, organ transplantation, and autoimmune conditions.  Hepatitis C blood testing is recommended for all people born from 1945 through 1965 and any individual with known risks for hepatitis C.  Practice safe sex. Use condoms and avoid high-risk sexual practices to reduce the spread of sexually transmitted infections (STIs). STIs include gonorrhea, chlamydia, syphilis, trichomonas, herpes, HPV, and human immunodeficiency virus (HIV). Herpes, HIV, and HPV are viral illnesses that have no cure. They can result in disability, cancer, and death.  You should be screened for sexually transmitted illnesses (STIs) including gonorrhea and chlamydia if:  You are sexually active and are younger than 24 years.  You are older than 24 years and your health care provider tells you that you are at risk for this type of infection.  Your sexual activity has changed  since you were last screened and you are at an increased risk for chlamydia or gonorrhea. Ask your health care provider if you are at risk.  If you are at risk of being infected with HIV, it is recommended that you take a prescription medicine daily to prevent HIV infection. This is called preexposure prophylaxis (PrEP). You are considered at risk if:  You are sexually active and do not regularly use condoms or know the HIV status of your partner(s).  You take drugs by injection.  You are sexually active with a partner who has HIV.  Talk with your health care provider about whether you are at high risk of being infected with HIV. If   you choose to begin PrEP, you should first be tested for HIV. You should then be tested every 3 months for as long as you are taking PrEP.  Osteoporosis is a disease in which the bones lose minerals and strength with aging. This can result in serious bone fractures or breaks. The risk of osteoporosis can be identified using a bone density scan. Women ages 67 years and over and women at risk for fractures or osteoporosis should discuss screening with their health care providers. Ask your health care provider whether you should take a calcium supplement or vitamin D to reduce the rate of osteoporosis.  Menopause can be associated with physical symptoms and risks. Hormone replacement therapy is available to decrease symptoms and risks. You should talk to your health care provider about whether hormone replacement therapy is right for you.  Use sunscreen. Apply sunscreen liberally and repeatedly throughout the day. You should seek shade when your shadow is shorter than you. Protect yourself by wearing long sleeves, pants, a wide-brimmed hat, and sunglasses year round, whenever you are outdoors.  Once a month, do a whole body skin exam, using a mirror to look at the skin on your back. Tell your health care provider of new moles, moles that have irregular borders, moles that  are larger than a pencil eraser, or moles that have changed in shape or color.  Stay current with required vaccines (immunizations).  Influenza vaccine. All adults should be immunized every year.  Tetanus, diphtheria, and acellular pertussis (Td, Tdap) vaccine. Pregnant women should receive 1 dose of Tdap vaccine during each pregnancy. The dose should be obtained regardless of the length of time since the last dose. Immunization is preferred during the 27th-36th week of gestation. An adult who has not previously received Tdap or who does not know her vaccine status should receive 1 dose of Tdap. This initial dose should be followed by tetanus and diphtheria toxoids (Td) booster doses every 10 years. Adults with an unknown or incomplete history of completing a 3-dose immunization series with Td-containing vaccines should begin or complete a primary immunization series including a Tdap dose. Adults should receive a Td booster every 10 years.  Varicella vaccine. An adult without evidence of immunity to varicella should receive 2 doses or a second dose if she has previously received 1 dose. Pregnant females who do not have evidence of immunity should receive the first dose after pregnancy. This first dose should be obtained before leaving the health care facility. The second dose should be obtained 4-8 weeks after the first dose.  Human papillomavirus (HPV) vaccine. Females aged 13-26 years who have not received the vaccine previously should obtain the 3-dose series. The vaccine is not recommended for use in pregnant females. However, pregnancy testing is not needed before receiving a dose. If a female is found to be pregnant after receiving a dose, no treatment is needed. In that case, the remaining doses should be delayed until after the pregnancy. Immunization is recommended for any person with an immunocompromised condition through the age of 61 years if she did not get any or all doses earlier. During the  3-dose series, the second dose should be obtained 4-8 weeks after the first dose. The third dose should be obtained 24 weeks after the first dose and 16 weeks after the second dose.  Zoster vaccine. One dose is recommended for adults aged 30 years or older unless certain conditions are present.  Measles, mumps, and rubella (MMR) vaccine. Adults born  before 1957 generally are considered immune to measles and mumps. Adults born in 1957 or later should have 1 or more doses of MMR vaccine unless there is a contraindication to the vaccine or there is laboratory evidence of immunity to each of the three diseases. A routine second dose of MMR vaccine should be obtained at least 28 days after the first dose for students attending postsecondary schools, health care workers, or international travelers. People who received inactivated measles vaccine or an unknown type of measles vaccine during 1963-1967 should receive 2 doses of MMR vaccine. People who received inactivated mumps vaccine or an unknown type of mumps vaccine before 1979 and are at high risk for mumps infection should consider immunization with 2 doses of MMR vaccine. For females of childbearing age, rubella immunity should be determined. If there is no evidence of immunity, females who are not pregnant should be vaccinated. If there is no evidence of immunity, females who are pregnant should delay immunization until after pregnancy. Unvaccinated health care workers born before 1957 who lack laboratory evidence of measles, mumps, or rubella immunity or laboratory confirmation of disease should consider measles and mumps immunization with 2 doses of MMR vaccine or rubella immunization with 1 dose of MMR vaccine.  Pneumococcal 13-valent conjugate (PCV13) vaccine. When indicated, a person who is uncertain of his immunization history and has no record of immunization should receive the PCV13 vaccine. All adults 65 years of age and older should receive this  vaccine. An adult aged 19 years or older who has certain medical conditions and has not been previously immunized should receive 1 dose of PCV13 vaccine. This PCV13 should be followed with a dose of pneumococcal polysaccharide (PPSV23) vaccine. Adults who are at high risk for pneumococcal disease should obtain the PPSV23 vaccine at least 8 weeks after the dose of PCV13 vaccine. Adults older than 77 years of age who have normal immune system function should obtain the PPSV23 vaccine dose at least 1 year after the dose of PCV13 vaccine.  Pneumococcal polysaccharide (PPSV23) vaccine. When PCV13 is also indicated, PCV13 should be obtained first. All adults aged 65 years and older should be immunized. An adult younger than age 65 years who has certain medical conditions should be immunized. Any person who resides in a nursing home or long-term care facility should be immunized. An adult smoker should be immunized. People with an immunocompromised condition and certain other conditions should receive both PCV13 and PPSV23 vaccines. People with human immunodeficiency virus (HIV) infection should be immunized as soon as possible after diagnosis. Immunization during chemotherapy or radiation therapy should be avoided. Routine use of PPSV23 vaccine is not recommended for American Indians, [place] Natives, or people younger than 65 years unless there are medical conditions that require PPSV23 vaccine. When indicated, people who have unknown immunization and have no record of immunization should receive PPSV23 vaccine. One-time revaccination 5 years after the first dose of PPSV23 is recommended for people aged 19-64 years who have chronic kidney failure, nephrotic syndrome, asplenia, or immunocompromised conditions. People who received 1-2 doses of PPSV23 before age 65 years should receive another dose of PPSV23 vaccine at age 65 years or later if at least 5 years have passed since the previous dose. Doses of PPSV23 are not  needed for people immunized with PPSV23 at or after age 65 years.  Meningococcal vaccine. Adults with asplenia or persistent complement component deficiencies should receive 2 doses of quadrivalent meningococcal conjugate (MenACWY-D) vaccine. The doses should be obtained   at least 2 months apart. Microbiologists working with certain meningococcal bacteria, [place] recruits, people at risk during an outbreak, and people who travel to or live in countries with a high rate of meningitis should be immunized. A first-year college student up through age 34 years who is living in a residence hall should receive a dose if she did not receive a dose on or after her 16th birthday. Adults who have certain high-risk conditions should receive one or more doses of vaccine.  Hepatitis A vaccine. Adults who wish to be protected from this disease, have certain high-risk conditions, work with hepatitis A-infected animals, work in hepatitis A research labs, or travel to or work in countries with a high rate of hepatitis A should be immunized. Adults who were previously unvaccinated and who anticipate close contact with an international adoptee during the first 60 days after arrival in the [place] from a country with a high rate of hepatitis A should be immunized.  Hepatitis B vaccine. Adults who wish to be protected from this disease, have certain high-risk conditions, may be exposed to blood or other infectious body fluids, are household contacts or sex partners of hepatitis B positive people, are clients or workers in certain care facilities, or travel to or work in countries with a high rate of hepatitis B should be immunized.  Haemophilus influenzae type b (Hib) vaccine. A previously unvaccinated person with asplenia or sickle cell disease or having a scheduled splenectomy should receive 1 dose of Hib vaccine. Regardless of previous immunization, a recipient of a hematopoietic stem cell transplant should receive a  3-dose series 6-12 months after her successful transplant. Hib vaccine is not recommended for adults with HIV infection. Preventive Services / Frequency Ages 35 to 4 years  Blood pressure check.** / Every 3-5 years.  Lipid and cholesterol check.** / Every 5 years beginning at age 60.  Clinical breast exam.** / Every 3 years for women in their 71s and 10s.  BRCA-related cancer risk assessment.** / For women who have family members with a BRCA-related cancer (breast, ovarian, tubal, or peritoneal cancers).  Pap test.** / Every 2 years from ages 76 through 26. Every 3 years starting at age 61 through age 76 or 93 with a history of 3 consecutive normal Pap tests.  HPV screening.** / Every 3 years from ages 37 through ages 60 to 51 with a history of 3 consecutive normal Pap tests.  Hepatitis C blood test.** / For any individual with known risks for hepatitis C.  Skin self-exam. / Monthly.  Influenza vaccine. / Every year.  Tetanus, diphtheria, and acellular pertussis (Tdap, Td) vaccine.** / Consult your health care provider. Pregnant women should receive 1 dose of Tdap vaccine during each pregnancy. 1 dose of Td every 10 years.  Varicella vaccine.** / Consult your health care provider. Pregnant females who do not have evidence of immunity should receive the first dose after pregnancy.  HPV vaccine. / 3 doses over 6 months, if 93 and younger. The vaccine is not recommended for use in pregnant females. However, pregnancy testing is not needed before receiving a dose.  Measles, mumps, rubella (MMR) vaccine.** / You need at least 1 dose of MMR if you were born in 1957 or later. You may also need a 2nd dose. For females of childbearing age, rubella immunity should be determined. If there is no evidence of immunity, females who are not pregnant should be vaccinated. If there is no evidence of immunity, females who are  pregnant should delay immunization until after pregnancy.  Pneumococcal  13-valent conjugate (PCV13) vaccine.** / Consult your health care provider.  Pneumococcal polysaccharide (PPSV23) vaccine.** / 1 to 2 doses if you smoke cigarettes or if you have certain conditions.  Meningococcal vaccine.** / 1 dose if you are age 68 to 8 years and a Market researcher living in a residence hall, or have one of several medical conditions, you need to get vaccinated against meningococcal disease. You may also need additional booster doses.  Hepatitis A vaccine.** / Consult your health care provider.  Hepatitis B vaccine.** / Consult your health care provider.  Haemophilus influenzae type b (Hib) vaccine.** / Consult your health care provider. Ages 7 to 53 years  Blood pressure check.** / Every year.  Lipid and cholesterol check.** / Every 5 years beginning at age 25 years.  Lung cancer screening. / Every year if you are aged 11-80 years and have a 30-pack-year history of smoking and currently smoke or have quit within the past 15 years. Yearly screening is stopped once you have quit smoking for at least 15 years or develop a health problem that would prevent you from having lung cancer treatment.  Clinical breast exam.** / Every year after age 48 years.  BRCA-related cancer risk assessment.** / For women who have family members with a BRCA-related cancer (breast, ovarian, tubal, or peritoneal cancers).  Mammogram.** / Every year beginning at age 41 years and continuing for as long as you are in good health. Consult with your health care provider.  Pap test.** / Every 3 years starting at age 65 years through age 37 or 70 years with a history of 3 consecutive normal Pap tests.  HPV screening.** / Every 3 years from ages 72 years through ages 60 to 40 years with a history of 3 consecutive normal Pap tests.  Fecal occult blood test (FOBT) of stool. / Every year beginning at age 21 years and continuing until age 5 years. You may not need to do this test if you get  a colonoscopy every 10 years.  Flexible sigmoidoscopy or colonoscopy.** / Every 5 years for a flexible sigmoidoscopy or every 10 years for a colonoscopy beginning at age 35 years and continuing until age 48 years.  Hepatitis C blood test.** / For all people born from [birth year] through 1965 and any individual with known risks for hepatitis C.  Skin self-exam. / Monthly.  Influenza vaccine. / Every year.  Tetanus, diphtheria, and acellular pertussis (Tdap/Td) vaccine.** / Consult your health care provider. Pregnant women should receive 1 dose of Tdap vaccine during each pregnancy. 1 dose of Td every 10 years.  Varicella vaccine.** / Consult your health care provider. Pregnant females who do not have evidence of immunity should receive the first dose after pregnancy.  Zoster vaccine.** / 1 dose for adults aged 30 years or older.  Measles, mumps, rubella (MMR) vaccine.** / You need at least 1 dose of MMR if you were born in 1957 or later. You may also need a second dose. For females of childbearing age, rubella immunity should be determined. If there is no evidence of immunity, females who are not pregnant should be vaccinated. If there is no evidence of immunity, females who are pregnant should delay immunization until after pregnancy.  Pneumococcal 13-valent conjugate (PCV13) vaccine.** / Consult your health care provider.  Pneumococcal polysaccharide (PPSV23) vaccine.** / 1 to 2 doses if you smoke cigarettes or if you have certain conditions.  Meningococcal vaccine.** /  Consult your health care provider.  Hepatitis A vaccine.** / Consult your health care provider.  Hepatitis B vaccine.** / Consult your health care provider.  Haemophilus influenzae type b (Hib) vaccine.** / Consult your health care provider. Ages 64 years and over  Blood pressure check.** / Every year.  Lipid and cholesterol check.** / Every 5 years beginning at age 23 years.  Lung cancer screening. / Every year if you  are aged 16-80 years and have a 30-pack-year history of smoking and currently smoke or have quit within the past 15 years. Yearly screening is stopped once you have quit smoking for at least 15 years or develop a health problem that would prevent you from having lung cancer treatment.  Clinical breast exam.** / Every year after age 74 years.  BRCA-related cancer risk assessment.** / For women who have family members with a BRCA-related cancer (breast, ovarian, tubal, or peritoneal cancers).  Mammogram.** / Every year beginning at age 44 years and continuing for as long as you are in good health. Consult with your health care provider.  Pap test.** / Every 3 years starting at age 58 years through age 22 or 39 years with 3 consecutive normal Pap tests. Testing can be stopped between 65 and 70 years with 3 consecutive normal Pap tests and no abnormal Pap or HPV tests in the past 10 years.  HPV screening.** / Every 3 years from ages 64 years through ages 70 or 61 years with a history of 3 consecutive normal Pap tests. Testing can be stopped between 65 and 70 years with 3 consecutive normal Pap tests and no abnormal Pap or HPV tests in the past 10 years.  Fecal occult blood test (FOBT) of stool. / Every year beginning at age 40 years and continuing until age 27 years. You may not need to do this test if you get a colonoscopy every 10 years.  Flexible sigmoidoscopy or colonoscopy.** / Every 5 years for a flexible sigmoidoscopy or every 10 years for a colonoscopy beginning at age 7 years and continuing until age 32 years.  Hepatitis C blood test.** / For all people born from [birth year] through 1967 and any individual with known risks for hepatitis C.  Osteoporosis screening.** / A one-time screening for women ages 30 years and over and women at risk for fractures or osteoporosis.  Skin self-exam. / Monthly.  Influenza vaccine. / Every year.  Tetanus, diphtheria, and acellular pertussis (Tdap/Td)  vaccine.** / 1 dose of Td every 10 years.  Varicella vaccine.** / Consult your health care provider.  Zoster vaccine.** / 1 dose for adults aged 35 years or older.  Pneumococcal 13-valent conjugate (PCV13) vaccine.** / Consult your health care provider.  Pneumococcal polysaccharide (PPSV23) vaccine.** / 1 dose for all adults aged 46 years and older.  Meningococcal vaccine.** / Consult your health care provider.  Hepatitis A vaccine.** / Consult your health care provider.  Hepatitis B vaccine.** / Consult your health care provider.  Haemophilus influenzae type b (Hib) vaccine.** / Consult your health care provider. ** Family history and personal history of risk and conditions may change your health care provider's recommendations.   This information is not intended to replace advice given to you by your health care provider. Make sure you discuss any questions you have with your health care provider.   Document Released: 08/16/2001 Document Revised: 07/11/2014 Document Reviewed: 11/15/2010 Elsevier Interactive Patient Education [employer].

## 2016-02-25 ENCOUNTER — Other Ambulatory Visit: Payer: Self-pay | Admitting: Internal Medicine

## 2016-03-08 ENCOUNTER — Ambulatory Visit (INDEPENDENT_AMBULATORY_CARE_PROVIDER_SITE_OTHER): Payer: PPO

## 2016-03-08 DIAGNOSIS — Z23 Encounter for immunization: Secondary | ICD-10-CM | POA: Diagnosis not present

## 2016-03-16 ENCOUNTER — Other Ambulatory Visit: Payer: Self-pay | Admitting: Internal Medicine

## 2016-03-23 DIAGNOSIS — C060 Malignant neoplasm of cheek mucosa: Secondary | ICD-10-CM | POA: Diagnosis not present

## 2016-03-23 DIAGNOSIS — K1370 Unspecified lesions of oral mucosa: Secondary | ICD-10-CM | POA: Diagnosis not present

## 2016-03-23 DIAGNOSIS — C069 Malignant neoplasm of mouth, unspecified: Secondary | ICD-10-CM | POA: Diagnosis not present

## 2016-03-23 DIAGNOSIS — Z9089 Acquired absence of other organs: Secondary | ICD-10-CM | POA: Diagnosis not present

## 2016-03-27 ENCOUNTER — Other Ambulatory Visit: Payer: Self-pay | Admitting: Internal Medicine

## 2016-03-28 ENCOUNTER — Telehealth: Payer: Self-pay | Admitting: Internal Medicine

## 2016-03-28 NOTE — Telephone Encounter (Signed)
Would have her increase prednisone to 15 mg 1 week, 10 mg 1 week then back to 5 mg 1 week Continue mesalamine 4.8 grams per day If diarrhea not soon better with increased prednisone, I recommend stool testing for infection and C. Difficile Avoid NSAIDs if possible

## 2016-03-28 NOTE — Telephone Encounter (Signed)
Pts husband states she had some dental extractions done several weeks ago. She was told to take tylenol for pain but it was not helping, she was then told to take ibuprofen. Since she took the ibuprofen she has been having diarrhea stools for 2 weeks, multiple stools/day with a little BRB in the stool along with mucous. Husband states he does not think she has cdiff again, reports the odor is not like it was with cdiff and she has not been on antibiotics. Pt is currently on prednisone 5mg  daily. Please advise.

## 2016-03-28 NOTE — Telephone Encounter (Signed)
Spoke with pts husband and he is aware, he already has prednisone on hand.

## 2016-04-01 ENCOUNTER — Other Ambulatory Visit: Payer: Self-pay | Admitting: Internal Medicine

## 2016-04-06 DIAGNOSIS — C049 Malignant neoplasm of floor of mouth, unspecified: Secondary | ICD-10-CM | POA: Diagnosis not present

## 2016-04-06 DIAGNOSIS — C0680 Malignant neoplasm of overlapping sites of unspecified parts of mouth: Secondary | ICD-10-CM | POA: Diagnosis not present

## 2016-04-06 DIAGNOSIS — C069 Malignant neoplasm of mouth, unspecified: Secondary | ICD-10-CM | POA: Diagnosis not present

## 2016-04-08 ENCOUNTER — Other Ambulatory Visit: Payer: Self-pay

## 2016-04-08 ENCOUNTER — Telehealth: Payer: Self-pay | Admitting: Internal Medicine

## 2016-04-08 DIAGNOSIS — R197 Diarrhea, unspecified: Secondary | ICD-10-CM

## 2016-04-08 MED ORDER — PREDNISONE 10 MG PO TABS: 10.0000 mg | ORAL_TABLET | Freq: Every day | ORAL | 0 refills | Status: DC

## 2016-04-08 NOTE — Progress Notes (Signed)
Pts husband aware and script sent to pharmacy, order in epic for cdiff.

## 2016-04-08 NOTE — Telephone Encounter (Signed)
Pyrtle pt calling with diarrhea and urgency. On 03/28/16 pt was having the same issues and Dr. Hilarie Fredrickson recommended the following:   Jerene Bears, MD   03/28/16 3:59 PM  Note    Would have her increase prednisone to 15 mg 1 week, 10 mg 1 week then back to 5 mg 1 week Continue mesalamine 4.8 grams per day If diarrhea not soon better with increased prednisone, I recommend stool testing for infection and C. Difficile Avoid NSAIDs if possible      Pts husband called and states that when the prednisone was increased to 15mg  daily, about the 3rd day at 15mg , she did well and had hardly any urgency. She is now on 10mg  and husband states she had a diarrhea stool at midnight, 2:30am and 8am. Pt has urgency and cannot make it to the bathroom and is incontinent with the diarrhea. Dr. Carlean Purl as doc of the day please advise.

## 2016-04-08 NOTE — Telephone Encounter (Signed)
Go back to 15 mg daily on prednisone and also do stool for C diff PCR

## 2016-04-08 NOTE — Telephone Encounter (Signed)
Pts husband aware. Script sent to pharmacy and order in epic.

## 2016-04-11 ENCOUNTER — Other Ambulatory Visit: Payer: PPO

## 2016-04-11 DIAGNOSIS — R197 Diarrhea, unspecified: Secondary | ICD-10-CM | POA: Diagnosis not present

## 2016-04-12 ENCOUNTER — Telehealth: Payer: Self-pay

## 2016-04-12 ENCOUNTER — Other Ambulatory Visit: Payer: Self-pay

## 2016-04-12 LAB — CLOSTRIDIUM DIFFICILE BY PCR: CDIFFPCR: DETECTED — AB

## 2016-04-12 MED ORDER — VANCOMYCIN HCL 125 MG PO CAPS: 125.0000 mg | ORAL_CAPSULE | Freq: Four times a day (QID) | ORAL | 0 refills | Status: DC

## 2016-04-12 NOTE — Telephone Encounter (Signed)
Treatment recommendations already sent to [facility]

## 2016-04-12 NOTE — Telephone Encounter (Signed)
Positive C-Diff results just called to me from [facility] lab.

## 2016-04-12 NOTE — Progress Notes (Signed)
C diff causing diarrhea it seems Start vancomycin 125 mg qid x 10 days please I have cced Dr. Hilarie Fredrickson also to arrange f/u

## 2016-04-15 ENCOUNTER — Telehealth: Payer: Self-pay | Admitting: Internal Medicine

## 2016-04-15 NOTE — Telephone Encounter (Signed)
Can taper pred to 10 mg daily x 1 week, then back to 5 mg daily ongoing Continue mesalamine Lomotil can now be [facility]

## 2016-04-15 NOTE — Telephone Encounter (Signed)
Spoke with Mr. Clendennen and he is aware. Refill for lomotil sent to pharmacy.

## 2016-04-15 NOTE — Telephone Encounter (Signed)
Pts husband states pt is much better since starting the vanco. He wants to know if they should start tapering prednisone, she it currently taking 15mg  daily. Also wants to know if he should continue giving her lomotil BID and if so needs refill. Please advise.

## 2016-04-19 ENCOUNTER — Other Ambulatory Visit: Payer: Self-pay

## 2016-04-22 ENCOUNTER — Telehealth: Payer: Self-pay | Admitting: Internal Medicine

## 2016-04-22 NOTE — Telephone Encounter (Signed)
Doing well. Bowel movements are under control, no accidents and no diarrhea stools. Today is the last day of atb for C-diff. Mr Charnock wants to know if she needs to be retested for c-diff.? Will await answer. Taper to Prednisone 5 mg from 10 mg as previously discussed. Surgery for oral cancer and reconstruction is 05/03/16 at [facility].

## 2016-04-25 DIAGNOSIS — E039 Hypothyroidism, unspecified: Secondary | ICD-10-CM | POA: Diagnosis not present

## 2016-04-25 DIAGNOSIS — Z950 Presence of cardiac pacemaker: Secondary | ICD-10-CM | POA: Diagnosis not present

## 2016-04-25 DIAGNOSIS — E785 Hyperlipidemia, unspecified: Secondary | ICD-10-CM | POA: Diagnosis not present

## 2016-04-25 DIAGNOSIS — K5190 Ulcerative colitis, unspecified, without complications: Secondary | ICD-10-CM | POA: Diagnosis not present

## 2016-04-25 DIAGNOSIS — E871 Hypo-osmolality and hyponatremia: Secondary | ICD-10-CM | POA: Diagnosis not present

## 2016-04-25 DIAGNOSIS — Z87891 Personal history of nicotine dependence: Secondary | ICD-10-CM | POA: Diagnosis not present

## 2016-04-25 DIAGNOSIS — I10 Essential (primary) hypertension: Secondary | ICD-10-CM | POA: Diagnosis not present

## 2016-04-25 DIAGNOSIS — I629 Nontraumatic intracranial hemorrhage, unspecified: Secondary | ICD-10-CM | POA: Diagnosis not present

## 2016-04-25 DIAGNOSIS — Z7982 Long term (current) use of aspirin: Secondary | ICD-10-CM | POA: Diagnosis not present

## 2016-04-25 DIAGNOSIS — C049 Malignant neoplasm of floor of mouth, unspecified: Secondary | ICD-10-CM | POA: Diagnosis not present

## 2016-04-25 DIAGNOSIS — I4891 Unspecified atrial fibrillation: Secondary | ICD-10-CM | POA: Diagnosis not present

## 2016-04-25 NOTE — Telephone Encounter (Signed)
Would only retest for C. difficile in the setting of symptoms, thus would not recommend repeating PCR this time Glad to hear she is better

## 2016-04-26 NOTE — Telephone Encounter (Signed)
Left a message for the patient.

## 2016-05-03 DIAGNOSIS — C770 Secondary and unspecified malignant neoplasm of lymph nodes of head, face and neck: Secondary | ICD-10-CM | POA: Diagnosis not present

## 2016-05-03 DIAGNOSIS — D509 Iron deficiency anemia, unspecified: Secondary | ICD-10-CM | POA: Diagnosis not present

## 2016-05-03 DIAGNOSIS — E039 Hypothyroidism, unspecified: Secondary | ICD-10-CM | POA: Diagnosis not present

## 2016-05-03 DIAGNOSIS — D049 Carcinoma in situ of skin, unspecified: Secondary | ICD-10-CM | POA: Diagnosis not present

## 2016-05-03 DIAGNOSIS — F0390 Unspecified dementia without behavioral disturbance: Secondary | ICD-10-CM | POA: Diagnosis not present

## 2016-05-03 DIAGNOSIS — E785 Hyperlipidemia, unspecified: Secondary | ICD-10-CM | POA: Diagnosis not present

## 2016-05-03 DIAGNOSIS — G40909 Epilepsy, unspecified, not intractable, without status epilepticus: Secondary | ICD-10-CM | POA: Diagnosis not present

## 2016-05-03 DIAGNOSIS — D72829 Elevated white blood cell count, unspecified: Secondary | ICD-10-CM | POA: Diagnosis not present

## 2016-05-03 DIAGNOSIS — R509 Fever, unspecified: Secondary | ICD-10-CM | POA: Diagnosis not present

## 2016-05-03 DIAGNOSIS — I10 Essential (primary) hypertension: Secondary | ICD-10-CM | POA: Diagnosis not present

## 2016-05-03 DIAGNOSIS — I482 Chronic atrial fibrillation: Secondary | ICD-10-CM | POA: Diagnosis not present

## 2016-05-03 DIAGNOSIS — R296 Repeated falls: Secondary | ICD-10-CM | POA: Diagnosis not present

## 2016-05-03 DIAGNOSIS — I69351 Hemiplegia and hemiparesis following cerebral infarction affecting right dominant side: Secondary | ICD-10-CM | POA: Diagnosis not present

## 2016-05-03 DIAGNOSIS — Z4659 Encounter for fitting and adjustment of other gastrointestinal appliance and device: Secondary | ICD-10-CM | POA: Diagnosis not present

## 2016-05-03 DIAGNOSIS — K5190 Ulcerative colitis, unspecified, without complications: Secondary | ICD-10-CM | POA: Diagnosis not present

## 2016-05-03 DIAGNOSIS — J42 Unspecified chronic bronchitis: Secondary | ICD-10-CM | POA: Diagnosis not present

## 2016-05-03 DIAGNOSIS — I2510 Atherosclerotic heart disease of native coronary artery without angina pectoris: Secondary | ICD-10-CM | POA: Diagnosis not present

## 2016-05-03 DIAGNOSIS — Z9002 Acquired absence of larynx: Secondary | ICD-10-CM | POA: Diagnosis not present

## 2016-05-03 DIAGNOSIS — C049 Malignant neoplasm of floor of mouth, unspecified: Secondary | ICD-10-CM | POA: Diagnosis not present

## 2016-05-03 DIAGNOSIS — C7951 Secondary malignant neoplasm of bone: Secondary | ICD-10-CM | POA: Diagnosis not present

## 2016-05-09 DIAGNOSIS — I4891 Unspecified atrial fibrillation: Secondary | ICD-10-CM | POA: Diagnosis not present

## 2016-05-11 DIAGNOSIS — C049 Malignant neoplasm of floor of mouth, unspecified: Secondary | ICD-10-CM | POA: Diagnosis not present

## 2016-05-11 DIAGNOSIS — C029 Malignant neoplasm of tongue, unspecified: Secondary | ICD-10-CM | POA: Diagnosis not present

## 2016-05-11 DIAGNOSIS — C139 Malignant neoplasm of hypopharynx, unspecified: Secondary | ICD-10-CM | POA: Diagnosis not present

## 2016-05-11 DIAGNOSIS — J42 Unspecified chronic bronchitis: Secondary | ICD-10-CM | POA: Diagnosis not present

## 2016-05-12 DIAGNOSIS — I69351 Hemiplegia and hemiparesis following cerebral infarction affecting right dominant side: Secondary | ICD-10-CM | POA: Diagnosis not present

## 2016-05-12 DIAGNOSIS — M6281 Muscle weakness (generalized): Secondary | ICD-10-CM | POA: Diagnosis not present

## 2016-05-12 DIAGNOSIS — Z9981 Dependence on supplemental oxygen: Secondary | ICD-10-CM | POA: Diagnosis not present

## 2016-05-12 DIAGNOSIS — G40909 Epilepsy, unspecified, not intractable, without status epilepticus: Secondary | ICD-10-CM | POA: Diagnosis not present

## 2016-05-12 DIAGNOSIS — Z930 Tracheostomy status: Secondary | ICD-10-CM | POA: Diagnosis not present

## 2016-05-12 DIAGNOSIS — E440 Moderate protein-calorie malnutrition: Secondary | ICD-10-CM | POA: Diagnosis not present

## 2016-05-12 DIAGNOSIS — C329 Malignant neoplasm of larynx, unspecified: Secondary | ICD-10-CM | POA: Diagnosis not present

## 2016-05-12 DIAGNOSIS — C029 Malignant neoplasm of tongue, unspecified: Secondary | ICD-10-CM | POA: Diagnosis not present

## 2016-05-12 DIAGNOSIS — C139 Malignant neoplasm of hypopharynx, unspecified: Secondary | ICD-10-CM | POA: Diagnosis not present

## 2016-05-13 ENCOUNTER — Telehealth: Payer: Self-pay

## 2016-05-13 NOTE — Telephone Encounter (Signed)
PT lmom stating that they have completed PT evaluation and will continue PT at this time. No verbals needed at this time. Forwarding to PCP as FYI.

## 2016-05-17 ENCOUNTER — Encounter (HOSPITAL_COMMUNITY): Payer: Self-pay | Admitting: Emergency Medicine

## 2016-05-17 ENCOUNTER — Emergency Department (HOSPITAL_COMMUNITY): Payer: PPO

## 2016-05-17 ENCOUNTER — Other Ambulatory Visit: Payer: Self-pay | Admitting: Internal Medicine

## 2016-05-17 ENCOUNTER — Encounter: Payer: Self-pay | Admitting: Internal Medicine

## 2016-05-17 ENCOUNTER — Inpatient Hospital Stay (HOSPITAL_COMMUNITY)
Admission: EM | Admit: 2016-05-17 | Discharge: 2016-05-21 | DRG: 386 | Disposition: A | Discharge status: Home / Self Care | Payer: PPO | Attending: Internal Medicine | Admitting: Internal Medicine

## 2016-05-17 ENCOUNTER — Telehealth: Payer: Self-pay | Admitting: Internal Medicine

## 2016-05-17 DIAGNOSIS — Z681 Body mass index (BMI) 19 or less, adult: Secondary | ICD-10-CM | POA: Diagnosis not present

## 2016-05-17 DIAGNOSIS — E039 Hypothyroidism, unspecified: Secondary | ICD-10-CM | POA: Diagnosis not present

## 2016-05-17 DIAGNOSIS — K625 Hemorrhage of anus and rectum: Secondary | ICD-10-CM | POA: Diagnosis present

## 2016-05-17 DIAGNOSIS — G40909 Epilepsy, unspecified, not intractable, without status epilepticus: Secondary | ICD-10-CM | POA: Diagnosis present

## 2016-05-17 DIAGNOSIS — D72829 Elevated white blood cell count, unspecified: Secondary | ICD-10-CM

## 2016-05-17 DIAGNOSIS — Z7982 Long term (current) use of aspirin: Secondary | ICD-10-CM | POA: Diagnosis not present

## 2016-05-17 DIAGNOSIS — I482 Chronic atrial fibrillation: Secondary | ICD-10-CM | POA: Diagnosis present

## 2016-05-17 DIAGNOSIS — I739 Peripheral vascular disease, unspecified: Secondary | ICD-10-CM | POA: Diagnosis not present

## 2016-05-17 DIAGNOSIS — E871 Hypo-osmolality and hyponatremia: Secondary | ICD-10-CM | POA: Diagnosis present

## 2016-05-17 DIAGNOSIS — Z7952 Long term (current) use of systemic steroids: Secondary | ICD-10-CM

## 2016-05-17 DIAGNOSIS — D649 Anemia, unspecified: Secondary | ICD-10-CM | POA: Diagnosis present

## 2016-05-17 DIAGNOSIS — K51911 Ulcerative colitis, unspecified with rectal bleeding: Secondary | ICD-10-CM | POA: Diagnosis not present

## 2016-05-17 DIAGNOSIS — Z86010 Personal history of colonic polyps: Secondary | ICD-10-CM | POA: Diagnosis not present

## 2016-05-17 DIAGNOSIS — F419 Anxiety disorder, unspecified: Secondary | ICD-10-CM | POA: Diagnosis present

## 2016-05-17 DIAGNOSIS — R1319 Other dysphagia: Secondary | ICD-10-CM | POA: Diagnosis not present

## 2016-05-17 DIAGNOSIS — C329 Malignant neoplasm of larynx, unspecified: Secondary | ICD-10-CM | POA: Diagnosis not present

## 2016-05-17 DIAGNOSIS — Z8673 Personal history of transient ischemic attack (TIA), and cerebral infarction without residual deficits: Secondary | ICD-10-CM | POA: Diagnosis not present

## 2016-05-17 DIAGNOSIS — Z8249 Family history of ischemic heart disease and other diseases of the circulatory system: Secondary | ICD-10-CM

## 2016-05-17 DIAGNOSIS — E440 Moderate protein-calorie malnutrition: Secondary | ICD-10-CM | POA: Diagnosis not present

## 2016-05-17 DIAGNOSIS — Z888 Allergy status to other drugs, medicaments and biological substances status: Secondary | ICD-10-CM

## 2016-05-17 DIAGNOSIS — Z79899 Other long term (current) drug therapy: Secondary | ICD-10-CM

## 2016-05-17 DIAGNOSIS — C060 Malignant neoplasm of cheek mucosa: Secondary | ICD-10-CM | POA: Diagnosis present

## 2016-05-17 DIAGNOSIS — Z930 Tracheostomy status: Secondary | ICD-10-CM | POA: Diagnosis not present

## 2016-05-17 DIAGNOSIS — Z87891 Personal history of nicotine dependence: Secondary | ICD-10-CM

## 2016-05-17 DIAGNOSIS — Z8521 Personal history of malignant neoplasm of larynx: Secondary | ICD-10-CM

## 2016-05-17 DIAGNOSIS — Z853 Personal history of malignant neoplasm of breast: Secondary | ICD-10-CM

## 2016-05-17 DIAGNOSIS — F0390 Unspecified dementia without behavioral disturbance: Secondary | ICD-10-CM | POA: Diagnosis not present

## 2016-05-17 DIAGNOSIS — I10 Essential (primary) hypertension: Secondary | ICD-10-CM | POA: Diagnosis not present

## 2016-05-17 DIAGNOSIS — K921 Melena: Secondary | ICD-10-CM | POA: Diagnosis not present

## 2016-05-17 DIAGNOSIS — R918 Other nonspecific abnormal finding of lung field: Secondary | ICD-10-CM | POA: Diagnosis not present

## 2016-05-17 DIAGNOSIS — Z8619 Personal history of other infectious and parasitic diseases: Secondary | ICD-10-CM | POA: Diagnosis not present

## 2016-05-17 DIAGNOSIS — Z85818 Personal history of malignant neoplasm of other sites of lip, oral cavity, and pharynx: Secondary | ICD-10-CM

## 2016-05-17 DIAGNOSIS — R197 Diarrhea, unspecified: Secondary | ICD-10-CM | POA: Diagnosis not present

## 2016-05-17 DIAGNOSIS — E785 Hyperlipidemia, unspecified: Secondary | ICD-10-CM | POA: Diagnosis present

## 2016-05-17 DIAGNOSIS — Z803 Family history of malignant neoplasm of breast: Secondary | ICD-10-CM

## 2016-05-17 DIAGNOSIS — K5190 Ulcerative colitis, unspecified, without complications: Secondary | ICD-10-CM | POA: Diagnosis present

## 2016-05-17 LAB — COMPREHENSIVE METABOLIC PANEL
ALT: 13 U/L — AB (ref 14–54)
ANION GAP: 10 (ref 5–15)
AST: 17 U/L (ref 15–41)
Albumin: 3.7 g/dL (ref 3.5–5.0)
Alkaline Phosphatase: 88 U/L (ref 38–126)
BUN: 13 mg/dL (ref 6–20)
CALCIUM: 9 mg/dL (ref 8.9–10.3)
CHLORIDE: 93 mmol/L — AB (ref 101–111)
CO2: 31 mmol/L (ref 22–32)
CREATININE: 0.55 mg/dL (ref 0.44–1.00)
Glucose, Bld: 92 mg/dL (ref 65–99)
Potassium: 3.7 mmol/L (ref 3.5–5.1)
Sodium: 134 mmol/L — ABNORMAL LOW (ref 135–145)
Total Bilirubin: 0.7 mg/dL (ref 0.3–1.2)
Total Protein: 7.1 g/dL (ref 6.5–8.1)

## 2016-05-17 LAB — CBC WITH DIFFERENTIAL/PLATELET
BASOS PCT: 0 %
Basophils Absolute: 0 10*3/uL (ref 0.0–0.1)
EOS ABS: 0.3 10*3/uL (ref 0.0–0.7)
Eosinophils Relative: 2 %
HCT: 31.9 % — ABNORMAL LOW (ref 36.0–46.0)
HEMOGLOBIN: 10.7 g/dL — AB (ref 12.0–15.0)
LYMPHS ABS: 1.8 10*3/uL (ref 0.7–4.0)
Lymphocytes Relative: 10 %
MCH: 33.1 pg (ref 26.0–34.0)
MCHC: 33.5 g/dL (ref 30.0–36.0)
MCV: 98.8 fL (ref 78.0–100.0)
Monocytes Absolute: 1 10*3/uL (ref 0.1–1.0)
Monocytes Relative: 6 %
NEUTROS PCT: 82 %
Neutro Abs: 14.8 10*3/uL — ABNORMAL HIGH (ref 1.7–7.7)
Platelets: 277 10*3/uL (ref 150–400)
RBC: 3.23 MIL/uL — AB (ref 3.87–5.11)
RDW: 16.5 % — ABNORMAL HIGH (ref 11.5–15.5)
WBC: 17.9 10*3/uL — AB (ref 4.0–10.5)

## 2016-05-17 LAB — TYPE AND SCREEN
ABO/RH(D): A POS
ANTIBODY SCREEN: NEGATIVE

## 2016-05-17 LAB — PROTIME-INR
INR: 1.17
PROTHROMBIN TIME: 15 s (ref 11.4–15.2)

## 2016-05-17 LAB — APTT: APTT: 28 s (ref 24–36)

## 2016-05-17 LAB — POC OCCULT BLOOD, ED: Fecal Occult Bld: POSITIVE — AB

## 2016-05-17 MED ORDER — ONDANSETRON HCL 4 MG/2ML IJ SOLN
4.0000 mg | Freq: Once | INTRAMUSCULAR | Status: AC
  Administered 2016-05-17: 4 mg via INTRAVENOUS
  Filled 2016-05-17: qty 2

## 2016-05-17 MED ORDER — OSMOLITE 1.5 CAL PO LIQD
Freq: Four times a day (QID) | ORAL | Status: DC
  Filled 2016-05-17 (×2): qty 237

## 2016-05-17 MED ORDER — PREDNISONE 5 MG PO TABS: 5.0000 mg | ORAL_TABLET | Freq: Every day | ORAL | Status: DC

## 2016-05-17 MED ORDER — PREMIER PROTEIN SHAKE
11.0000 [oz_av] | Freq: Every day | ORAL | Status: DC
  Administered 2016-05-18: 11 [oz_av] via ORAL
  Filled 2016-05-17: qty 325.31

## 2016-05-17 MED ORDER — PRAVASTATIN SODIUM 20 MG PO TABS
40.0000 mg | ORAL_TABLET | Freq: Every day | ORAL | Status: DC
  Administered 2016-05-18: 20 mg via ORAL
  Administered 2016-05-18 – 2016-05-20 (×3): 40 mg via ORAL
  Filled 2016-05-17: qty 2
  Filled 2016-05-17: qty 1
  Filled 2016-05-17 (×3): qty 2

## 2016-05-17 MED ORDER — MESALAMINE ER 500 MG PO CPCR: 1000.0000 mg | ORAL_CAPSULE | Freq: Four times a day (QID) | ORAL | 3 refills | Status: DC

## 2016-05-17 MED ORDER — MORPHINE SULFATE (PF) 4 MG/ML IV SOLN
4.0000 mg | Freq: Once | INTRAVENOUS | Status: AC
  Administered 2016-05-17: 4 mg via INTRAVENOUS
  Filled 2016-05-17: qty 1

## 2016-05-17 MED ORDER — SODIUM CHLORIDE 0.9 % IV SOLN
INTRAVENOUS | Status: DC
  Administered 2016-05-20: 1000 mL via INTRAVENOUS

## 2016-05-17 MED ORDER — SODIUM CHLORIDE 0.9 % IV SOLN
INTRAVENOUS | Status: DC
  Administered 2016-05-17 – 2016-05-19 (×2): via INTRAVENOUS

## 2016-05-17 MED ORDER — LEVETIRACETAM 500 MG PO TABS
1500.0000 mg | ORAL_TABLET | Freq: Two times a day (BID) | ORAL | Status: DC
  Administered 2016-05-18 (×2): 1500 mg via ORAL
  Filled 2016-05-17 (×2): qty 3

## 2016-05-17 MED ORDER — OXYCODONE HCL 5 MG PO TABS
5.0000 mg | ORAL_TABLET | Freq: Three times a day (TID) | ORAL | Status: DC | PRN
  Administered 2016-05-18 (×2): 5 mg via ORAL
  Filled 2016-05-17 (×2): qty 1

## 2016-05-17 MED ORDER — LEVOTHYROXINE SODIUM 50 MCG PO TABS
50.0000 ug | ORAL_TABLET | Freq: Every evening | ORAL | Status: DC
  Administered 2016-05-18 – 2016-05-20 (×4): 50 ug via ORAL
  Filled 2016-05-17 (×5): qty 1

## 2016-05-17 MED ORDER — DIGOXIN 125 MCG PO TABS
0.1250 mg | ORAL_TABLET | Freq: Every day | ORAL | Status: DC
  Administered 2016-05-18 – 2016-05-21 (×4): 0.125 mg via ORAL
  Filled 2016-05-17 (×4): qty 1

## 2016-05-17 MED ORDER — DIPHENOXYLATE-ATROPINE 2.5-0.025 MG PO TABS
1.0000 | ORAL_TABLET | Freq: Two times a day (BID) | ORAL | Status: DC
  Administered 2016-05-18 – 2016-05-21 (×8): 1 via ORAL
  Filled 2016-05-17 (×8): qty 1

## 2016-05-17 MED ORDER — VANCOMYCIN 50 MG/ML ORAL SOLUTION
125.0000 mg | Freq: Four times a day (QID) | ORAL | Status: DC
  Filled 2016-05-17: qty 2.5

## 2016-05-17 MED ORDER — ACETYLCYSTEINE 20 % IN SOLN
1.0000 mL | Freq: Two times a day (BID) | RESPIRATORY_TRACT | Status: DC | PRN
  Filled 2016-05-17: qty 4

## 2016-05-17 MED ORDER — ADULT MULTIVITAMIN W/MINERALS CH: 1.0000 | ORAL_TABLET | Freq: Every day | ORAL | Status: DC

## 2016-05-17 MED ORDER — PHENOBARBITAL 32.4 MG PO TABS
97.2000 mg | ORAL_TABLET | Freq: Every morning | ORAL | Status: DC
  Administered 2016-05-18 – 2016-05-21 (×4): 97.2 mg via ORAL
  Filled 2016-05-17 (×4): qty 3

## 2016-05-17 MED ORDER — PREDNISONE 20 MG PO TABS
20.0000 mg | ORAL_TABLET | Freq: Every day | ORAL | Status: DC
  Administered 2016-05-18: 20 mg via ORAL
  Filled 2016-05-17: qty 1

## 2016-05-17 NOTE — Telephone Encounter (Signed)
Forwarding to PCP as FYI

## 2016-05-17 NOTE — ED Notes (Signed)
Pt requesting deep suctioning. Respiratory called.

## 2016-05-17 NOTE — ED Notes (Signed)
IV team at bedside 

## 2016-05-17 NOTE — ED Notes (Signed)
Pt placed on bedpan. Unable to provide stool specimen at this time.

## 2016-05-17 NOTE — Telephone Encounter (Signed)
Spoke with Dr. Hilarie Fredrickson and [facility] pharmacist and there is Pentasa that comes as a capsule that can be opened and the beads should be able to go through the NG tube. Per Dr. Hilarie Fredrickson pt should take Pentasa 1gram QID. No Rowasa now. Increase prednisone to 20mg  daily and check stool for cdiff. Spoke with pts husband and he is aware. Pts husband also wanted to let Dr. Hilarie Fredrickson know that she had labs done 05/10/16 and her Hgb was 9.6, Hct 28.4. Dr. Hilarie Fredrickson notified.

## 2016-05-17 NOTE — ED Provider Notes (Addendum)
[facility] DEPT Provider Note   CSN: [id number] Arrival date & time: 05/17/16  1456     History   Chief Complaint Chief Complaint  Patient presents with  . Blood In Stools    HPI GABIJA [name] is a 75 y.o. female.  HPI Pt started having increased bloody mucus stools the last few days.   She had a recent hospitalization on oct 31st for surgery for larygneal, oral cancer.  SHe was released about 1 week ago.  This was done at [facility].  She was having some of these issues in the hospital but it was not as severe.  Pt has history of UC as well and because of her difficulty eating she has not been taking her UC meds.  The family was concerned about her increasing bloody stools.  She seems weak.  Sx are getting worse.  Family called the PCP and was told to come to the ED.    Past Medical History:  Diagnosis Date  . 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 Anxiety   . Arthritis   . C. difficile colitis   . Cancer of larynx ([place])   . Cellulitis   . Cerebrovascular disease 2000  . Dementia   . Diverticulosis of colon (without mention of hemorrhage)   . Gait disorder   . H/O alcohol abuse   . History of UTI 10-2012  . Hyperlipidemia    takes Pravastatin daily  . Hypertension   . Hypothyroidism    takes Levothyroxine daily  . Joint pain   . Joint swelling   . Mild dysplasia of cervix   . 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 Peripheral vascular disease ([place])   . Permanent atrial fibrillation ([place])   . Personal history of colonic polyps    adenomatous 1997 & tubular adenoma and hyplastic  2008  . Pneumonia 2/16  . Redundant colon   . Seizures ([place])    takes Phenobarbital and Keppra daily;last seizure was April 1,2014  . 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 Stroke ([place]) 2000   off balance on right side a little and peripheral vision loss on right side  . Subdural hematoma ([place]) 1990s   s/p fall in bathtub  . Ulcerative colitis    takes [place] daily  . Urinary  incontinence   . UTI (lower urinary tract infection)     Patient Active Problem List   Diagnosis Date Noted  . Chronic bronchitis ([place]) 11/18/2015  . Osteoporosis of lower leg 04/13/2015  . Senile osteoporosis 04/13/2015  . 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 Malnutrition of moderate degree ([place]) 06/09/2014  . Prediabetes 02/10/2014  . Routine health maintenance 02/05/2013  . Chronic atrial fibrillation ([place]) 01/09/2013  . Encounter for therapeutic drug monitoring 07/19/2012  . Hemiplegia, unspecified, affecting dominant side 03/19/2012  . Seizure ([place]) 03/18/2012  . Larynx cancer ([place]) 08/18/2011  . Anemia, iron deficiency 05/06/2011  . UC (ulcerative colitis) ([place]) 01/10/2011  . Hypothyroidism 06/23/2007  . Hyperlipidemia with target LDL less than 100 06/23/2007  . Essential hypertension 06/23/2007  . URINARY INCONTINENCE 06/23/2007    Past Surgical History:  Procedure Laterality Date  . BREAST LUMPECTOMY     left breast with radiation therapy  . CAROTID ENDARTERECTOMY     left  . CATARACT EXTRACTION, BILATERAL    . COLONOSCOPY    . FLEXIBLE SIGMOIDOSCOPY N/A 06/21/2013   Procedure: FLEXIBLE SIGMOIDOSCOPY;  Surgeon: Jerene Bears, MD;  Location: [facility];  Service: Gastroenterology;  Laterality: N/A;  . HAMMER TOE SURGERY Right 03/2013   pins and screws  . HARDWARE REMOVAL Right 11/14/2013   Procedure: RIGHT SHOUDER HARDWARE REMOVAL;  Surgeon: Nita Sells, MD;  Location: [facility];  Service: Orthopedics;  Laterality: Right;  . INCISION AND DRAINAGE Right 11/14/2013   Procedure: INCISION AND DRAINAGE;  Surgeon: Nita Sells, MD;  Location: [facility];  Service: Orthopedics;  Laterality: Right;  . LARYNX SURGERY  08/2010   [facility] x 2  . MASTECTOMY     Right, history with nodule dissection  . MULTIPLE TOOTH EXTRACTIONS     due to oral cancer  . Oral Surgery for squamous cell carcinoma of the mouth     x 3  . ORIF HIP FRACTURE  Sept '12    Right hip: screw and plate repair.  . ORIF HUMERUS FRACTURE Right 10/30/2012   Procedure: OPEN REDUCTION INTERNAL FIXATION (ORIF) HUMERAL SHAFT FRACTURE;  Surgeon: Hessie Dibble, MD;  Location: [facility];  Service: Orthopedics;  Laterality: Right;  BIG C-ARM, SHOULDER POSITION  . SHOULDER SURGERY  10/2013  . SUBDURAL HEMATOMA EVACUATION VIA CRANIOTOMY    . TUBAL LIGATION      OB History    No data available       Home Medications    Prior to Admission medications   Medication Sig Start Date End Date Taking? Authorizing Provider  acetylcysteine (MUCOMYST) 10 % nebulizer solution Take 1-2 mLs by nebulization 2 (two) times daily as needed (for thick secretions in trachea).   Yes Historical Provider, MD  aspirin EC 81 MG tablet Take 81 mg by mouth daily with breakfast.   Yes Historical Provider, MD  Calcium Carbonate-Vitamin D (CALCIUM 500 + D PO) Take 1 tablet by mouth daily.   Yes Historical Provider, MD  digoxin (LANOXIN) 0.125 MG tablet TAKE 1 TABLET (125 MCG TOTAL) BY MOUTH DAILY. Patient taking differently: TAKE 1 TABLET (125 MCG TOTAL) BY MOUTH every morning 04/01/16  Yes Janith Lima, MD  diphenoxylate-atropine (LOMOTIL) 2.5-0.025 MG tablet TAKE 1 TABLET BY MOUTH 4 TIMES A DAY AS NEEDED FOR DIARRHEA OR LOOSE STOOLS Patient taking differently: TAKE 1 TABLET BY MOUTH twice per day 03/16/16  Yes Jerene Bears, MD  furosemide (LASIX) 20 MG tablet TAKE 1 TABLET EVERY DAY Patient taking differently: TAKE 1 TABLEt by mouth every morning 09/04/15  Yes Janith Lima, MD  levETIRAcetam (KEPPRA) 750 MG tablet Take 2 tablets (1,500 mg total) by mouth 2 (two) times daily. 10/19/15  Yes Kathrynn Ducking, MD  levothyroxine (SYNTHROID, LEVOTHROID) 50 MCG tablet TAKE 1 TABLET BY MOUTH ONCE EVERY EVENING 12/29/15  Yes Janith Lima, MD  Mesalamine 800 MG TBEC Take 6 tablets (4,800 mg total) by mouth daily. 11/17/15  Yes Jerene Bears, MD  Multiple Vitamin (MULTIVITAMIN WITH MINERALS) TABS Take 1 tablet by  mouth daily with breakfast.    Yes Historical Provider, MD  Nutritional Supplements (OSMOLITE) LIQD Take by mouth 4 (four) times daily.   Yes Historical Provider, MD  Nutritional Supplements (PROMOD) LIQD Take 1 Bottle by mouth daily.   Yes Historical Provider, MD  oxyCODONE (OXY IR/ROXICODONE) 5 MG immediate release tablet Take 5 mg by mouth 3 (three) times daily as needed for pain. 05/12/16  Yes Historical Provider, MD  PHENObarbital (LUMINAL) 100 MG tablet TAKE 1 TABLET BY MOUTH EVERY DAY AT BEDTIME Patient taking differently: TAKE 1 TABLET BY MOUTH EVERY morning 12/28/15  Yes Janith Lima,  MD  pravastatin (PRAVACHOL) 40 MG tablet TAKE 1 TABLET (40 MG TOTAL) BY MOUTH EVERY EVENING. 12/29/15  Yes Janith Lima, MD  predniSONE (DELTASONE) 5 MG tablet TAKE 1 TABLET (5 MG TOTAL) BY MOUTH DAILY WITH BREAKFAST. 03/28/16  Yes Jerene Bears, MD  mesalamine (PENTASA) 500 MG CR capsule Take 2 capsules (1,000 mg total) by mouth 4 (four) times daily. Patient not taking: Reported on 05/17/2016 05/17/16   Jerene Bears, MD  predniSONE (DELTASONE) 10 MG tablet Take 1 tablet (10 mg total) by mouth daily with breakfast. Patient not taking: Reported on 05/17/2016 04/08/16   Gatha Mayer, MD  vancomycin (VANCOCIN) 125 MG capsule Take 1 capsule (125 mg total) by mouth 4 (four) times daily. Patient not taking: Reported on 05/17/2016 04/12/16   Gatha Mayer, MD    Family History Family History  Problem Relation Age of Onset  . Coronary artery disease Mother   . Heart disease Mother   . Diabetes Sister   . Breast cancer Maternal Aunt   . Breast cancer Sister   . Colon cancer      nephew (sister's son)    Social History Social History  Substance Use Topics  . Smoking status: Former Smoker    Years: 40.00    Quit date: 10/26/2004  . Smokeless tobacco: Never Used     Comment: quit in 2007  . Alcohol use 0.0 oz/week     Comment: normally 1 glass wine after dinner - occasional     Allergies   Xarelto  [rivaroxaban] and Diltiazem   Review of Systems Review of Systems  Constitutional: Negative for fever.  Gastrointestinal: Positive for blood in stool. Negative for vomiting.  All other systems reviewed and are negative.    Physical Exam Updated Vital Signs BP 129/98 (BP Location: Left Arm)   Pulse 87   Resp 20   SpO2 99%   Physical Exam  Constitutional: No distress.  HENT:  Head: Normocephalic and atraumatic.  Right Ear: External ear normal.  Left Ear: External ear normal.  Status post oral surgery, gastric tube in place  Eyes: Conjunctivae are normal. Right eye exhibits no discharge. Left eye exhibits no discharge. No scleral icterus.  Neck: Neck supple. No tracheal deviation present.  Cardiovascular: Normal rate, regular rhythm and intact distal pulses.   Pulmonary/Chest: Effort normal and breath sounds normal. No stridor. No respiratory distress. She has no wheezes. She has no rales.  Abdominal: Soft. Bowel sounds are normal. She exhibits no distension. There is no tenderness. There is no rebound and no guarding.  Genitourinary:  Genitourinary Comments: No gross blood on rectal exam, no masses  Musculoskeletal: She exhibits no edema or tenderness.  Neurological: She is alert. No cranial nerve deficit (no facial droop, extraocular movements intact, no slurred speech) or sensory deficit. She exhibits normal muscle tone. She displays no seizure activity. Coordination normal.  Generalized weakness  Skin: Skin is warm and dry. No rash noted.  Psychiatric: She has a normal mood and affect.  Nursing note and vitals reviewed.    ED Treatments / Results  Labs (all labs ordered are listed, but only abnormal results are displayed) Labs Reviewed  COMPREHENSIVE METABOLIC PANEL - Abnormal; Notable for the following:       Result Value   Sodium 134 (*)    Chloride 93 (*)    ALT 13 (*)    All other components within normal limits  CBC WITH DIFFERENTIAL/PLATELET - Abnormal;  Notable for  the following:    WBC 17.9 (*)    RBC 3.23 (*)    Hemoglobin 10.7 (*)    HCT 31.9 (*)    RDW 16.5 (*)    Neutro Abs 14.8 (*)    All other components within normal limits  POC OCCULT BLOOD, ED - Abnormal; Notable for the following:    Fecal Occult Bld POSITIVE (*)    All other components within normal limits  GASTROINTESTINAL PANEL BY PCR, STOOL (REPLACES STOOL CULTURE)  APTT  PROTIME-INR  TYPE AND SCREEN    Radiology Dg Chest Portable 1 View  Result Date: 05/17/2016 CLINICAL DATA:  One episode of bloody/mucus stool since waking this morning. EXAM: PORTABLE CHEST 1 VIEW COMPARISON:  11/25/2015 FINDINGS: The cardiac silhouette is top-normal in size. There is aortic atherosclerosis without aneurysm. Gastric/feeding tube extends below the left hemidiaphragm. The lungs are slightly hyperinflated without acute pneumonic consolidation, CHF nor effusion. Old healing right-sided fifth and sixth rib fractures are noted posteriorly. Bilateral axillary clips are identified with old posttraumatic deformity of the right humeral head and ghost tracks along the proximal right humeral shaft. IMPRESSION: Slight hyperinflation of the lungs without acute pneumonic consolidation or CHF. Feeding tube/gastric tube extends below the left hemidiaphragm. Healing right posterior fifth and sixth rib fractures. Old chronic deformity of the right humeral head with ghost tracks in the proximal humeral shaft. Electronically Signed   By: Ashley Royalty M.D.   On: 05/17/2016 17:08    Procedures Procedures (including critical care time)  Medications Ordered in ED Medications  0.9 %  sodium chloride infusion ( Intravenous New Bag/Given 05/17/16 1843)  ondansetron (ZOFRAN) injection 4 mg (4 mg Intravenous Given 05/17/16 1843)  morphine 4 MG/ML injection 4 mg (4 mg Intravenous Given 05/17/16 2021)     Initial Impression / Assessment and Plan / ED Course  I have reviewed the triage vital signs and the nursing  notes.  Pertinent labs & imaging results that were available during my care of the patient were reviewed by me and considered in my medical decision making (see chart for details).  Clinical Course as of May 18 2131  Tue May 17, 2016  1853 Delay in obtaining labs on the patient.  Vitals are stable  [JK]  2131 I spoke with Dr. Ardis Hughs concerning the patient.  He does not recommended. Antibiotic treatment at this point. Her symptoms can certainly be related to either an exacerbation of her colitis, diverticular bleeding or possibly related to Clostridium difficile.  Right now the patient is hemodynamically stable. He recommends admission. GI will consult in the morning.  [JK]    Clinical Course User Index [JK] Dorie Rank, MD    The patient's laboratory tests show an elevated white blood cell count. She has a stable anemia compared to her hospitalization. Electrolyte panel does not show any evidence of severe dehydration. She does have history of C. difficile as well as diverticulosis. I think either of these Conditions could be causing the rectal bleeding. She is certainly at risk considering her recent hospitalization. I sent off a stool panel. I will consult with gastroenterology  since she spoke with Dr. Hilarie Fredrickson earlier today. I will consult the medical service for admission.  Final Clinical Impressions(s) / ED Diagnoses   Final diagnoses:  Rectal bleeding  Leukocytosis, unspecified type       Dorie Rank, MD 05/17/16 2132

## 2016-05-17 NOTE — ED Notes (Signed)
Pt requesting pain medication. MD made aware. 

## 2016-05-17 NOTE — ED Notes (Signed)
Respiratory at bedside.

## 2016-05-17 NOTE — Telephone Encounter (Signed)
Pts husband states pt had surgery at [facility] 05/03/16, she had part of her tongue removed, part of her jawbone, and a piece out of her mouth about the size of a 50 cent piece removed. Pt has an NG tube in place. States she is having urgency, diarrhea stools with BRB in them. She is still taking prednisone 5mg  daily, lomotil bid. She has not had her mesalamine since 05/03/16, husband states he cannot crush it and give through NG tube. States she has had 6-7 diarrhea stools since midnight. Dr. Hilarie Fredrickson please advise.

## 2016-05-17 NOTE — ED Triage Notes (Signed)
Per [facility] personnel pt and pt husband reports pt having one episode of bloody/mucousy stool upon waking this AM. Pt was recently discharged from another hospital. Pt reports having occasional bloody/mucousy stools. Pt has extensive colon hx including cancer.

## 2016-05-17 NOTE — ED Notes (Signed)
Nurse called IV for patient

## 2016-05-17 NOTE — ED Notes (Signed)
Pt being sent by PCP. C/o decreased intake.  Recent oral CA surgery.  Hx of feeding tube.

## 2016-05-17 NOTE — Telephone Encounter (Signed)
Increase prednisone to 20 mg daily Check C diff PCR Rowasa enema can be used at night if she can retain. I am not aware of any mesalamine that can be crushed and this medication has been very effective for her in the past.  Plan to restart as soon as she can take PO

## 2016-05-17 NOTE — Progress Notes (Signed)
RT called to bedside for suctioning of Stoma but when arrived to bedside Pt's husband stated that she had a good productive cough and didn't require suctioning at this time.  Pt's husband requesting assistance of cleaning some dried secretions from around pt's stoma and HME. RT provided assistance and Pt is satisfied at this time. Pt currently on 4 LPM via Trach collar from home and tolerating well at this time.  RT to monitor and assess as needed.

## 2016-05-17 NOTE — Telephone Encounter (Signed)
Spouse called in stating wife just got home from [facility] from having surgery to remove mouth cancer.  States patient is very weak and can not sit up on her own.  States she is currently on oxygen and a feeding tube.  States she is bleeding around the mouth and has had diarrhea for over one day.  I advised spouse to take patient to ED.  Spouse is going to call EMS out to transport patient to [facility].  I did call ED to notify them that the patient will be coming.

## 2016-05-17 NOTE — H&P (Signed)
History and Physical    [name] [id number] DOB: 1941/03/31 DOA: 05/17/2016   PCP: Scarlette Calico, MD Chief Complaint:  Chief Complaint  Patient presents with  . Blood In Stools    HPI: [name] is a 75 y.o. female with medical history significant of UC, C.Diff in October, recent hospital admit to [facility] oct 31st for surgery for oral cancer.  Patient released about 1 week ago, repeat C.diff testing on 11/7 was negative though she was having diarrhea on discharge.  Past couple of days she has had increasing diarrhea which ultimately turned bloody.  She has been sent in to the ED.  Regarding her UC: patient is now being fed through a feeding tube, and as such due to her melsalamine being an extended release tab that cannot be crushed she has been off of this since Oct 30th.  She has only been taking prednisone 5mg  daily for UC.  Before coming to ED today they called Dr. Vena Rua office who recommended increasing prednisone to 20mg  daily.  ED Course: Stool sample sent,  WBC 17k.  HGB 10.7 which is actually up from 9.6 at time of discharge from [facility] last week.  Review of Systems: As per HPI otherwise 10 point review of systems negative.    Past Medical History:  Diagnosis Date  . 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 Anxiety   . Arthritis   . C. difficile colitis   . Cancer of larynx ([place])   . Cellulitis   . Cerebrovascular disease 2000  . Dementia   . Diverticulosis of colon (without mention of hemorrhage)   . Gait disorder   . H/O alcohol abuse   . History of UTI 10-2012  . Hyperlipidemia    takes Pravastatin daily  . Hypertension   . Hypothyroidism    takes Levothyroxine daily  . Joint pain   . Joint swelling   . Mild dysplasia of cervix   . 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 Peripheral vascular disease ([place])   . Permanent atrial fibrillation ([place])   . Personal history of colonic polyps    adenomatous 1997 & tubular adenoma and hyplastic  2008    . Pneumonia 2/16  . Redundant colon   . Seizures ([place])    takes Phenobarbital and Keppra daily;last seizure was April 1,2014  . 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 Stroke ([place]) 2000   off balance on right side a little and peripheral vision loss on right side  . Subdural hematoma ([place]) 1990s   s/p fall in bathtub  . Ulcerative colitis    takes [place] daily  . Urinary incontinence   . UTI (lower urinary tract infection)     Past Surgical History:  Procedure Laterality Date  . BREAST LUMPECTOMY     left breast with radiation therapy  . CAROTID ENDARTERECTOMY     left  . CATARACT EXTRACTION, BILATERAL    . COLONOSCOPY    . FLEXIBLE SIGMOIDOSCOPY N/A 06/21/2013   Procedure: FLEXIBLE SIGMOIDOSCOPY;  Surgeon: Jerene Bears, MD;  Location: [facility];  Service: Gastroenterology;  Laterality: N/A;  . HAMMER TOE SURGERY Right 03/2013   pins and screws  . HARDWARE REMOVAL Right 11/14/2013   Procedure: RIGHT SHOUDER HARDWARE REMOVAL;  Surgeon: Nita Sells, MD;  Location: [place];  Service: Orthopedics;  Laterality: Right;  . INCISION AND DRAINAGE Right 11/14/2013   Procedure: INCISION AND DRAINAGE;  Surgeon: Nita Sells, MD;  Location:  [place] [facility];  Service: Orthopedics;  Laterality: Right;  . LARYNX SURGERY  08/2010   [facility] x 2  . MASTECTOMY     Right, history with nodule dissection  . MULTIPLE TOOTH EXTRACTIONS     due to oral cancer  . Oral Surgery for squamous cell carcinoma of the mouth     x 3  . ORIF HIP FRACTURE  Sept '12   Right hip: screw and plate repair.  . ORIF HUMERUS FRACTURE Right 10/30/2012   Procedure: OPEN REDUCTION INTERNAL FIXATION (ORIF) HUMERAL SHAFT FRACTURE;  Surgeon: Hessie Dibble, MD;  Location: [place];  Service: Orthopedics;  Laterality: Right;  BIG C-ARM, SHOULDER POSITION  . SHOULDER SURGERY  10/2013  . SUBDURAL HEMATOMA EVACUATION VIA CRANIOTOMY    . TUBAL LIGATION       reports that she quit smoking about 11 years ago. She  quit after 40.00 years of use. She has never used smokeless tobacco. She reports that she drinks alcohol. She reports that she does not use drugs.  Allergies  Allergen Reactions  . Xarelto [Rivaroxaban]     Suffered a severe bleed  . Diltiazem Other (See Comments)    edema    Family History  Problem Relation Age of Onset  . Coronary artery disease Mother   . Heart disease Mother   . Diabetes Sister   . Breast cancer Maternal Aunt   . Breast cancer Sister   . Colon cancer      nephew (sister's son)      Prior to Admission medications   Medication Sig Start Date End Date Taking? Authorizing Provider  acetylcysteine (MUCOMYST) 10 % nebulizer solution Take 1-2 mLs by nebulization 2 (two) times daily as needed (for thick secretions in trachea).   Yes Historical Provider, MD  aspirin EC 81 MG tablet Take 81 mg by mouth daily with breakfast.   Yes Historical Provider, MD  Calcium Carbonate-Vitamin D (CALCIUM 500 + D PO) Take 1 tablet by mouth daily.   Yes Historical Provider, MD  digoxin (LANOXIN) 0.125 MG tablet TAKE 1 TABLET (125 MCG TOTAL) BY MOUTH DAILY. Patient taking differently: TAKE 1 TABLET (125 MCG TOTAL) BY MOUTH every morning 04/01/16  Yes Janith Lima, MD  diphenoxylate-atropine (LOMOTIL) 2.5-0.025 MG tablet TAKE 1 TABLET BY MOUTH 4 TIMES A DAY AS NEEDED FOR DIARRHEA OR LOOSE STOOLS Patient taking differently: TAKE 1 TABLET BY MOUTH twice per day 03/16/16  Yes Jerene Bears, MD  furosemide (LASIX) 20 MG tablet TAKE 1 TABLET EVERY DAY Patient taking differently: TAKE 1 TABLEt by mouth every morning 09/04/15  Yes Janith Lima, MD  levETIRAcetam (KEPPRA) 750 MG tablet Take 2 tablets (1,500 mg total) by mouth 2 (two) times daily. 10/19/15  Yes Kathrynn Ducking, MD  levothyroxine (SYNTHROID, LEVOTHROID) 50 MCG tablet TAKE 1 TABLET BY MOUTH ONCE EVERY EVENING 12/29/15  Yes Janith Lima, MD  Mesalamine 800 MG TBEC Take 6 tablets (4,800 mg total) by mouth daily. 11/17/15  Yes Jerene Bears, MD  Multiple Vitamin (MULTIVITAMIN WITH MINERALS) TABS Take 1 tablet by mouth daily with breakfast.    Yes Historical Provider, MD  Nutritional Supplements (OSMOLITE) LIQD Take by mouth 4 (four) times daily.   Yes Historical Provider, MD  Nutritional Supplements (PROMOD) LIQD Take 1 Bottle by mouth daily.   Yes Historical Provider, MD  oxyCODONE (OXY IR/ROXICODONE) 5 MG immediate release tablet Take 5 mg by mouth 3 (three) times daily as needed for pain.  05/12/16  Yes Historical Provider, MD  PHENObarbital (LUMINAL) 100 MG tablet TAKE 1 TABLET BY MOUTH EVERY DAY AT BEDTIME Patient taking differently: TAKE 1 TABLET BY MOUTH EVERY morning 12/28/15  Yes Janith Lima, MD  pravastatin (PRAVACHOL) 40 MG tablet TAKE 1 TABLET (40 MG TOTAL) BY MOUTH EVERY EVENING. 12/29/15  Yes Janith Lima, MD  predniSONE (DELTASONE) 5 MG tablet TAKE 1 TABLET (5 MG TOTAL) BY MOUTH DAILY WITH BREAKFAST. 03/28/16  Yes Jerene Bears, MD    Physical Exam: Vitals:   05/17/16 1719 05/17/16 1945 05/17/16 2021 05/17/16 2022  BP: 110/65  129/98 129/98  Pulse: 90 92 87 82  Resp: 16 20 20    SpO2: 93% 100% 99% 97%      Constitutional: NAD, calm, comfortable Eyes: PERRL, lids and conjunctivae normal ENMT: Mucous membranes are moist. S/P surgery, NGT in place, no obvious exudate or erythema in PO cavity though she did have trouble with wound dehiscence in the R lower jaw. Neck: normal, supple, no masses, no thyromegaly Respiratory: clear to auscultation bilaterally, no wheezing, no crackles. Normal respiratory effort. No accessory muscle use.  Cardiovascular: Regular rate and rhythm, no murmurs / rubs / gallops. No extremity edema. 2+ pedal pulses. No carotid bruits.  Abdomen: no tenderness, no masses palpated. No hepatosplenomegaly. Bowel sounds positive.  Musculoskeletal: no clubbing / cyanosis. No joint deformity upper and lower extremities. Good ROM, no contractures. Normal muscle tone.  Skin: no rashes, lesions,  ulcers. No induration Neurologic: CN 2-12 grossly intact. Sensation intact, DTR normal. Strength 5/5 in all 4.  Psychiatric: Normal judgment and insight. Alert and oriented x 3. Normal mood.    Labs on Admission: I have personally reviewed following labs and imaging studies  CBC:  Recent Labs Lab 05/17/16 1843  WBC 17.9*  NEUTROABS 14.8*  HGB 10.7*  HCT 31.9*  MCV 98.8  PLT 99991111   Basic Metabolic Panel:  Recent Labs Lab 05/17/16 1843  NA 134*  K 3.7  CL 93*  CO2 31  GLUCOSE 92  BUN 13  CREATININE 0.55  CALCIUM 9.0   GFR: CrCl cannot be calculated (Unknown ideal weight.). Liver Function Tests:  Recent Labs Lab 05/17/16 1843  AST 17  ALT 13*  ALKPHOS 88  BILITOT 0.7  PROT 7.1  ALBUMIN 3.7   No results for input(s): LIPASE, AMYLASE in the last 168 hours. No results for input(s): AMMONIA in the last 168 hours. Coagulation Profile:  Recent Labs Lab 05/17/16 1843  INR 1.17   Cardiac Enzymes: No results for input(s): CKTOTAL, CKMB, CKMBINDEX, TROPONINI in the last 168 hours. BNP (last 3 results) No results for input(s): PROBNP in the last 8760 hours. HbA1C: No results for input(s): HGBA1C in the last 72 hours. CBG: No results for input(s): GLUCAP in the last 168 hours. Lipid Profile: No results for input(s): CHOL, HDL, LDLCALC, TRIG, CHOLHDL, LDLDIRECT in the last 72 hours. Thyroid Function Tests: No results for input(s): TSH, T4TOTAL, FREET4, T3FREE, THYROIDAB in the last 72 hours. Anemia Panel: No results for input(s): VITAMINB12, FOLATE, FERRITIN, TIBC, IRON, RETICCTPCT in the last 72 hours. Urine analysis:    Component Value Date/Time   COLORURINE YELLOW 02/11/2015 [place] 02/11/2015 1239   LABSPEC 1.015 02/11/2015 1239   PHURINE 5.5 02/11/2015 1239   GLUCOSEU NEGATIVE 02/11/2015 1239   HGBUR NEGATIVE 02/11/2015 1239   BILIRUBINUR NEGATIVE 02/11/2015 1239   BILIRUBINUR negative 09/06/2013 [place]  02/11/2015 1239   PROTEINUR  300 09/06/2013 0942   PROTEINUR 100 (A) 10/02/2012 1649   UROBILINOGEN 0.2 02/11/2015 1239   NITRITE NEGATIVE 02/11/2015 1239   LEUKOCYTESUR NEGATIVE 02/11/2015 1239   Sepsis Labs: @LABRCNTIP (procalcitonin:4,lacticidven:4) )No results found for this or any previous visit (from the past 240 hour(s)).   Radiological Exams on Admission: Dg Chest Portable 1 View  Result Date: 05/17/2016 CLINICAL DATA:  One episode of bloody/mucus stool since waking this morning. EXAM: PORTABLE CHEST 1 VIEW COMPARISON:  11/25/2015 FINDINGS: The cardiac silhouette is top-normal in size. There is aortic atherosclerosis without aneurysm. Gastric/feeding tube extends below the left hemidiaphragm. The lungs are slightly hyperinflated without acute pneumonic consolidation, CHF nor effusion. Old healing right-sided fifth and sixth rib fractures are noted posteriorly. Bilateral axillary clips are identified with old posttraumatic deformity of the right humeral head and ghost tracks along the proximal right humeral shaft. IMPRESSION: Slight hyperinflation of the lungs without acute pneumonic consolidation or CHF. Feeding tube/gastric tube extends below the left hemidiaphragm. Healing right posterior fifth and sixth rib fractures. Old chronic deformity of the right humeral head with ghost tracks in the proximal humeral shaft. Electronically Signed   By: Ashley Royalty M.D.   On: 05/17/2016 17:08    EKG: Independently reviewed.  Assessment/Plan Principal Problem:   Rectal bleeding Active Problems:   UC (ulcerative colitis) ([place])   Larynx cancer ([place])    1. Rectal bleeding- DDx includes UC flare up due to not being on melsalamine, recurrent C.Diff, vs other (diverticular bleed, AVM, etc). 1. IVF 2. Repeat CBC / BMP in AM 3. Stool studies ordered, of note stool was negative for C.Diff on 11/7 (1 week ago) at [facility], was positive for C.Diff back in October but this cleared up after a 10 day course  of PO vanc. 2. UC - 1. Increase prednisone to 20mg  daily 3. Larynx cancer - 1. Continue tube feeding 4. Leukocytosis - 1. Trend on CBC 2. Holding off on treatment for the moment pending C.Diff study 3. No other SIRS 4. Keep an eye on oral mucosa (had trouble with wound dehiscence of R lower jaw it sounds like) but dosent look grossly infected right now on my exam.   DVT prophylaxis: SCDs Code Status: Full Family Communication: Husband at bedside Consults called: EDP spoke with Dr. Ardis Hughs with GI, they will consult in AM Admission status: Admit to inpatient   Etta Quill DO [facility] Pager [phone] from 7PM-7AM  If 7AM-7PM, please contact the day physician for the patient [email or web address] Password [email or web address]  05/17/2016, 10:10 PM

## 2016-05-17 NOTE — ED Notes (Signed)
Unsuccessful IV attempt. Charge nurse asked to attempt.

## 2016-05-18 ENCOUNTER — Telehealth: Payer: Self-pay | Admitting: Internal Medicine

## 2016-05-18 ENCOUNTER — Encounter (HOSPITAL_COMMUNITY): Payer: Self-pay

## 2016-05-18 DIAGNOSIS — E039 Hypothyroidism, unspecified: Secondary | ICD-10-CM | POA: Diagnosis not present

## 2016-05-18 DIAGNOSIS — E43 Unspecified severe protein-calorie malnutrition: Secondary | ICD-10-CM

## 2016-05-18 DIAGNOSIS — K625 Hemorrhage of anus and rectum: Secondary | ICD-10-CM | POA: Diagnosis not present

## 2016-05-18 DIAGNOSIS — Z8619 Personal history of other infectious and parasitic diseases: Secondary | ICD-10-CM | POA: Diagnosis not present

## 2016-05-18 DIAGNOSIS — K51911 Ulcerative colitis, unspecified with rectal bleeding: Secondary | ICD-10-CM | POA: Diagnosis not present

## 2016-05-18 DIAGNOSIS — E785 Hyperlipidemia, unspecified: Secondary | ICD-10-CM | POA: Diagnosis not present

## 2016-05-18 DIAGNOSIS — C329 Malignant neoplasm of larynx, unspecified: Secondary | ICD-10-CM | POA: Diagnosis not present

## 2016-05-18 DIAGNOSIS — D72829 Elevated white blood cell count, unspecified: Secondary | ICD-10-CM | POA: Diagnosis not present

## 2016-05-18 DIAGNOSIS — R1310 Dysphagia, unspecified: Secondary | ICD-10-CM

## 2016-05-18 LAB — BASIC METABOLIC PANEL
Anion gap: 7 (ref 5–15)
BUN: 16 mg/dL (ref 6–20)
CALCIUM: 8.7 mg/dL — AB (ref 8.9–10.3)
CO2: 31 mmol/L (ref 22–32)
CREATININE: 0.5 mg/dL (ref 0.44–1.00)
Chloride: 97 mmol/L — ABNORMAL LOW (ref 101–111)
Glucose, Bld: 123 mg/dL — ABNORMAL HIGH (ref 65–99)
Potassium: 3.5 mmol/L (ref 3.5–5.1)
SODIUM: 135 mmol/L (ref 135–145)

## 2016-05-18 LAB — CBC
HCT: 29.6 % — ABNORMAL LOW (ref 36.0–46.0)
Hemoglobin: 9.9 g/dL — ABNORMAL LOW (ref 12.0–15.0)
MCH: 33 pg (ref 26.0–34.0)
MCHC: 33.4 g/dL (ref 30.0–36.0)
MCV: 98.7 fL (ref 78.0–100.0)
PLATELETS: 257 10*3/uL (ref 150–400)
RBC: 3 MIL/uL — ABNORMAL LOW (ref 3.87–5.11)
RDW: 16.6 % — AB (ref 11.5–15.5)
WBC: 14.6 10*3/uL — AB (ref 4.0–10.5)

## 2016-05-18 LAB — C DIFFICILE QUICK SCREEN W PCR REFLEX
C DIFFICLE (CDIFF) ANTIGEN: NEGATIVE
C Diff interpretation: NOT DETECTED
C Diff toxin: NEGATIVE

## 2016-05-18 LAB — GASTROINTESTINAL PANEL BY PCR, STOOL (REPLACES STOOL CULTURE)
ASTROVIRUS: NOT DETECTED
Adenovirus F40/41: NOT DETECTED
Campylobacter species: NOT DETECTED
Cryptosporidium: NOT DETECTED
Cyclospora cayetanensis: NOT DETECTED
ENTAMOEBA HISTOLYTICA: NOT DETECTED
ENTEROAGGREGATIVE E COLI (EAEC): NOT DETECTED
ENTEROPATHOGENIC E COLI (EPEC): NOT DETECTED
ENTEROTOXIGENIC E COLI (ETEC): NOT DETECTED
GIARDIA LAMBLIA: NOT DETECTED
NOROVIRUS GI/GII: NOT DETECTED
Plesimonas shigelloides: NOT DETECTED
Rotavirus A: NOT DETECTED
SHIGA LIKE TOXIN PRODUCING E COLI (STEC): NOT DETECTED
Salmonella species: NOT DETECTED
Sapovirus (I, II, IV, and V): NOT DETECTED
Shigella/Enteroinvasive E coli (EIEC): NOT DETECTED
VIBRIO CHOLERAE: NOT DETECTED
Vibrio species: NOT DETECTED
Yersinia enterocolitica: NOT DETECTED

## 2016-05-18 LAB — ABO/RH: ABO/RH(D): A POS

## 2016-05-18 MED ORDER — PREDNISONE 5 MG PO TABS: 5.0000 mg | ORAL_TABLET | Freq: Every day | ORAL | Status: DC

## 2016-05-18 MED ORDER — VANCOMYCIN 50 MG/ML ORAL SOLUTION
500.0000 mg | Freq: Four times a day (QID) | ORAL | Status: DC
  Administered 2016-05-18 (×2): 500 mg via ORAL
  Filled 2016-05-18 (×4): qty 10

## 2016-05-18 MED ORDER — PREDNISONE 20 MG PO TABS: 20.0000 mg | ORAL_TABLET | Freq: Every day | ORAL | Status: DC

## 2016-05-18 MED ORDER — BALSALAZIDE DISODIUM 750 MG PO CAPS
2250.0000 mg | ORAL_CAPSULE | Freq: Three times a day (TID) | ORAL | Status: DC
  Administered 2016-05-18 – 2016-05-21 (×8): 2250 mg via ORAL
  Filled 2016-05-18 (×9): qty 3

## 2016-05-18 MED ORDER — OXYCODONE HCL 5 MG/5ML PO SOLN
5.0000 mg | Freq: Three times a day (TID) | ORAL | Status: DC | PRN
  Administered 2016-05-18: 5 mg via ORAL
  Filled 2016-05-18: qty 5

## 2016-05-18 MED ORDER — ADULT MULTIVITAMIN LIQUID CH
15.0000 mL | Freq: Every day | ORAL | Status: DC
  Administered 2016-05-18 – 2016-05-21 (×4): 15 mL
  Filled 2016-05-18 (×4): qty 15

## 2016-05-18 MED ORDER — OSMOLITE 1.5 CAL PO LIQD
237.0000 mL | Freq: Four times a day (QID) | ORAL | Status: DC
  Administered 2016-05-18 – 2016-05-19 (×5): 237 mL
  Filled 2016-05-18 (×8): qty 237

## 2016-05-18 MED ORDER — LEVETIRACETAM 100 MG/ML PO SOLN
1500.0000 mg | Freq: Two times a day (BID) | ORAL | Status: DC
  Administered 2016-05-18 – 2016-05-21 (×6): 1500 mg
  Filled 2016-05-18 (×6): qty 15

## 2016-05-18 MED ORDER — OXYCODONE HCL 5 MG/5ML PO SOLN
5.0000 mg | ORAL | Status: DC | PRN
  Administered 2016-05-18 – 2016-05-20 (×5): 10 mg via ORAL
  Filled 2016-05-18 (×5): qty 10

## 2016-05-18 MED ORDER — METHYLPREDNISOLONE SODIUM SUCC 40 MG IJ SOLR
20.0000 mg | Freq: Two times a day (BID) | INTRAMUSCULAR | Status: DC
  Administered 2016-05-18 – 2016-05-21 (×6): 20 mg via INTRAVENOUS
  Filled 2016-05-18 (×6): qty 1

## 2016-05-18 NOTE — Progress Notes (Signed)
Rt completed stoma ck. Pt not needing suction at this time.

## 2016-05-18 NOTE — Telephone Encounter (Signed)
Spoke with pts husband and she was admitted last night to [facility]. He wanted to let Dr. Hilarie Fredrickson know that the Pentasa that was sent in yesterday for the pt was not covered by their insurance. Dr. Hilarie Fredrickson notified.

## 2016-05-18 NOTE — Progress Notes (Signed)
[facility] PROGRESS NOTE  [name] [id number] DOB: September 05, 1940 DOA: 05/17/2016 PCP: Scarlette Calico, MD  Interim summary and HPI 75 y.o. female with medical history significant of UC, C.Diff in October, recent hospital admit to [facility] oct 31st for surgery for oral cancer.  Patient released about 1 week ago, repeat C.diff testing on 11/7 was negative though she was having diarrhea on discharge.  Past couple of days she has had increasing diarrhea which ultimately turned bloody. She has been sent in to the ED. Regarding her UC: patient is now being fed through a feeding tube, and as such due to her melsalamine being an extended release tab that cannot be crushed she has been off of this since Oct 30th.  She has only been taking prednisone 5mg  daily for UC.  Before coming to ED today they called Dr. Vena Rua office who recommended increasing prednisone to 20mg  daily.  Assessment/Plan: 1-Rectal bleeding: -appears to be secondary to IBD flare (UC) -will continue steroids as recommended by GI (now on solumedrol BID) -now on balsalazide as recommended by GI -will follow rec's -will follow Hgb trend   2-s/p oral cancer surgery -tube feedings dependent currently -will have SPL evaluation -resume tube feedings as per nutritional service   3-HLD: -continue statins  4-hx of seizure disorder: -will continue keppra and phenobarbital  5-hypothyroidism -will continue synthroid   6-severe protein calorie malnutrition -continue tube feedings for now  Code Status:Full Family Communication: no family at bedside  Disposition Plan: remains inpatient; will follow GI rec's; started back on tube feedings. Will hold on PO vanc as neg C. Diff retrieved. Patient will be continued on IV solumedrol as recommended GI.   Consultants:  GI  Procedures:  See below for x-ray reports   Antibiotics:  Vancomycin PO (for potential C. Diff)  HPI/Subjective: Feeling ok, in no distress;  reports some mild abd pain. No frank bleeding appreciated since admission.   Objective: Vitals:   05/18/16 1917 05/18/16 2208  BP:  (!) 103/52  Pulse: 77 78  Resp: 18 18  Temp:  98.3 F (36.8 C)    Intake/Output Summary (Last 24 hours) at 05/18/16 2210 Last data filed at 05/18/16 1113  Gross per 24 hour  Intake              240 ml  Output              100 ml  Net              140 ml   There were no vitals filed for this visit.  Exam:   General:  Afebrile, in no distress and complaining of mild abd pain. No CP and no SOB. Patient with tracheostomy and currently with ongoing NPO status and ongoing tube feedings.  Cardiovascular: S1 and S2, no rubs, no gallops  Respiratory: scattered rhonchi, no wheezing or crackles appreciated  Abdomen: soft, ND, positive mid adb discomfort with deep palpation, positive BS, no guarding.  Musculoskeletal: some arthritic deformities on her hands, no swelling, no cyanoisis.  Data Reviewed: Basic Metabolic Panel:  Recent Labs Lab 05/17/16 1843 05/18/16 0413  NA 134* 135  K 3.7 3.5  CL 93* 97*  CO2 31 31  GLUCOSE 92 123*  BUN 13 16  CREATININE 0.55 0.50  CALCIUM 9.0 8.7*   Liver Function Tests:  Recent Labs Lab 05/17/16 1843  AST 17  ALT 13*  ALKPHOS 88  BILITOT 0.7  PROT 7.1  ALBUMIN 3.7   CBC:  Recent Labs Lab 05/17/16 1843 05/18/16 0413  WBC 17.9* 14.6*  NEUTROABS 14.8*  --   HGB 10.7* 9.9*  HCT 31.9* 29.6*  MCV 98.8 98.7  PLT 277 257    Recent Results (from the past 240 hour(s))  Gastrointestinal Panel by PCR , Stool     Status: None   Collection Time: 05/17/16  4:53 PM  Result Value Ref Range Status   Campylobacter species NOT DETECTED NOT DETECTED Final   Plesimonas shigelloides NOT DETECTED NOT DETECTED Final   Salmonella species NOT DETECTED NOT DETECTED Final   Yersinia enterocolitica NOT DETECTED NOT DETECTED Final   Vibrio species NOT DETECTED NOT DETECTED Final   Vibrio cholerae NOT DETECTED  NOT DETECTED Final   Enteroaggregative E coli (EAEC) NOT DETECTED NOT DETECTED Final   Enteropathogenic E coli (EPEC) NOT DETECTED NOT DETECTED Final   Enterotoxigenic E coli (ETEC) NOT DETECTED NOT DETECTED Final   Shiga like toxin producing E coli (STEC) NOT DETECTED NOT DETECTED Final   Shigella/Enteroinvasive E coli (EIEC) NOT DETECTED NOT DETECTED Final   Cryptosporidium NOT DETECTED NOT DETECTED Final   Cyclospora cayetanensis NOT DETECTED NOT DETECTED Final   Entamoeba histolytica NOT DETECTED NOT DETECTED Final   Giardia lamblia NOT DETECTED NOT DETECTED Final   Adenovirus F40/41 NOT DETECTED NOT DETECTED Final   Astrovirus NOT DETECTED NOT DETECTED Final   Norovirus GI/GII NOT DETECTED NOT DETECTED Final   Rotavirus A NOT DETECTED NOT DETECTED Final   Sapovirus (I, II, IV, and V) NOT DETECTED NOT DETECTED Final  C difficile quick screen w PCR reflex     Status: None   Collection Time: 05/18/16  7:56 PM  Result Value Ref Range Status   C Diff antigen NEGATIVE NEGATIVE Final   C Diff toxin NEGATIVE NEGATIVE Final   C Diff interpretation No C. difficile detected.  Final     Studies: Dg Chest Portable 1 View  Result Date: 05/17/2016 CLINICAL DATA:  One episode of bloody/mucus stool since waking this morning. EXAM: PORTABLE CHEST 1 VIEW COMPARISON:  11/25/2015 FINDINGS: The cardiac silhouette is top-normal in size. There is aortic atherosclerosis without aneurysm. Gastric/feeding tube extends below the left hemidiaphragm. The lungs are slightly hyperinflated without acute pneumonic consolidation, CHF nor effusion. Old healing right-sided fifth and sixth rib fractures are noted posteriorly. Bilateral axillary clips are identified with old posttraumatic deformity of the right humeral head and ghost tracks along the proximal right humeral shaft. IMPRESSION: Slight hyperinflation of the lungs without acute pneumonic consolidation or CHF. Feeding tube/gastric tube extends below the left  hemidiaphragm. Healing right posterior fifth and sixth rib fractures. Old chronic deformity of the right humeral head with ghost tracks in the proximal humeral shaft. Electronically Signed   By: Ashley Royalty M.D.   On: 05/17/2016 17:08    Scheduled Meds: . balsalazide  2,250 mg Oral TID  . digoxin  0.125 mg Oral Daily  . diphenoxylate-atropine  1 tablet Oral BID  . feeding supplement (OSMOLITE 1.5 CAL)  237 mL Per Tube QID  . levETIRAcetam  1,500 mg Per Tube BID  . levothyroxine  50 mcg Oral QPM  . methylPREDNISolone (SOLU-MEDROL) injection  20 mg Intravenous Q12H  . multivitamin  15 mL Per Tube Daily  . PHENobarbital  97.2 mg Oral q morning - 10a  . pravastatin  40 mg Oral q1800  . vancomycin  500 mg Oral Q6H   Continuous Infusions: . sodium chloride 1 mL (05/17/16 2207)  . sodium chloride  Principal Problem:   Rectal bleeding Active Problems:   UC (ulcerative colitis) ([place])   Larynx cancer ([place])    Time spent: 25 minutes    Barton Dubois  [facility] Pager [phone]. If 7PM-7AM, please contact night-coverage at [email or web address], password [facility] 05/18/2016, 10:10 PM  LOS: 1 day

## 2016-05-18 NOTE — Progress Notes (Signed)
Initial Nutrition Assessment  DOCUMENTATION CODES:   Non-severe (moderate) malnutrition in context of chronic illness  INTERVENTION:   Tube feeding regimen: Provide Osmolite 1.5, 1 can (237 ml) QID. (1000, 1400, 1800, 2200) Provide 30 ml Prostat once daily Recommend free water flushes of 60 ml before and after each bolus. Recommend additional free water flushes of 150 ml BID (300 ml total) This tube feeding regimen provides 1520 kcal (100% of needs), 74g protein, and 1504 ml H2O.  Changed Multivitamin to liquid form via tube.  RD to continue to monitor  NUTRITION DIAGNOSIS:   Inadequate oral intake related to inability to eat as evidenced by NPO status.  GOAL:   Patient will meet greater than or equal to 90% of their needs  MONITOR:   Labs, Weight trends, TF tolerance, I & O's  REASON FOR ASSESSMENT:   Consult (Verbal per MD) Enteral/tube feeding initiation and management  ASSESSMENT:    75 y.o. female with medical history significant of UC, C.Diff in October, recent hospital admit to [facility] oct 31st for surgery for oral cancer.  Patient released about 1 week ago, repeat C.diff testing on 11/7 was negative though she was having diarrhea on discharge.  Past couple of days she has had increasing diarrhea which ultimately turned bloody.  She has been sent in to the ED.  Pt discussed with Pharmacy who needed clarification of what formula pt receives at home via PEG. During interdisciplinary rounds, RN reports husband states that patient was receiving Osmolite 1.5 via bolus feeds through PEG. RD spoke with MD regarding management privileges. MD verbally agreed for RD to manage tube feeds and to begin the patient's feedings today.    RD attempted to speak with pt but pt unable to speak at this time. Husband was not at bedside this morning. Will speak with husband at a later time. Per chart review, pt was receiving 1 can of  Osmolite 1.5 QID along with 1 bottle of ProMod daily  with 400-500 ml of free water.  Recommendations and orders are stated above.  Per chart review, no current weight for this admission. Will monitor for new recordings and assess weight status at that time. Per NFPE that RD could do, moderate fat depletion and severe muscle depletion present.  Labs reviewed. Medications: Multivitamin with minerals daily  Diet Order:     Skin:  Reviewed, no issues  Last BM:  11/15  Height:   Ht Readings from Last 1 Encounters:  02/22/16 5\' 4"  (1.626 m)    Weight:   Wt Readings from Last 1 Encounters:  02/22/16 115 lb (52.2 kg)    Ideal Body Weight:  54.5 kg (per height recorded from 8/21)  BMI:   19.7 per Ht & Wt from 8/21  Estimated Nutritional Needs:   Kcal:  1350-1550  Protein:  65-75g  Fluid:  1.4-1.6L/day  EDUCATION NEEDS:   Education needs no appropriate at this time  Clayton Bibles, MS, RD, LDN Pager: [phone] After Hours Pager: [phone]

## 2016-05-18 NOTE — Progress Notes (Signed)
Brief Pharmacy note:  Pharmacy consulted: Until patient passes a swallowing evaluation, she needs to remain nothing by mouth. Continue please guide [place] in terms of her med list and which can be crushed and given via the nasal feeding tube vs liquid formulations.  Plan: -keppra and oxycodone changed to liquid formulation -Digoxin, lomotil, levothyroxine, phenobarbital, pravastatin, and prednisone can all be crushed  Please re-consult if further assistance is needed.  Dolly Rias RPh 05/18/2016, 1:24 PM Pager [phone]

## 2016-05-18 NOTE — Consult Note (Signed)
[facility] Consult: 12:06 PM 05/18/2016  LOS: 1 day    Referring Provider: Dr Haze Boyden  Primary Care Physician:  Scarlette Calico, MD Primary Gastroenterologist:  Dr. Hilarie Fredrickson    Reason for Consultation:  Diarrhea.    HPI: [name] is a 75 y.o. female.  PMH  Hypothyroidism.  Seizure disorder.  Hx ulcerative colitis.  C diff + colitis 02/2011, 05/2011, 01/2014, 02/2014, 05/2014, 01/2015, 04/2016.  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  Previous colonoscopies dating back to 1997. 06/2013 Flexible sigmoidoscopy to assess colitis: mild sigmoid granularity, the UC was dramatically improved. 02/08/2011 incomplete colonoscopy converted to flex sig due to severe inflammation, colitis in rectum and sigmoid.  Treated with PO and PR Mesalamine and IV steroids >> prednisone.  12/2010 Flex Sig.  Colitis of rectum to splenic flexure.  Treated with Lialda and prednisone. And then started Remicade.   11/2009 Colonoscopy.  Adenoma surveillance.   Normal.     10/2006 Colonoscopy.  Adenomatous colon polyps.  09/2003 Colonoscopy: diverticulosis.  07/2000 Colonoscopy: polyps.  12/1995 Colonoscopy.  Resect residual adenoma at heptic flexure and cauterized an additional diminutive polyp.  07/1995 Colonoscopy: 2 large polyps.  The one at hepatic flexure removed piece meal, the other completely removed.  On 03/28/16, she had been having flare of diarrhea x 2 weeks and Dr Hilarie Fredrickson advised increase prednisone from 5 mg daily to "15 mg 1 week, 10 mg 1 week then back to 5 mg 1 week.  Continue mesalamine 4.8 grams per day.  If diarrhea not soon better ..test for C diff."  10/6, Dr Carlean Purl advised return to 15 mg Prednisone.  C diff tested + on 04/09/16 and started on Vanco.  Completed this 10/20  and diarrhea had resolved by then.   Prednisone tapered starting 04/15/16.  Continued on Lomotil PRN.  Chronic GI meds include Lialda 1.2 gm qid but has not been taking this recently.  Lomotil prn.  Prednisone 5mg  daily.    S/p 05/03/16 intraoral resection of floor of mouth carcinoma with partial glossectomy and rim mandibulectomy with deep geniohyoid and genioglossus muscle advancement flap for mandibular coverage; excision of right cervical node zone 1A at [facility].  They placed a fine gauged NG feeding tube through which she is being fed. Pathology: Invasive moderately differentiated squamous cell carcinoma extending to involve the anterior margin.  Severe dysplasia/squamous cell carcinoma in situ focally extends to involve the right lateral soft tissue margin.   Discharged home from oral cancer surgery 11/7.  She tested negative for C diff EIA that day but denies having had diarrhea during the admission. She was supposed to have a swallow evaluation at [facility] today, 05/18/16. She has been strictly npo and tolerating tube feeds.  There have been secretions from her mouth, some of which are bloody. Patient developed diarrhea about 36 hours ago. She describes about 5, watery, nonbloody (her report) stools in a 24-hour period. She became very weak.  Advised to go to ED.  Now admitted.   She denies n/v, abdominal  pain, heartburn, regurgitation..   Past Medical History:  Diagnosis Date  . 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 Anxiety   . Arthritis   . C. difficile colitis   . Cancer of larynx ([place])   . Cellulitis   . Cerebrovascular disease 2000  . Dementia   . Diverticulosis of colon (without mention of hemorrhage)   . Gait disorder   . H/O alcohol abuse   . History of UTI 10-2012  . Hyperlipidemia    takes Pravastatin daily  . Hypertension   . Hypothyroidism    takes Levothyroxine daily  . Joint pain   . Joint swelling   . Mild dysplasia of cervix   . Oropharyngeal  cancer ([place])   . Peripheral vascular disease ([place])   . Permanent atrial fibrillation ([place])   . Personal history of colonic polyps    adenomatous 1997 & tubular adenoma and hyplastic  2008  . Pneumonia 2/16  . Redundant colon   . Seizures ([place])    takes Phenobarbital and Keppra daily;last seizure was April 1,2014  . 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 Stroke ([place]) 2000   off balance on right side a little and peripheral vision loss on right side  . Subdural hematoma ([place]) 1990s   s/p fall in bathtub  . Ulcerative colitis    takes [place] daily  . Urinary incontinence   . UTI (lower urinary tract infection)     Past Surgical History:  Procedure Laterality Date  . BREAST LUMPECTOMY     left breast with radiation therapy  . CAROTID ENDARTERECTOMY     left  . CATARACT EXTRACTION, BILATERAL    . COLONOSCOPY    . FLEXIBLE SIGMOIDOSCOPY N/A 06/21/2013   Procedure: FLEXIBLE SIGMOIDOSCOPY;  Surgeon: Jerene Bears, MD;  Location: [facility];  Service: Gastroenterology;  Laterality: N/A;  . HAMMER TOE SURGERY Right 03/2013   pins and screws  . HARDWARE REMOVAL Right 11/14/2013   Procedure: RIGHT SHOUDER HARDWARE REMOVAL;  Surgeon: Nita Sells, MD;  Location: [facility] [place];  Service: Orthopedics;  Laterality: Right;  . INCISION AND DRAINAGE Right 11/14/2013   Procedure: INCISION AND DRAINAGE;  Surgeon: Nita Sells, MD;  Location: [place];  Service: Orthopedics;  Laterality: Right;  . LARYNX SURGERY  08/2010   [facility] x 2  . MASTECTOMY     Right, history with nodule dissection  . MULTIPLE TOOTH EXTRACTIONS     due to oral cancer  . Oral Surgery for squamous cell carcinoma of the mouth     x 3  . ORIF HIP FRACTURE  Sept '12   Right hip: screw and plate repair.  . ORIF HUMERUS FRACTURE Right 10/30/2012   Procedure: OPEN REDUCTION INTERNAL FIXATION (ORIF) HUMERAL SHAFT FRACTURE;  Surgeon: Hessie Dibble, MD;  Location: [place];  Service: Orthopedics;  Laterality: Right;   BIG C-ARM, SHOULDER POSITION  . SHOULDER SURGERY  10/2013  . SUBDURAL HEMATOMA EVACUATION VIA CRANIOTOMY    . TUBAL LIGATION      Prior to Admission medications   Medication Sig Start Date End Date Taking? Authorizing Provider  acetylcysteine (MUCOMYST) 10 % nebulizer solution Take 1-2 mLs by nebulization 2 (two) times daily as needed (for thick secretions in trachea).   Yes Historical Provider, MD  aspirin EC 81 MG tablet Take 81 mg by mouth daily with breakfast.   Yes Historical Provider, MD  Calcium Carbonate-Vitamin D (CALCIUM 500 + D PO) Take 1 tablet  by mouth daily.   Yes Historical Provider, MD  digoxin (LANOXIN) 0.125 MG tablet TAKE 1 TABLET (125 MCG TOTAL) BY MOUTH DAILY. Patient taking differently: TAKE 1 TABLET (125 MCG TOTAL) BY MOUTH every morning 04/01/16  Yes Janith Lima, MD  diphenoxylate-atropine (LOMOTIL) 2.5-0.025 MG tablet TAKE 1 TABLET BY MOUTH 4 TIMES A DAY AS NEEDED FOR DIARRHEA OR LOOSE STOOLS Patient taking differently: TAKE 1 TABLET BY MOUTH twice per day 03/16/16  Yes Jerene Bears, MD  furosemide (LASIX) 20 MG tablet TAKE 1 TABLET EVERY DAY Patient taking differently: TAKE 1 TABLEt by mouth every morning 09/04/15  Yes Janith Lima, MD  levETIRAcetam (KEPPRA) 750 MG tablet Take 2 tablets (1,500 mg total) by mouth 2 (two) times daily. 10/19/15  Yes Kathrynn Ducking, MD  levothyroxine (SYNTHROID, LEVOTHROID) 50 MCG tablet TAKE 1 TABLET BY MOUTH ONCE EVERY EVENING 12/29/15  Yes Janith Lima, MD  Mesalamine 800 MG TBEC Take 6 tablets (4,800 mg total) by mouth daily. 11/17/15  Yes Jerene Bears, MD  Multiple Vitamin (MULTIVITAMIN WITH MINERALS) TABS Take 1 tablet by mouth daily with breakfast.    Yes Historical Provider, MD  Nutritional Supplements (OSMOLITE) LIQD Take by mouth 4 (four) times daily.   Yes Historical Provider, MD  Nutritional Supplements (PROMOD) LIQD Take 1 Bottle by mouth daily.   Yes Historical Provider, MD  oxyCODONE (OXY IR/ROXICODONE) 5 MG immediate  release tablet Take 5 mg by mouth 3 (three) times daily as needed for pain. 05/12/16  Yes Historical Provider, MD  PHENObarbital (LUMINAL) 100 MG tablet TAKE 1 TABLET BY MOUTH EVERY DAY AT BEDTIME Patient taking differently: TAKE 1 TABLET BY MOUTH EVERY morning 12/28/15  Yes Janith Lima, MD  pravastatin (PRAVACHOL) 40 MG tablet TAKE 1 TABLET (40 MG TOTAL) BY MOUTH EVERY EVENING. 12/29/15  Yes Janith Lima, MD  predniSONE (DELTASONE) 5 MG tablet TAKE 1 TABLET (5 MG TOTAL) BY MOUTH DAILY WITH BREAKFAST. 03/28/16  Yes Jerene Bears, MD    Scheduled Meds: . digoxin  0.125 mg Oral Daily  . diphenoxylate-atropine  1 tablet Oral BID  . feeding supplement (OSMOLITE 1.5 CAL)  237 mL Per Tube QID  . levETIRAcetam  1,500 mg Oral BID  . levothyroxine  50 mcg Oral QPM  . multivitamin  15 mL Per Tube Daily  . PHENobarbital  97.2 mg Oral q morning - 10a  . pravastatin  40 mg Oral q1800  . predniSONE  20 mg Oral Q breakfast   Infusions: . sodium chloride 1 mL (05/17/16 2207)  . sodium chloride     PRN Meds: acetylcysteine, oxyCODONE   Allergies as of 05/17/2016 - Review Complete 05/17/2016  Allergen Reaction Noted  . Xarelto [rivaroxaban]  12/03/2014  . Diltiazem Other (See Comments) 10/29/2014    Family History  Problem Relation Age of Onset  . Coronary artery disease Mother   . Heart disease Mother   . Diabetes Sister   . Breast cancer Maternal Aunt   . Breast cancer Sister   . Colon cancer      nephew (sister's son)    Social History   Social History  . Marital status: Married    Spouse name: Marden Noble  . Number of children: 2  . Years of education: 12   Occupational History  . retired Retired    Art therapist   Social History Main Topics  . Smoking status: Former Smoker    Years: 40.00    Quit  date: 10/26/2004  . Smokeless tobacco: Never Used     Comment: quit in 10/27/05  . Alcohol use 0.0 oz/week     Comment: normally 1 glass wine after dinner - occasional  . Drug use:  No  . Sexual activity: No   Other Topics Concern  . Not on file   Social History Narrative   HSG. Married. married October 28, 2067. 1 son- died as a neonate, 1 son- 28-Oct-2058. Retired- worked as a Art therapist.   End of life: has a living will- does not want futile/ heroic care; no cpr.     Marriage- reports marriage is in good health (4/10)      Patient is left handed.   Patient drinks 3-4 cups of tea daily.                REVIEW OF SYSTEMS: Constitutional:  Weakness yesterday, this is better today but she has been in bed all day so far today. ENT:  No nose bleeds Pulm:  No shortness of breath or dyspnea. Some cough, this is stable. CV:  No palpitations, no LE edema.  GU:  No hematuria, no frequency GI:  Per HPI Heme:  No unusual bleeding or bruising. Did receive Lovenox shots during recent hospitalization.   Transfusions:  Denies recent transfusions. Neuro:  No headaches, no peripheral tingling or numbness Derm:  No itching, no rash or sores.  Endocrine:  No sweats or chills.  No polyuria or dysuria Immunization:  Current 4 2015-10-28 flu shot. Multiple vaccinations reviewed and Pneumovax, tetanus/diphtheria are up-to-date. Travel:  None beyond local counties in last few months.    PHYSICAL EXAM: Vital signs in last 24 hours: Vitals:   05/17/16 2300 05/18/16 0856  BP: (!) 109/58   Pulse: 77 (!) 103  Resp: 18 18  Temp: 98.3 F (36.8 C)    Wt Readings from Last 3 Encounters:  02/22/16 52.2 kg (115 lb)  12/07/15 52.2 kg (115 lb)  11/18/15 54.4 kg (120 lb)    General: Frail appearing, petite white female. She looks chronically ill.  Communicates well using a dry erase board. Head:  No facial edema. Jaw is asymmetric owing to surgeries. There is some tannish oral discharge settled in the chin area. No gross blood evident.  Eyes:  Conjunctiva are pale. No scleral icterus. Ears:  Not HOH.  Nose:  There is a fine gauged feeding tube in the left nare. Mouth:  Tongue is shortened. It  appears midline. Oral mucosa is moist and clear. No visible blood. Neck:  No mass. No JVD. There is a valve overlying the tracheostomy site.  Incision site just beneath the right mandible is benign appearing. Good eschar overlying the fine scar. Lungs:  Somewhat mucoid cough. No dyspnea. Lungs are clear. Heart: Irregularly irregular heart rate. This is not accelerated. Abdomen:  Soft, thin, not tender. Bowel sounds active. Not distended. No HSM or mass.  Several small purpura at site of previous anticoagulation injections..   Rectal: Deferred rectal exam.   Musc/Skeltl: There is some arthritic deformity and deviation in the fingers on both hands. Extremities:  No lower extremity edema. Feet are warm.  Neurologic:  Patient is fully alert and communicates well file her dry erase board. She can move all 4 limbs, strength was not tested. No tremor.  To my exam she does not seem demented at all. Skin:  No open sores or rashes. Tattoos:  None observed.   Psych:  Pleasant, calm, cooperative.  Intake/Output from previous  day: 11/14 0701 - 11/15 0700 In: -  Out: 25 [Urine:25] Intake/Output this shift: Total I/O In: 240 [NG/GT:240] Out: 100 [Urine:100]  LAB RESULTS:  Recent Labs  05/17/16 1843 05/18/16 0413  WBC 17.9* 14.6*  HGB 10.7* 9.9*  HCT 31.9* 29.6*  PLT 277 257   BMET Lab Results  Component Value Date   NA 135 05/18/2016   NA 134 (L) 05/17/2016   NA 129 (L) 02/22/2016   K 3.5 05/18/2016   K 3.7 05/17/2016   K 5.7 (H) 02/22/2016   CL 97 (L) 05/18/2016   CL 93 (L) 05/17/2016   CL 89 (L) 02/22/2016   CO2 31 05/18/2016   CO2 31 05/17/2016   CO2 34 (H) 02/22/2016   GLUCOSE 123 (H) 05/18/2016   GLUCOSE 92 05/17/2016   GLUCOSE 93 02/22/2016   BUN 16 05/18/2016   BUN 13 05/17/2016   BUN 9 02/22/2016   CREATININE 0.50 05/18/2016   CREATININE 0.55 05/17/2016   CREATININE 0.65 02/22/2016   CALCIUM 8.7 (L) 05/18/2016   CALCIUM 9.0 05/17/2016   CALCIUM 9.9 02/22/2016    LFT  Recent Labs  05/17/16 1843  PROT 7.1  ALBUMIN 3.7  AST 17  ALT 13*  ALKPHOS 88  BILITOT 0.7   PT/INR Lab Results  Component Value Date   INR 1.17 05/17/2016   INR 1.24 01/07/2015   INR 1.26 03/18/2012   Hepatitis Panel No results for input(s): HEPBSAG, HCVAB, HEPAIGM, HEPBIGM in the last 72 hours. C-Diff No components found for: CDIFF Lipase     Component Value Date/Time   LIPASE 69 (H) 10/27/2011 1635    Drugs of Abuse     Component Value Date/Time   LABOPIA NONE DETECTED 09/16/2010 2250   COCAINSCRNUR NONE DETECTED 09/16/2010 2250   LABBENZ NONE DETECTED 09/16/2010 2250   AMPHETMU NONE DETECTED 09/16/2010 2250   THCU NONE DETECTED 09/16/2010 2250   LABBARB (A) 09/16/2010 2250    POSITIVE        DRUG SCREEN FOR MEDICAL PURPOSES ONLY.  IF CONFIRMATION IS NEEDED FOR ANY PURPOSE, NOTIFY LAB WITHIN 5 DAYS.        LOWEST DETECTABLE LIMITS FOR URINE DRUG SCREEN Drug Class       Cutoff (ng/mL) Amphetamine      1000 Barbiturate      200 Benzodiazepine   [id number] Tricyclics       [id number] Opiates          300 Cocaine          300 THC              50     RADIOLOGY STUDIES: Dg Chest Portable 1 View  Result Date: 05/17/2016 CLINICAL DATA:  One episode of bloody/mucus stool since waking this morning. EXAM: PORTABLE CHEST 1 VIEW COMPARISON:  11/25/2015 FINDINGS: The cardiac silhouette is top-normal in size. There is aortic atherosclerosis without aneurysm. Gastric/feeding tube extends below the left hemidiaphragm. The lungs are slightly hyperinflated without acute pneumonic consolidation, CHF nor effusion. Old healing right-sided fifth and sixth rib fractures are noted posteriorly. Bilateral axillary clips are identified with old posttraumatic deformity of the right humeral head and ghost tracks along the proximal right humeral shaft. IMPRESSION: Slight hyperinflation of the lungs without acute pneumonic consolidation or CHF. Feeding tube/gastric tube extends below the  left hemidiaphragm. Healing right posterior fifth and sixth rib fractures. Old chronic deformity of the right humeral head with ghost tracks in the proximal humeral shaft. Electronically Signed  By: Ashley Royalty M.D.   On: 05/17/2016 17:08     IMPRESSION:   *  Recurrent diarrhea in pt with hx left sided UC and multiple recurrences of C diff, last of the was early 04/2016 and responded to oral vanco 10/7 - 04/22/16.  C diff negative on 11/7.    WBCs have improved already, even without antibiotic initiation.  *  Squamous cell cancer of mouth.  S/p extensive ENT surgery 05/03/16 at [facility].  Previous laryngectomy for sq cell cancer in 07/2011.   *  Postoperative dysphagia. Was supposed to have a swallow evaluation today, at [place] if this can be performed while she is here at [facility]?  *  Hyponatremia.  This dates back to at least 2012.   *  Normocytic anemia. Not a new issue but stable c/w Hgb 9.6 on 11/7.     PLAN:     *  Begin empiric oral Vanc.  Ordered C diff.   Switch the prednisone from 20 mg daily, back to 5 mg daily which is her home dose.  *  Consider pursuing speech-language pathology consultation for swallow evaluation. In the meantime she should remain nothing by mouth as she has been since her surgery.  *  Will need to begin tube feedings.  I requested a dietary consult regarding tube feedings. Also requested a pharmacy consult regarding which medicines can be crushed and administered versus which medicines can be given in liquid form.  *  CBC in the morning.   Azucena Freed  05/18/2016, 12:06 PM Pager: [phone]

## 2016-05-19 ENCOUNTER — Encounter (HOSPITAL_COMMUNITY): Payer: Self-pay

## 2016-05-19 DIAGNOSIS — K51911 Ulcerative colitis, unspecified with rectal bleeding: Secondary | ICD-10-CM | POA: Diagnosis not present

## 2016-05-19 DIAGNOSIS — C329 Malignant neoplasm of larynx, unspecified: Secondary | ICD-10-CM

## 2016-05-19 DIAGNOSIS — E785 Hyperlipidemia, unspecified: Secondary | ICD-10-CM | POA: Diagnosis not present

## 2016-05-19 DIAGNOSIS — Z8619 Personal history of other infectious and parasitic diseases: Secondary | ICD-10-CM | POA: Diagnosis not present

## 2016-05-19 DIAGNOSIS — E039 Hypothyroidism, unspecified: Secondary | ICD-10-CM | POA: Diagnosis not present

## 2016-05-19 DIAGNOSIS — K625 Hemorrhage of anus and rectum: Secondary | ICD-10-CM | POA: Diagnosis not present

## 2016-05-19 DIAGNOSIS — D72829 Elevated white blood cell count, unspecified: Secondary | ICD-10-CM | POA: Diagnosis not present

## 2016-05-19 LAB — CBC
HEMATOCRIT: 30.8 % — AB (ref 36.0–46.0)
HEMOGLOBIN: 10.3 g/dL — AB (ref 12.0–15.0)
MCH: 32.9 pg (ref 26.0–34.0)
MCHC: 33.4 g/dL (ref 30.0–36.0)
MCV: 98.4 fL (ref 78.0–100.0)
Platelets: 275 10*3/uL (ref 150–400)
RBC: 3.13 MIL/uL — ABNORMAL LOW (ref 3.87–5.11)
RDW: 16.6 % — AB (ref 11.5–15.5)
WBC: 12.2 10*3/uL — AB (ref 4.0–10.5)

## 2016-05-19 MED ORDER — FREE WATER
150.0000 mL | Freq: Two times a day (BID) | Status: DC
Start: 2016-05-19 — End: 2016-05-21
  Administered 2016-05-19 – 2016-05-21 (×5): 150 mL

## 2016-05-19 MED ORDER — OSMOLITE 1.5 CAL PO LIQD
237.0000 mL | Freq: Four times a day (QID) | ORAL | Status: DC
  Administered 2016-05-19 – 2016-05-21 (×8): 237 mL
  Filled 2016-05-19 (×11): qty 237

## 2016-05-19 MED ORDER — FREE WATER: 120.0000 mL | Freq: Two times a day (BID) | Status: DC

## 2016-05-19 NOTE — Progress Notes (Signed)
[facility] PROGRESS NOTE  [name] [id number] DOB: March 12, 1941 DOA: 05/17/2016 PCP: Scarlette Calico, MD  Interim summary and HPI 75 y.o. female with medical history significant of UC, C.Diff in October, recent hospital admit to [facility] oct 31st for surgery for oral cancer.  Patient released about 1 week ago, repeat C.diff testing on 11/7 was negative though she was having diarrhea on discharge.  Past couple of days she has had increasing diarrhea which ultimately turned bloody. She has been sent in to the ED. Regarding her UC: patient is now being fed through a feeding tube, and as such due to her melsalamine being an extended release tab that cannot be crushed she has been off of this since Oct 30th.  She has only been taking prednisone 5mg  daily for UC.  Before coming to ED today they called Dr. Vena Rua office who recommended increasing prednisone to 20mg  daily.  Assessment/Plan: 1-Rectal bleeding: -appears to be secondary to IBD flare (UC) -will continue steroids as recommended by GI (now on solumedrol BID) -now on balsalazide as recommended by GI -will follow rec's -will follow Hgb trend   2-s/p oral cancer surgery -tube feedings dependent currently -will follow SPL evaluation and recommendations  -continue tube feedings as per nutritional service   3-HLD: -continue statins  4-hx of seizure disorder: -will continue keppra and phenobarbital  5-hypothyroidism -will continue synthroid   6-severe protein calorie malnutrition -continue tube feedings for now -free water and feeding supplements as per nutritional service   Code Status:Full Family Communication: no family at bedside  Disposition Plan: remains inpatient; will follow GI rec's; started back on tube feedings by nutritional service. PO vanc discontinued as neg C. Diff results retrieved. Patient will be continued on IV solumedrol as recommended GI for UC flare.  Consultants:  GI  Procedures:  See  below for x-ray reports   Antibiotics:  Vancomycin PO (for potential C. Diff)>>> stopped on 11/15  HPI/Subjective: Feeling ok, in no distress; reports improvement in abd pain. No fever. No frank bleeding appreciated since admission.   Objective: Vitals:   05/18/16 2208 05/19/16 0438  BP: (!) 103/52 139/65  Pulse: 78 79  Resp: 18 18  Temp: 98.3 F (36.8 C) 97.9 F (36.6 C)   No intake or output data in the 24 hours ending 05/19/16 1737 There were no vitals filed for this visit.  Exam:   General:  Afebrile, in no distress and reporting improvement in her abd pain. No CP and no SOB. Patient with tracheostomy and currently with ongoing NPO status and ongoing tube feedings. Patient is clinically stable and in fact looking better (less dehydrated). No further bloody stools reported by staff.  Cardiovascular: S1 and S2, no rubs, no gallops  Respiratory: scattered rhonchi, no wheezing or crackles appreciated  Abdomen: soft, ND, mild mid adb discomfort with deep palpation, positive BS, no guarding.  Musculoskeletal: some arthritic deformities on her hands, no swelling, no cyanoisis.  Data Reviewed: Basic Metabolic Panel:  Recent Labs Lab 05/17/16 1843 05/18/16 0413  NA 134* 135  K 3.7 3.5  CL 93* 97*  CO2 31 31  GLUCOSE 92 123*  BUN 13 16  CREATININE 0.55 0.50  CALCIUM 9.0 8.7*   Liver Function Tests:  Recent Labs Lab 05/17/16 1843  AST 17  ALT 13*  ALKPHOS 88  BILITOT 0.7  PROT 7.1  ALBUMIN 3.7   CBC:  Recent Labs Lab 05/17/16 1843 05/18/16 0413 05/19/16 0422  WBC 17.9* 14.6* 12.2*  NEUTROABS 14.8*  --   --  HGB 10.7* 9.9* 10.3*  HCT 31.9* 29.6* 30.8*  MCV 98.8 98.7 98.4  PLT 277 257 275    Recent Results (from the past 240 hour(s))  Gastrointestinal Panel by PCR , Stool     Status: None   Collection Time: 05/17/16  4:53 PM  Result Value Ref Range Status   Campylobacter species NOT DETECTED NOT DETECTED Final   Plesimonas shigelloides NOT  DETECTED NOT DETECTED Final   Salmonella species NOT DETECTED NOT DETECTED Final   Yersinia enterocolitica NOT DETECTED NOT DETECTED Final   Vibrio species NOT DETECTED NOT DETECTED Final   Vibrio cholerae NOT DETECTED NOT DETECTED Final   Enteroaggregative E coli (EAEC) NOT DETECTED NOT DETECTED Final   Enteropathogenic E coli (EPEC) NOT DETECTED NOT DETECTED Final   Enterotoxigenic E coli (ETEC) NOT DETECTED NOT DETECTED Final   Shiga like toxin producing E coli (STEC) NOT DETECTED NOT DETECTED Final   Shigella/Enteroinvasive E coli (EIEC) NOT DETECTED NOT DETECTED Final   Cryptosporidium NOT DETECTED NOT DETECTED Final   Cyclospora cayetanensis NOT DETECTED NOT DETECTED Final   Entamoeba histolytica NOT DETECTED NOT DETECTED Final   Giardia lamblia NOT DETECTED NOT DETECTED Final   Adenovirus F40/41 NOT DETECTED NOT DETECTED Final   Astrovirus NOT DETECTED NOT DETECTED Final   Norovirus GI/GII NOT DETECTED NOT DETECTED Final   Rotavirus A NOT DETECTED NOT DETECTED Final   Sapovirus (I, II, IV, and V) NOT DETECTED NOT DETECTED Final  C difficile quick screen w PCR reflex     Status: None   Collection Time: 05/18/16  7:56 PM  Result Value Ref Range Status   C Diff antigen NEGATIVE NEGATIVE Final   C Diff toxin NEGATIVE NEGATIVE Final   C Diff interpretation No C. difficile detected.  Final     Studies: No results found.  Scheduled Meds: . balsalazide  2,250 mg Oral TID  . digoxin  0.125 mg Oral Daily  . diphenoxylate-atropine  1 tablet Oral BID  . feeding supplement (OSMOLITE 1.5 CAL)  237 mL Per Tube QID  . free water  150 mL Per Tube BID  . levETIRAcetam  1,500 mg Per Tube BID  . levothyroxine  50 mcg Oral QPM  . methylPREDNISolone (SOLU-MEDROL) injection  20 mg Intravenous Q12H  . multivitamin  15 mL Per Tube Daily  . PHENobarbital  97.2 mg Oral q morning - 10a  . pravastatin  40 mg Oral q1800   Continuous Infusions: . sodium chloride 1 mL (05/17/16 2207)  . sodium  chloride      Principal Problem:   Rectal bleeding Active Problems:   UC (ulcerative colitis) ([place])   Larynx cancer ([place])    Time spent: 25 minutes    Barton Dubois  [facility] Pager [phone]. If 7PM-7AM, please contact night-coverage at [email or web address], password [facility] 05/19/2016, 5:37 PM  LOS: 2 days

## 2016-05-19 NOTE — Progress Notes (Signed)
Nutrition Follow-up  DOCUMENTATION CODES:   Non-severe (moderate) malnutrition in context of chronic illness  INTERVENTION:   Recommend obtaining current weight for this admission (Last recorded on 8/21)  Tube feeding regimen: Provide Osmolite 1.5, 1 can (237 ml) QID. (1000, 1400, 1800, 2200) Provide 30 ml Prostat once daily Recommend free water flushes of 60 ml before and after each bolus. Recommend additional free water flushes of 150 ml BID (300 ml total) This tube feeding regimen provides 1520 kcal (100% of needs), 74g protein, and 1504 ml H2O.  Continue Liquid multivitamin via tube.  RD to continue to monitor  NUTRITION DIAGNOSIS:   Inadequate oral intake related to inability to eat as evidenced by NPO status.  Ongoing.  GOAL:   Patient will meet greater than or equal to 90% of their needs  Meeting.  MONITOR:   Labs, Weight trends, TF tolerance, I & O's  ASSESSMENT:    75 y.o. female with medical history significant of UC, C.Diff in October, recent hospital admit to [facility] oct 31st for surgery for oral cancer.  Patient released about 1 week ago, repeat C.diff testing on 11/7 was negative though she was having diarrhea on discharge.  Past couple of days she has had increasing diarrhea which ultimately turned bloody.  She has been sent in to the ED.  Pt in room with no family at bedside. Pt was able to communicate via white board and marker. Pt reports tolerating her tube feed with no issue. She has been frustrated with the communication with staff. Unable to provide any additional information at this time.   Will continue to monitor for additional nutrition needs.  Labs reviewed. Medications: Liquid MVI  Diet Order:  Diet NPO time specified  Skin:  Reviewed, no issues  Last BM:  11/16  Height:   Ht Readings from Last 1 Encounters:  02/22/16 5\' 4"  (1.626 m)    Weight:   Wt Readings from Last 1 Encounters:  02/22/16 115 lb (52.2 kg)    Ideal Body  Weight:  54.5 kg (per height recorded from 8/21)  BMI:  19.7 per Ht & Wt from 8/21  Estimated Nutritional Needs:   Kcal:  1350-1550  Protein:  65-75g  Fluid:  1.4-1.6L/day  EDUCATION NEEDS:   Education needs no appropriate at this time  Clayton Bibles, MS, RD, LDN Pager: [phone] After Hours Pager: [phone]

## 2016-05-19 NOTE — Progress Notes (Signed)
Patient ID: [name], female   DOB: 1940/08/28, 75 y.o.   MRN: [id number]    Progress Note   Subjective    feels OK , no further bleeding per pt- she is hungry and tube feeds have not been started  Cdiff  negative   Objective   Vital signs in last 24 hours: Temp:  [97.9 F (36.6 C)-98.3 F (36.8 C)] 97.9 F (36.6 C) (11/16 0438) Pulse Rate:  [77-80] 79 (11/16 0438) Resp:  [18] 18 (11/16 0438) BP: (102-139)/(52-65) 139/65 (11/16 0438) SpO2:  [93 %-99 %] 95 % (11/16 0750) Last BM Date: 05/19/16 General: elderly   white female in NAD s/p radical /head neck surgery - enteral tube in nose Heart:  Regular rate and rhythm; no murmurs Lungs: Respirations even and unlabored, lungs CTA bilaterally Abdomen:  Soft, nontender and nondistended. Normal bowel sounds. Extremities:  Without edema. Neurologic:  Alert and oriented,  grossly normal neurologically, unable to talk -uses writing board to communicate  Psych:  Cooperative. Normal mood and affect.  Intake/Output from previous day: 11/15 0701 - 11/16 0700 In: 240 [NG/GT:240] Out: 100 [Urine:100] Intake/Output this shift: No intake/output data recorded.  Lab Results:  Recent Labs  05/17/16 1843 05/18/16 0413 05/19/16 0422  WBC 17.9* 14.6* 12.2*  HGB 10.7* 9.9* 10.3*  HCT 31.9* 29.6* 30.8*  PLT 277 257 275   BMET  Recent Labs  05/17/16 1843 05/18/16 0413  NA 134* 135  K 3.7 3.5  CL 93* 97*  CO2 31 31  GLUCOSE 92 123*  BUN 13 16  CREATININE 0.55 0.50  CALCIUM 9.0 8.7*   LFT  Recent Labs  05/17/16 1843  PROT 7.1  ALBUMIN 3.7  AST 17  ALT 13*  ALKPHOS 88  BILITOT 0.7   PT/INR  Recent Labs  05/17/16 1843  LABPROT 15.0  INR 1.17    Studies/Results: Dg Chest Portable 1 View  Result Date: 05/17/2016 CLINICAL DATA:  One episode of bloody/mucus stool since waking this morning. EXAM: PORTABLE CHEST 1 VIEW COMPARISON:  11/25/2015 FINDINGS: The cardiac silhouette is top-normal in size. There is  aortic atherosclerosis without aneurysm. Gastric/feeding tube extends below the left hemidiaphragm. The lungs are slightly hyperinflated without acute pneumonic consolidation, CHF nor effusion. Old healing right-sided fifth and sixth rib fractures are noted posteriorly. Bilateral axillary clips are identified with old posttraumatic deformity of the right humeral head and ghost tracks along the proximal right humeral shaft. IMPRESSION: Slight hyperinflation of the lungs without acute pneumonic consolidation or CHF. Feeding tube/gastric tube extends below the left hemidiaphragm. Healing right posterior fifth and sixth rib fractures. Old chronic deformity of the right humeral head with ghost tracks in the proximal humeral shaft. Electronically Signed   By: Ashley Royalty M.D.   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   #3 normocytic anemia  Principal Problem:   Rectal bleeding Active Problems:   UC (ulcerative colitis) ([place])   Larynx cancer ([place])     LOS: 2 days   Amy Esterwood  05/19/2016, 11:54 AM

## 2016-05-19 NOTE — Progress Notes (Signed)
Patient has home HME in place. It is saturated to the point of being nonfunctional. However, the patient refuses to have it removed from her stoma. She is using an aerosol trach collar for humidification and oxygen delivery. RN aware. Attempts to educate are met with the patient hand in the air and her mouthing to "shut up" and asking me to leave. Will continue to follow.

## 2016-05-19 NOTE — Care Management Note (Signed)
Case Management Note  Patient Details  Name: [name] MRN: [id number] Date of Birth: 02/06/1941  Subjective/Objective:         75 yo admitted with Rectal bleeding           Action/Plan: From home with husband. Pt on home 02-2L and has [facility] home health services for PT/RN/Aide. Will need resumption orders for [facility] services at DC. [facility] rep made aware of admission. CM will continue to follow.  Expected Discharge Date:   (unknown)               Expected Discharge Plan:  [place]  In-House Referral:     Discharge planning Services  CM Consult  Post Acute Care Choice:    Choice offered to:     DME Arranged:    DME Agency:     HH Arranged:  RN, PT, Nurse's Aide [place] Agency:  [place] ([facility])  Status of Service:  In process, will continue to follow  If discussed at Long Length of Stay Meetings, dates discussed:    Additional CommentsLynnell Catalan, RN 05/19/2016, 2:20 PM  [phone]

## 2016-05-19 NOTE — Consult Note (Signed)
   [facility] CM Inpatient Consult   05/19/2016  DELANI RAMALHO 06/12/1941 [id number]   Patient screened for [place] [employer] program as a benefit of her [employer] insurance. Went to bedside and patient sighed and motioned with her hand up to Probation officer. Briefly described [place] [employer] program and Mrs. Sublett motioned no and waved her hand to Probation officer to leave brochure. She did not express interest in [place] [employer] and appeared displeased with writer's visit. Will make inpatient RNCM aware that Mrs. Eblin declined [facility] services.    Marthenia Rolling, MSN-Ed, RN,BSN [facility] Liaison [phone]

## 2016-05-20 ENCOUNTER — Telehealth: Payer: Self-pay | Admitting: Internal Medicine

## 2016-05-20 ENCOUNTER — Encounter (HOSPITAL_COMMUNITY): Payer: Self-pay | Admitting: *Deleted

## 2016-05-20 DIAGNOSIS — Z8619 Personal history of other infectious and parasitic diseases: Secondary | ICD-10-CM | POA: Diagnosis not present

## 2016-05-20 DIAGNOSIS — C329 Malignant neoplasm of larynx, unspecified: Secondary | ICD-10-CM | POA: Diagnosis not present

## 2016-05-20 DIAGNOSIS — K51911 Ulcerative colitis, unspecified with rectal bleeding: Secondary | ICD-10-CM | POA: Diagnosis not present

## 2016-05-20 DIAGNOSIS — K625 Hemorrhage of anus and rectum: Secondary | ICD-10-CM | POA: Diagnosis not present

## 2016-05-20 MED ORDER — CHLORHEXIDINE GLUCONATE 0.12 % MT SOLN
15.0000 mL | Freq: Two times a day (BID) | OROMUCOSAL | Status: DC
  Administered 2016-05-20 – 2016-05-21 (×3): 15 mL via OROMUCOSAL
  Filled 2016-05-20 (×3): qty 15

## 2016-05-20 MED ORDER — ORAL CARE MOUTH RINSE
15.0000 mL | Freq: Two times a day (BID) | OROMUCOSAL | Status: DC
  Administered 2016-05-20 – 2016-05-21 (×3): 15 mL via OROMUCOSAL

## 2016-05-20 NOTE — Progress Notes (Signed)
[facility] PROGRESS NOTE  [name] [id number] DOB: [birth year] DOA: 05/17/2016 PCP: [name] Calico, MD  Interim summary and HPI 75 y.o. female with medical history significant of UC, C.Diff in October, recent hospital admit to [facility] oct 31st for surgery for oral cancer.  Patient released about 1 week ago, repeat C.diff testing on 11/7 was negative though she was having diarrhea on discharge.  Past couple of days she has had increasing diarrhea which ultimately turned bloody. She has been sent in to the ED. Regarding her UC: patient is now being fed through a feeding tube, and as such due to her melsalamine being an extended release tab that cannot be crushed she has been off of this since Oct 30th.  She has only been taking prednisone 5mg  daily for UC.  Before coming to [facility] called Dr. Vena [name] office who recommended increasing prednisone to 20mg  daily.  Assessment/Plan:  UC flare with rectal bleed - GI consult appreciated, continue with IV steroids per GI recommendation, now on balsalazide  Which can be opened and put through feeding tube . - C diff negative - She reports improvement of her diarrhea - hemoglobin remained stable  s/p oral cancer surgery/external feeds -tube feedings dependent currently, was resumed yesterday - SPL evaluation pending  -continue tube feedings as per nutritional service   History of laryngeal cancer - Patient with permanent trach, followed at [place] -continue statins  hx of seizure disorder: -will continue keppra and phenobarbital  hypothyroidism -will continue synthroid   severe protein calorie malnutrition -continue tube feedings for now -free water and feeding supplements as per nutritional service   Code Status:Full Family Communication: no family at bedside  Disposition Plan: We'll request PT consult  Consultants:  GI  Procedures:  See below for x-ray reports   Antibiotics:  Vancomycin PO (for potential  C. Diff)>>> stopped on 11/15  HPI/Subjective: Tube Feeling ok, in no distress; reports improvement in abd pain. No fever. Reports diarrhea is improving.   Objective: Vitals:   05/20/16 0538 05/20/16 1030  BP: (!) 141/81   Pulse:  75  Resp: 20   Temp: 97.6 F (36.4 C)     Intake/Output Summary (Last 24 hours) at 05/20/16 1153 Last data filed at 05/20/16 1017  Gross per 24 hour  Intake              387 ml  Output                0 ml  Net              387 ml   Filed Weights   05/19/16 1900  Weight: 52.3 kg (115 lb 3.2 oz)    Exam:   General:   Patient with Permanent tracheostomy , awake alert and appropriate   Cardiovascular: S1 and S2, no rubs, no gallops  Respiratory: scattered rhonchi, no wheezing or crackles appreciated  Abdomen: soft, ND, mild mid adb discomfort with deep palpation, positive BS, no guarding.  Musculoskeletal: some arthritic deformities on her hands, no swelling, no cyanoisis.  Data Reviewed: Basic Metabolic Panel:  Recent Labs Lab 05/17/16 1843 05/18/16 0413  NA 134* 135  K 3.7 3.5  CL 93* 97*  CO2 31 31  GLUCOSE 92 123*  BUN 13 16  CREATININE 0.55 0.50  CALCIUM 9.0 8.7*   Liver Function Tests:  Recent Labs Lab 05/17/16 1843  AST 17  ALT 13*  ALKPHOS 88  BILITOT 0.7  PROT 7.1  ALBUMIN  3.7   CBC:  Recent Labs Lab 05/17/16 1843 05/18/16 0413 05/19/16 0422  WBC 17.9* 14.6* 12.2*  NEUTROABS 14.8*  --   --   HGB 10.7* 9.9* 10.3*  HCT 31.9* 29.6* 30.8*  MCV 98.8 98.7 98.4  PLT 277 257 275    Recent Results (from the past 240 hour(s))  Gastrointestinal Panel by PCR , Stool     Status: None   Collection Time: 05/17/16  4:53 PM  Result Value Ref Range Status   Campylobacter species NOT DETECTED NOT DETECTED Final   Plesimonas shigelloides NOT DETECTED NOT DETECTED Final   Salmonella species NOT DETECTED NOT DETECTED Final   Yersinia enterocolitica NOT DETECTED NOT DETECTED Final   Vibrio species NOT DETECTED NOT  DETECTED Final   Vibrio cholerae NOT DETECTED NOT DETECTED Final   Enteroaggregative E coli (EAEC) NOT DETECTED NOT DETECTED Final   Enteropathogenic E coli (EPEC) NOT DETECTED NOT DETECTED Final   Enterotoxigenic E coli (ETEC) NOT DETECTED NOT DETECTED Final   Shiga like toxin producing E coli (STEC) NOT DETECTED NOT DETECTED Final   Shigella/Enteroinvasive E coli (EIEC) NOT DETECTED NOT DETECTED Final   Cryptosporidium NOT DETECTED NOT DETECTED Final   Cyclospora cayetanensis NOT DETECTED NOT DETECTED Final   Entamoeba histolytica NOT DETECTED NOT DETECTED Final   Giardia lamblia NOT DETECTED NOT DETECTED Final   Adenovirus F40/41 NOT DETECTED NOT DETECTED Final   Astrovirus NOT DETECTED NOT DETECTED Final   Norovirus GI/GII NOT DETECTED NOT DETECTED Final   Rotavirus A NOT DETECTED NOT DETECTED Final   Sapovirus (I, II, IV, and V) NOT DETECTED NOT DETECTED Final  C difficile quick screen w PCR reflex     Status: None   Collection Time: 05/18/16  7:56 PM  Result Value Ref Range Status   C Diff antigen NEGATIVE NEGATIVE Final   C Diff toxin NEGATIVE NEGATIVE Final   C Diff interpretation No C. difficile detected.  Final     Studies: No results found.  Scheduled Meds: . balsalazide  2,250 mg Oral TID  . chlorhexidine  15 mL Mouth Rinse BID  . digoxin  0.125 mg Oral Daily  . diphenoxylate-atropine  1 tablet Oral BID  . feeding supplement (OSMOLITE 1.5 CAL)  237 mL Per Tube QID  . free water  150 mL Per Tube BID  . levETIRAcetam  1,500 mg Per Tube BID  . levothyroxine  50 mcg Oral QPM  . mouth rinse  15 mL Mouth Rinse q12n4p  . methylPREDNISolone (SOLU-MEDROL) injection  20 mg Intravenous Q12H  . multivitamin  15 mL Per Tube Daily  . PHENobarbital  97.2 mg Oral q morning - 10a  . pravastatin  40 mg Oral q1800   Continuous Infusions: . sodium chloride 125 mL/hr at 05/19/16 1739  . sodium chloride 1,000 mL (05/20/16 1057)    Principal Problem:   Rectal bleeding Active  Problems:   UC (ulcerative colitis) ([place])   Larynx cancer ([place])     [name]  [facility] Pager [phone]  If 7PM-7AM, please contact night-coverage at [email or web address], password [facility] 05/20/2016, 11:53 AM  LOS: 3 days

## 2016-05-20 NOTE — Telephone Encounter (Signed)
Spouse dropped by office stating it is time for parking placard to be renewed.  Please complete form and call for pick up.

## 2016-05-20 NOTE — Evaluation (Signed)
Clinical/Bedside Swallow Evaluation Patient Details  Name: [name] MRN: [id number] Date of Birth: Feb 10, 1941  Today's Date: 05/20/2016 Time: SLP Start Time (ACUTE ONLY): [id number] SLP Stop Time (ACUTE ONLY): 1438 SLP Time Calculation (min) (ACUTE ONLY): 44 min  Past Medical History:  Past Medical History:  Diagnosis Date  . 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 Anxiety   . Arthritis   . C. difficile colitis   . Cancer of larynx ([place])   . Cellulitis   . Cerebrovascular disease 2000  . Dementia   . Diverticulosis of colon (without mention of hemorrhage)   . Gait disorder   . H/O alcohol abuse   . History of UTI 10-2012  . Hyperlipidemia    takes Pravastatin daily  . Hypertension   . Hypothyroidism    takes Levothyroxine daily  . Joint pain   . Joint swelling   . Mild dysplasia of cervix   . 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 Peripheral vascular disease ([place])   . Permanent atrial fibrillation ([place])   . Personal history of colonic polyps    adenomatous 1997 & tubular adenoma and hyplastic  2008  . Pneumonia 2/16  . Redundant colon   . Seizures ([place])    takes Phenobarbital and Keppra daily;last seizure was April 1,2014  . 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 Stroke ([place]) 2000   off balance on right side a little and peripheral vision loss on right side  . Subdural hematoma ([place]) 1990s   s/p fall in bathtub  . Ulcerative colitis    takes [place] daily  . Urinary incontinence   . UTI (lower urinary tract infection)    Past Surgical History:  Past Surgical History:  Procedure Laterality Date  . BREAST LUMPECTOMY     left breast with radiation therapy  . CAROTID ENDARTERECTOMY     left  . CATARACT EXTRACTION, BILATERAL    . COLONOSCOPY    . FLEXIBLE SIGMOIDOSCOPY N/A 06/21/2013   Procedure: FLEXIBLE SIGMOIDOSCOPY;  Surgeon: Jerene Bears, MD;  Location: [facility];  Service: Gastroenterology;  Laterality: N/A;  . HAMMER TOE SURGERY Right 03/2013   pins  and screws  . HARDWARE REMOVAL Right 11/14/2013   Procedure: RIGHT SHOUDER HARDWARE REMOVAL;  Surgeon: Nita Sells, MD;  Location: [place];  Service: Orthopedics;  Laterality: Right;  . INCISION AND DRAINAGE Right 11/14/2013   Procedure: INCISION AND DRAINAGE;  Surgeon: Nita Sells, MD;  Location: [place];  Service: Orthopedics;  Laterality: Right;  . LARYNX SURGERY  08/2010   [facility] x 2  . MASTECTOMY     Right, history with nodule dissection  . MULTIPLE TOOTH EXTRACTIONS     due to oral cancer  . Oral Surgery for squamous cell carcinoma of the mouth     x 3  . ORIF HIP FRACTURE  Sept '12   Right hip: screw and plate repair.  . ORIF HUMERUS FRACTURE Right 10/30/2012   Procedure: OPEN REDUCTION INTERNAL FIXATION (ORIF) HUMERAL SHAFT FRACTURE;  Surgeon: Hessie Dibble, MD;  Location: [place] [facility];  Service: Orthopedics;  Laterality: Right;  BIG C-ARM, SHOULDER POSITION  . SHOULDER SURGERY  10/2013  . SUBDURAL HEMATOMA EVACUATION VIA CRANIOTOMY    . TUBAL LIGATION     HPI:  75 y.o.femalewith medical history significant of UC, C.Diff, laryngeal cancer with total laryngectomy 2013. ETOH abuse, CVA (2000). On 10/31017 pt had surgery for carcinoma of tongue, cancer of floor of mouth.  Currently has NGT. Patient admitted to [facility] due to blood in stools/diarrhea. P. CXR Slight hyperinflation of the lungs without acute pneumonic consolidation or CHF   Assessment / Plan / Recommendation Clinical Impression  Pt had total laryngectomy 2013 tolerating softer textures/thin liquids until 10/31 following oral resection for cancer of floor of mouth and tongue. Currently has NT and assessment today to assess oral ability to initiate po's (cannot aspirate due to total laryngectomy). Lingual movement significantly limited and unable to protrude tongue. Pt able to manipulate puree textures with extra time and anterior labial spill. Liquids consumed via spoon or cup with head extension due to absent air  in upper airway/oral cavity preventing pressure needed to suck or pull liquids in. Recommend Dys 1 (puree) texture, thin liquids, crush pills. Brief follow up for continued education.     Aspiration Risk  No limitations    Diet Recommendation Dysphagia 1 (Puree);Thin liquid   Liquid Administration via: Cup;Spoon Medication Administration: Crushed with puree Supervision: Patient able to self feed Postural Changes: Seated upright at 90 degrees    Other  Recommendations Oral Care Recommendations: Oral care BID   Follow up Recommendations Home health SLP      Frequency and Duration min 2x/week  1 week       Prognosis Prognosis for Safe Diet Advancement: Good      Swallow Study   General HPI: 75 y.o.femalewith medical history significant of UC, C.Diff, laryngeal cancer with total laryngectomy 2013. ETOH abuse, CVA (2000). On 10/31017 pt had surgery for carcinoma of tongue, cancer of floor of mouth. Currently has NGT. Patient admitted to [facility] due to blood in stools/diarrhea. P. CXR Slight hyperinflation of the lungs without acute pneumonic consolidation or CHF Type of Study: Bedside Swallow Evaluation Diet Prior to this Study: NPO;NG Tube Temperature Spikes Noted: No Respiratory Status: Other (comment) (O2 at stoma site) History of Recent Intubation: No Behavior/Cognition: Alert;Cooperative;Pleasant mood Oral Cavity Assessment: Edema Oral Care Completed by SLP: No Oral Cavity - Dentition:  (plate for posterior- out due to surgery, no lower) Vision:  (no vision in right eye) Self-Feeding Abilities: Able to feed self;Needs set up Patient Positioning: Upright in bed Baseline Vocal Quality:  (total laryngectomy) Volitional Cough:  (unable due to laryngectomy) Volitional Swallow: Able to elicit    Oral/Motor/Sensory Function Overall Oral Motor/Sensory Function: Severe impairment (decreased lingual ROM) Facial ROM:  (decreased ) Facial Symmetry:  (due to edema) Facial Strength:   (decreased) Lingual ROM:  (very limited ROM) Lingual Strength: Reduced   Ice Chips Ice chips: Impaired Presentation: Spoon Oral Phase Impairments: Reduced labial seal;Reduced lingual movement/coordination   Thin Liquid Thin Liquid: Impaired Presentation: Cup;Spoon Oral Phase Impairments: Reduced labial seal;Reduced lingual movement/coordination Oral Phase Functional Implications: Right anterior spillage (anterior spill)    Nectar Thick Nectar Thick Liquid: Not tested   Honey Thick Honey Thick Liquid: Not tested   Puree Puree: Impaired Presentation: Spoon Oral Phase Impairments: Reduced labial seal;Reduced lingual movement/coordination Oral Phase Functional Implications: Right anterior spillage   Solid   GO   Solid: Not tested        Houston Siren 05/20/2016,3:10 PM   Orbie Pyo Colvin Caroli.Ed [employer] [phone]

## 2016-05-20 NOTE — Evaluation (Signed)
Physical Therapy Evaluation Patient Details Name: [name] MRN: [id number] DOB: 27-Jan-1941 Today's Date: 05/20/2016   History of Present Illness  75 y.o. female with medical history significant of UC, C.Diff in October, recent hospital admit to [facility] oct 31st for surgery for oral cancer.  Patient released about 1 week ago, repeat C.diff testing on 11/7 was negative . Admitted 11/14.  Clinical Impression  The patient highly motivated to ambualate, 200' x 2. The patient and spouse reports not using Oxygen at home. Patient ambulated on RA, saturation registers were inconsistent. The patient was not SOB. Sat were 98 % after return to room. Pt admitted with above diagnosis. Pt currently with functional limitations due to the deficits listed below (see PT Problem List).  Pt will benefit from skilled PT to increase their independence and safety with mobility to allow discharge to the venue listed below.       Follow Up Recommendations Home health PT;Supervision/Assistance - 24 hour    Equipment Recommendations  None recommended by PT    Recommendations for Other Services       Precautions / Restrictions Precautions Precaution Comments: monitor sats, was on 28% TC in room but spouse and patient state that she did not need it so did not use      Mobility  Bed Mobility Overal bed mobility: Modified Independent                Transfers Overall transfer level: Needs assistance Equipment used: Rolling walker (2 wheeled) Transfers: Sit to/from Stand Sit to Stand: Min assist            Ambulation/Gait Ambulation/Gait assistance: Min assist Ambulation Distance (Feet): 200 Feet (x 2) Assistive device: Rolling walker (2 wheeled) Gait Pattern/deviations: Step-through pattern     General Gait Details: gait is steady, slow speed. Patient  's sats monitored with variable from 60-90's and waveform not smoothe. After return to room and patient sat still, sats immediately  were 98%. Replaced on 28% TC after walking.  Stairs            Wheelchair Mobility    Modified Rankin (Stroke Patients Only)       Balance                                             Pertinent Vitals/Pain Pain Assessment: No/denies pain    Home Living Family/patient expects to be discharged to:: Private residence Living Arrangements: Spouse/significant other Available Help at Discharge: Family Type of Home: House Home Access: Stairs to enter Entrance Stairs-Rails: Left Entrance Stairs-Number of Steps: 4 Home Layout: One level Home Equipment: Environmental consultant - 2 wheels      Prior Function Level of Independence: Needs assistance         Comments: was recently DC'd from [place]        Extremity/Trunk Assessment                         Communication   Communication: Tracheostomy  Cognition Arousal/Alertness: Awake/alert Behavior During Therapy: WFL for tasks assessed/performed Overall Cognitive Status: Within Functional Limits for tasks assessed                      General Comments      Exercises     Assessment/Plan  PT Assessment Patient needs continued PT services  PT Problem List Decreased strength;Decreased activity tolerance;Decreased mobility;Decreased balance;Cardiopulmonary status limiting activity          PT Treatment Interventions DME instruction;Gait training;Functional mobility training;Therapeutic activities;Therapeutic exercise;Patient/family education    PT Goals (Current goals can be found in the Care Plan section)  Acute Rehab PT Goals Patient Stated Goal: to walk PT Goal Formulation: With patient/family Time For Goal Achievement: 06/03/16 Potential to Achieve Goals: Good    Frequency Min 3X/week   Barriers to discharge        Co-evaluation               End of Session   Activity Tolerance: Patient tolerated treatment well Patient left: in bed;with call  bell/phone within reach;with family/visitor present Nurse Communication: Mobility status         Time: [id number] PT Time Calculation (min) (ACUTE ONLY): 24 min   Charges:   PT Evaluation $PT Eval Moderate Complexity: 1 Procedure PT Treatments $Gait Training: 8-22 mins   PT G Codes:        Claretha Cooper 05/20/2016, 5:20 PM

## 2016-05-20 NOTE — Progress Notes (Signed)
Patient ID: [name], female   DOB: Jan 03, 1941, 75 y.o.   MRN: [id number]    Progress Note   Subjective   Feeling better , less diarrhea, no c/o abdomianl pain Tube feeds restarted yesterday - she would like to start trying some pos-says her tongue and lips are swollen still - says her surgeon indicated it would be ok for her to have swallowing eval  here.   Objective   Vital signs in last 24 hours: Temp:  [97.4 F (36.3 C)-97.6 F (36.4 C)] 97.6 F (36.4 C) (11/17 0538) Pulse Rate:  [42-81] 78 (11/16 2245) Resp:  [16-20] 20 (11/17 0538) BP: (121-141)/(52-81) 141/81 (11/17 0538) SpO2:  [77 %-99 %] 99 % (11/17 0840) Weight:  [115 lb 3.2 oz (52.3 kg)] 115 lb 3.2 oz (52.3 kg) (11/16 1900) Last BM Date: 05/19/16 General:   Elderly  white female in NAD, left face /neck deformity  Heart:  Regular rate and rhythm; no murmurs Lungs: Respirations even and unlabored, lungs CTA bilaterally, trached Abdomen:  Soft, nontender and nondistended. Normal bowel sounds. Extremities:  Without edema. Neurologic:  Alert and oriented,  grossly normal neurologically. Psych:  Cooperative. Normal mood and affect.  Intake/Output from previous day: 11/16 0701 - 11/17 0700 In: 387 [NG/GT:387] Out: -  Intake/Output this shift: No intake/output data recorded.  Lab Results:  Recent Labs  05/17/16 1843 05/18/16 0413 05/19/16 0422  WBC 17.9* 14.6* 12.2*  HGB 10.7* 9.9* 10.3*  HCT 31.9* 29.6* 30.8*  PLT 277 257 275   BMET  Recent Labs  05/17/16 1843 05/18/16 0413  NA 134* 135  K 3.7 3.5  CL 93* 97*  CO2 31 31  GLUCOSE 92 123*  BUN 13 16  CREATININE 0.55 0.50  CALCIUM 9.0 8.7*   LFT  Recent Labs  05/17/16 1843  PROT 7.1  ALBUMIN 3.7  AST 17  ALT 13*  ALKPHOS 88  BILITOT 0.7   PT/INR  Recent Labs  05/17/16 1843  LABPROT 15.0  INR 1.17    Studies/Results: No results found.     Assessment / Plan:    #1 75 yo female with ulcerative colitis admitted with  worsening diarrhea and rectal bleeding - had been off oral meds due to recent head and neck surgery  Cdiff negative  On IV steroids  With improvement Have also switched to balsalazide  Which can be opened and put through feeding tube  HGB stable  #2 Enteral feeds- restarted yesterday, tolerating well #3 s/p  Floor of mouth resection and selective neck dissection 10/31 /2017  ([facility] ) for oral cancer  Was to have swallowing eval this week -  #4 hx Laryngeal CA -  Permanent trach  Plan; Continue IV Solumedrol 40 /day  Will request speech path eval   Principal Problem:   Rectal bleeding Active Problems:   UC (ulcerative colitis) ([place])   Larynx cancer ([employer])     LOS: 3 days   [name]  05/20/2016, 10:06 AM

## 2016-05-20 NOTE — Telephone Encounter (Signed)
lmom that form will be ready for pick up on Monday.  Advised to call for confirmation that I have placed it up front. I will document once PCP has returned it signed.

## 2016-05-21 ENCOUNTER — Encounter (HOSPITAL_COMMUNITY): Payer: Self-pay | Admitting: *Deleted

## 2016-05-21 DIAGNOSIS — C329 Malignant neoplasm of larynx, unspecified: Secondary | ICD-10-CM | POA: Diagnosis not present

## 2016-05-21 DIAGNOSIS — K51911 Ulcerative colitis, unspecified with rectal bleeding: Secondary | ICD-10-CM | POA: Diagnosis not present

## 2016-05-21 DIAGNOSIS — K625 Hemorrhage of anus and rectum: Secondary | ICD-10-CM | POA: Diagnosis not present

## 2016-05-21 DIAGNOSIS — Z8619 Personal history of other infectious and parasitic diseases: Secondary | ICD-10-CM | POA: Diagnosis not present

## 2016-05-21 LAB — COMPREHENSIVE METABOLIC PANEL
ALT: 17 U/L (ref 14–54)
AST: 21 U/L (ref 15–41)
Albumin: 3.8 g/dL (ref 3.5–5.0)
Alkaline Phosphatase: 82 U/L (ref 38–126)
Anion gap: 7 (ref 5–15)
BUN: 7 mg/dL (ref 6–20)
CHLORIDE: 100 mmol/L — AB (ref 101–111)
CO2: 29 mmol/L (ref 22–32)
CREATININE: 0.48 mg/dL (ref 0.44–1.00)
Calcium: 8.6 mg/dL — ABNORMAL LOW (ref 8.9–10.3)
GFR calc non Af Amer: >60 mL/min (ref >60)
Glucose, Bld: 194 mg/dL — ABNORMAL HIGH (ref 65–99)
POTASSIUM: 3.7 mmol/L (ref 3.5–5.1)
SODIUM: 136 mmol/L (ref 135–145)
Total Bilirubin: 0.6 mg/dL (ref 0.3–1.2)
Total Protein: 6.8 g/dL (ref 6.5–8.1)

## 2016-05-21 LAB — CBC
HCT: 31.7 % — ABNORMAL LOW (ref 36.0–46.0)
Hemoglobin: 10.6 g/dL — ABNORMAL LOW (ref 12.0–15.0)
MCH: 33.8 pg (ref 26.0–34.0)
MCHC: 33.4 g/dL (ref 30.0–36.0)
MCV: 101 fL — ABNORMAL HIGH (ref 78.0–100.0)
PLATELETS: 254 10*3/uL (ref 150–400)
RBC: 3.14 MIL/uL — AB (ref 3.87–5.11)
RDW: 16.8 % — AB (ref 11.5–15.5)
WBC: 12.7 10*3/uL — AB (ref 4.0–10.5)

## 2016-05-21 MED ORDER — FREE WATER: 150.0000 mL | Freq: Two times a day (BID) | Status: DC

## 2016-05-21 MED ORDER — BALSALAZIDE DISODIUM 750 MG PO CAPS: 2250.0000 mg | ORAL_CAPSULE | Freq: Three times a day (TID) | ORAL | 0 refills | Status: DC

## 2016-05-21 MED ORDER — PREDNISONE 5 MG PO TABS: ORAL_TABLET | ORAL | 0 refills | Status: DC

## 2016-05-21 MED ORDER — PREDNISONE 5 MG/ML PO CONC
20.0000 mg | Freq: Every day | ORAL | Status: DC
  Administered 2016-05-21: 20 mg via ORAL
  Filled 2016-05-21: qty 4

## 2016-05-21 NOTE — Care Management Note (Signed)
Case Management Note  Patient Details  Name: [name] MRN: [id number] Date of Birth: Sep 28, 1940  Subjective/Objective:  Rectal bleeding, laryngeal cancer                 Action/Plan: Discharge Planning: AVS reviewed: NCM spoke to husband at bedside. States he has feeding at home. He does give feedings at home. Has RW, bedside commode and oxygen at home. Contacted [facility] to make aware of dc home today. Will fax orders to agency.   PCP Janith Lima MD  Expected Discharge Date:  05/21/2016             Expected Discharge Plan:  [place]  In-House Referral:  NA  Discharge planning Services  CM Consult  Post Acute Care Choice:  Home Health, Resumption of Svcs/PTA Provider Choice offered to:  Spouse  DME Arranged:  N/A DME Agency:  NA  HH Arranged:  RN, PT, Nurse's Aide [place] Agency:  [place] ([facility])  Status of Service:  Completed, signed off  If discussed at [place] of Stay Meetings, dates discussed:    Additional Comments:  Erenest Rasher, RN 05/21/2016, 1:19 PM

## 2016-05-21 NOTE — Progress Notes (Signed)
Reviewed discharge instructions with pt and husband at bedside.  Pt's husband manages meds at home, verbalized understanding of new meds and d/c'd meds--asked appropriate questions.  Reviewed when to call MD, f/u appts, handout for dysphagia diet 1 provided.  Belongings packed, d/c via WC, home with husband.

## 2016-05-21 NOTE — Care Management Important Message (Signed)
Important Message  Patient Details  Name: ARZELIA HARVIN MRN: [id number] Date of Birth: 02-Apr-1941   Medicare Important Message Given:  Yes    Erenest Rasher, RN 05/21/2016, 1:06 PM

## 2016-05-21 NOTE — Discharge Summary (Signed)
[name], is a 75 y.o. female  DOB [birth year]  MRN [id number].  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 Anxiety   . Arthritis   . C. difficile colitis   . Cancer of larynx ([place])   . Cellulitis   . Cerebrovascular disease 2000  . Dementia   . Diverticulosis of colon (without mention of hemorrhage)   . Gait disorder   . H/O alcohol abuse   . History of UTI 10-2012  . Hyperlipidemia    takes Pravastatin daily  . Hypertension   . Hypothyroidism    takes Levothyroxine daily  . Joint pain   . Joint swelling   . Mild dysplasia of cervix   . 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 Peripheral vascular disease ([place])   . Permanent atrial fibrillation ([place])   . Personal history of colonic polyps    adenomatous 1997 & tubular adenoma and hyplastic  2008  . Pneumonia 2/16  . Redundant colon   . Seizures ([place])    takes Phenobarbital and Keppra daily;last seizure was April 1,2014  . 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 Stroke ([place]) 2000   off balance on right side a little and peripheral vision loss on right side  . Subdural hematoma ([place]) 1990s   s/p fall in bathtub  . Ulcerative colitis    takes [place] daily  . Urinary incontinence   . UTI (lower urinary tract infection)     Past  Surgical History:  Procedure Laterality Date  . BREAST LUMPECTOMY     left breast with radiation therapy  . CAROTID ENDARTERECTOMY     left  . CATARACT EXTRACTION, BILATERAL    . COLONOSCOPY    . FLEXIBLE SIGMOIDOSCOPY N/A 06/21/2013   Procedure: FLEXIBLE SIGMOIDOSCOPY;  Surgeon: Jerene Bears, MD;  Location: [facility];  Service: Gastroenterology;  Laterality: N/A;  . HAMMER TOE SURGERY Right 03/2013   pins and screws  . HARDWARE REMOVAL Right 11/14/2013   Procedure: RIGHT SHOUDER HARDWARE REMOVAL;  Surgeon: Nita Sells, MD;  Location: [facility];  Service: Orthopedics;  Laterality: Right;  . INCISION AND DRAINAGE Right 11/14/2013   Procedure: INCISION AND DRAINAGE;  Surgeon: Nita Sells, MD;  Location: [facility];  Service: Orthopedics;  Laterality: Right;  . LARYNX SURGERY  08/2010   [facility] x 2  . MASTECTOMY  Right, history with nodule dissection  . MULTIPLE TOOTH EXTRACTIONS     due to oral cancer  . Oral Surgery for squamous cell carcinoma of the mouth     x 3  . ORIF HIP FRACTURE  Sept '12   Right hip: screw and plate repair.  . ORIF HUMERUS FRACTURE Right 10/30/2012   Procedure: OPEN REDUCTION INTERNAL FIXATION (ORIF) HUMERAL SHAFT FRACTURE;  Surgeon: Hessie Dibble, MD;  Location: [facility];  Service: Orthopedics;  Laterality: Right;  BIG C-ARM, SHOULDER POSITION  . SHOULDER SURGERY  10/2013  . SUBDURAL HEMATOMA EVACUATION VIA CRANIOTOMY    . TUBAL LIGATION         History of present illness and  Hospital Course:     Kindly see H&P for history of present illness and admission details, please review complete Labs, Consult reports and Test reports for all details in brief  HPI  from the history and physical done on the day of admission 05/17/2016 HPI: [name] is a 75 y.o. female with medical history significant of UC, C.Diff in October, recent hospital admit to [facility] oct 31st for surgery for oral cancer.  Patient released about 1 week ago, repeat  C.diff testing on 11/7 was negative though she was having diarrhea on discharge.  Past couple of days she has had increasing diarrhea which ultimately turned bloody.  She has been sent in to the ED.  Regarding her UC: patient is now being fed through a feeding tube, and as such due to her melsalamine being an extended release tab that cannot be crushed she has been off of this since Oct 30th.  She has only been taking prednisone 5mg  daily for UC.  Before coming to ED today they called Dr. Vena Rua office who recommended increasing prednisone to 20mg  daily.  ED Course: Stool sample sent,  WBC 17k.  HGB 10.7 which is actually up from 9.6 at time of discharge from [facility] last week.  Hospital Course   UC flare with rectal bleed - GI consult appreciated, treated with IV steroids during hospital stay,  meslamine  changed to balsalazide Which can be opened and put through feeding tube . She reports improvement of her diarrhea, hemoglobin remained stable, patient has been cleared for discharge per GI on slow prednisone taper ,prednisone 20 mg daily.  Slow taper by 5 mg every 2 weeks until down to 5.  Office followup in the next 2 weeks for continuity, and  balsalazide 2.25 g TID (this can be opened and sprinkled on pudding for admin).  This med replaces her Lialda for now   s/p oral cancer surgery/external feeds -tube feedings dependent on admission, seen by SLP, for dysphagia 1 with thin liquid. - Defer nasoenteric TF decision to her [facility] surgical oncology team  History of laryngeal cancer - Patient with permanent trach, followed at [facility] -continue statins  hx of seizure disorder: -will continue keppra and phenobarbital  hypothyroidism -will continue synthroid   severe protein calorie malnutrition -Continue supplement     Discharge Condition:  stable   Follow UP  Follow-up Information    Scarlette Calico, MD Follow up in 1 week(s).   Specialty:  Internal  Medicine Contact information: [address] [address] [phone]             Discharge Instructions  and  Discharge Medications     Discharge Instructions    Discharge instructions    Complete by:  As  directed    Follow with Primary MD Scarlette Calico, MD in 7 days   Get CBC, CMP,  checked  by Primary MD next visit.    Activity: As tolerated with Full fall precautions use walker/cane & assistance as needed   Disposition Home    Diet: Dysphagia 1 with thin liquid , with feeding assistance and aspiration precautions.  For Heart failure patients - Check your Weight same time everyday, if you gain over 2 pounds, or you develop in leg swelling, experience more shortness of breath or chest pain, call your Primary MD immediately. Follow Cardiac Low Salt Diet and 1.5 lit/day fluid restriction.   On your next visit with your primary care physician please Get Medicines reviewed and adjusted.   Please request your Prim.MD to go over all Hospital Tests and Procedure/Radiological results at the follow up, please get all Hospital records sent to your Prim MD by signing hospital release before you go home.   If you experience worsening of your admission symptoms, develop shortness of breath, life threatening emergency, suicidal or homicidal thoughts you must seek medical attention immediately by calling 911 or calling your MD immediately  if symptoms less severe.  You Must read complete instructions/literature along with all the possible adverse reactions/side effects for all the Medicines you take and that have been prescribed to you. Take any new Medicines after you have completely understood and accpet all the possible adverse reactions/side effects.   Do not drive, operating heavy machinery, perform activities at heights, swimming or participation in water activities or provide baby sitting services if your were admitted for syncope or siezures until you  have seen by Primary MD or a Neurologist and advised to do so again.  Do not drive when taking Pain medications.    Do not take more than prescribed Pain, Sleep and Anxiety Medications  Special Instructions: If you have smoked or chewed Tobacco  in the last 2 yrs please stop smoking, stop any regular Alcohol  and or any Recreational drug use.  Wear Seat belts while driving.   Please note  You were cared for by a hospitalist during your hospital stay. If you have any questions about your discharge medications or the care you received while you were in the hospital after you are discharged, you can call the unit and asked to speak with the hospitalist on call if the hospitalist that took care of you is not available. Once you are discharged, your primary care physician will handle any further medical issues. Please note that NO REFILLS for any discharge medications will be authorized once you are discharged, as it is imperative that you return to your primary care physician (or establish a relationship with a primary care physician if you do not have one) for your aftercare needs so that they can reassess your need for medications and monitor your lab values.   Increase activity slowly    Complete by:  As directed        Medication List    STOP taking these medications   furosemide 20 MG tablet Commonly known as:  LASIX   Mesalamine 800 MG Tbec     TAKE these medications   acetylcysteine 10 % nebulizer solution Commonly known as:  MUCOMYST Take 1-2 mLs by nebulization 2 (two) times daily as needed (for thick secretions in trachea).   aspirin EC 81 MG tablet Take 81 mg by mouth daily with breakfast.   balsalazide 750 MG capsule Commonly known as:  COLAZAL Take 3 capsules (2,250 mg total) by mouth 3 (three) times daily. this can be opened and sprinkled on pudding for admin   CALCIUM 500 + D PO Take 1 tablet by mouth daily.   digoxin 0.125 MG tablet Commonly known as:   LANOXIN TAKE 1 TABLET (125 MCG TOTAL) BY MOUTH DAILY. What changed:  See the new instructions.   diphenoxylate-atropine 2.5-0.025 MG tablet Commonly known as:  LOMOTIL TAKE 1 TABLET BY MOUTH 4 TIMES A DAY AS NEEDED FOR DIARRHEA OR LOOSE STOOLS What changed:  See the new instructions.   free water Soln Place 150 mLs into feeding tube 2 (two) times daily.   levETIRAcetam 750 MG tablet Commonly known as:  KEPPRA Take 2 tablets (1,500 mg total) by mouth 2 (two) times daily.   levothyroxine 50 MCG tablet Commonly known as:  SYNTHROID, LEVOTHROID TAKE 1 TABLET BY MOUTH ONCE EVERY EVENING   multivitamin with minerals Tabs tablet Take 1 tablet by mouth daily with breakfast.   OSMOLITE Liqd Take by mouth 4 (four) times daily.   PROMOD Liqd Take 1 Bottle by mouth daily.   oxyCODONE 5 MG immediate release tablet Commonly known as:  Oxy IR/ROXICODONE Take 5 mg by mouth 3 (three) times daily as needed for pain.   PHENObarbital 100 MG tablet Commonly known as:  LUMINAL TAKE 1 TABLET BY MOUTH EVERY DAY AT BEDTIME What changed:  See the new instructions.   pravastatin 40 MG tablet Commonly known as:  PRAVACHOL TAKE 1 TABLET (40 MG TOTAL) BY MOUTH EVERY EVENING.   predniSONE 5 MG tablet Commonly known as:  DELTASONE Replaced with 20 mg oral daily for 2 weeks , then 15 mg oral daily for 2 weeks , then 10 mg oral daily for 2 weeks , then continue 5 mg oral daily thereafter . What changed:  See the new instructions.         Diet and Activity recommendation: See Discharge Instructions above   Consults obtained - GI   Major procedures and Radiology Reports - PLEASE review detailed and final reports for all details, in brief -      Dg Chest Portable 1 View  Result Date: 05/17/2016 CLINICAL DATA:  One episode of bloody/mucus stool since waking this morning. EXAM: PORTABLE CHEST 1 VIEW COMPARISON:  11/25/2015 FINDINGS: The cardiac silhouette is top-normal in size. There is  aortic atherosclerosis without aneurysm. Gastric/feeding tube extends below the left hemidiaphragm. The lungs are slightly hyperinflated without acute pneumonic consolidation, CHF nor effusion. Old healing right-sided fifth and sixth rib fractures are noted posteriorly. Bilateral axillary clips are identified with old posttraumatic deformity of the right humeral head and ghost tracks along the proximal right humeral shaft. IMPRESSION: Slight hyperinflation of the lungs without acute pneumonic consolidation or CHF. Feeding tube/gastric tube extends below the left hemidiaphragm. Healing right posterior fifth and sixth rib fractures. Old chronic deformity of the right humeral head with ghost tracks in the proximal humeral shaft. Electronically Signed   By: Ashley Royalty M.D.   On: 05/17/2016 17:08    Micro Results    Recent Results (from the past 240 hour(s))  Gastrointestinal Panel by PCR , Stool     Status: None   Collection Time: 05/17/16  4:53 PM  Result Value Ref Range Status   Campylobacter species NOT DETECTED NOT DETECTED Final   Plesimonas shigelloides NOT DETECTED NOT DETECTED Final   Salmonella species NOT DETECTED NOT DETECTED Final   Yersinia enterocolitica NOT DETECTED NOT DETECTED  Final   Vibrio species NOT DETECTED NOT DETECTED Final   Vibrio cholerae NOT DETECTED NOT DETECTED Final   Enteroaggregative E coli (EAEC) NOT DETECTED NOT DETECTED Final   Enteropathogenic E coli (EPEC) NOT DETECTED NOT DETECTED Final   Enterotoxigenic E coli (ETEC) NOT DETECTED NOT DETECTED Final   Shiga like toxin producing E coli (STEC) NOT DETECTED NOT DETECTED Final   Shigella/Enteroinvasive E coli (EIEC) NOT DETECTED NOT DETECTED Final   Cryptosporidium NOT DETECTED NOT DETECTED Final   Cyclospora cayetanensis NOT DETECTED NOT DETECTED Final   Entamoeba histolytica NOT DETECTED NOT DETECTED Final   Giardia lamblia NOT DETECTED NOT DETECTED Final   Adenovirus F40/41 NOT DETECTED NOT DETECTED Final    Astrovirus NOT DETECTED NOT DETECTED Final   Norovirus GI/GII NOT DETECTED NOT DETECTED Final   Rotavirus A NOT DETECTED NOT DETECTED Final   Sapovirus (I, II, IV, and V) NOT DETECTED NOT DETECTED Final  C difficile quick screen w PCR reflex     Status: None   Collection Time: 05/18/16  7:56 PM  Result Value Ref Range Status   C Diff antigen NEGATIVE NEGATIVE Final   C Diff toxin NEGATIVE NEGATIVE Final   C Diff interpretation No C. difficile detected.  Final       Today   Subjective:   Reatha Armour today has no headache,no chest or abdominal pain, reports diarrhea, but significantly improves from baseline, denies any blood in stool .  Objective:   Blood pressure (!) 144/72, pulse 81, temperature 97.7 F (36.5 C), temperature source Axillary, resp. rate 18, height 5\' 8"  (1.727 m), weight 52.3 kg (115 lb 3.2 oz), SpO2 92 %.   Intake/Output Summary (Last 24 hours) at 05/21/16 1209 Last data filed at 05/21/16 0854  Gross per 24 hour  Intake             1339 ml  Output                0 ml  Net             1339 ml    Exam   General:   Patient with Permanent tracheostomy , awake alert and appropriate   Cardiovascular: S1 and S2, no rubs, no gallops  Respiratory: scattered rhonchi, no wheezing or crackles appreciated, + perm trach  Abdomen: soft, ND, mild mid adb discomfort with deep palpation, positive BS, no guarding.  Musculoskeletal: some arthritic deformities on her hands, no swelling, no cyanoisis.  Data Review   CBC w Diff: Lab Results  Component Value Date   WBC 12.7 (H) 05/21/2016   HGB 10.6 (L) 05/21/2016   HCT 31.7 (L) 05/21/2016   PLT 254 05/21/2016   LYMPHOPCT 10 05/17/2016   BANDSPCT 0 03/18/2012   MONOPCT 6 05/17/2016   EOSPCT 2 05/17/2016   BASOPCT 0 05/17/2016    CMP: Lab Results  Component Value Date   NA 136 05/21/2016   K 3.7 05/21/2016   CL 100 (L) 05/21/2016   CO2 29 05/21/2016   BUN 7 05/21/2016   CREATININE 0.48 05/21/2016    PROT 6.8 05/21/2016   ALBUMIN 3.8 05/21/2016   BILITOT 0.6 05/21/2016   ALKPHOS 82 05/21/2016   AST 21 05/21/2016   ALT 17 05/21/2016  .   Total Time in preparing paper work, data evaluation and todays exam - 35 minutes  Dyon Rotert M.D on 05/21/2016 at 12:09 PM  [facility]   Office  [phone]

## 2016-05-21 NOTE — Progress Notes (Signed)
    Progress Note   Subjective  Feeling better Cleared for dysphagia 1 diet and thin liquids No further bleeding She wants to go home No labs today yet   Objective  Vital signs in last 24 hours: Temp:  [97.7 F (36.5 C)-98.2 F (36.8 C)] 97.7 F (36.5 C) (11/18 0521) Pulse Rate:  [71-86] 81 (11/18 0521) Resp:  [16-18] 18 (11/18 0521) BP: (127-144)/(72-81) 144/72 (11/18 0521) SpO2:  [91 %-100 %] 91 % (11/18 0521) Last BM Date: 05/20/16  Gen: awake, alert, NAD, chronically ill appearing HEENT: anicteric, stoma covered CV: RRR, no mrg Pulm: CTA b/l Abd: soft, NT/ND, +BS throughout Ext: no c/c/e Neuro: nonfocal   Intake/Output from previous day: 11/17 0701 - 11/18 0700 In: 1099 [I.V.:425; NG/GT:674] Out: -  Intake/Output this shift: Total I/O In: 240 [P.O.:240] Out: -   Lab Results:  Recent Labs  05/19/16 0422  WBC 12.2*  HGB 10.3*  HCT 30.8*  PLT 275      Assessment & Plan  75 yo female with UC here with flare in setting of no 5-ASA due to recent head and neck surgery for SCC  1. UC flare -- improved.  Convert to prednisone 20 mg daily.  Slow taper by 5 mg every 2 weeks until down to 5.  Office followup in the next 2 weeks for continuity. --Home with balsalazide 2.25 g TID (this can be opened and sprinkled on pudding for admin).  This med replaces her Lialda for now  2. Nutrition/head and neck cancer -- has followup at [facility].  Speech cleared for dysphagia 1 diet and thin liquids.  This is good news.  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

## 2016-05-21 NOTE — Discharge Instructions (Signed)
Follow with Primary MD Scarlette Calico, MD in 7 days   Get CBC, CMP,  checked  by Primary MD next visit.    Activity: As tolerated with Full fall precautions use walker/cane & assistance as needed   Disposition Home    Diet: Dysphagia 1 with thin liquid , with feeding assistance and aspiration precautions.  For Heart failure patients - Check your Weight same time everyday, if you gain over 2 pounds, or you develop in leg swelling, experience more shortness of breath or chest pain, call your Primary MD immediately. Follow Cardiac Low Salt Diet and 1.5 lit/day fluid restriction.   On your next visit with your primary care physician please Get Medicines reviewed and adjusted.   Please request your Prim.MD to go over all Hospital Tests and Procedure/Radiological results at the follow up, please get all Hospital records sent to your Prim MD by signing hospital release before you go home.   If you experience worsening of your admission symptoms, develop shortness of breath, life threatening emergency, suicidal or homicidal thoughts you must seek medical attention immediately by calling 911 or calling your MD immediately  if symptoms less severe.  You Must read complete instructions/literature along with all the possible adverse reactions/side effects for all the Medicines you take and that have been prescribed to you. Take any new Medicines after you have completely understood and accpet all the possible adverse reactions/side effects.   Do not drive, operating heavy machinery, perform activities at heights, swimming or participation in water activities or provide baby sitting services if your were admitted for syncope or siezures until you have seen by Primary MD or a Neurologist and advised to do so again.  Do not drive when taking Pain medications.    Do not take more than prescribed Pain, Sleep and Anxiety Medications  Special Instructions: If you have smoked or chewed Tobacco  in the  last 2 yrs please stop smoking, stop any regular Alcohol  and or any Recreational drug use.  Wear Seat belts while driving.   Please note  You were cared for by a hospitalist during your hospital stay. If you have any questions about your discharge medications or the care you received while you were in the hospital after you are discharged, you can call the unit and asked to speak with the hospitalist on call if the hospitalist that took care of you is not available. Once you are discharged, your primary care physician will handle any further medical issues. Please note that NO REFILLS for any discharge medications will be authorized once you are discharged, as it is imperative that you return to your primary care physician (or establish a relationship with a primary care physician if you do not have one) for your aftercare needs so that they can reassess your need for medications and monitor your lab values.

## 2016-05-21 NOTE — Progress Notes (Signed)
Pt has been removing trach collar oxygen and not wearing oxygen most of AM.  Confirmed oxygen levels while using BSC, oxgyen levels remained above 90%--mostly 91%-91% with activity.  Recovered to 94%-95%  when at rest.

## 2016-05-22 DIAGNOSIS — I69351 Hemiplegia and hemiparesis following cerebral infarction affecting right dominant side: Secondary | ICD-10-CM | POA: Diagnosis not present

## 2016-05-22 DIAGNOSIS — C139 Malignant neoplasm of hypopharynx, unspecified: Secondary | ICD-10-CM | POA: Diagnosis not present

## 2016-05-22 DIAGNOSIS — C029 Malignant neoplasm of tongue, unspecified: Secondary | ICD-10-CM | POA: Diagnosis not present

## 2016-05-22 DIAGNOSIS — Z930 Tracheostomy status: Secondary | ICD-10-CM | POA: Diagnosis not present

## 2016-05-22 DIAGNOSIS — E440 Moderate protein-calorie malnutrition: Secondary | ICD-10-CM | POA: Diagnosis not present

## 2016-05-22 DIAGNOSIS — G40909 Epilepsy, unspecified, not intractable, without status epilepticus: Secondary | ICD-10-CM | POA: Diagnosis not present

## 2016-05-22 DIAGNOSIS — C329 Malignant neoplasm of larynx, unspecified: Secondary | ICD-10-CM | POA: Diagnosis not present

## 2016-05-22 DIAGNOSIS — Z9981 Dependence on supplemental oxygen: Secondary | ICD-10-CM | POA: Diagnosis not present

## 2016-05-22 DIAGNOSIS — M6281 Muscle weakness (generalized): Secondary | ICD-10-CM | POA: Diagnosis not present

## 2016-05-23 ENCOUNTER — Other Ambulatory Visit: Payer: Self-pay | Admitting: Internal Medicine

## 2016-05-23 ENCOUNTER — Telehealth: Payer: Self-pay

## 2016-05-23 NOTE — Telephone Encounter (Signed)
Pt scheduled to see Amy Esterwood 06/02/16@11am . Pts husband aware of appt date and time.

## 2016-05-23 NOTE — Telephone Encounter (Signed)
[employer] has approved patient's Pentasa 500 mg from 05/20/16 until 07/03/17.

## 2016-05-23 NOTE — Telephone Encounter (Signed)
-----   Message from Jerene Bears, MD sent at 05/21/2016 10:28 AM EST ----- Needs OV with app, Amy prob, in about 2 weeks Recent flare of UC Now on pred and balsalazide JMP

## 2016-05-24 DIAGNOSIS — E440 Moderate protein-calorie malnutrition: Secondary | ICD-10-CM | POA: Diagnosis not present

## 2016-05-24 DIAGNOSIS — I69351 Hemiplegia and hemiparesis following cerebral infarction affecting right dominant side: Secondary | ICD-10-CM | POA: Diagnosis not present

## 2016-05-24 DIAGNOSIS — Z9981 Dependence on supplemental oxygen: Secondary | ICD-10-CM | POA: Diagnosis not present

## 2016-05-24 DIAGNOSIS — G40909 Epilepsy, unspecified, not intractable, without status epilepticus: Secondary | ICD-10-CM | POA: Diagnosis not present

## 2016-05-24 DIAGNOSIS — C139 Malignant neoplasm of hypopharynx, unspecified: Secondary | ICD-10-CM | POA: Diagnosis not present

## 2016-05-24 DIAGNOSIS — Z930 Tracheostomy status: Secondary | ICD-10-CM | POA: Diagnosis not present

## 2016-05-24 DIAGNOSIS — C029 Malignant neoplasm of tongue, unspecified: Secondary | ICD-10-CM | POA: Diagnosis not present

## 2016-05-24 DIAGNOSIS — M6281 Muscle weakness (generalized): Secondary | ICD-10-CM | POA: Diagnosis not present

## 2016-05-24 DIAGNOSIS — C329 Malignant neoplasm of larynx, unspecified: Secondary | ICD-10-CM | POA: Diagnosis not present

## 2016-05-25 ENCOUNTER — Telehealth: Payer: Self-pay | Admitting: Internal Medicine

## 2016-05-25 DIAGNOSIS — Z483 Aftercare following surgery for neoplasm: Secondary | ICD-10-CM | POA: Diagnosis not present

## 2016-05-25 DIAGNOSIS — Z85818 Personal history of malignant neoplasm of other sites of lip, oral cavity, and pharynx: Secondary | ICD-10-CM | POA: Diagnosis not present

## 2016-05-25 DIAGNOSIS — Z9089 Acquired absence of other organs: Secondary | ICD-10-CM | POA: Diagnosis not present

## 2016-05-25 NOTE — Telephone Encounter (Signed)
Pyrtle pt with UC, has feeding tube in place from surgery done at [facility] for oral cancer. Pt was recently in [facility], had brb in stool and diarrhea. States pt had 7-8 diarrhea stools last night. He has been giving Imodium one in the am and one in the pm. This morning he gave her 2 Imodium and so far she has not had a stool since 6:30am. Pt is at post-op appt at [facility] and may get feeding tube removed today. Husband wants to know if there are any other recommendations regarding diarrhea stools. Dr. Loletha Carrow as doc to the day please advise.

## 2016-05-25 NOTE — Telephone Encounter (Signed)
Stool for C difficile toxin, since it looks like she had that before.  Dr Havery Moros on duty through holiday if more assistance needed.

## 2016-05-25 NOTE — Telephone Encounter (Signed)
Husband aware.

## 2016-05-25 NOTE — Telephone Encounter (Signed)
Husband states she has done better today. He plans to give imodium qid.

## 2016-05-30 DIAGNOSIS — Z9981 Dependence on supplemental oxygen: Secondary | ICD-10-CM | POA: Diagnosis not present

## 2016-05-30 DIAGNOSIS — Z930 Tracheostomy status: Secondary | ICD-10-CM | POA: Diagnosis not present

## 2016-05-30 DIAGNOSIS — E440 Moderate protein-calorie malnutrition: Secondary | ICD-10-CM | POA: Diagnosis not present

## 2016-05-30 DIAGNOSIS — C029 Malignant neoplasm of tongue, unspecified: Secondary | ICD-10-CM | POA: Diagnosis not present

## 2016-05-30 DIAGNOSIS — I69351 Hemiplegia and hemiparesis following cerebral infarction affecting right dominant side: Secondary | ICD-10-CM | POA: Diagnosis not present

## 2016-05-30 DIAGNOSIS — C329 Malignant neoplasm of larynx, unspecified: Secondary | ICD-10-CM | POA: Diagnosis not present

## 2016-05-30 DIAGNOSIS — G40909 Epilepsy, unspecified, not intractable, without status epilepticus: Secondary | ICD-10-CM | POA: Diagnosis not present

## 2016-05-30 DIAGNOSIS — M6281 Muscle weakness (generalized): Secondary | ICD-10-CM | POA: Diagnosis not present

## 2016-05-30 DIAGNOSIS — C139 Malignant neoplasm of hypopharynx, unspecified: Secondary | ICD-10-CM | POA: Diagnosis not present

## 2016-05-31 DIAGNOSIS — I69351 Hemiplegia and hemiparesis following cerebral infarction affecting right dominant side: Secondary | ICD-10-CM | POA: Diagnosis not present

## 2016-05-31 DIAGNOSIS — Z930 Tracheostomy status: Secondary | ICD-10-CM | POA: Diagnosis not present

## 2016-05-31 DIAGNOSIS — M6281 Muscle weakness (generalized): Secondary | ICD-10-CM | POA: Diagnosis not present

## 2016-05-31 DIAGNOSIS — Z9981 Dependence on supplemental oxygen: Secondary | ICD-10-CM | POA: Diagnosis not present

## 2016-05-31 DIAGNOSIS — E440 Moderate protein-calorie malnutrition: Secondary | ICD-10-CM | POA: Diagnosis not present

## 2016-05-31 DIAGNOSIS — C029 Malignant neoplasm of tongue, unspecified: Secondary | ICD-10-CM | POA: Diagnosis not present

## 2016-05-31 DIAGNOSIS — C329 Malignant neoplasm of larynx, unspecified: Secondary | ICD-10-CM | POA: Diagnosis not present

## 2016-05-31 DIAGNOSIS — G40909 Epilepsy, unspecified, not intractable, without status epilepticus: Secondary | ICD-10-CM | POA: Diagnosis not present

## 2016-05-31 DIAGNOSIS — C139 Malignant neoplasm of hypopharynx, unspecified: Secondary | ICD-10-CM | POA: Diagnosis not present

## 2016-06-01 ENCOUNTER — Ambulatory Visit (INDEPENDENT_AMBULATORY_CARE_PROVIDER_SITE_OTHER): Payer: PPO | Admitting: Internal Medicine

## 2016-06-01 ENCOUNTER — Encounter: Payer: Self-pay | Admitting: Internal Medicine

## 2016-06-01 VITALS — BP 110/56 | HR 70 | Temp 96.2°F | Resp 14 | Ht 68.0 in | Wt 109.8 lb

## 2016-06-01 DIAGNOSIS — D500 Iron deficiency anemia secondary to blood loss (chronic): Secondary | ICD-10-CM

## 2016-06-01 DIAGNOSIS — I10 Essential (primary) hypertension: Secondary | ICD-10-CM | POA: Diagnosis not present

## 2016-06-01 DIAGNOSIS — E032 Hypothyroidism due to medicaments and other exogenous substances: Secondary | ICD-10-CM | POA: Diagnosis not present

## 2016-06-01 NOTE — Progress Notes (Signed)
Pre visit review using our clinic review tool, if applicable. No additional management support is needed unless otherwise documented below in the visit note. 

## 2016-06-01 NOTE — Progress Notes (Signed)
Subjective:  Patient ID: [name], female    DOB: 1941/06/21  Age: 75 y.o. MRN: [id number]  CC: Anemia and Hypothyroidism   HPI [name] presents for follow-up on anemia and hypothyroidism. 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 Her recovery has been complicated by dehydration, anemia, GI bleeding, and diarrhea. Fortunately today she tells me that she is starting to feel much better. She has not been using oxygen therapy and is tolerating oral intake of food and liquids with very minimal difficulty. There've been no recent episodes of choking, coughing, fever, chills, abdominal pain, or diarrhea.  Outpatient Medications Prior to Visit  Medication Sig Dispense Refill  . acetylcysteine (MUCOMYST) 10 % nebulizer solution Take 1-2 mLs by nebulization 2 (two) times daily as needed (for thick secretions in trachea).    Marland Kitchen aspirin EC 81 MG tablet Take 81 mg by mouth daily with breakfast.    . balsalazide (COLAZAL) 750 MG capsule Take 3 capsules (2,250 mg total) by mouth 3 (three) times daily. this can be opened and sprinkled on pudding for admin 270 capsule 0  . Calcium Carbonate-Vitamin D (CALCIUM 500 + D PO) Take 1 tablet by mouth daily.    . digoxin (LANOXIN) 0.125 MG tablet TAKE 1 TABLET (125 MCG TOTAL) BY MOUTH DAILY. (Patient taking differently: TAKE 1 TABLET (125 MCG TOTAL) BY MOUTH every morning) 90 tablet 1  . diphenoxylate-atropine (LOMOTIL) 2.5-0.025 MG tablet TAKE 1 TABLET BY MOUTH 4 TIMES A DAY AS NEEDED FOR DIARRHEA OR LOOSE STOOLS (Patient taking differently: TAKE 1 TABLET BY MOUTH twice per day) 60 tablet 1  . levETIRAcetam (KEPPRA) 750 MG tablet Take 2 tablets (1,500 mg total) by mouth 2 (two) times daily. 360 tablet 3  . levothyroxine (SYNTHROID, LEVOTHROID) 50 MCG tablet TAKE 1 TABLET BY MOUTH ONCE EVERY EVENING 90 tablet 1  . Multiple Vitamin  (MULTIVITAMIN WITH MINERALS) TABS Take 1 tablet by mouth daily with breakfast.     . Nutritional Supplements (PROMOD) LIQD Take 1 Bottle by mouth daily.    Marland Kitchen oxyCODONE (OXY IR/ROXICODONE) 5 MG immediate release tablet Take 5 mg by mouth 3 (three) times daily as needed for pain.  0  . PHENObarbital (LUMINAL) 100 MG tablet TAKE 1 TABLET BY MOUTH EVERY DAY AT BEDTIME (Patient taking differently: TAKE 1 TABLET BY MOUTH EVERY morning) 90 tablet 1  . pravastatin (PRAVACHOL) 40 MG tablet TAKE 1 TABLET (40 MG TOTAL) BY MOUTH EVERY EVENING. 90 tablet 1  . predniSONE (DELTASONE) 5 MG tablet Replaced with 20 mg oral daily for 2 weeks , then 15 mg oral daily for 2 weeks , then 10 mg oral daily for 2 weeks , then continue 5 mg oral daily thereafter . 120 tablet 0  . Nutritional Supplements (OSMOLITE) LIQD Take by mouth 4 (four) times daily.    . Water For Irrigation, Sterile (FREE WATER) SOLN Place 150 mLs into feeding tube 2 (two) times daily.     No facility-administered medications prior to visit.     ROS Review of Systems  Constitutional: Negative for activity change, appetite change, chills, diaphoresis, fatigue and unexpected weight change.  HENT: Negative.   Eyes: Negative.  Negative for visual disturbance.  Respiratory: Negative for cough, choking, shortness of breath and stridor.   Cardiovascular: Negative for chest pain, palpitations and leg swelling.  Gastrointestinal: Negative for  abdominal pain, constipation, diarrhea, nausea and vomiting.  Endocrine: Negative.   Genitourinary: Negative.   Musculoskeletal: Negative.  Negative for back pain and neck pain.  Skin: Negative.  Negative for color change and rash.  Allergic/Immunologic: Negative.   Neurological: Negative.   Hematological: Negative.  Negative for adenopathy. Does not bruise/bleed easily.  Psychiatric/Behavioral: Negative.     Objective:  BP (!) 110/56 (BP Location: Left Arm, Patient Position: Sitting, Cuff Size: Normal)    Pulse 70   Temp (!) 96.2 F (35.7 C) (Oral)   Resp 14   Ht 5\' 8"  (1.727 m)   Wt 109 lb 12 oz (49.8 kg)   SpO2 98%   BMI 16.69 kg/m   BP Readings from Last 3 Encounters:  06/02/16 122/60  06/01/16 (!) 110/56  05/21/16 (!) 144/72    Wt Readings from Last 3 Encounters:  06/02/16 110 lb (49.9 kg)  06/01/16 109 lb 12 oz (49.8 kg)  05/19/16 115 lb 3.2 oz (52.3 kg)    Physical Exam  Constitutional: She is oriented to person, place, and time. No distress.  HENT:  Mouth/Throat: Oropharynx is clear and moist. No oropharyngeal exudate.  Eyes: Conjunctivae are normal. Right eye exhibits no discharge. Left eye exhibits no discharge. No scleral icterus.  Neck: Normal range of motion. Neck supple. No JVD present. No tracheal deviation present. No thyromegaly present.  Cardiovascular: Normal rate, regular rhythm, normal heart sounds and intact distal pulses.  Exam reveals no gallop and no friction rub.   No murmur heard. Pulmonary/Chest: Effort normal. No accessory muscle usage or stridor. No tachypnea. No respiratory distress. She has no decreased breath sounds. She has no wheezes. She has rhonchi in the right middle field and the left middle field. She has no rales. She exhibits no tenderness.  Abdominal: Soft. Bowel sounds are normal. She exhibits no distension and no mass. There is no tenderness. There is no rebound and no guarding.  Musculoskeletal: Normal range of motion. She exhibits no edema or deformity.  Lymphadenopathy:    She has no cervical adenopathy.  Neurological: She is oriented to person, place, and time.  Skin: Skin is warm and dry. No rash noted. She is not diaphoretic. No erythema. No pallor.  Vitals reviewed.   Lab Results  Component Value Date   WBC 12.7 (H) 05/21/2016   HGB 10.6 (L) 05/21/2016   HCT 31.7 (L) 05/21/2016   PLT 254 05/21/2016   GLUCOSE 194 (H) 05/21/2016   CHOL 176 02/22/2016   TRIG 60.0 02/22/2016   HDL 92.70 02/22/2016   LDLCALC 71 02/22/2016     ALT 17 05/21/2016   AST 21 05/21/2016   NA 136 05/21/2016   K 3.7 05/21/2016   CL 100 (L) 05/21/2016   CREATININE 0.48 05/21/2016   BUN 7 05/21/2016   CO2 29 05/21/2016   TSH 1.44 02/22/2016   INR 1.17 05/17/2016   HGBA1C 5.6 02/22/2016    Dg Chest Portable 1 View  Result Date: 05/17/2016 CLINICAL DATA:  One episode of bloody/mucus stool since waking this morning. EXAM: PORTABLE CHEST 1 VIEW COMPARISON:  11/25/2015 FINDINGS: The cardiac silhouette is top-normal in size. There is aortic atherosclerosis without aneurysm. Gastric/feeding tube extends below the left hemidiaphragm. The lungs are slightly hyperinflated without acute pneumonic consolidation, CHF nor effusion. Old healing right-sided fifth and sixth rib fractures are noted posteriorly. Bilateral axillary clips are identified with old posttraumatic deformity of the right humeral head and ghost tracks along the proximal right humeral shaft. IMPRESSION:  Slight hyperinflation of the lungs without acute pneumonic consolidation or CHF. Feeding tube/gastric tube extends below the left hemidiaphragm. Healing right posterior fifth and sixth rib fractures. Old chronic deformity of the right humeral head with ghost tracks in the proximal humeral shaft. Electronically Signed   By: Ashley Royalty M.D.   On: 05/17/2016 17:08    Assessment & Plan:   Jakea was seen today for anemia and hypothyroidism.  Diagnoses and all orders for this visit:  Essential hypertension- her blood pressure is adequately well-controlled  Hypothyroidism due to medication- I will continue the current dose of levothyroxine  Iron deficiency anemia due to chronic blood loss- improvement noted, she will continue iron replacement therapy.   I have discontinued Ms. Dimond's OSMOLITE and free water. I am also having her maintain her multivitamin with minerals, Calcium Carbonate-Vitamin D (CALCIUM 500 + D PO), levETIRAcetam, PHENObarbital, levothyroxine, pravastatin,  diphenoxylate-atropine, digoxin, oxyCODONE, acetylcysteine, aspirin EC, PROMOD, balsalazide, and predniSONE.  No orders of the defined types were placed in this encounter.    Follow-up: No Follow-up on file.  Scarlette Calico, MD

## 2016-06-02 ENCOUNTER — Ambulatory Visit (INDEPENDENT_AMBULATORY_CARE_PROVIDER_SITE_OTHER): Payer: PPO | Admitting: Physician Assistant

## 2016-06-02 ENCOUNTER — Encounter: Payer: Self-pay | Admitting: Physician Assistant

## 2016-06-02 VITALS — BP 122/60 | HR 70 | Ht 64.0 in | Wt 110.0 lb

## 2016-06-02 DIAGNOSIS — K51911 Ulcerative colitis, unspecified with rectal bleeding: Secondary | ICD-10-CM

## 2016-06-02 NOTE — Patient Instructions (Signed)
Decrease Prednisone to 15 mg by mouth daily x 14 days.  Then 10 mg daily x 14 days. Then 5 mg daily x 14 days.   Continue Balsalazide.   Follow up with Dr. Hilarie Fredrickson or Nicoletta Ba PA as needed.

## 2016-06-02 NOTE — Progress Notes (Addendum)
Subjective:    Patient ID: [name], female    DOB: August 30, 1940, 75 y.o.   MRN: [id number]  HPI [name] is a pleasant 75 year old white female known to Dr. Hilarie Fredrickson with history of ulcerative colitis. 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 She has more recently been diagnosed with an extensive squamous cell oral cancer and is status post resection of the floor of her mouth, partial glossectomy and a rim  mandibulectomy her to 1 2017 at [facility]. She required temporary enteral feedings and was hospitalized here 1114 through 05/21/2016 with an exacerbation of her ulcerative colitis with diarrhea and rectal bleeding. Infectious workup was negative and she was started on IV Solu-Medrol. She improved quickly. It was felt that she most likely flared because she was unable to take her oral mesalamine around the time of her surgery because she was unable to swallow. She has been switched to balsalazide and her husband is opening the capsules and sprinkling them on applesauce in order to dose her currently. She had swallowing evaluation done while she was hospitalized and was approved to start a dysphagia 1 diet with thin liquids and has done well with that. She saw her surgeon at [facility] last week and had the antral feeding tube removed. She was being supplemented with Osmolite in both she and her husband felt that this was contributing to diarrhea. She has done better over the past week, her husband reports that she is usually having 2-3 pudding-like stools per day she has not had any bleeding since hospitalization. She has no complaints of abdominal pain or cramping. She's currently eating pured food and feels that she is swallowing better. She continues on prednisone at 20 mg by mouth daily currently  Review of Systems Pertinent positive and negative review of systems were noted in the above HPI  section.  All other review of systems was otherwise negative.  Outpatient Encounter Prescriptions as of 06/02/2016  Medication Sig  . acetylcysteine (MUCOMYST) 10 % nebulizer solution Take 1-2 mLs by nebulization 2 (two) times daily as needed (for thick secretions in trachea).  Marland Kitchen aspirin EC 81 MG tablet Take 81 mg by mouth daily with breakfast.  . balsalazide (COLAZAL) 750 MG capsule Take 3 capsules (2,250 mg total) by mouth 3 (three) times daily. this can be opened and sprinkled on pudding for admin  . Calcium Carbonate-Vitamin D (CALCIUM 500 + D PO) Take 1 tablet by mouth daily.  . digoxin (LANOXIN) 0.125 MG tablet TAKE 1 TABLET (125 MCG TOTAL) BY MOUTH DAILY. (Patient taking differently: TAKE 1 TABLET (125 MCG TOTAL) BY MOUTH every morning)  . diphenoxylate-atropine (LOMOTIL) 2.5-0.025 MG tablet TAKE 1 TABLET BY MOUTH 4 TIMES A DAY AS NEEDED FOR DIARRHEA OR LOOSE STOOLS (Patient taking differently: TAKE 1 TABLET BY MOUTH twice per day)  . levETIRAcetam (KEPPRA) 750 MG tablet Take 2 tablets (1,500 mg total) by mouth 2 (two) times daily.  Marland Kitchen levothyroxine (SYNTHROID, LEVOTHROID) 50 MCG tablet TAKE 1 TABLET BY MOUTH ONCE EVERY EVENING  . Multiple Vitamin (MULTIVITAMIN WITH MINERALS) TABS Take 1 tablet by mouth daily with breakfast.   . Nutritional Supplements (PROMOD) LIQD Take 1 Bottle by mouth daily.  Marland Kitchen oxyCODONE (OXY IR/ROXICODONE) 5 MG immediate release tablet Take 5 mg by mouth 3 (three) times daily as needed for pain.  Marland Kitchen PHENObarbital (LUMINAL) 100 MG tablet TAKE 1 TABLET BY MOUTH EVERY DAY AT BEDTIME (  Patient taking differently: TAKE 1 TABLET BY MOUTH EVERY morning)  . pravastatin (PRAVACHOL) 40 MG tablet TAKE 1 TABLET (40 MG TOTAL) BY MOUTH EVERY EVENING.  . predniSONE (DELTASONE) 5 MG tablet Replaced with 20 mg oral daily for 2 weeks , then 15 mg oral daily for 2 weeks , then 10 mg oral daily for 2 weeks , then continue 5 mg oral daily thereafter .   No facility-administered encounter  medications on file as of 06/02/2016.    Allergies  Allergen Reactions  . Xarelto [Rivaroxaban]     Suffered a severe bleed  . Diltiazem Other (See Comments)    edema   Patient Active Problem List   Diagnosis Date Noted  . Rectal bleeding 05/17/2016  . Chronic bronchitis ([place]) 11/18/2015  . Osteoporosis of lower leg 04/13/2015  . Senile osteoporosis 04/13/2015  . 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 Protein-calorie malnutrition, severe ([place]) 06/09/2014  . Prediabetes 02/10/2014  . Routine health maintenance 02/05/2013  . Chronic atrial fibrillation ([place]) 01/09/2013  . Encounter for therapeutic drug monitoring 07/19/2012  . Hemiplegia, unspecified, affecting dominant side 03/19/2012  . Seizure ([place]) 03/18/2012  . Leukocytosis 03/18/2012  . Larynx cancer ([place]) 08/18/2011  . Anemia, iron deficiency 05/06/2011  . UC (ulcerative colitis) ([place]) 01/10/2011  . Hypothyroidism 06/23/2007  . Hyperlipidemia with target LDL less than 100 06/23/2007  . Essential hypertension 06/23/2007  . URINARY INCONTINENCE 06/23/2007   Social History   Social History  . Marital status: Married    Spouse name: Marden Noble  . Number of children: 2  . Years of education: 12   Occupational History  . retired Retired    Art therapist   Social History Main Topics  . Smoking status: Former Smoker    Years: 40.00    Quit date: 10/26/2004  . Smokeless tobacco: Never Used     Comment: quit in 10-14-2005  . Alcohol use 0.0 oz/week     Comment: normally 1 glass wine after dinner - occasional  . Drug use: No  . Sexual activity: No   Other Topics Concern  . Not on file   Social History Narrative   HSG. Married. married 10-15-67. 1 son- died as a neonate, 1 son- 10-15-58. Retired- worked as a Art therapist.   End of life: has a living will- does not want futile/ heroic care; no cpr.     Marriage- reports marriage is in good health (4/10)      Patient is left handed.   Patient drinks 3-4 cups of tea  daily.                [name] family history includes Breast cancer in her maternal aunt and sister; Coronary artery disease in her mother; Diabetes in her sister; Heart disease in her mother.      Objective:    Vitals:   06/02/16 1103  BP: 122/60  Pulse: 70    Physical Exam  well-developed elderly white female in no acute distress, pleasant accompanied by her husband. Patient has a trach and also has had extensive head and neck resection on the left. Blood pressure 122/60 pulse 70, height 5 foot 4 weight 110 BMI 18.8 HEENT ; EOMI PERRLA sclera anicteric status post radical neck and jaw surgery with partial glossectomy, Trach in place Cardiovascular; regular rate and rhythm with S1-S2 no murmur or gallop, Pulmonary; clear bilaterally, Abdomen ;soft nontender nondistended bowel sounds are active there is no palpable mass or hepatosplenomegaly, Rectal; exam not done,  Extremities; no clubbing cyanosis or edema skin warm and dry, Neuro ;psych mood and affect appropriate       Assessment & Plan:   #92 75 year old female with recent exacerbation of ulcerative colitis due to inability to take her oral mesalamine because of extensive head and neck surgery. She is currently much improved on oral prednisone and balsalazide #2 mild anemia multifactorial #3 dysphagia-secondary to squamous cell oral cancer status post recent extensive resection as outlined above-tolerating pureed diet  Well #3 history of laryngeal cancer status post laryngectomy and permanent trach #4 history of leukocytoclastic vasculitis #5 malnutrition  Plan; decrease prednisone to 15 mg by mouth daily 2 weeks and then taper by 5 mg every 2 weeks until off Continue balsalazide 2.25g-  3 times daily until she is safely able to swallow pills, then they will to mesalamine tablets 4.2 g daily She will follow up with Dr. Hilarie Fredrickson or myself on an as-needed basis  [name] Genia Harold PA-C 06/02/2016   Cc: Janith Lima,  MD   Addendum: Reviewed and agree with  management. Jerene Bears, MD

## 2016-06-03 DIAGNOSIS — C029 Malignant neoplasm of tongue, unspecified: Secondary | ICD-10-CM | POA: Diagnosis not present

## 2016-06-03 DIAGNOSIS — M6281 Muscle weakness (generalized): Secondary | ICD-10-CM | POA: Diagnosis not present

## 2016-06-03 DIAGNOSIS — Z9981 Dependence on supplemental oxygen: Secondary | ICD-10-CM | POA: Diagnosis not present

## 2016-06-03 DIAGNOSIS — C139 Malignant neoplasm of hypopharynx, unspecified: Secondary | ICD-10-CM | POA: Diagnosis not present

## 2016-06-03 DIAGNOSIS — I69351 Hemiplegia and hemiparesis following cerebral infarction affecting right dominant side: Secondary | ICD-10-CM | POA: Diagnosis not present

## 2016-06-03 DIAGNOSIS — Z930 Tracheostomy status: Secondary | ICD-10-CM | POA: Diagnosis not present

## 2016-06-03 DIAGNOSIS — G40909 Epilepsy, unspecified, not intractable, without status epilepticus: Secondary | ICD-10-CM | POA: Diagnosis not present

## 2016-06-03 DIAGNOSIS — C329 Malignant neoplasm of larynx, unspecified: Secondary | ICD-10-CM | POA: Diagnosis not present

## 2016-06-03 DIAGNOSIS — E440 Moderate protein-calorie malnutrition: Secondary | ICD-10-CM | POA: Diagnosis not present

## 2016-06-05 NOTE — Patient Instructions (Signed)
Hypothyroidism Hypothyroidism is a disorder of the thyroid. The thyroid is a large gland that is located in the lower front of the neck. The thyroid releases hormones that control how the body works. With hypothyroidism, the thyroid does not make enough of these hormones. What are the causes? Causes of hypothyroidism may include:  Viral infections.  Pregnancy.  Your own defense system (immune system) attacking your thyroid.  Certain medicines.  Birth defects.  Past radiation treatments to your head or neck.  Past treatment with radioactive iodine.  Past surgical removal of part or all of your thyroid.  Problems with the gland that is located in the center of your brain (pituitary).  What are the signs or symptoms? Signs and symptoms of hypothyroidism may include:  Feeling as though you have no energy (lethargy).  Inability to tolerate cold.  Weight gain that is not explained by a change in diet or exercise habits.  Dry skin.  Coarse hair.  Menstrual irregularity.  Slowing of thought processes.  Constipation.  Sadness or depression.  How is this diagnosed? Your health care provider may diagnose hypothyroidism with blood tests and ultrasound tests. How is this treated? Hypothyroidism is treated with medicine that replaces the hormones that your body does not make. After you begin treatment, it may take several weeks for symptoms to go away. Follow these instructions at home:  Take medicines only as directed by your health care provider.  If you start taking any new medicines, tell your health care provider.  Keep all follow-up visits as directed by your health care provider. This is important. As your condition improves, your dosage needs may change. You will need to have blood tests regularly so that your health care provider can watch your condition. Contact a health care provider if:  Your symptoms do not get better with treatment.  You are taking thyroid  replacement medicine and: ? You sweat excessively. ? You have tremors. ? You feel anxious. ? You lose weight rapidly. ? You cannot tolerate heat. ? You have emotional swings. ? You have diarrhea. ? You feel weak. Get help right away if:  You develop chest pain.  You develop an irregular heartbeat.  You develop a rapid heartbeat. This information is not intended to replace advice given to you by your health care provider. Make sure you discuss any questions you have with your health care provider. Document Released: 06/20/2005 Document Revised: 11/26/2015 Document Reviewed: 11/05/2013 Elsevier Interactive Patient Education  2017 [employer]vier Inc[employer]  

## 2016-06-07 DIAGNOSIS — C329 Malignant neoplasm of larynx, unspecified: Secondary | ICD-10-CM | POA: Diagnosis not present

## 2016-06-07 DIAGNOSIS — Z930 Tracheostomy status: Secondary | ICD-10-CM | POA: Diagnosis not present

## 2016-06-07 DIAGNOSIS — M6281 Muscle weakness (generalized): Secondary | ICD-10-CM | POA: Diagnosis not present

## 2016-06-07 DIAGNOSIS — Z9981 Dependence on supplemental oxygen: Secondary | ICD-10-CM | POA: Diagnosis not present

## 2016-06-07 DIAGNOSIS — C139 Malignant neoplasm of hypopharynx, unspecified: Secondary | ICD-10-CM | POA: Diagnosis not present

## 2016-06-07 DIAGNOSIS — G40909 Epilepsy, unspecified, not intractable, without status epilepticus: Secondary | ICD-10-CM | POA: Diagnosis not present

## 2016-06-07 DIAGNOSIS — E440 Moderate protein-calorie malnutrition: Secondary | ICD-10-CM | POA: Diagnosis not present

## 2016-06-07 DIAGNOSIS — C029 Malignant neoplasm of tongue, unspecified: Secondary | ICD-10-CM | POA: Diagnosis not present

## 2016-06-07 DIAGNOSIS — I69351 Hemiplegia and hemiparesis following cerebral infarction affecting right dominant side: Secondary | ICD-10-CM | POA: Diagnosis not present

## 2016-06-08 DIAGNOSIS — C049 Malignant neoplasm of floor of mouth, unspecified: Secondary | ICD-10-CM | POA: Diagnosis not present

## 2016-06-08 DIAGNOSIS — C029 Malignant neoplasm of tongue, unspecified: Secondary | ICD-10-CM | POA: Diagnosis not present

## 2016-06-08 DIAGNOSIS — C139 Malignant neoplasm of hypopharynx, unspecified: Secondary | ICD-10-CM | POA: Diagnosis not present

## 2016-06-19 ENCOUNTER — Other Ambulatory Visit: Payer: Self-pay | Admitting: Internal Medicine

## 2016-06-23 ENCOUNTER — Encounter (HOSPITAL_COMMUNITY): Payer: Self-pay | Admitting: Emergency Medicine

## 2016-06-23 DIAGNOSIS — W19XXXA Unspecified fall, initial encounter: Secondary | ICD-10-CM | POA: Diagnosis not present

## 2016-06-23 DIAGNOSIS — I10 Essential (primary) hypertension: Secondary | ICD-10-CM | POA: Insufficient documentation

## 2016-06-23 DIAGNOSIS — Y939 Activity, unspecified: Secondary | ICD-10-CM | POA: Insufficient documentation

## 2016-06-23 DIAGNOSIS — Z79899 Other long term (current) drug therapy: Secondary | ICD-10-CM | POA: Diagnosis not present

## 2016-06-23 DIAGNOSIS — Z8673 Personal history of transient ischemic attack (TIA), and cerebral infarction without residual deficits: Secondary | ICD-10-CM | POA: Diagnosis not present

## 2016-06-23 DIAGNOSIS — Z87891 Personal history of nicotine dependence: Secondary | ICD-10-CM | POA: Diagnosis not present

## 2016-06-23 DIAGNOSIS — Y999 Unspecified external cause status: Secondary | ICD-10-CM | POA: Diagnosis not present

## 2016-06-23 DIAGNOSIS — R58 Hemorrhage, not elsewhere classified: Secondary | ICD-10-CM | POA: Diagnosis not present

## 2016-06-23 DIAGNOSIS — Z85819 Personal history of malignant neoplasm of unspecified site of lip, oral cavity, and pharynx: Secondary | ICD-10-CM | POA: Insufficient documentation

## 2016-06-23 DIAGNOSIS — E039 Hypothyroidism, unspecified: Secondary | ICD-10-CM | POA: Insufficient documentation

## 2016-06-23 DIAGNOSIS — Z7982 Long term (current) use of aspirin: Secondary | ICD-10-CM | POA: Diagnosis not present

## 2016-06-23 DIAGNOSIS — S51811A Laceration without foreign body of right forearm, initial encounter: Secondary | ICD-10-CM | POA: Insufficient documentation

## 2016-06-23 DIAGNOSIS — Y92009 Unspecified place in unspecified non-institutional (private) residence as the place of occurrence of the external cause: Secondary | ICD-10-CM | POA: Insufficient documentation

## 2016-06-23 DIAGNOSIS — Z8521 Personal history of malignant neoplasm of larynx: Secondary | ICD-10-CM | POA: Diagnosis not present

## 2016-06-23 NOTE — ED Triage Notes (Addendum)
Patient complaining of right arm laceration. Patient felled and she says she didn't hit her head. Patient states she only hit her arm. Patient takes an aspirin every day. Husband is at bedside. Patient has Dementia. Husband found her and did not see her fall.

## 2016-06-24 ENCOUNTER — Emergency Department (HOSPITAL_COMMUNITY)
Admission: EM | Admit: 2016-06-24 | Discharge: 2016-06-24 | Disposition: A | Discharge status: Home / Self Care | Payer: PPO | Attending: Emergency Medicine | Admitting: Emergency Medicine

## 2016-06-24 DIAGNOSIS — Y92009 Unspecified place in unspecified non-institutional (private) residence as the place of occurrence of the external cause: Secondary | ICD-10-CM

## 2016-06-24 DIAGNOSIS — S51811A Laceration without foreign body of right forearm, initial encounter: Secondary | ICD-10-CM

## 2016-06-24 DIAGNOSIS — R58 Hemorrhage, not elsewhere classified: Secondary | ICD-10-CM

## 2016-06-24 DIAGNOSIS — W19XXXA Unspecified fall, initial encounter: Secondary | ICD-10-CM

## 2016-06-24 MED ORDER — LIDOCAINE HCL 1 % IJ SOLN
INTRAMUSCULAR | Status: AC
  Filled 2016-06-24: qty 20

## 2016-06-24 MED ORDER — "THROMBI-PAD 3""X3"" EX PADS"
1.0000 | MEDICATED_PAD | Freq: Once | CUTANEOUS | Status: DC
  Filled 2016-06-24 (×2): qty 1

## 2016-06-24 NOTE — ED Notes (Signed)
Pt observed resting, husband at bedside. Offered to help suction pt, however husband sts he is ok doing it.

## 2016-06-24 NOTE — ED Notes (Signed)
Combat gauze applied to lac skin tear

## 2016-06-24 NOTE — ED Notes (Signed)
Pt's husband left to check on home, worried since the Christmas lights were left on. Call bell given to pt and instructed to call if she needs anything. Unit secretary advised pt has a trach and can't speak.

## 2016-06-24 NOTE — ED Notes (Signed)
Bleeding controlled.

## 2016-06-24 NOTE — ED Notes (Signed)
Dressing reinforced.

## 2016-06-24 NOTE — ED Notes (Signed)
Pt ambulated to bathroom 

## 2016-06-24 NOTE — ED Provider Notes (Signed)
[facility] DEPT Provider Note   CSN: [id number] Arrival date & time: 06/23/16  2344  By signing my name below, I, Jeanell Sparrow, attest that this documentation has been prepared under the direction and in the presence of Delora Fuel, MD . Electronically Signed: Jeanell Sparrow, Scribe. 06/24/2016. 1:25 AM.  History   Chief Complaint Chief Complaint  Patient presents with  . Extremity Laceration    left arm  . Fall   The history is provided by the spouse and the patient. No language interpreter was used.   HPI Comments: [name] is a 75 y.o. female who presents to the Emergency Department complaining of RUE wound that occurred today. Husband states he heard a loud sound in another room and saw that pt had fallen. He reports had a wound to her right forearm, but no head injury or LOC. He states she had a severe amount of bleeding before and after bandage wrapping. He reports she takes baby aspirin daily and her tetanus status is UTD. He states that pt is nonverbal due to past laryngectomy. He denies any other complaints.     PCP: Scarlette Calico, MD  Past Medical History:  Diagnosis Date  . 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 Anxiety   . Arthritis   . C. difficile colitis   . Cancer of larynx ([place])   . Cellulitis   . Cerebrovascular disease 2000  . Dementia   . Diverticulosis of colon (without mention of hemorrhage)   . Gait disorder   . H/O alcohol abuse   . History of UTI 10-2012  . Hyperlipidemia    takes Pravastatin daily  . Hypertension   . Hypothyroidism    takes Levothyroxine daily  . Joint pain   . Joint swelling   . Mild dysplasia of cervix   . 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 Peripheral vascular disease ([place])   . Permanent atrial fibrillation ([place])   . Personal history of colonic polyps    adenomatous 1997 & tubular adenoma and hyplastic  2008  . Pneumonia 2/16  . Redundant colon   . Seizures ([place])    takes Phenobarbital and Keppra  daily;last seizure was April 1,2014  . 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 Stroke ([place]) 2000   off balance on right side a little and peripheral vision loss on right side  . Subdural hematoma ([place]) 1990s   s/p fall in bathtub  . Ulcerative colitis    takes [place] daily  . Urinary incontinence   . UTI (lower urinary tract infection)     Patient Active Problem List   Diagnosis Date Noted  . Chronic bronchitis ([place]) 11/18/2015  . Osteoporosis of lower leg 04/13/2015  . Senile osteoporosis 04/13/2015  . 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 Protein-calorie malnutrition, severe ([place]) 06/09/2014  . Prediabetes 02/10/2014  . Routine health maintenance 02/05/2013  . Chronic atrial fibrillation ([place]) 01/09/2013  . Encounter for therapeutic drug monitoring 07/19/2012  . Hemiplegia, unspecified, affecting dominant side 03/19/2012  . Seizure ([place]) 03/18/2012  . Leukocytosis 03/18/2012  . Larynx cancer ([place]) 08/18/2011  . Anemia, iron deficiency 05/06/2011  . UC (ulcerative colitis) ([place]) 01/10/2011  . Hypothyroidism 06/23/2007  . Hyperlipidemia with target LDL less than 100 06/23/2007  . Essential hypertension 06/23/2007  . URINARY INCONTINENCE 06/23/2007    Past Surgical History:  Procedure Laterality Date  . BREAST LUMPECTOMY     left breast with radiation therapy  . CAROTID ENDARTERECTOMY  left  . CATARACT EXTRACTION, BILATERAL    . COLONOSCOPY    . FLEXIBLE SIGMOIDOSCOPY N/A 06/21/2013   Procedure: FLEXIBLE SIGMOIDOSCOPY;  Surgeon: Jerene Bears, MD;  Location: [facility];  Service: Gastroenterology;  Laterality: N/A;  . HAMMER TOE SURGERY Right 03/2013   pins and screws  . HARDWARE REMOVAL Right 11/14/2013   Procedure: RIGHT SHOUDER HARDWARE REMOVAL;  Surgeon: Nita Sells, MD;  Location: [facility];  Service: Orthopedics;  Laterality: Right;  . INCISION AND DRAINAGE Right 11/14/2013   Procedure: INCISION AND DRAINAGE;  Surgeon: Nita Sells, MD;  Location: [facility];  Service: Orthopedics;  Laterality: Right;  . LARYNX SURGERY  08/2010   [facility] x 2  . MASTECTOMY     Right, history with nodule dissection  . MULTIPLE TOOTH EXTRACTIONS     due to oral cancer  . Oral Surgery for squamous cell carcinoma of the mouth     x 3  . ORIF HIP FRACTURE  Sept '12   Right hip: screw and plate repair.  . ORIF HUMERUS FRACTURE Right 10/30/2012   Procedure: OPEN REDUCTION INTERNAL FIXATION (ORIF) HUMERAL SHAFT FRACTURE;  Surgeon: Hessie Dibble, MD;  Location: [facility];  Service: Orthopedics;  Laterality: Right;  BIG C-ARM, SHOULDER POSITION  . SHOULDER SURGERY  10/2013  . SUBDURAL HEMATOMA EVACUATION VIA CRANIOTOMY    . TUBAL LIGATION      OB History    No data available       Home Medications    Prior to Admission medications   Medication Sig Start Date End Date Taking? Authorizing Provider  acetylcysteine (MUCOMYST) 10 % nebulizer solution Take 1-2 mLs by nebulization 2 (two) times daily as needed (for thick secretions in trachea).    Historical Provider, MD  aspirin EC 81 MG tablet Take 81 mg by mouth daily with breakfast.    Historical Provider, MD  balsalazide (COLAZAL) 750 MG capsule Take 3 capsules (2,250 mg total) by mouth 3 (three) times daily. this can be opened and sprinkled on pudding for admin 05/21/16 06/20/16  Silver Huguenin Elgergawy, MD  Calcium Carbonate-Vitamin D (CALCIUM 500 + D PO) Take 1 tablet by mouth daily.    Historical Provider, MD  digoxin (LANOXIN) 0.125 MG tablet TAKE 1 TABLET (125 MCG TOTAL) BY MOUTH DAILY. Patient taking differently: TAKE 1 TABLET (125 MCG TOTAL) BY MOUTH every morning 04/01/16   Janith Lima, MD  diphenoxylate-atropine (LOMOTIL) 2.5-0.025 MG tablet TAKE 1 TABLET BY MOUTH 4 TIMES A DAY AS NEEDED FOR DIARRHEA OR LOOSE STOOLS Patient taking differently: TAKE 1 TABLET BY MOUTH twice per day 03/16/16   Jerene Bears, MD  levETIRAcetam (KEPPRA) 750 MG tablet Take 2 tablets (1,500 mg total) by  mouth 2 (two) times daily. 10/19/15   Kathrynn Ducking, MD  levothyroxine (SYNTHROID, LEVOTHROID) 50 MCG tablet TAKE 1 TABLET BY MOUTH ONCE EVERY EVENING 06/19/16   Janith Lima, MD  Multiple Vitamin (MULTIVITAMIN WITH MINERALS) TABS Take 1 tablet by mouth daily with breakfast.     Historical Provider, MD  Nutritional Supplements (PROMOD) LIQD Take 1 Bottle by mouth daily.    Historical Provider, MD  oxyCODONE (OXY IR/ROXICODONE) 5 MG immediate release tablet Take 5 mg by mouth 3 (three) times daily as needed for pain. 05/12/16   Historical Provider, MD  PHENObarbital (LUMINAL) 100 MG tablet TAKE 1 TABLET BY MOUTH EVERY DAY AT BEDTIME Patient taking differently: TAKE 1 TABLET BY MOUTH EVERY morning 12/28/15  Janith Lima, MD  pravastatin (PRAVACHOL) 40 MG tablet TAKE 1 TABLET (40 MG TOTAL) BY MOUTH EVERY EVENING. 06/19/16   Janith Lima, MD  predniSONE (DELTASONE) 5 MG tablet Replaced with 20 mg oral daily for 2 weeks , then 15 mg oral daily for 2 weeks , then 10 mg oral daily for 2 weeks , then continue 5 mg oral daily thereafter . 05/21/16   Albertine Lempi, MD    Family History Family History  Problem Relation Age of Onset  . Coronary artery disease Mother   . Heart disease Mother   . Diabetes Sister   . Breast cancer Maternal Aunt   . Breast cancer Sister   . Colon cancer      nephew (sister's son)    Social History Social History  Substance Use Topics  . Smoking status: Former Smoker    Years: 40.00    Quit date: 10/26/2004  . Smokeless tobacco: Never Used     Comment: quit in 2007  . Alcohol use 0.0 oz/week     Comment: normally 1 glass wine after dinner - occasional     Allergies   Xarelto [rivaroxaban] and Diltiazem   Review of Systems Review of Systems  Skin: Positive for wound (Right forearm).  Neurological: Negative for syncope.  All other systems reviewed and are negative.    Physical Exam Updated Vital Signs BP 151/69 (BP Location: Left Arm)    Pulse 107   Temp 98.4 F (36.9 C) (Axillary)   Resp 18   Ht 5\' 4"  (1.626 m)   Wt 110 lb (49.9 kg)   SpO2 96%   BMI 18.88 kg/m   Physical Exam  Constitutional: She appears well-developed and well-nourished.  HENT:  Head: Normocephalic and atraumatic.  Past surgical changes of the jaw. Neck tracheostomy in place.   Eyes: EOM are normal. Pupils are equal, round, and reactive to light.  Neck: Normal range of motion. Neck supple. No JVD present.  Cardiovascular: Normal rate, regular rhythm and normal heart sounds.   No murmur heard. Pulmonary/Chest: Effort normal and breath sounds normal. She has no wheezes. She has no rales. She exhibits no tenderness.  Abdominal: Soft. Bowel sounds are normal. She exhibits no distension and no mass. There is no tenderness.  Musculoskeletal: Normal range of motion. She exhibits no edema.  Large skin tear of the right forearm.   Lymphadenopathy:    She has no cervical adenopathy.  Neurological: She is alert. No cranial nerve deficit. She exhibits normal muscle tone. Coordination normal.  Nonverbal. No focal findings.   Skin: Skin is warm and dry. No rash noted.  Psychiatric: She has a normal mood and affect. Her behavior is normal. Judgment and thought content normal.  Nursing note and vitals reviewed.    ED Treatments / Results  DIAGNOSTIC STUDIES: Oxygen Saturation is 96% on RA, normal by my interpretation.    COORDINATION OF CARE: 1:30 AM- Pt advised of plan for treatment and pt agrees.  Procedures Procedures (including critical care time) Procedure: Ligation of bleeding sites Indication: Persistent bleeding from wound Prior to procedure, a timeout was performed Patient identified verbally and with armband Description: 5 different bleeding sites were identified. After anesthesia with 2% lidocaine infiltration, a figure-of-eight suture was placed around each bleeding site with good hemostasis obtained. There was minimal oozing from other  sites but no other definite bleeder. Combat gauzes applied and dressing applied. Patient, tolerated procedure well.  Medications Ordered in ED Medications  THROMBI-PAD (THROMBI-PAD) 3"X3" pad 1 each (1 each Topical Not Given 06/24/16 0225)  lidocaine (XYLOCAINE) 1 % (with pres) injection (not administered)     Initial Impression / Assessment and Plan / ED Course  I have reviewed the triage vital signs and the nursing notes.  Clinical Course    Fall with a skin tear of the right forearm. Skin tear is fairly extensive and cannot be approximated with Steri-Strips and tissue adhesive. Husband is mainly concerned about bleeding at home. That polyp causes applied and dressing applied. However, she continued to bleed and saturated the dressing. Dressing was removed and isolated bleeding sites were identified and managed with figure-of-eight suture with 4-0 chromic suture with reasonably good hemostasis achieved. Combat cause was reapplied and new dressing applied.She was observed for 45 minutes following this procedure with no significant bleeding and she is discharged home.  Final Clinical Impressions(s) / ED Diagnoses   Final diagnoses:  Fall at home, initial encounter  Skin tear of right forearm without complication, initial encounter  Bleeding    New Prescriptions New Prescriptions   No medications on file   I personally performed the services described in this documentation, which was scribed in my presence. The recorded information has been reviewed and is accurate.        Delora Fuel, MD [id number]

## 2016-06-24 NOTE — Discharge Instructions (Signed)
Leave the current dressing on until tomorrow morning. After that, change the dressing once a day. Return if there are any problems.

## 2016-06-24 NOTE — ED Notes (Signed)
Pt is still bleeding from lac site, bleeding is soaking through pressure dressing. Dr Roxanne Mins notified and he requested suture cart and 1 % lidocaine at bedside.

## 2016-06-29 ENCOUNTER — Encounter: Payer: Self-pay | Admitting: Nurse Practitioner

## 2016-06-29 ENCOUNTER — Ambulatory Visit (INDEPENDENT_AMBULATORY_CARE_PROVIDER_SITE_OTHER)
Admission: RE | Admit: 2016-06-29 | Discharge: 2016-06-29 | Disposition: A | Discharge status: Home / Self Care | Payer: PPO | Source: Ambulatory Visit | Attending: Nurse Practitioner | Admitting: Nurse Practitioner

## 2016-06-29 ENCOUNTER — Ambulatory Visit (INDEPENDENT_AMBULATORY_CARE_PROVIDER_SITE_OTHER): Payer: PPO | Admitting: Nurse Practitioner

## 2016-06-29 VITALS — BP 88/58 | HR 92 | Ht 64.0 in | Wt 111.0 lb

## 2016-06-29 DIAGNOSIS — S62114A Nondisplaced fracture of triquetrum [cuneiform] bone, right wrist, initial encounter for closed fracture: Secondary | ICD-10-CM | POA: Diagnosis not present

## 2016-06-29 DIAGNOSIS — W19XXXA Unspecified fall, initial encounter: Secondary | ICD-10-CM

## 2016-06-29 DIAGNOSIS — S59911A Unspecified injury of right forearm, initial encounter: Secondary | ICD-10-CM | POA: Diagnosis not present

## 2016-06-29 DIAGNOSIS — M7989 Other specified soft tissue disorders: Secondary | ICD-10-CM

## 2016-06-29 DIAGNOSIS — M79631 Pain in right forearm: Secondary | ICD-10-CM

## 2016-06-29 DIAGNOSIS — M25531 Pain in right wrist: Secondary | ICD-10-CM | POA: Diagnosis not present

## 2016-06-29 DIAGNOSIS — S6991XA Unspecified injury of right wrist, hand and finger(s), initial encounter: Secondary | ICD-10-CM | POA: Diagnosis not present

## 2016-06-29 DIAGNOSIS — S51811A Laceration without foreign body of right forearm, initial encounter: Secondary | ICD-10-CM

## 2016-06-29 NOTE — Progress Notes (Signed)
Pre visit review using our clinic review tool, if applicable. No additional management support is needed unless otherwise documented below in the visit note. 

## 2016-06-29 NOTE — Progress Notes (Signed)
Normal results, see office note

## 2016-06-29 NOTE — Progress Notes (Signed)
Subjective:  Patient ID: [name], female    DOB: 1941/06/17  Age: 75 y.o. MRN: [id number]  CC: Dressing Change (went to ER 6 days ago due to fall, bandage on right arm, had hard time change dressing. )  Ms. Picardi in office with husband.  Wound Check  She was originally treated 3 to 5 days ago. Previous treatment included wound cleansing or irrigation. Her temperature was unmeasured prior to arrival. There has been bloody discharge from the wound. There is no redness present. There is new swelling present. The pain has worsened (pain with dressing change). She has no difficulty moving the affected extremity or digit.  She presents for re eval of wound and dressing change. She fell at home 06/24/2016. Mr.Brazell is unsure how she fell. He heard a loud sound in dinning room, went to check of wife and found her on floor with right forearm skin tear. She was evaluated at [facility] Ed, Combat bandage was used to stop bleeding. No x-ray done. He states he has not been able to change dressing since ED visit and she has been complaining for forearm pain.  Outpatient Medications Prior to Visit  Medication Sig Dispense Refill  . acetylcysteine (MUCOMYST) 10 % nebulizer solution Take 1-2 mLs by nebulization 2 (two) times daily as needed (for thick secretions in trachea).    Marland Kitchen aspirin EC 81 MG tablet Take 81 mg by mouth daily with breakfast.    . Calcium Carbonate-Vitamin D (CALCIUM 500 + D PO) Take 1 tablet by mouth daily.    . digoxin (LANOXIN) 0.125 MG tablet TAKE 1 TABLET (125 MCG TOTAL) BY MOUTH DAILY. (Patient taking differently: TAKE 1 TABLET (125 MCG TOTAL) BY MOUTH every morning) 90 tablet 1  . diphenoxylate-atropine (LOMOTIL) 2.5-0.025 MG tablet TAKE 1 TABLET BY MOUTH 4 TIMES A DAY AS NEEDED FOR DIARRHEA OR LOOSE STOOLS (Patient taking differently: TAKE 1 TABLET BY MOUTH twice per day) 60 tablet 1  . levETIRAcetam (KEPPRA) 750 MG tablet Take 2 tablets (1,500 mg total) by mouth 2 (two) times  daily. 360 tablet 3  . levothyroxine (SYNTHROID, LEVOTHROID) 50 MCG tablet TAKE 1 TABLET BY MOUTH ONCE EVERY EVENING 90 tablet 1  . Multiple Vitamin (MULTIVITAMIN WITH MINERALS) TABS Take 1 tablet by mouth daily with breakfast.     . Nutritional Supplements (PROMOD) LIQD Take 1 Bottle by mouth daily.    Marland Kitchen oxyCODONE (OXY IR/ROXICODONE) 5 MG immediate release tablet Take 5 mg by mouth 3 (three) times daily as needed for pain.  0  . PHENObarbital (LUMINAL) 100 MG tablet TAKE 1 TABLET BY MOUTH EVERY DAY AT BEDTIME (Patient taking differently: TAKE 1 TABLET BY MOUTH EVERY morning) 90 tablet 1  . pravastatin (PRAVACHOL) 40 MG tablet TAKE 1 TABLET (40 MG TOTAL) BY MOUTH EVERY EVENING. 90 tablet 1  . predniSONE (DELTASONE) 5 MG tablet Replaced with 20 mg oral daily for 2 weeks , then 15 mg oral daily for 2 weeks , then 10 mg oral daily for 2 weeks , then continue 5 mg oral daily thereafter . 120 tablet 0  . balsalazide (COLAZAL) 750 MG capsule Take 3 capsules (2,250 mg total) by mouth 3 (three) times daily. this can be opened and sprinkled on pudding for admin 270 capsule 0   No facility-administered medications prior to visit.     ROS See HPI  Objective:  BP (!) 88/58   Pulse 92   Ht 5\' 4"  (1.626 m)   Wt 111 lb (  50.3 kg)   SpO2 96%   BMI 19.05 kg/m   BP Readings from Last 3 Encounters:  06/29/16 (!) 88/58  06/24/16 159/81  06/02/16 122/60    Wt Readings from Last 3 Encounters:  06/29/16 111 lb (50.3 kg)  06/23/16 110 lb (49.9 kg)  06/02/16 110 lb (49.9 kg)    Physical Exam  Constitutional: She is oriented to person, place, and time.  Neck: Normal range of motion. Neck supple.  Musculoskeletal: She exhibits edema and tenderness.       Right elbow: Normal.      Right wrist: She exhibits decreased range of motion, tenderness and swelling.       Right upper arm: Normal.       Right forearm: She exhibits tenderness, bony tenderness, swelling, edema and laceration. She exhibits no  deformity.       Arms:      Right hand: She exhibits tenderness, bony tenderness and swelling. She exhibits no laceration. Normal sensation noted.  Neurological: She is alert and oriented to person, place, and time.  Skin: No erythema.  Unable to completely remove Combat guaze and debride wound due to pain. I did trim around gauze and covered wound with non adherent guaze and kerlex bandage.  no active bleeding.  Vitals reviewed.   Lab Results  Component Value Date   WBC 12.7 (H) 05/21/2016   HGB 10.6 (L) 05/21/2016   HCT 31.7 (L) 05/21/2016   PLT 254 05/21/2016   GLUCOSE 194 (H) 05/21/2016   CHOL 176 02/22/2016   TRIG 60.0 02/22/2016   HDL 92.70 02/22/2016   LDLCALC 71 02/22/2016   ALT 17 05/21/2016   AST 21 05/21/2016   NA 136 05/21/2016   K 3.7 05/21/2016   CL 100 (L) 05/21/2016   CREATININE 0.48 05/21/2016   BUN 7 05/21/2016   CO2 29 05/21/2016   TSH 1.44 02/22/2016   INR 1.17 05/17/2016   HGBA1C 5.6 02/22/2016    No results found.  Assessment & Plan:   Kenysha was seen today for dressing change.  Diagnoses and all orders for this visit:  Fall, initial encounter -     DG Forearm Right; Future -     DG Wrist 2 Views Right; Future -     AMB referral to orthopedics  Forearm laceration, right, initial encounter  Pain and swelling of forearm, right -     DG Forearm Right; Future -     DG Wrist 2 Views Right; Future -     AMB referral to orthopedics  Closed nondisplaced fracture of triquetrum of right wrist, initial encounter -     AMB referral to orthopedics   I am having Ms. Cleek maintain her multivitamin with minerals, Calcium Carbonate-Vitamin D (CALCIUM 500 + D PO), levETIRAcetam, PHENObarbital, diphenoxylate-atropine, digoxin, oxyCODONE, acetylcysteine, aspirin EC, PROMOD, balsalazide, predniSONE, levothyroxine, and pravastatin.  No orders of the defined types were placed in this encounter.   Follow-up: Return in about 1 day (around 06/30/2016)  for wound care.  Wilfred Lacy, NP

## 2016-06-29 NOTE — Patient Instructions (Addendum)
Patient and husband advised to apply Vaseline ointment to wound and wrap with non adherent guaze Return to office tomorrow for another attempt to remove guaze. Take pain medication prior to tomorrow's office visit. Elevate right arm on pillow as much as possible to decrease swelling.  Go to basement for x-ray for forearm and wrist.

## 2016-06-30 ENCOUNTER — Ambulatory Visit (INDEPENDENT_AMBULATORY_CARE_PROVIDER_SITE_OTHER): Payer: PPO | Admitting: Nurse Practitioner

## 2016-06-30 ENCOUNTER — Telehealth: Payer: Self-pay | Admitting: Emergency Medicine

## 2016-06-30 DIAGNOSIS — S51811A Laceration without foreign body of right forearm, initial encounter: Secondary | ICD-10-CM

## 2016-06-30 MED ORDER — MUPIROCIN 2 % EX OINT: 1.0000 "application " | TOPICAL_OINTMENT | Freq: Every day | CUTANEOUS | 0 refills | Status: DC

## 2016-06-30 NOTE — Telephone Encounter (Signed)
Sent prescription of bactroban ointment to his pharmacy. He is to mix with vaseline ointment.

## 2016-06-30 NOTE — Patient Instructions (Addendum)
Change dressing ever 2days. Use non adherent gauze. Saturate guaze with clean water prior to removing. May use pain medication prior to dressing change.

## 2016-06-30 NOTE — Telephone Encounter (Signed)
Patients husband called and wanted to know what cream you wanted him to put on his wifes forearm? Thanks.

## 2016-06-30 NOTE — Progress Notes (Signed)
Subjective:  Patient ID: [name], female    DOB: 1941-06-10  Age: 75 y.o. 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 Patient and husband were advised by GI to refrain from oral abx use.  Outpatient Medications Prior to Visit  Medication Sig Dispense Refill  . acetylcysteine (MUCOMYST) 10 % nebulizer solution Take 1-2 mLs by nebulization 2 (two) times daily as needed (for thick secretions in trachea).    Marland Kitchen aspirin EC 81 MG tablet Take 81 mg by mouth daily with breakfast.    . balsalazide (COLAZAL) 750 MG capsule Take 3 capsules (2,250 mg total) by mouth 3 (three) times daily. this can be opened and sprinkled on pudding for admin 270 capsule 0  . Calcium Carbonate-Vitamin D (CALCIUM 500 + D PO) Take 1 tablet by mouth daily.    . digoxin (LANOXIN) 0.125 MG tablet TAKE 1 TABLET (125 MCG TOTAL) BY MOUTH DAILY. (Patient taking differently: TAKE 1 TABLET (125 MCG TOTAL) BY MOUTH every morning) 90 tablet 1  . diphenoxylate-atropine (LOMOTIL) 2.5-0.025 MG tablet TAKE 1 TABLET BY MOUTH 4 TIMES A DAY AS NEEDED FOR DIARRHEA OR LOOSE STOOLS (Patient taking differently: TAKE 1 TABLET BY MOUTH twice per day) 60 tablet 1  . levETIRAcetam (KEPPRA) 750 MG tablet Take 2 tablets (1,500 mg total) by mouth 2 (two) times daily. 360 tablet 3  . levothyroxine (SYNTHROID, LEVOTHROID) 50 MCG tablet TAKE 1 TABLET BY MOUTH ONCE EVERY EVENING 90 tablet 1  . Multiple Vitamin (MULTIVITAMIN WITH MINERALS) TABS Take 1 tablet by mouth daily with breakfast.     . Nutritional Supplements (PROMOD) LIQD Take 1 Bottle by mouth daily.    Marland Kitchen oxyCODONE (OXY IR/ROXICODONE) 5 MG immediate release tablet Take 5 mg by mouth 3 (three) times daily as needed for pain.  0  . PHENObarbital (LUMINAL) 100 MG tablet TAKE 1 TABLET BY MOUTH EVERY DAY AT BEDTIME (Patient taking differently: TAKE 1 TABLET BY MOUTH EVERY morning) 90 tablet 1  . pravastatin (PRAVACHOL) 40 MG tablet TAKE 1  TABLET (40 MG TOTAL) BY MOUTH EVERY EVENING. 90 tablet 1  . predniSONE (DELTASONE) 5 MG tablet Replaced with 20 mg oral daily for 2 weeks , then 15 mg oral daily for 2 weeks , then 10 mg oral daily for 2 weeks , then continue 5 mg oral daily thereafter . 120 tablet 0   No facility-administered medications prior to visit.     ROS See HPI  Objective:  There were no vitals taken for this visit.  BP Readings from Last 3 Encounters:  06/29/16 (!) 88/58  06/24/16 159/81  06/02/16 122/60    Wt Readings from Last 3 Encounters:  06/29/16 111 lb (50.3 kg)  06/23/16 110 lb (49.9 kg)  06/02/16 110 lb (49.9 kg)    Physical Exam  Cardiovascular: Normal rate.   Pulmonary/Chest: Effort normal.  Musculoskeletal:       Arms: Neurological: She is alert.  Skin: There is erythema.    Lab Results  Component Value Date   WBC 12.7 (H) 05/21/2016   HGB 10.6 (L) 05/21/2016   HCT 31.7 (L) 05/21/2016   PLT 254 05/21/2016   GLUCOSE 194 (H) 05/21/2016   CHOL 176 02/22/2016   TRIG 60.0 02/22/2016   HDL 92.70 02/22/2016   LDLCALC 71 02/22/2016   ALT 17 05/21/2016   AST 21 05/21/2016   NA 136 05/21/2016   K 3.7 05/21/2016  CL 100 (L) 05/21/2016   CREATININE 0.48 05/21/2016   BUN 7 05/21/2016   CO2 29 05/21/2016   TSH 1.44 02/22/2016   INR 1.17 05/17/2016   HGBA1C 5.6 02/22/2016    Dg Forearm Right  Result Date: 06/29/2016 CLINICAL DATA:  Fall, forearm and wrist pain. EXAM: RIGHT FOREARM - 2 VIEW COMPARISON:  None. FINDINGS: Probable posterior forearm soft tissue laceration with overlying bandage. No underlying bony abnormality. No fracture, subluxation or dislocation. IMPRESSION: No acute bony abnormality. Electronically Signed   By: Rolm Baptise M.D.   On: 06/29/2016 12:48   Dg Wrist 2 Views Right  Result Date: 06/29/2016 CLINICAL DATA:  Fall, forearm and wrist pain. EXAM: RIGHT WRIST - 2 VIEW COMPARISON:  None. FINDINGS: Mild degenerative changes in the right wrist. Irregularity is  noted posteriorly on the lateral view with overlying soft tissue swelling. This may reflect a triquetrum avulsion fracture. No subluxation or dislocation. IMPRESSION: Question triquetrum avulsion fracture posteriorly. Electronically Signed   By: Rolm Baptise M.D.   On: 06/29/2016 12:49    Assessment & Plan:   [name] was seen today for dressing change.  Diagnoses and all orders for this visit:  Forearm laceration, right, initial encounter -     Ambulatory referral to [place] -     mupirocin ointment (BACTROBAN) 2 %; Place 1 application into the nose daily.   I am having [name] start on mupirocin ointment. I am also having her maintain her multivitamin with minerals, Calcium Carbonate-Vitamin D (CALCIUM 500 + D PO), levETIRAcetam, PHENObarbital, diphenoxylate-atropine, digoxin, oxyCODONE, acetylcysteine, aspirin EC, PROMOD, balsalazide, predniSONE, levothyroxine, and pravastatin.  Meds ordered this encounter  Medications  . mupirocin ointment (BACTROBAN) 2 %    Sig: Place 1 application into the nose daily.    Dispense:  22 g    Refill:  0    Order Specific Question:   Supervising Provider    Answer:   Cassandria Anger [[id number]]    Follow-up: Return if symptoms worsen or fail to improve.  Wilfred Lacy, NP

## 2016-07-01 ENCOUNTER — Ambulatory Visit (INDEPENDENT_AMBULATORY_CARE_PROVIDER_SITE_OTHER): Payer: PPO | Admitting: Orthopaedic Surgery

## 2016-07-01 ENCOUNTER — Encounter: Payer: Self-pay | Admitting: Nurse Practitioner

## 2016-07-01 DIAGNOSIS — S63501A Unspecified sprain of right wrist, initial encounter: Secondary | ICD-10-CM | POA: Diagnosis not present

## 2016-07-01 NOTE — Telephone Encounter (Signed)
Script sent to incorrect pharmacy.  Patient needs script sent to [facility] on [address] at [facility].   Are there samples patient can pick up?   Also, is bacitracin related? States bandage is to be changed this afternoon.  Please follow up in regard.

## 2016-07-03 ENCOUNTER — Other Ambulatory Visit: Payer: Self-pay | Admitting: Internal Medicine

## 2016-07-05 NOTE — Telephone Encounter (Signed)
Tried to call patient, just rings And rings---no answer, no way to leave message---i'm not sure what patient needs---if she calls back, can talk with tamara

## 2016-07-06 DIAGNOSIS — C049 Malignant neoplasm of floor of mouth, unspecified: Secondary | ICD-10-CM | POA: Diagnosis not present

## 2016-07-06 DIAGNOSIS — R490 Dysphonia: Secondary | ICD-10-CM | POA: Diagnosis not present

## 2016-07-07 ENCOUNTER — Encounter: Payer: Self-pay | Admitting: Internal Medicine

## 2016-07-07 ENCOUNTER — Ambulatory Visit (INDEPENDENT_AMBULATORY_CARE_PROVIDER_SITE_OTHER): Payer: PPO | Admitting: Internal Medicine

## 2016-07-07 VITALS — BP 150/88 | HR 87 | Temp 97.7°F | Resp 16 | Ht 64.0 in | Wt 110.4 lb

## 2016-07-07 DIAGNOSIS — K5100 Ulcerative (chronic) pancolitis without complications: Secondary | ICD-10-CM | POA: Diagnosis not present

## 2016-07-07 DIAGNOSIS — Z5189 Encounter for other specified aftercare: Secondary | ICD-10-CM | POA: Diagnosis not present

## 2016-07-07 DIAGNOSIS — C329 Malignant neoplasm of larynx, unspecified: Secondary | ICD-10-CM

## 2016-07-07 DIAGNOSIS — I482 Chronic atrial fibrillation, unspecified: Secondary | ICD-10-CM

## 2016-07-07 MED ORDER — BALSALAZIDE DISODIUM 750 MG PO CAPS: 2250.0000 mg | ORAL_CAPSULE | Freq: Three times a day (TID) | ORAL | 3 refills | Status: DC

## 2016-07-07 NOTE — Patient Instructions (Signed)

## 2016-07-07 NOTE — Progress Notes (Signed)
Subjective:  Patient ID: [name], female    DOB: [birth year]  Age: 76 y.o. MRN: [id number]  CC: Wound Check   HPI [name] presents for a wound check over her right forearm. She fell about 2 weeks ago and fractured her right wrist which is being cared for by orthopedics but she also sustained a skin tear on the dorsomedial surface of the right forearm that was treated with a thrombi-pad. The area is healing nicely and she has no concerns about it. There has been no exudate, erythema, pain, or swelling. 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 She has started amoxicillin and is tolerating it well.  Outpatient Medications Prior to Visit  Medication Sig Dispense Refill  . acetylcysteine (MUCOMYST) 10 % nebulizer solution Take 1-2 mLs by nebulization 2 (two) times daily as needed (for thick secretions in trachea).    Marland Kitchen aspirin EC 81 MG tablet Take 81 mg by mouth daily with breakfast.    . Calcium Carbonate-Vitamin D (CALCIUM 500 + D PO) Take 1 tablet by mouth daily.    . digoxin (LANOXIN) 0.125 MG tablet TAKE 1 TABLET (125 MCG TOTAL) BY MOUTH DAILY. (Patient taking differently: TAKE 1 TABLET (125 MCG TOTAL) BY MOUTH every morning) 90 tablet 1  . diphenoxylate-atropine (LOMOTIL) 2.5-0.025 MG tablet TAKE 1 TABLET 4 TIMES A DAY AS NEEDED FOR DIARRHEA 60 tablet 1  . levETIRAcetam (KEPPRA) 750 MG tablet Take 2 tablets (1,500 mg total) by mouth 2 (two) times daily. 360 tablet 3  . levothyroxine (SYNTHROID, LEVOTHROID) 50 MCG tablet TAKE 1 TABLET BY MOUTH ONCE EVERY EVENING 90 tablet 1  . mupirocin ointment (BACTROBAN) 2 % Place 1 application into the nose daily. 22 g 0  . Nutritional Supplements (PROMOD) LIQD Take 1 Bottle by mouth daily.    Marland Kitchen oxyCODONE (OXY IR/ROXICODONE) 5 MG immediate release tablet Take 5  mg by mouth 3 (three) times daily as needed for pain.  0  . PHENObarbital (LUMINAL) 100 MG tablet TAKE 1 TABLET BY MOUTH EVERY DAY AT BEDTIME (Patient taking differently: TAKE 1 TABLET BY MOUTH EVERY morning) 90 tablet 1  . pravastatin (PRAVACHOL) 40 MG tablet TAKE 1 TABLET (40 MG TOTAL) BY MOUTH EVERY EVENING. 90 tablet 1  . predniSONE (DELTASONE) 5 MG tablet Replaced with 20 mg oral daily for 2 weeks , then 15 mg oral daily for 2 weeks , then 10 mg oral daily for 2 weeks , then continue 5 mg oral daily thereafter . 120 tablet 0  . balsalazide (COLAZAL) 750 MG capsule Take 3 capsules (2,250 mg total) by mouth 3 (three) times daily. this can be opened and sprinkled on pudding for admin 270 capsule 0  . Multiple Vitamin (MULTIVITAMIN WITH MINERALS) TABS Take 1 tablet by mouth daily with breakfast.      No facility-administered medications prior to visit.     ROS Review of Systems  Constitutional: Negative for activity change, chills and fatigue.  HENT: Positive for drooling, facial swelling and trouble swallowing. Negative for sore throat.   Eyes: Negative.   Respiratory: Positive for cough. Negative for chest tightness, shortness of breath and wheezing.   Cardiovascular: Negative.  Negative for chest pain, palpitations and leg swelling.  Gastrointestinal: Negative for abdominal pain, constipation, diarrhea, nausea and vomiting.  Endocrine: Negative.  Genitourinary: Negative.   Musculoskeletal: Positive for arthralgias and gait problem. Negative for back pain, myalgias and neck pain.  Skin: Positive for wound. Negative for color change and pallor.  Allergic/Immunologic: Negative.   Hematological: Negative.  Negative for adenopathy. Does not bruise/bleed easily.  Psychiatric/Behavioral: Negative.     Objective:  BP (!) 150/88 (BP Location: Left Arm, Patient Position: Sitting, Cuff Size: Normal)   Pulse 87   Temp 97.7 F (36.5 C) (Oral)   Resp 16   Ht 5\' 4"  (1.626 m)   Wt 110 lb 6 oz  (50.1 kg)   SpO2 96%   BMI 18.95 kg/m   BP Readings from Last 3 Encounters:  07/07/16 (!) 150/88  06/29/16 (!) 88/58  06/24/16 159/81    Wt Readings from Last 3 Encounters:  07/07/16 110 lb 6 oz (50.1 kg)  06/29/16 111 lb (50.3 kg)  06/23/16 110 lb (49.9 kg)    Physical Exam  Constitutional: She is oriented to person, place, and time. No distress.  HENT:  Head:    Mouth/Throat: Oropharynx is clear and moist. No oropharyngeal exudate.  Eyes: Conjunctivae are normal. Right eye exhibits no discharge. Left eye exhibits no discharge. No scleral icterus.  Neck: Normal range of motion. Neck supple. No JVD present. No tracheal deviation present. No thyromegaly present.  Cardiovascular: Normal rate and intact distal pulses.  An irregularly irregular rhythm present. Exam reveals no gallop and no friction rub.   No murmur heard. Pulmonary/Chest: Effort normal and breath sounds normal. No stridor. No respiratory distress. She has no wheezes. She has no rales. She exhibits no tenderness.  Abdominal: Soft. Bowel sounds are normal. She exhibits no distension and no mass. There is no tenderness. There is no rebound and no guarding.  Musculoskeletal: Normal range of motion. She exhibits no edema, tenderness or deformity.       Right forearm: She exhibits swelling. She exhibits no tenderness, no bony tenderness and no laceration.       Arms: Lymphadenopathy:    She has no cervical adenopathy.  Neurological: She is oriented to person, place, and time.  Skin: Skin is warm and dry. No rash noted. She is not diaphoretic. No erythema. No pallor.  Vitals reviewed.   Lab Results  Component Value Date   WBC 12.7 (H) 05/21/2016   HGB 10.6 (L) 05/21/2016   HCT 31.7 (L) 05/21/2016   PLT 254 05/21/2016   GLUCOSE 194 (H) 05/21/2016   CHOL 176 02/22/2016   TRIG 60.0 02/22/2016   HDL 92.70 02/22/2016   LDLCALC 71 02/22/2016   ALT 17 05/21/2016   AST 21 05/21/2016   NA 136 05/21/2016   K 3.7  05/21/2016   CL 100 (L) 05/21/2016   CREATININE 0.48 05/21/2016   BUN 7 05/21/2016   CO2 29 05/21/2016   TSH 1.44 02/22/2016   INR 1.17 05/17/2016   HGBA1C 5.6 02/22/2016    Dg Forearm Right  Result Date: 06/29/2016 CLINICAL DATA:  Fall, forearm and wrist pain. EXAM: RIGHT FOREARM - 2 VIEW COMPARISON:  None. FINDINGS: Probable posterior forearm soft tissue laceration with overlying bandage. No underlying bony abnormality. No fracture, subluxation or dislocation. IMPRESSION: No acute bony abnormality. Electronically Signed   By: Rolm Baptise M.D.   On: 06/29/2016 12:48   Dg Wrist 2 Views Right  Result Date: 06/29/2016 CLINICAL DATA:  Fall, forearm and wrist pain. EXAM: RIGHT WRIST - 2 VIEW COMPARISON:  None. FINDINGS: Mild degenerative changes in the right wrist. Irregularity is  noted posteriorly on the lateral view with overlying soft tissue swelling. This may reflect a triquetrum avulsion fracture. No subluxation or dislocation. IMPRESSION: Question triquetrum avulsion fracture posteriorly. Electronically Signed   By: Rolm Baptise M.D.   On: 06/29/2016 12:49    Assessment & Plan:   Oluwasemilore was seen today for wound check.  Diagnoses and all orders for this visit:  Ulcerative pancolitis without complication ([place]) -     balsalazide (COLAZAL) 750 MG capsule; Take 3 capsules (2,250 mg total) by mouth 3 (three) times daily. this can be opened and sprinkled on pudding for admin  Encounter for post-traumatic wound check- the wound is healing nicely with no evidence of complications or infection. I asked her to leave the thrombi-pad in place until it naturally falls off.  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  Chronic atrial fibrillation ([facility])- good rate control, anticoagulation is contraindicated due to her bowel disease and recent falls   I have discontinued Ms. Emmons's multivitamin with minerals. I  am also having her maintain her Calcium Carbonate-Vitamin D (CALCIUM 500 + D PO), levETIRAcetam, PHENObarbital, digoxin, oxyCODONE, acetylcysteine, aspirin EC, PROMOD, predniSONE, levothyroxine, pravastatin, mupirocin ointment, diphenoxylate-atropine, and balsalazide.  Meds ordered this encounter  Medications  . balsalazide (COLAZAL) 750 MG capsule    Sig: Take 3 capsules (2,250 mg total) by mouth 3 (three) times daily. this can be opened and sprinkled on pudding for admin    Dispense:  21 capsule    Refill:  3     Follow-up: Return if symptoms worsen or fail to improve.  Scarlette Calico, MD

## 2016-07-07 NOTE — Progress Notes (Signed)
Pre visit review using our clinic review tool, if applicable. No additional management support is needed unless otherwise documented below in the visit note. 150/90

## 2016-07-11 DIAGNOSIS — R1310 Dysphagia, unspecified: Secondary | ICD-10-CM | POA: Diagnosis not present

## 2016-07-11 DIAGNOSIS — C049 Malignant neoplasm of floor of mouth, unspecified: Secondary | ICD-10-CM | POA: Diagnosis not present

## 2016-07-11 DIAGNOSIS — R1312 Dysphagia, oropharyngeal phase: Secondary | ICD-10-CM | POA: Diagnosis not present

## 2016-07-13 ENCOUNTER — Telehealth: Payer: Self-pay | Admitting: Internal Medicine

## 2016-07-13 ENCOUNTER — Other Ambulatory Visit: Payer: Self-pay

## 2016-07-13 MED ORDER — SULFASALAZINE 500 MG PO TABS: 500.0000 mg | ORAL_TABLET | Freq: Three times a day (TID) | ORAL | 3 refills | Status: DC

## 2016-07-13 NOTE — Telephone Encounter (Signed)
Yes, can try to substitute sulfasalazine 500 mg TID to start May need to dose increase based on response

## 2016-07-13 NOTE — Telephone Encounter (Signed)
Script called into [facility] outpt pharmacy. Pts husband aware.

## 2016-07-13 NOTE — Telephone Encounter (Signed)
Script sent to [facility] per pt request.

## 2016-07-13 NOTE — Telephone Encounter (Signed)
Husband states pt is taking balsalazide and it is messy and tends to get all over the place. Mr. [name] says he has been researching and wants to know if sulfasalazine immediate release would be an option for her. States this can be crushed. Please advise.

## 2016-07-22 ENCOUNTER — Other Ambulatory Visit: Payer: Self-pay | Admitting: Internal Medicine

## 2016-07-22 NOTE — Telephone Encounter (Signed)
rx faxed to [facility] 

## 2016-07-27 DIAGNOSIS — L0201 Cutaneous abscess of face: Secondary | ICD-10-CM | POA: Diagnosis not present

## 2016-07-27 DIAGNOSIS — C049 Malignant neoplasm of floor of mouth, unspecified: Secondary | ICD-10-CM | POA: Diagnosis not present

## 2016-07-27 DIAGNOSIS — L0291 Cutaneous abscess, unspecified: Secondary | ICD-10-CM | POA: Diagnosis not present

## 2016-08-03 DIAGNOSIS — M79671 Pain in right foot: Secondary | ICD-10-CM | POA: Diagnosis not present

## 2016-08-15 ENCOUNTER — Ambulatory Visit: Payer: PPO | Admitting: Internal Medicine

## 2016-08-15 ENCOUNTER — Ambulatory Visit (INDEPENDENT_AMBULATORY_CARE_PROVIDER_SITE_OTHER): Payer: PPO | Admitting: Internal Medicine

## 2016-08-15 ENCOUNTER — Ambulatory Visit (INDEPENDENT_AMBULATORY_CARE_PROVIDER_SITE_OTHER)
Admission: RE | Admit: 2016-08-15 | Discharge: 2016-08-15 | Disposition: A | Discharge status: Home / Self Care | Payer: PPO | Source: Ambulatory Visit | Attending: Internal Medicine | Admitting: Internal Medicine

## 2016-08-15 VITALS — BP 136/78 | HR 74 | Temp 97.9°F | Resp 20 | Wt 111.5 lb

## 2016-08-15 DIAGNOSIS — R05 Cough: Secondary | ICD-10-CM | POA: Diagnosis not present

## 2016-08-15 DIAGNOSIS — S22089A Unspecified fracture of T11-T12 vertebra, initial encounter for closed fracture: Secondary | ICD-10-CM | POA: Diagnosis not present

## 2016-08-15 DIAGNOSIS — S22080A Wedge compression fracture of T11-T12 vertebra, initial encounter for closed fracture: Secondary | ICD-10-CM | POA: Diagnosis not present

## 2016-08-15 DIAGNOSIS — R059 Cough, unspecified: Secondary | ICD-10-CM

## 2016-08-15 NOTE — Progress Notes (Signed)
Pre visit review using our clinic review tool, if applicable. No additional management support is needed unless otherwise documented below in the visit note. 

## 2016-08-15 NOTE — Patient Instructions (Signed)
Cough, Adult Coughing is a reflex that clears your throat and your airways. Coughing helps to heal and protect your lungs. It is normal to cough occasionally, but a cough that happens with other symptoms or lasts a long time may be a sign of a condition that needs treatment. A cough may last only 2-3 weeks (acute), or it may last longer than 8 weeks (chronic). What are the causes? Coughing is commonly caused by:  Breathing in substances that irritate your lungs.  A viral or bacterial respiratory infection.  Allergies.  Asthma.  Postnasal drip.  Smoking.  Acid backing up from the stomach into the esophagus (gastroesophageal reflux).  Certain medicines.  Chronic lung problems, including COPD (or rarely, lung cancer).  Other medical conditions such as heart failure.  Follow these instructions at home: Pay attention to any changes in your symptoms. Take these actions to help with your discomfort:  Take medicines only as told by your health care provider. ? If you were prescribed an antibiotic medicine, take it as told by your health care provider. Do not stop taking the antibiotic even if you start to feel better. ? Talk with your health care provider before you take a cough suppressant medicine.  Drink enough fluid to keep your urine clear or pale yellow.  If the air is dry, use a cold steam vaporizer or humidifier in your bedroom or your home to help loosen secretions.  Avoid anything that causes you to cough at work or at home.  If your cough is worse at night, try sleeping in a semi-upright position.  Avoid cigarette smoke. If you smoke, quit smoking. If you need help quitting, ask your health care provider.  Avoid caffeine.  Avoid alcohol.  Rest as needed.  Contact a health care provider if:  You have new symptoms.  You cough up pus.  Your cough does not get better after 2-3 weeks, or your cough gets worse.  You cannot control your cough with suppressant  medicines and you are losing sleep.  You develop pain that is getting worse or pain that is not controlled with pain medicines.  You have a fever.  You have unexplained weight loss.  You have night sweats. Get help right away if:  You cough up blood.  You have difficulty breathing.  Your heartbeat is very fast. This information is not intended to replace advice given to you by your health care provider. Make sure you discuss any questions you have with your health care provider. Document Released: 12/17/2010 Document Revised: 11/26/2015 Document Reviewed: 08/27/2014 Elsevier Interactive Patient Education  2017 Elsevier Inc.  

## 2016-08-15 NOTE — Progress Notes (Signed)
Subjective:  Patient ID: [name], female    DOB: Jul 04, 1941  Age: 76 y.o. MRN: [id number]  CC: Cough   HPI [name] presents for concerns about a cough and left lower rib cage/back pain one week after a fall. She has a nonproductive cough but there has been some wheezing and shortness of breath. She has been seen by an ENT at [facility] for an abscess on her chin which is improving with oral antibiotics.  Outpatient Medications Prior to Visit  Medication Sig Dispense Refill  . acetylcysteine (MUCOMYST) 10 % nebulizer solution Take 1-2 mLs by nebulization 2 (two) times daily as needed (for thick secretions in trachea).    Marland Kitchen aspirin EC 81 MG tablet Take 81 mg by mouth daily with breakfast.    . Calcium Carbonate-Vitamin D (CALCIUM 500 + D PO) Take 1 tablet by mouth daily.    . digoxin (LANOXIN) 0.125 MG tablet TAKE 1 TABLET (125 MCG TOTAL) BY MOUTH DAILY. (Patient taking differently: TAKE 1 TABLET (125 MCG TOTAL) BY MOUTH every morning) 90 tablet 1  . diphenoxylate-atropine (LOMOTIL) 2.5-0.025 MG tablet TAKE 1 TABLET 4 TIMES A DAY AS NEEDED FOR DIARRHEA 60 tablet 1  . levETIRAcetam (KEPPRA) 750 MG tablet Take 2 tablets (1,500 mg total) by mouth 2 (two) times daily. 360 tablet 3  . levothyroxine (SYNTHROID, LEVOTHROID) 50 MCG tablet TAKE 1 TABLET BY MOUTH ONCE EVERY EVENING 90 tablet 1  . mupirocin ointment (BACTROBAN) 2 % Place 1 application into the nose daily. 22 g 0  . Nutritional Supplements (PROMOD) LIQD Take 1 Bottle by mouth daily.    Marland Kitchen oxyCODONE (OXY IR/ROXICODONE) 5 MG immediate release tablet Take 5 mg by mouth 3 (three) times daily as needed for pain.  0  . PHENObarbital (LUMINAL) 100 MG tablet TAKE 1 TABLET BY MOUTH EVERY DAY AT BEDTIME 90 tablet 1  . pravastatin (PRAVACHOL) 40 MG tablet TAKE 1 TABLET (40 MG TOTAL) BY MOUTH EVERY EVENING. 90 tablet 1  . predniSONE (DELTASONE) 5 MG tablet Replaced with 20 mg oral daily for 2 weeks , then 15 mg oral daily  for 2 weeks , then 10 mg oral daily for 2 weeks , then continue 5 mg oral daily thereafter . 120 tablet 0  . sulfaSALAzine (AZULFIDINE) 500 MG tablet Take 1 tablet (500 mg total) by mouth 3 (three) times daily. Immediate release needed so can be crushed 90 tablet 3  . balsalazide (COLAZAL) 750 MG capsule Take 3 capsules (2,250 mg total) by mouth 3 (three) times daily. this can be opened and sprinkled on pudding for admin 21 capsule 3   No facility-administered medications prior to visit.     ROS Review of Systems  Constitutional: Positive for fatigue. Negative for appetite change, chills, fever and unexpected weight change.  HENT: Positive for trouble swallowing.   Eyes: Negative for visual disturbance.  Respiratory: Positive for cough, shortness of breath and wheezing. Negative for chest tightness and stridor.   Cardiovascular: Positive for chest pain. Negative for palpitations and leg swelling.  Gastrointestinal: Negative for abdominal pain, constipation, diarrhea, nausea and vomiting.  Endocrine: Negative.   Genitourinary: Negative.   Musculoskeletal: Positive for back pain. Negative for arthralgias, joint swelling, myalgias and neck pain.  Skin: Negative.  Negative for color change and rash.  Allergic/Immunologic: Negative.   Neurological: Negative.  Negative for dizziness, syncope, weakness and light-headedness.  Hematological: Negative.  Negative for adenopathy. Does not bruise/bleed easily.  Psychiatric/Behavioral: Negative.  Objective:  BP 136/78   Pulse 74   Resp 20   Wt 111 lb 8 oz (50.6 kg)   SpO2 96%   BMI 19.14 kg/m   BP Readings from Last 3 Encounters:  08/15/16 136/78  07/07/16 (!) 150/88  06/29/16 (!) 88/58    Wt Readings from Last 3 Encounters:  08/15/16 111 lb 8 oz (50.6 kg)  07/07/16 110 lb 6 oz (50.1 kg)  06/29/16 111 lb (50.3 kg)    Physical Exam  Constitutional: She is oriented to person, place, and time.  Non-toxic appearance. She has a sickly  appearance. She appears ill. No distress.  HENT:  Mouth/Throat: Oropharynx is clear and moist. No oropharyngeal exudate.  Eyes: Conjunctivae are normal. Right eye exhibits no discharge. Left eye exhibits no discharge. No scleral icterus.  Neck: Normal range of motion. Neck supple. No JVD present. No tracheal deviation present. No thyromegaly present.  Cardiovascular: Normal rate, normal heart sounds and intact distal pulses.  Exam reveals no gallop and no friction rub.   No murmur heard. Pulmonary/Chest: Effort normal. No accessory muscle usage or stridor. No tachypnea. No respiratory distress. She has no decreased breath sounds. She has wheezes in the right middle field and the left middle field. She has rhonchi in the right middle field and the left middle field. She has no rales. She exhibits no mass, no tenderness, no bony tenderness, no crepitus, no edema and no deformity.  Abdominal: Soft. Bowel sounds are normal. She exhibits no distension and no mass. There is no tenderness. There is no rebound and no guarding.  Musculoskeletal: Normal range of motion. She exhibits no edema, tenderness or deformity.  Lymphadenopathy:    She has no cervical adenopathy.  Neurological: She is oriented to person, place, and time.  Skin: Skin is warm and dry. No rash noted. She is not diaphoretic. No erythema. No pallor.  Vitals reviewed.   Lab Results  Component Value Date   WBC 12.7 (H) 05/21/2016   HGB 10.6 (L) 05/21/2016   HCT 31.7 (L) 05/21/2016   PLT 254 05/21/2016   GLUCOSE 194 (H) 05/21/2016   CHOL 176 02/22/2016   TRIG 60.0 02/22/2016   HDL 92.70 02/22/2016   LDLCALC 71 02/22/2016   ALT 17 05/21/2016   AST 21 05/21/2016   NA 136 05/21/2016   K 3.7 05/21/2016   CL 100 (L) 05/21/2016   CREATININE 0.48 05/21/2016   BUN 7 05/21/2016   CO2 29 05/21/2016   TSH 1.44 02/22/2016   INR 1.17 05/17/2016   HGBA1C 5.6 02/22/2016    Dg Forearm Right  Result Date: 06/29/2016 CLINICAL DATA:   Fall, forearm and wrist pain. EXAM: RIGHT FOREARM - 2 VIEW COMPARISON:  None. FINDINGS: Probable posterior forearm soft tissue laceration with overlying bandage. No underlying bony abnormality. No fracture, subluxation or dislocation. IMPRESSION: No acute bony abnormality. Electronically Signed   By: Rolm Baptise M.D.   On: 06/29/2016 12:48   Dg Wrist 2 Views Right  Result Date: 06/29/2016 CLINICAL DATA:  Fall, forearm and wrist pain. EXAM: RIGHT WRIST - 2 VIEW COMPARISON:  None. FINDINGS: Mild degenerative changes in the right wrist. Irregularity is noted posteriorly on the lateral view with overlying soft tissue swelling. This may reflect a triquetrum avulsion fracture. No subluxation or dislocation. IMPRESSION: Question triquetrum avulsion fracture posteriorly. Electronically Signed   By: Rolm Baptise M.D.   On: 06/29/2016 12:49    Assessment & Plan:   Kandiss was seen today for cough.  Diagnoses and all orders for this visit:  Cough- her chest x-ray is negative for pneumonia, the patient and her husband do not want to treat this any further. -     DG Chest 2 View; Future  Closed wedge compression fracture of twelfth thoracic vertebra, initial encounter [facility])- she has a new T12 fracture on plain films. Will continue oxycodone as needed for pain relief.   I am having Ms. Duhe maintain her Calcium Carbonate-Vitamin D (CALCIUM 500 + D PO), levETIRAcetam, digoxin, oxyCODONE, acetylcysteine, aspirin EC, PROMOD, predniSONE, levothyroxine, pravastatin, mupirocin ointment, diphenoxylate-atropine, balsalazide, sulfaSALAzine, and PHENObarbital.  No orders of the defined types were placed in this encounter.    Follow-up: Return in about 3 weeks (around 09/05/2016).  Scarlette Calico, MD

## 2016-08-16 ENCOUNTER — Encounter: Payer: Self-pay | Admitting: Internal Medicine

## 2016-08-16 DIAGNOSIS — S22080A Wedge compression fracture of T11-T12 vertebra, initial encounter for closed fracture: Secondary | ICD-10-CM | POA: Insufficient documentation

## 2016-08-29 DIAGNOSIS — C139 Malignant neoplasm of hypopharynx, unspecified: Secondary | ICD-10-CM | POA: Diagnosis not present

## 2016-08-29 DIAGNOSIS — C029 Malignant neoplasm of tongue, unspecified: Secondary | ICD-10-CM | POA: Diagnosis not present

## 2016-08-29 DIAGNOSIS — C049 Malignant neoplasm of floor of mouth, unspecified: Secondary | ICD-10-CM | POA: Diagnosis not present

## 2016-08-29 DIAGNOSIS — B954 Other streptococcus as the cause of diseases classified elsewhere: Secondary | ICD-10-CM | POA: Diagnosis not present

## 2016-09-03 ENCOUNTER — Other Ambulatory Visit: Payer: Self-pay | Admitting: Internal Medicine

## 2016-09-21 DIAGNOSIS — M272 Inflammatory conditions of jaws: Secondary | ICD-10-CM | POA: Diagnosis not present

## 2016-09-21 DIAGNOSIS — C049 Malignant neoplasm of floor of mouth, unspecified: Secondary | ICD-10-CM | POA: Diagnosis not present

## 2016-09-23 ENCOUNTER — Other Ambulatory Visit: Payer: Self-pay | Admitting: Internal Medicine

## 2016-09-25 ENCOUNTER — Other Ambulatory Visit: Payer: Self-pay | Admitting: Internal Medicine

## 2016-10-10 ENCOUNTER — Other Ambulatory Visit: Payer: Self-pay | Admitting: *Deleted

## 2016-10-10 MED ORDER — SULFASALAZINE 500 MG PO TABS: 500.0000 mg | ORAL_TABLET | Freq: Three times a day (TID) | ORAL | 0 refills | Status: DC

## 2016-10-19 ENCOUNTER — Encounter: Payer: Self-pay | Admitting: Neurology

## 2016-10-19 ENCOUNTER — Ambulatory Visit (INDEPENDENT_AMBULATORY_CARE_PROVIDER_SITE_OTHER): Payer: PPO | Admitting: Neurology

## 2016-10-19 VITALS — BP 126/79 | HR 80 | Ht 64.0 in | Wt 110.5 lb

## 2016-10-19 DIAGNOSIS — E538 Deficiency of other specified B group vitamins: Secondary | ICD-10-CM | POA: Diagnosis not present

## 2016-10-19 DIAGNOSIS — R269 Unspecified abnormalities of gait and mobility: Secondary | ICD-10-CM | POA: Diagnosis not present

## 2016-10-19 DIAGNOSIS — R413 Other amnesia: Secondary | ICD-10-CM

## 2016-10-19 DIAGNOSIS — R569 Unspecified convulsions: Secondary | ICD-10-CM | POA: Diagnosis not present

## 2016-10-19 DIAGNOSIS — Z5181 Encounter for therapeutic drug level monitoring: Secondary | ICD-10-CM

## 2016-10-19 NOTE — Progress Notes (Signed)
Reason for visit: Seizures  [name] is an 76 y.o. female  History of present illness:  [name] is a 76 year old left-handed white female with a history of seizures. The patient has oropharyngeal cancer, she has had to have surgery on the right jaw for recurrent cancer. The patient has not had any seizures since last seen, the last seizure was in April 2014. The patient has a history of encephalomalacia affecting the left occipital parietal area. The patient has had ongoing problems with gait instability, she has had recent falls, she was in the emergency room for a fall in December 2017. She last fell about 2 weeks ago. The patient uses a walker for ambulation. 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 This has been falling for several years according to the husband. The patient has undergone physical therapy for her gait problem, but the patient would not continue the balance training exercises. She has been taking phenobarbital during the day which has resulted in some drowsiness. She also remains on Keppra 1500 mg twice daily. She returns for an evaluation.  Past Medical History:  Diagnosis Date  . 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 Anxiety   . Arthritis   . C. difficile colitis   . Cancer of larynx ([place])   . Cellulitis   . Cerebrovascular disease 2000  . Dementia   . Diverticulosis of colon (without mention of hemorrhage)   . Gait disorder   . H/O alcohol abuse   . History of UTI 10-2012  . Hyperlipidemia    takes Pravastatin daily  . Hypertension   . Hypothyroidism    takes Levothyroxine daily  . Joint pain   . Joint swelling   . Mild dysplasia of cervix   . Oropharyngeal cancer ([place])   . Peripheral vascular disease ([place])   . Permanent atrial fibrillation ([place])   . Personal history of colonic polyps    adenomatous 1997 & tubular adenoma and hyplastic  2008  . Pneumonia 2/16  . Redundant colon   . Seizures ([place])    takes  Phenobarbital and Keppra daily;last seizure was April 1,2014  . 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 Stroke ([place]) 2000   off balance on right side a little and peripheral vision loss on right side  . Subdural hematoma ([place]) 1990s   s/p fall in bathtub  . Ulcerative colitis    takes [place] daily  . Urinary incontinence   . UTI (lower urinary tract infection)     Past Surgical History:  Procedure Laterality Date  . BREAST LUMPECTOMY     left breast with radiation therapy  . CAROTID ENDARTERECTOMY     left  . CATARACT EXTRACTION, BILATERAL    . COLONOSCOPY    . FLEXIBLE SIGMOIDOSCOPY N/A 06/21/2013   Procedure: FLEXIBLE SIGMOIDOSCOPY;  Surgeon: Jerene Bears, MD;  Location: [facility];  Service: Gastroenterology;  Laterality: N/A;  . HAMMER TOE SURGERY Right 03/2013   pins and screws  . HARDWARE REMOVAL Right 11/14/2013   Procedure: RIGHT SHOUDER HARDWARE REMOVAL;  Surgeon: Nita Sells, MD;  Location: [facility];  Service: Orthopedics;  Laterality: Right;  . INCISION AND DRAINAGE Right 11/14/2013   Procedure: INCISION AND DRAINAGE;  Surgeon: Nita Sells, MD;  Location: [facility];  Service: Orthopedics;  Laterality: Right;  . LARYNX SURGERY  08/2010   [facility]  . MASTECTOMY     Right, history with nodule  dissection  . MULTIPLE TOOTH EXTRACTIONS     due to oral cancer  . Oral Surgery for squamous cell carcinoma of the mouth     x 3  . ORIF HIP FRACTURE  Sept '12   Right hip: screw and plate repair.  . ORIF HUMERUS FRACTURE Right 10/30/2012   Procedure: OPEN REDUCTION INTERNAL FIXATION (ORIF) HUMERAL SHAFT FRACTURE;  Surgeon: Hessie Dibble, MD;  Location: [facility];  Service: Orthopedics;  Laterality: Right;  BIG C-ARM, SHOULDER POSITION  . SHOULDER SURGERY  10/2013  . SUBDURAL HEMATOMA EVACUATION VIA CRANIOTOMY    . TUBAL LIGATION      Family History  Problem Relation Age of Onset  . Coronary artery disease Mother   . Heart disease Mother   . Diabetes  Sister   . Breast cancer Maternal Aunt   . Breast cancer Sister   . Colon cancer      nephew (sister's son)    Social history:  reports that she quit smoking about 11 years ago. She quit after 40.00 years of use. She has never used smokeless tobacco. She reports that she drinks alcohol. She reports that she does not use drugs.    Allergies  Allergen Reactions  . Xarelto [Rivaroxaban]     Suffered a severe bleed  . Diltiazem Other (See Comments)    edema    Medications:  Prior to Admission medications   Medication Sig Start Date End Date Taking? Authorizing Provider  acetylcysteine (MUCOMYST) 10 % nebulizer solution Take 1-2 mLs by nebulization 2 (two) times daily as needed (for thick secretions in trachea).   Yes Historical Provider, MD  aspirin EC 81 MG tablet Take 81 mg by mouth daily with breakfast.   Yes Historical Provider, MD  Calcium Carbonate-Vitamin D (CALCIUM 500 + D PO) Take 1 tablet by mouth daily.   Yes Historical Provider, MD  digoxin (LANOXIN) 0.125 MG tablet TAKE 1 TABLET (125 MCG TOTAL) BY MOUTH DAILY. 09/23/16  Yes Janith Lima, MD  levETIRAcetam (KEPPRA) 750 MG tablet Take 2 tablets (1,500 mg total) by mouth 2 (two) times daily. 10/19/15  Yes Kathrynn Ducking, MD  levothyroxine (SYNTHROID, LEVOTHROID) 50 MCG tablet TAKE 1 TABLET BY MOUTH ONCE EVERY EVENING 06/19/16  Yes Janith Lima, MD  PHENObarbital (LUMINAL) 100 MG tablet TAKE 1 TABLET BY MOUTH EVERY DAY AT BEDTIME 07/22/16  Yes Janith Lima, MD  pravastatin (PRAVACHOL) 40 MG tablet TAKE 1 TABLET (40 MG TOTAL) BY MOUTH EVERY EVENING. 06/19/16  Yes Janith Lima, MD  predniSONE (DELTASONE) 5 MG tablet Replaced with 20 mg oral daily for 2 weeks , then 15 mg oral daily for 2 weeks , then 10 mg oral daily for 2 weeks , then continue 5 mg oral daily thereafter . 05/21/16  Yes Dawood S Elgergawy, MD  predniSONE (DELTASONE) 5 MG tablet TAKE 1 TABLET (5 MG TOTAL) BY MOUTH DAILY WITH BREAKFAST. 09/26/16  Yes Jerene Bears, MD  sulfaSALAzine (AZULFIDINE) 500 MG tablet Take 1 tablet (500 mg total) by mouth 3 (three) times daily. Immediate release needed so can be crushed 10/10/16  Yes Jerene Bears, MD    ROS:  Out of a complete 14 system review of symptoms, the patient complains only of the following symptoms, and all other reviewed systems are negative.  Gait disturbance Memory disturbance History of seizures  Blood pressure 126/79, pulse 80, height 5\' 4"  (1.626 m), weight 110 lb 8 oz (50.1 kg).  Physical  Exam  General: The patient is alert and cooperative at the time of the examination.  Skin: No significant peripheral edema is noted.   Neurologic Exam  Mental status: The patient is alert and oriented x 3 at the time of the examination. The patient has apparent normal recent and remote memory, with an apparently normal attention span and concentration ability.   Cranial nerves: Facial symmetry is present. Speech is restricted, the patient is unable to talk. The extracted movements are full, the patient has a right homonymous visual field deficit.  Motor: The patient has good strength in all 4 extremities. The patient has incomplete abduction of the right arm.  Sensory examination: Soft touch sensation is symmetric on the face, arms, and legs.  Coordination: The patient has good finger-nose-finger and heel-to-shin bilaterally.  Gait and station: The patient has a wide-based gait, the patient has a circumduction gait with the right leg. Tandem gait was not attempted. Romberg is unsteady, the patient does not fall. No drift is seen.  Reflexes: Deep tendon reflexes are symmetric.   CT head and cervical spine 12/14/14:  IMPRESSION: 1. No evidence of acute intracranial abnormality. 2. Extensive chronic encephalomalacia in the left cerebral hemisphere. 3. No acute osseous abnormality in the cervical spine. 4. Moderate multilevel cervical disc and facet degeneration.  * CT scan images were  reviewed online. I agree with the written report.    Assessment/Plan:  1. History of seizures, well controlled  2. Chronic gait disorder, gradual progression  3. Memory disorder  The patient is having increased frequency of falls. The patient will switch the phenobarbital to the evening hours, she will continue the phenobarbital and Keppra. Blood work will be done today. She will follow-up in 6 months.  Jill Alexanders MD 10/19/2016 2:44 PM  [facility] [address] [place], [place] [address]  Phone [phone] Fax [phone]

## 2016-10-21 ENCOUNTER — Telehealth: Payer: Self-pay

## 2016-10-21 LAB — CBC WITH DIFFERENTIAL/PLATELET
BASOS ABS: 0 10*3/uL (ref 0.0–0.2)
Basos: 0 %
EOS (ABSOLUTE): 0.3 10*3/uL (ref 0.0–0.4)
Eos: 3 %
HEMOGLOBIN: 12.4 g/dL (ref 11.1–15.9)
Hematocrit: 36.9 % (ref 34.0–46.6)
Immature Grans (Abs): 0.1 10*3/uL (ref 0.0–0.1)
Immature Granulocytes: 1 %
LYMPHS ABS: 2 10*3/uL (ref 0.7–3.1)
Lymphs: 21 %
MCH: 31.1 pg (ref 26.6–33.0)
MCHC: 33.6 g/dL (ref 31.5–35.7)
MCV: 93 fL (ref 79–97)
MONOCYTES: 8 %
MONOS ABS: 0.8 10*3/uL (ref 0.1–0.9)
Neutrophils Absolute: 6.5 10*3/uL (ref 1.4–7.0)
Neutrophils: 67 %
Platelets: 206 10*3/uL (ref 150–379)
RBC: 3.99 x10E6/uL (ref 3.77–5.28)
RDW: 20.9 % — AB (ref 12.3–15.4)
WBC: 9.7 10*3/uL (ref 3.4–10.8)

## 2016-10-21 LAB — COMPREHENSIVE METABOLIC PANEL
A/G RATIO: 1.3 (ref 1.2–2.2)
ALBUMIN: 4.4 g/dL (ref 3.5–4.8)
ALT: 14 IU/L (ref 0–32)
AST: 19 IU/L (ref 0–40)
Alkaline Phosphatase: 131 IU/L — ABNORMAL HIGH (ref 39–117)
BILIRUBIN TOTAL: 0.6 mg/dL (ref 0.0–1.2)
BUN / CREAT RATIO: 23 (ref 12–28)
BUN: 14 mg/dL (ref 8–27)
CHLORIDE: 93 mmol/L — AB (ref 96–106)
CO2: 26 mmol/L (ref 18–29)
Calcium: 9.2 mg/dL (ref 8.7–10.3)
Creatinine, Ser: 0.62 mg/dL (ref 0.57–1.00)
GFR calc non Af Amer: 87 mL/min/{1.73_m2} (ref >59)
GFR, EST AFRICAN AMERICAN: 100 mL/min/{1.73_m2} (ref >59)
GLOBULIN, TOTAL: 3.5 g/dL (ref 1.5–4.5)
GLUCOSE: 102 mg/dL — AB (ref 65–99)
Potassium: 5.4 mmol/L — ABNORMAL HIGH (ref 3.5–5.2)
Sodium: 135 mmol/L (ref 134–144)
TOTAL PROTEIN: 7.9 g/dL (ref 6.0–8.5)

## 2016-10-21 LAB — LEVETIRACETAM LEVEL: Levetiracetam Lvl: 51.8 ug/mL — ABNORMAL HIGH (ref 10.0–40.0)

## 2016-10-21 LAB — RPR: RPR: NONREACTIVE

## 2016-10-21 LAB — PHENOBARBITAL LEVEL: PHENOBARBITAL, SERUM: 19 ug/mL (ref 15–40)

## 2016-10-21 LAB — VITAMIN B12: Vitamin B-12: 631 pg/mL (ref 232–1245)

## 2016-10-21 NOTE — Telephone Encounter (Signed)
Left message for pt to call back.  Pts husband had some medication left over that [name] is not taking anymore that is in blister packets, wanted to know if anyone could use these. Discussed with him he might want to contact [facility] to see if they can use them or know of anyone who might be able to take them. He thanked me for the call.

## 2016-10-23 ENCOUNTER — Telehealth: Payer: Self-pay | Admitting: Neurology

## 2016-10-23 NOTE — Telephone Encounter (Signed)
   I called the patient. The blood work showed a high keppra level, if changing the phenobarbital dosing to the evening does not help the drowsiness during the day, we will need to cut back on the keppra dose.   The potassium level was slightly high, chloride was slightly low and the alk phos levels was slightly high, we will follow.

## 2016-10-26 ENCOUNTER — Other Ambulatory Visit: Payer: Self-pay | Admitting: Internal Medicine

## 2016-10-26 ENCOUNTER — Telehealth: Payer: Self-pay | Admitting: Internal Medicine

## 2016-10-26 NOTE — Telephone Encounter (Signed)
Do you want patient to be on prednisone 5 mg daily or was she to discontinue?

## 2016-10-26 NOTE — Telephone Encounter (Signed)
Would continue the pred 5 mg daily for now Follow up in place

## 2016-10-26 NOTE — Telephone Encounter (Signed)
Dr Hilarie Fredrickson- Did you want patient to continue prednisone 5 mg long term or was she to discontinue?

## 2016-10-28 ENCOUNTER — Other Ambulatory Visit: Payer: Self-pay | Admitting: Internal Medicine

## 2016-10-28 NOTE — Telephone Encounter (Signed)
Again, this script was sent earlier for #30 with 1 refill. I have spoken to Clair Gulling at pharmacy to confirm that rx is awaiting patient pick up. I attempted to reach patient and her husband to advise but was unable to reach them. I have left a message advising that they may get rx this afternoon.

## 2016-10-28 NOTE — Telephone Encounter (Signed)
Pt's husband calling back regarding this states Prednisone has not been sent in to pharmacy and pt is out of the medication requesting a call back. Best call back # [phone].

## 2016-10-31 ENCOUNTER — Other Ambulatory Visit: Payer: Self-pay | Admitting: Internal Medicine

## 2016-11-16 ENCOUNTER — Other Ambulatory Visit: Payer: Self-pay | Admitting: Neurology

## 2016-11-21 DIAGNOSIS — C049 Malignant neoplasm of floor of mouth, unspecified: Secondary | ICD-10-CM | POA: Diagnosis not present

## 2016-11-21 DIAGNOSIS — C029 Malignant neoplasm of tongue, unspecified: Secondary | ICD-10-CM | POA: Diagnosis not present

## 2016-11-21 DIAGNOSIS — C329 Malignant neoplasm of larynx, unspecified: Secondary | ICD-10-CM | POA: Diagnosis not present

## 2016-11-21 DIAGNOSIS — C139 Malignant neoplasm of hypopharynx, unspecified: Secondary | ICD-10-CM | POA: Diagnosis not present

## 2016-12-05 ENCOUNTER — Encounter: Payer: Self-pay | Admitting: *Deleted

## 2016-12-11 ENCOUNTER — Other Ambulatory Visit: Payer: Self-pay | Admitting: Internal Medicine

## 2016-12-12 ENCOUNTER — Encounter: Payer: Self-pay | Admitting: Internal Medicine

## 2016-12-12 ENCOUNTER — Ambulatory Visit (INDEPENDENT_AMBULATORY_CARE_PROVIDER_SITE_OTHER): Payer: PPO | Admitting: Internal Medicine

## 2016-12-12 VITALS — BP 100/60 | HR 100 | Ht 64.0 in | Wt 105.2 lb

## 2016-12-12 DIAGNOSIS — K5190 Ulcerative colitis, unspecified, without complications: Secondary | ICD-10-CM

## 2016-12-12 MED ORDER — SULFASALAZINE 500 MG PO TABS: 500.0000 mg | ORAL_TABLET | Freq: Three times a day (TID) | ORAL | 1 refills | Status: AC

## 2016-12-12 MED ORDER — SULFASALAZINE 500 MG PO TABS: 1000.0000 mg | ORAL_TABLET | Freq: Two times a day (BID) | ORAL | 1 refills | Status: DC

## 2016-12-12 MED ORDER — PREDNISONE 5 MG PO TABS: ORAL_TABLET | ORAL | 5 refills | Status: DC

## 2016-12-12 MED ORDER — SULFASALAZINE 500 MG PO TABS: 500.0000 mg | ORAL_TABLET | Freq: Three times a day (TID) | ORAL | 1 refills | Status: DC

## 2016-12-12 NOTE — Progress Notes (Signed)
Subjective:    Patient ID: [name], female    DOB: 1940/08/06, 76 y.o.   MRN: [id number]  HPI [name] is a 76 year old female with history of ulcerative colitis, history of recurrent C. 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 She's here today with her husband. She was last seen in November 2017 by Nicoletta Ba, PA-C.  In January her mesalamine was switched to sulfasalazine initially 500 mg 3 times a day. Her husband reports that this medicine has worked incredibly well like a Agricultural consultant switch". She's had no further diarrhea. In fact stools have been formed to slightly firm. He had Benefiber about a month ago and she is having 1-2 soft and formed stools daily. No diarrhea. No abdominal pain. No blood in stool. No melena. No nausea, vomiting. She is eating a pured diet on an ongoing basis after her head and neck surgeries. Appetite has been good. She's lost 3-5 pounds in the past month. She also continues prednisone 5 mg daily. Her husband decreased her sulfasalazine to 500 mg twice a day which is working very well for her. Not needed any further antidiarrheals, including no Lomotil. No recent fevers, chills or night sweats. No recent falls  Review of Systems As per history of present illness, otherwise negative  Current Medications, Allergies, Past Medical History, Past Surgical History, Family History and Social History were reviewed in [employer] record.     Objective:   Physical Exam BP 100/60 (BP Location: Left Arm, Patient Position: Sitting, Cuff Size: Normal)   Pulse 100 Comment: irregular  Ht 5\' 4"  (1.626 m)   Wt 105 lb 4 oz (47.7 kg)   BMI 18.07 kg/m  Constitutional: Well-developed, Chronically ill-appearing female in no acute distress  HEENT:  Oropharynx is clear and moist,  with evidence of prior surgery. Conjunctivae are normal.  No scleral icterus. Tracheostomy stoma is covered Cardiovascular: Normal rate, regular rhythm and intact distal pulses. No M/R/G Pulmonary/chest: Effort normal and breath sounds normal. No wheezing, rales or rhonchi. Abdominal: Soft, nontender, nondistended. Bowel sounds active throughout.  Extremities: no clubbing, cyanosis, or edema Neurological: Alert and oriented to person place and time. Skin: Skin is warm and dry. Psychiatric: Normal mood and affect. Behavior is normal.     Assessment & Plan:  76 year old female with history of ulcerative colitis, history of recurrent C. 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   1. Ulcerative colitis -- doing very well on sulfasalazine, in fact much better than previous use of balsalazide or mesalamine. We'll continue sulfasalazine 500 mg twice a day to 3 times a day. Continue chronic prednisone 5 mg daily. We discussed weaning prednisone but given lack of other options including not wanting to add Biologics given her history of extensive head and neck cancer, we will continue prednisone 5 mg daily. Further surveillance/screening with colonoscopy is declined based on her overall medical condition at present. Both she and her husband agree with the recommendation to avoid colonoscopy.  2. Dysphagia -- chronic but tolerating pured diet. Follows closely with her head and neck oncologist at [facility]. Will continue to crush pills and eat. Diet  6-9 month follow-up, sooner if necessary 15 minutes spent with the patient today. Greater than 50% was spent in counseling  and coordination of care with the patient

## 2016-12-12 NOTE — Patient Instructions (Addendum)
We have sent the following medications to your pharmacy for you to pick up at your convenience: Sulfasalazine 500 mg twice daily Prednisone 5 mg  Follow up with Dr Hilarie Fredrickson in 6-9 months.  If you are age 78 or older, your body mass index should be between 23-30. Your Body mass index is 18.07 kg/m. If this is out of the aforementioned range listed, please consider follow up with your Primary Care Provider.  If you are age 32 or younger, your body mass index should be between 19-25. Your Body mass index is 18.07 kg/m. If this is out of the aformentioned range listed, please consider follow up with your Primary Care Provider.

## 2017-01-06 ENCOUNTER — Telehealth: Payer: Self-pay | Admitting: Internal Medicine

## 2017-01-06 MED ORDER — PREDNISONE 10 MG PO TABS: 10.0000 mg | ORAL_TABLET | Freq: Every day | ORAL | 0 refills | Status: DC

## 2017-01-06 NOTE — Telephone Encounter (Signed)
Mr. Tow states that Fraser Din started having diarrhea about 4-5 days ago. She has taken a few Imodium and this has helped some. There is no blood in the stool and no odor like with cdiff. Mr. Canny wants to know if prednisone should be increased. Currently taking prednisone 5mg  daily. Please advise.

## 2017-01-06 NOTE — Telephone Encounter (Signed)
Spoke with pts husband and he is aware. Script sent to pharmacy for Prednisone.

## 2017-01-06 NOTE — Telephone Encounter (Signed)
Would recommend 1st increasing sulfasalazine as her dose is relative low Would recommend 1 g three times daily Can increase prednisone back to 10 mg daily also If worsening then will need c diff test

## 2017-01-17 ENCOUNTER — Telehealth: Payer: Self-pay | Admitting: Internal Medicine

## 2017-01-17 NOTE — Telephone Encounter (Signed)
Check C diff PCR, stat Increase pred to 40 mg x 5 days, 30 mg x 5 day, 20 mg x 10 days, then back to 10 mg  Continue sulfasalazine

## 2017-01-17 NOTE — Telephone Encounter (Signed)
Pts husband states pt is having to run to the bathroom multiple times a day. Saturday she went 10 times. He states she feels the urge to go but no much is coming out. He is not seeing any blood but he is seeing mucous. Please advise.

## 2017-01-18 ENCOUNTER — Other Ambulatory Visit: Payer: Self-pay

## 2017-01-18 DIAGNOSIS — R197 Diarrhea, unspecified: Secondary | ICD-10-CM

## 2017-01-18 MED ORDER — PREDNISONE 10 MG PO TABS: ORAL_TABLET | ORAL | 0 refills | Status: DC

## 2017-01-18 NOTE — Telephone Encounter (Signed)
Spoke with pts husband and he is aware. Order in epic and script sent to pharmacy.

## 2017-01-19 ENCOUNTER — Other Ambulatory Visit: Payer: PPO

## 2017-01-19 DIAGNOSIS — R197 Diarrhea, unspecified: Secondary | ICD-10-CM

## 2017-01-20 ENCOUNTER — Other Ambulatory Visit: Payer: Self-pay | Admitting: Internal Medicine

## 2017-01-20 LAB — CLOSTRIDIUM DIFFICILE BY PCR: Toxigenic C. Difficile by PCR: NOT DETECTED

## 2017-01-23 ENCOUNTER — Other Ambulatory Visit: Payer: Self-pay | Admitting: Internal Medicine

## 2017-01-23 NOTE — Telephone Encounter (Signed)
Patient's spouse called in stating patient is out of phenobarbital.  Requesting script [facility] to [facility].   Looks like MD did sign off on script on 7/21.  Please follow up in regard.

## 2017-01-23 NOTE — Telephone Encounter (Signed)
Duplicate request rx already been approved and faxed [facility] to [facility].../lmb

## 2017-02-07 ENCOUNTER — Other Ambulatory Visit: Payer: Self-pay | Admitting: Internal Medicine

## 2017-02-13 ENCOUNTER — Other Ambulatory Visit: Payer: Self-pay | Admitting: Internal Medicine

## 2017-02-15 ENCOUNTER — Other Ambulatory Visit: Payer: Self-pay | Admitting: Internal Medicine

## 2017-02-15 ENCOUNTER — Telehealth: Payer: Self-pay | Admitting: Internal Medicine

## 2017-02-15 NOTE — Telephone Encounter (Signed)
This was completed and sent on 02/13/17. I have contacted [facility] who states that they never received our fax. Verbal order given.

## 2017-02-22 DIAGNOSIS — D0002 Carcinoma in situ of buccal mucosa: Secondary | ICD-10-CM | POA: Diagnosis not present

## 2017-02-22 DIAGNOSIS — K1370 Unspecified lesions of oral mucosa: Secondary | ICD-10-CM | POA: Diagnosis not present

## 2017-02-22 DIAGNOSIS — Z85818 Personal history of malignant neoplasm of other sites of lip, oral cavity, and pharynx: Secondary | ICD-10-CM | POA: Diagnosis not present

## 2017-02-22 DIAGNOSIS — C060 Malignant neoplasm of cheek mucosa: Secondary | ICD-10-CM | POA: Diagnosis not present

## 2017-02-22 DIAGNOSIS — Z8589 Personal history of malignant neoplasm of other organs and systems: Secondary | ICD-10-CM | POA: Diagnosis not present

## 2017-02-24 ENCOUNTER — Telehealth: Payer: Self-pay | Admitting: Internal Medicine

## 2017-02-24 DIAGNOSIS — R197 Diarrhea, unspecified: Secondary | ICD-10-CM

## 2017-02-24 NOTE — Telephone Encounter (Signed)
Patient's husband called to report that she is having diarrhea again.  She is currently on prednisone 10 mg ( stepped down 2 weeks ago), and lomotil BID.  See phone notes 01/17/17.  He reports that wife is having "runny BM"  4 times today.  The number of diarrhea episodes a day varies greatly.  The diarrhea has not changed despite the mg of prednisone she was on over the last 6 weeks.  Husband reports that she has a history of c-diff. And she was checked and it was negative prior to the initiation of prednisone.  Her husband  feels that the smell of her stools is consistent with previous c-diff infections. For the weekend patient's husband will increase the lomotil to the max of the prescription, 4 times a day.  He would like to get Dr. Vena Rua thoughts on this next week when he returns.    FYI - She saw specialist at [facility] on Wed and they found a large "Most likely cancerous place in her mouth".  Biopsy taken.

## 2017-02-27 NOTE — Telephone Encounter (Signed)
Would have C diff PCR stool study sent STAT Once submitted would begin vancomycin 125 mg PO QID x 14 days, empirically until results are available

## 2017-02-27 NOTE — Telephone Encounter (Signed)
Spoke with pts husband and he wants to wait on starting the vanc until stool results are back. States she has just now had her 1st diarrhea stool today. Order in epic.

## 2017-02-28 ENCOUNTER — Other Ambulatory Visit: Payer: PPO

## 2017-02-28 ENCOUNTER — Other Ambulatory Visit: Payer: Self-pay | Admitting: Internal Medicine

## 2017-02-28 DIAGNOSIS — R197 Diarrhea, unspecified: Secondary | ICD-10-CM | POA: Diagnosis not present

## 2017-03-01 ENCOUNTER — Telehealth: Payer: Self-pay | Admitting: Internal Medicine

## 2017-03-01 ENCOUNTER — Encounter: Payer: Self-pay | Admitting: Internal Medicine

## 2017-03-01 ENCOUNTER — Ambulatory Visit (INDEPENDENT_AMBULATORY_CARE_PROVIDER_SITE_OTHER): Payer: PPO | Admitting: Internal Medicine

## 2017-03-01 VITALS — BP 90/54 | HR 60 | Temp 97.9°F | Resp 20 | Ht 64.0 in | Wt 90.0 lb

## 2017-03-01 DIAGNOSIS — E039 Hypothyroidism, unspecified: Secondary | ICD-10-CM | POA: Diagnosis not present

## 2017-03-01 DIAGNOSIS — Z23 Encounter for immunization: Secondary | ICD-10-CM | POA: Diagnosis not present

## 2017-03-01 DIAGNOSIS — C060 Malignant neoplasm of cheek mucosa: Secondary | ICD-10-CM

## 2017-03-01 DIAGNOSIS — I10 Essential (primary) hypertension: Secondary | ICD-10-CM | POA: Diagnosis not present

## 2017-03-01 NOTE — Patient Instructions (Signed)
Hypothyroidism Hypothyroidism is a disorder of the thyroid. The thyroid is a large gland that is located in the lower front of the neck. The thyroid releases hormones that control how the body works. With hypothyroidism, the thyroid does not make enough of these hormones. What are the causes? Causes of hypothyroidism may include:  Viral infections.  Pregnancy.  Your own defense system (immune system) attacking your thyroid.  Certain medicines.  Birth defects.  Past radiation treatments to your head or neck.  Past treatment with radioactive iodine.  Past surgical removal of part or all of your thyroid.  Problems with the gland that is located in the center of your brain (pituitary).  What are the signs or symptoms? Signs and symptoms of hypothyroidism may include:  Feeling as though you have no energy (lethargy).  Inability to tolerate cold.  Weight gain that is not explained by a change in diet or exercise habits.  Dry skin.  Coarse hair.  Menstrual irregularity.  Slowing of thought processes.  Constipation.  Sadness or depression.  How is this diagnosed? Your health care provider may diagnose hypothyroidism with blood tests and ultrasound tests. How is this treated? Hypothyroidism is treated with medicine that replaces the hormones that your body does not make. After you begin treatment, it may take several weeks for symptoms to go away. Follow these instructions at home:  Take medicines only as directed by your health care provider.  If you start taking any new medicines, tell your health care provider.  Keep all follow-up visits as directed by your health care provider. This is important. As your condition improves, your dosage needs may change. You will need to have blood tests regularly so that your health care provider can watch your condition. Contact a health care provider if:  Your symptoms do not get better with treatment.  You are taking thyroid  replacement medicine and: ? You sweat excessively. ? You have tremors. ? You feel anxious. ? You lose weight rapidly. ? You cannot tolerate heat. ? You have emotional swings. ? You have diarrhea. ? You feel weak. Get help right away if:  You develop chest pain.  You develop an irregular heartbeat.  You develop a rapid heartbeat. This information is not intended to replace advice given to you by your health care provider. Make sure you discuss any questions you have with your health care provider. Document Released: 06/20/2005 Document Revised: 11/26/2015 Document Reviewed: 11/05/2013 Elsevier Interactive Patient Education  2017 [employer]vier Inc[employer]  

## 2017-03-01 NOTE — Telephone Encounter (Signed)
Pt's husband called. She has an appointment for a physical with Dr Ronnald Ramp today at 1:15pm. He asked to speak with Dr Ronnald Ramp or someone prior to the appointment. 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 The pt is not aware at this time. He said that this is her 9th cancer. They told him there there is nothing else they can do. If they were to operate they have have to remove half of her face because it is down in the bones. Mina Marble is going to call in Hospice. He wanted some advise and to be able to speak with someone prior to the appointment so that he could discuss this without her there. He can be reached at [phone].

## 2017-03-01 NOTE — Telephone Encounter (Signed)
Taken care of in office.

## 2017-03-01 NOTE — Progress Notes (Signed)
Subjective:  Patient ID: [name], female    DOB: 1940-12-14  Age: 76 y.o. MRN: [id number]  CC: Hypothyroidism and Hypertension   HPI RHYTHM GUBBELS presents for f/up - She recently saw her ENT doctor at [facility] and had another biopsy done of her right buccal mucosa. It revealed recurrent squamous cell carcinoma. Her ENT doctor apparently told the patient and her husband that she probably has cancer spreading throughout the right side of her face and that she cannot undergo any more surgery and is not a candidate for other treatment options. The patient and her husband are requesting that I send a referral for her to be enrolled in hospice care.  Outpatient Medications Prior to Visit  Medication Sig Dispense Refill  . aspirin EC 81 MG tablet Take 81 mg by mouth daily with breakfast.    . Calcium Carbonate-Vitamin D (CALCIUM 500 + D PO) Take 1 tablet by mouth daily.    . digoxin (LANOXIN) 0.125 MG tablet TAKE 1 TABLET (125 MCG TOTAL) BY MOUTH DAILY. 90 tablet 2  . diphenoxylate-atropine (LOMOTIL) 2.5-0.025 MG tablet TAKE 1 TABLET BY MOUTH 4 TIMES A DAY AS NEEDED FOR DIARRHEA 60 tablet 1  . levETIRAcetam (KEPPRA) 750 MG tablet TAKE 2 TABLETS (1,500 MG TOTAL) BY MOUTH 2 (TWO) TIMES DAILY. 360 tablet 3  . levothyroxine (SYNTHROID, LEVOTHROID) 50 MCG tablet TAKE 1 TABLET BY MOUTH ONCE EVERY EVENING 90 tablet 1  . PHENObarbital (LUMINAL) 100 MG tablet TAKE 1 TABLET BY MOUTH AT BEDTIME 90 tablet 1  . predniSONE (DELTASONE) 10 MG tablet TAKE 1 TABLET (10 MG TOTAL) BY MOUTH DAILY WITH BREAKFAST. 60 tablet 0  . sulfaSALAzine (AZULFIDINE) 500 MG tablet Take 1 tablet (500 mg total) by mouth 3 (three) times daily. Immediate release needed so can be crushed 270 tablet 1  . pravastatin (PRAVACHOL) 40 MG tablet TAKE 1 TABLET (40 MG TOTAL) BY MOUTH EVERY EVENING. 90 tablet 1  . predniSONE (DELTASONE) 10 MG tablet Take 4 pills by mouth daily for 5 days Take 3 pills by mouth daily for 5  days Take 2 pills by mouth daily for 10 days Take 1 pill daily 100 tablet 0  . predniSONE (DELTASONE) 5 MG tablet TAKE 1 TABLET (5 MG TOTAL) BY MOUTH DAILY WITH BREAKFAST. 30 tablet 5   No facility-administered medications prior to visit.     ROS Review of Systems  Constitutional: Positive for activity change, appetite change, fatigue and unexpected weight change.  HENT: Positive for drooling, trouble swallowing and voice change.   Respiratory: Positive for choking.   Cardiovascular: Positive for leg swelling. Negative for chest pain and palpitations.  Gastrointestinal: Positive for diarrhea. Negative for abdominal pain, blood in stool, nausea and vomiting.  Endocrine: Negative.   Genitourinary: Negative.  Negative for difficulty urinating.  Musculoskeletal: Positive for gait problem. Negative for back pain and myalgias.  Skin: Negative.   Allergic/Immunologic: Negative.   Neurological: Negative for dizziness and weakness.  Hematological: Negative for adenopathy. Does not bruise/bleed easily.  Psychiatric/Behavioral: Negative.     Objective:  BP (!) 90/54 (BP Location: Left Arm, Patient Position: Sitting, Cuff Size: Normal)   Pulse 60   Temp 97.9 F (36.6 C) (Oral)   Resp 20   Ht 5\' 4"  (1.626 m)   Wt 90 lb (40.8 kg)   SpO2 94%   BMI 15.45 kg/m   BP Readings from Last 3 Encounters:  03/01/17 (!) 90/54  12/12/16 100/60  10/19/16 126/79  Wt Readings from Last 3 Encounters:  03/01/17 90 lb (40.8 kg)  12/12/16 105 lb 4 oz (47.7 kg)  10/19/16 110 lb 8 oz (50.1 kg)    Physical Exam  Constitutional: She is oriented to person, place, and time. She appears cachectic.  Non-toxic appearance. She has a sickly appearance. She appears ill. No distress.  Right lower side of her face is malformed from prior surgeries. She is drooling and keeps a paper towel in her mouth to absorb the spit.  HENT:  Mouth/Throat: No oropharyngeal exudate, posterior oropharyngeal edema, posterior  oropharyngeal erythema or tonsillar abscesses.  Eyes: Conjunctivae are normal. Right eye exhibits no discharge. Left eye exhibits no discharge. No scleral icterus.  Neck: Normal range of motion. Neck supple. No thyromegaly present.  Cardiovascular: Normal rate, regular rhythm and intact distal pulses.  Exam reveals friction rub. Exam reveals no gallop.   No murmur heard. Pulmonary/Chest: Effort normal and breath sounds normal. No respiratory distress. She has no wheezes. She has no rales. She exhibits no tenderness.  Abdominal: Soft. Bowel sounds are normal. She exhibits no distension and no mass. There is no tenderness. There is no rebound and no guarding.  Musculoskeletal: She exhibits edema (1+ pitting edema over right ankle).  Lymphadenopathy:    She has no cervical adenopathy.  Neurological: She is alert and oriented to person, place, and time.  Skin: Skin is warm and dry. No rash noted. She is not diaphoretic. No erythema. No pallor.  Vitals reviewed.   Lab Results  Component Value Date   WBC 9.7 10/19/2016   HGB 12.4 10/19/2016   HCT 36.9 10/19/2016   PLT 206 10/19/2016   GLUCOSE 102 (H) 10/19/2016   CHOL 176 02/22/2016   TRIG 60.0 02/22/2016   HDL 92.70 02/22/2016   LDLCALC 71 02/22/2016   ALT 14 10/19/2016   AST 19 10/19/2016   NA 135 10/19/2016   K 5.4 (H) 10/19/2016   CL 93 (L) 10/19/2016   CREATININE 0.62 10/19/2016   BUN 14 10/19/2016   CO2 26 10/19/2016   TSH 1.44 02/22/2016   INR 1.17 05/17/2016   HGBA1C 5.6 02/22/2016    Dg Chest 2 View  Result Date: 08/15/2016 CLINICAL DATA:  Recent fall; left posterior rib pain since. Hx of rib fx's. Pt states more mucus from trach than normal. Hx of pna, breast, AFIB, HTN. Ex smoker. EXAM: CHEST - 2 VIEW COMPARISON:  11/ 14 2017 and previous FINDINGS: Lungs are clear. Heart size and mediastinal contours are within normal limits. Atheromatous aorta. No effusion. Surgical clips in both axilla. Chronic changes in the right  humeral head. Stable T9 compression deformity. New severe compression deformity at T12. IMPRESSION: 1. T12 compression fracture deformity, new  since 11/25/2015. 2. No acute cardiopulmonary disease. Electronically Signed   By: Lucrezia Europe M.D.   On: 08/15/2016 20:32    Assessment & Plan:   Maripaz was seen today for hypothyroidism and hypertension.  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 I will monitor her electrolytes and renal function. -     Basic metabolic panel; Future  Acquired hypothyroidism- I will monitor her her TSH level and will adjust the dose if indicated. -     TSH; Future  Need for influenza vaccination -     Flu vaccine HIGH DOSE PF (Fluzone High dose)  I have discontinued Ms. Pettway's pravastatin. I am also having her maintain her Calcium Carbonate-Vitamin D (CALCIUM 500 + D PO), aspirin EC, digoxin, levothyroxine, levETIRAcetam, sulfaSALAzine, PHENObarbital, predniSONE, and diphenoxylate-atropine.  No orders of the defined types were placed in this encounter.    Follow-up: Return if symptoms worsen or fail to improve.  Scarlette Calico, MD

## 2017-03-07 ENCOUNTER — Telehealth: Payer: Self-pay | Admitting: Internal Medicine

## 2017-03-07 LAB — SPECIMEN STATUS REPORT

## 2017-03-07 LAB — CLOSTRIDIUM DIFFICILE BY PCR: Toxigenic C. Difficile by PCR: NEGATIVE

## 2017-03-08 MED ORDER — DIPHENOXYLATE-ATROPINE 2.5-0.025 MG PO TABS: 2.0000 | ORAL_TABLET | Freq: Four times a day (QID) | ORAL | 0 refills | Status: AC | PRN

## 2017-03-08 MED ORDER — PREDNISONE 20 MG PO TABS: 20.0000 mg | ORAL_TABLET | Freq: Every day | ORAL | 0 refills | Status: AC

## 2017-03-08 NOTE — Telephone Encounter (Signed)
C diff was negative from 02/28/17, result on your desk. Pt is now under hospice care. Mr. [name] states that she is on prednisone 10mg  daily, he is giving her Lomotil 2-3 times/day, and she is on sulfasalazine 500mg  TID. Mr. [name] states she is making it to the bathroom about 6-8 times and then he is having to change her about 6-8 times a day. Husband wants to know what he can do for the diarrhea. Please advise.

## 2017-03-08 NOTE — Telephone Encounter (Signed)
Bump the pred to 20 mg daily Increase sulfasalazine to 1000 mg TID Can use lomotil 1-2 up to QID

## 2017-03-08 NOTE — Telephone Encounter (Signed)
Spoke with pts husband and he is aware. Scripts sent to pharmacy.

## 2017-03-27 ENCOUNTER — Telehealth: Payer: Self-pay | Admitting: Neurology

## 2017-03-27 ENCOUNTER — Telehealth: Payer: Self-pay | Admitting: Internal Medicine

## 2017-03-28 NOTE — Telephone Encounter (Signed)
FYI Dr Pyrtle  

## 2017-03-30 ENCOUNTER — Telehealth: Payer: Self-pay

## 2017-03-30 NOTE — Telephone Encounter (Signed)
On

## 2017-03-30 NOTE — Telephone Encounter (Signed)
On 03/30/17 I received a death certificate from [place] (original). The d/c is for burial. The patient is a patient of Doctor Ronnald Ramp. The d/c will be taken to [facility] tomorrow am for signature.  On 04-02-2017 I received the death certificate back from Pricilla Holm who signed the d/c for Doctor Ronnald Ramp. I got the death certificate ready and called the funeral home to let them know the d/c is ready for pickup.

## 2017-04-03 NOTE — Telephone Encounter (Signed)
Patient husband called to let Vaughan Basta and Dr.Pyrtle know pt past away today. FYI

## 2017-04-03 NOTE — Telephone Encounter (Signed)
Events noted

## 2017-04-03 NOTE — Telephone Encounter (Signed)
Patient's husband called to let Dr. Jannifer Franklin know patient passed away today.

## 2017-04-03 DEATH — deceased

## 2017-04-24 ENCOUNTER — Ambulatory Visit: Payer: PPO | Admitting: Adult Health

## 2017-12-20 IMAGING — DX DG CHEST 1V PORT
1 series · 1 of 1 positions shown · non-contrast
Comparison: 11/25/2015

CLINICAL DATA: One episode of bloody/mucus stool since waking this
morning.

EXAM:
PORTABLE CHEST 1 VIEW

[chest ap]
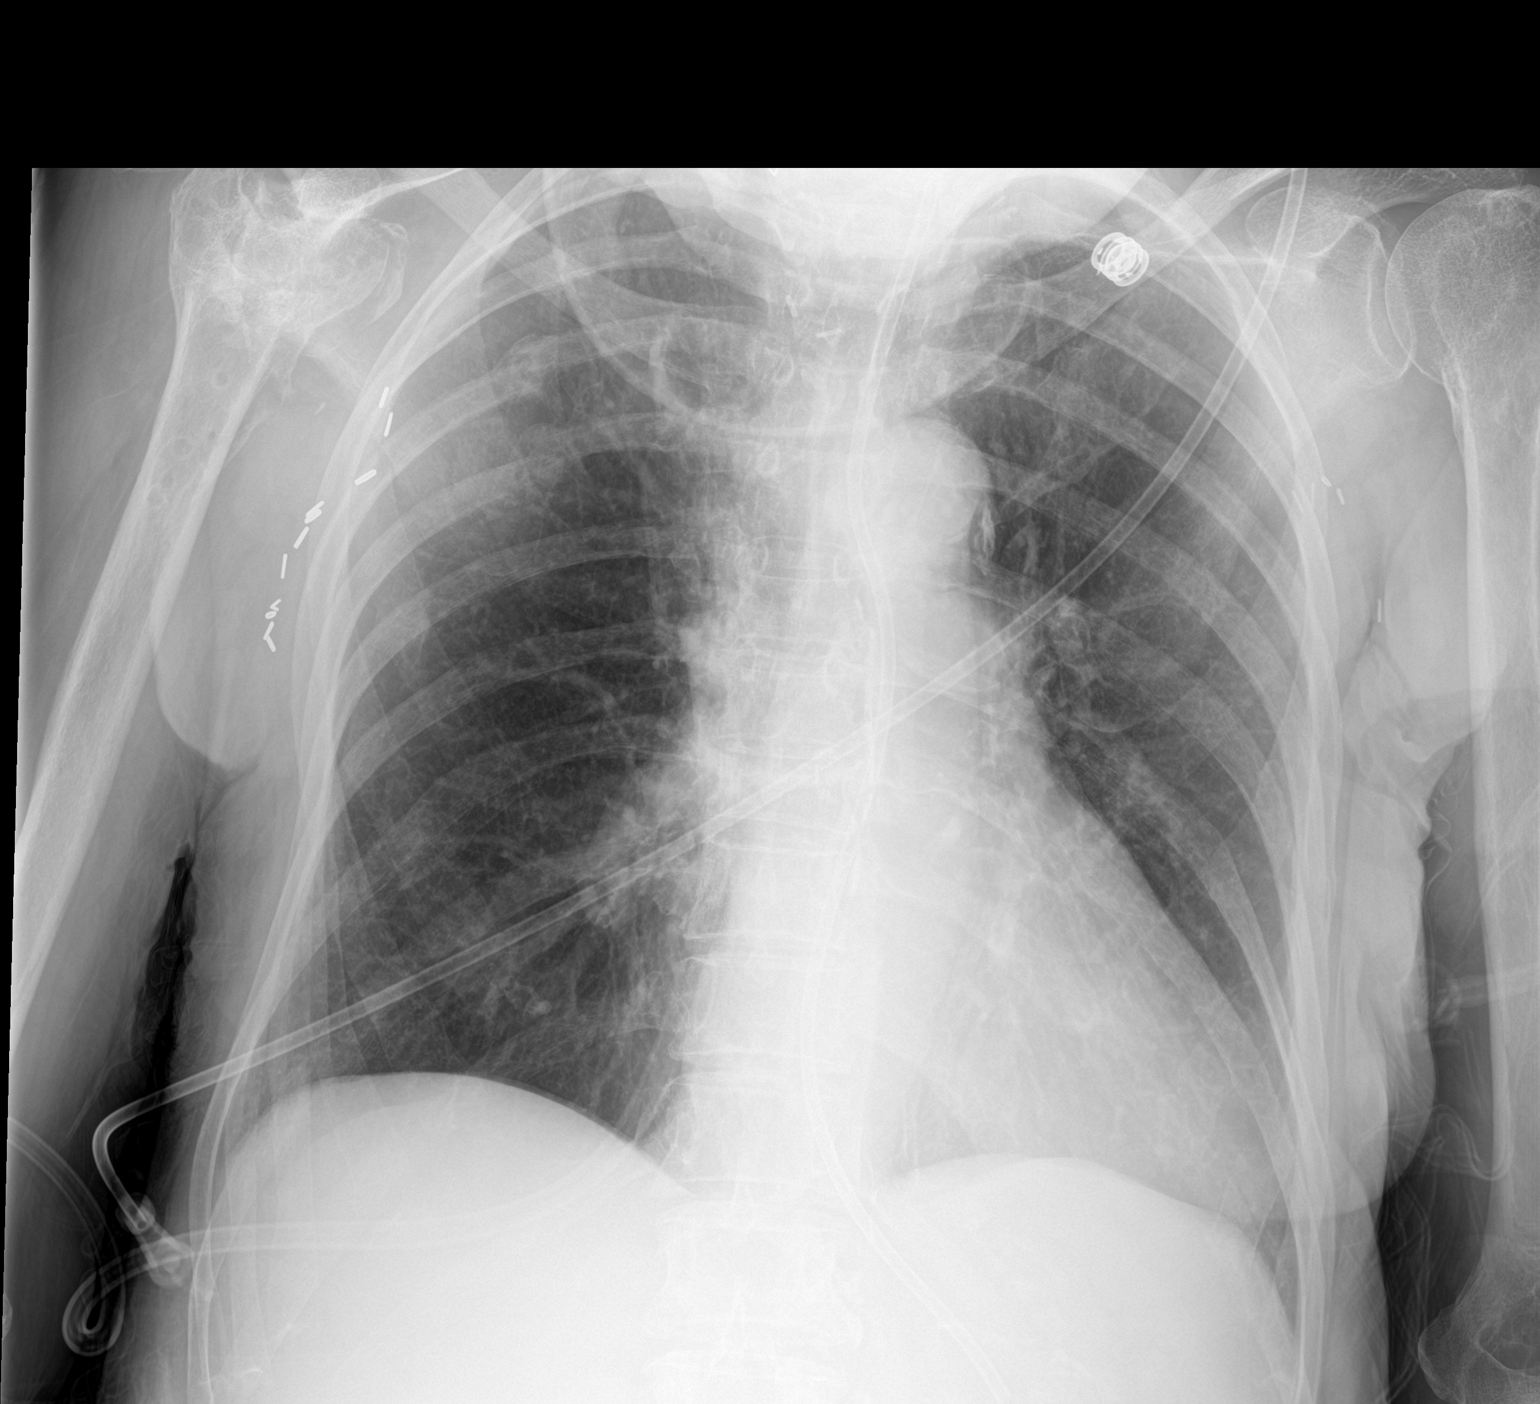

[1 of 1 positions shown; findings below may reference images not displayed]

FINDINGS: The cardiac silhouette is top-normal in size. There is aortic
atherosclerosis without aneurysm. Gastric/feeding tube extends below
the left hemidiaphragm. The lungs are slightly hyperinflated without
acute pneumonic consolidation, CHF nor effusion. Old healing
right-sided fifth and sixth rib fractures are noted posteriorly.
Bilateral axillary clips are identified with old posttraumatic
deformity of the right humeral head and ghost tracks along the
proximal right humeral shaft.
IMPRESSION: Slight hyperinflation of the lungs without acute pneumonic
consolidation or CHF.

Feeding tube/gastric tube extends below the left hemidiaphragm.

Healing right posterior fifth and sixth rib fractures. Old chronic
deformity of the right humeral head with ghost tracks in the
proximal humeral shaft.

## 2023-11-17 ENCOUNTER — Other Ambulatory Visit: Payer: Self-pay
